# Patient Record
Sex: Female | Born: 1950
Health system: Southern US, Community
[De-identification: ages and names within clinical notes are randomized; demographics above are authoritative.]

## PROBLEM LIST (undated history)

## (undated) DIAGNOSIS — N289 Disorder of kidney and ureter, unspecified: Secondary | ICD-10-CM

## (undated) DIAGNOSIS — I4891 Unspecified atrial fibrillation: Secondary | ICD-10-CM

## (undated) DIAGNOSIS — C801 Malignant (primary) neoplasm, unspecified: Secondary | ICD-10-CM

## (undated) DIAGNOSIS — E7800 Pure hypercholesterolemia, unspecified: Secondary | ICD-10-CM

## (undated) DIAGNOSIS — I509 Heart failure, unspecified: Secondary | ICD-10-CM

## (undated) DIAGNOSIS — I10 Essential (primary) hypertension: Secondary | ICD-10-CM

## (undated) DIAGNOSIS — I2089 Other forms of angina pectoris: Secondary | ICD-10-CM

## (undated) DIAGNOSIS — I82409 Acute embolism and thrombosis of unspecified deep veins of unspecified lower extremity: Secondary | ICD-10-CM

## (undated) DIAGNOSIS — E119 Type 2 diabetes mellitus without complications: Secondary | ICD-10-CM

## (undated) DIAGNOSIS — I208 Other forms of angina pectoris: Secondary | ICD-10-CM

## (undated) DIAGNOSIS — I2699 Other pulmonary embolism without acute cor pulmonale: Secondary | ICD-10-CM

## (undated) DIAGNOSIS — I639 Cerebral infarction, unspecified: Secondary | ICD-10-CM

## (undated) HISTORY — DX: Acute embolism and thrombosis of unspecified deep veins of unspecified lower extremity: I82.409

## (undated) HISTORY — DX: Pure hypercholesterolemia, unspecified: E78.00

## (undated) HISTORY — DX: Other forms of angina pectoris: I20.8

## (undated) HISTORY — DX: Other forms of angina pectoris: I20.89

## (undated) HISTORY — PX: BREAST SURGERY: SHX581

## (undated) HISTORY — PX: PARTIAL HYSTERECTOMY: SHX80

## (undated) HISTORY — DX: Cerebral infarction, unspecified: I63.9

## (undated) HISTORY — DX: Unspecified atrial fibrillation: I48.91

## (undated) HISTORY — DX: Other pulmonary embolism without acute cor pulmonale: I26.99

---

## 2015-04-12 HISTORY — PX: MASTECTOMY: SHX3

## 2016-04-23 ENCOUNTER — Encounter (HOSPITAL_COMMUNITY): Payer: Self-pay

## 2016-04-23 DIAGNOSIS — Z79899 Other long term (current) drug therapy: Secondary | ICD-10-CM

## 2016-04-23 DIAGNOSIS — E1165 Type 2 diabetes mellitus with hyperglycemia: Secondary | ICD-10-CM | POA: Diagnosis present

## 2016-04-23 DIAGNOSIS — Z9010 Acquired absence of unspecified breast and nipple: Secondary | ICD-10-CM

## 2016-04-23 DIAGNOSIS — I509 Heart failure, unspecified: Secondary | ICD-10-CM | POA: Diagnosis present

## 2016-04-23 DIAGNOSIS — I130 Hypertensive heart and chronic kidney disease with heart failure and stage 1 through stage 4 chronic kidney disease, or unspecified chronic kidney disease: Secondary | ICD-10-CM | POA: Diagnosis present

## 2016-04-23 DIAGNOSIS — I82401 Acute embolism and thrombosis of unspecified deep veins of right lower extremity: Secondary | ICD-10-CM | POA: Diagnosis present

## 2016-04-23 DIAGNOSIS — I2602 Saddle embolus of pulmonary artery with acute cor pulmonale: Principal | ICD-10-CM | POA: Diagnosis present

## 2016-04-23 DIAGNOSIS — Z7982 Long term (current) use of aspirin: Secondary | ICD-10-CM

## 2016-04-23 DIAGNOSIS — N189 Chronic kidney disease, unspecified: Secondary | ICD-10-CM | POA: Diagnosis present

## 2016-04-23 DIAGNOSIS — R000 Tachycardia, unspecified: Secondary | ICD-10-CM | POA: Diagnosis present

## 2016-04-23 DIAGNOSIS — Z9889 Other specified postprocedural states: Secondary | ICD-10-CM

## 2016-04-23 DIAGNOSIS — E871 Hypo-osmolality and hyponatremia: Secondary | ICD-10-CM | POA: Diagnosis present

## 2016-04-23 DIAGNOSIS — Z8249 Family history of ischemic heart disease and other diseases of the circulatory system: Secondary | ICD-10-CM

## 2016-04-23 DIAGNOSIS — Z9221 Personal history of antineoplastic chemotherapy: Secondary | ICD-10-CM

## 2016-04-23 DIAGNOSIS — E1122 Type 2 diabetes mellitus with diabetic chronic kidney disease: Secondary | ICD-10-CM | POA: Diagnosis present

## 2016-04-23 DIAGNOSIS — E785 Hyperlipidemia, unspecified: Secondary | ICD-10-CM | POA: Diagnosis present

## 2016-04-23 DIAGNOSIS — Z853 Personal history of malignant neoplasm of breast: Secondary | ICD-10-CM

## 2016-04-23 NOTE — ED Triage Notes (Signed)
Pt states that several months ago she had breast cancer and recent mastectomy and chemo treatments. Pt was recently in [place] last week pt states that she had loss of function on the r side of her body and has not started to gain that back and today her daughter picked her up from airport today and pt having difficulty speaking. Neuro intact.

## 2016-04-24 ENCOUNTER — Emergency Department (HOSPITAL_COMMUNITY): Payer: Medicare Other

## 2016-04-24 ENCOUNTER — Encounter (HOSPITAL_COMMUNITY): Payer: Self-pay | Admitting: Internal Medicine

## 2016-04-24 ENCOUNTER — Other Ambulatory Visit (HOSPITAL_COMMUNITY): Payer: Self-pay

## 2016-04-24 ENCOUNTER — Inpatient Hospital Stay (HOSPITAL_COMMUNITY): Payer: Medicare Other

## 2016-04-24 ENCOUNTER — Inpatient Hospital Stay (HOSPITAL_COMMUNITY): Payer: Self-pay

## 2016-04-24 ENCOUNTER — Inpatient Hospital Stay (HOSPITAL_COMMUNITY)
Admission: EM | Admit: 2016-04-24 | Discharge: 2016-05-03 | DRG: 175 | Disposition: A | Discharge status: Home / Self Care | Payer: Medicare Other | Attending: Internal Medicine | Admitting: Internal Medicine

## 2016-04-24 DIAGNOSIS — R29810 Facial weakness: Secondary | ICD-10-CM | POA: Diagnosis not present

## 2016-04-24 DIAGNOSIS — L8990 Pressure ulcer of unspecified site, unspecified stage: Secondary | ICD-10-CM | POA: Insufficient documentation

## 2016-04-24 DIAGNOSIS — R05 Cough: Secondary | ICD-10-CM | POA: Diagnosis not present

## 2016-04-24 DIAGNOSIS — R4781 Slurred speech: Secondary | ICD-10-CM | POA: Diagnosis not present

## 2016-04-24 DIAGNOSIS — Z9010 Acquired absence of unspecified breast and nipple: Secondary | ICD-10-CM | POA: Diagnosis not present

## 2016-04-24 DIAGNOSIS — R000 Tachycardia, unspecified: Secondary | ICD-10-CM | POA: Diagnosis not present

## 2016-04-24 DIAGNOSIS — I130 Hypertensive heart and chronic kidney disease with heart failure and stage 1 through stage 4 chronic kidney disease, or unspecified chronic kidney disease: Secondary | ICD-10-CM | POA: Diagnosis not present

## 2016-04-24 DIAGNOSIS — E785 Hyperlipidemia, unspecified: Secondary | ICD-10-CM | POA: Diagnosis not present

## 2016-04-24 DIAGNOSIS — I82401 Acute embolism and thrombosis of unspecified deep veins of right lower extremity: Secondary | ICD-10-CM | POA: Diagnosis not present

## 2016-04-24 DIAGNOSIS — E1165 Type 2 diabetes mellitus with hyperglycemia: Secondary | ICD-10-CM | POA: Diagnosis not present

## 2016-04-24 DIAGNOSIS — R2990 Unspecified symptoms and signs involving the nervous system: Secondary | ICD-10-CM | POA: Diagnosis not present

## 2016-04-24 DIAGNOSIS — I2692 Saddle embolus of pulmonary artery without acute cor pulmonale: Secondary | ICD-10-CM | POA: Diagnosis not present

## 2016-04-24 DIAGNOSIS — Z853 Personal history of malignant neoplasm of breast: Secondary | ICD-10-CM

## 2016-04-24 DIAGNOSIS — E1122 Type 2 diabetes mellitus with diabetic chronic kidney disease: Secondary | ICD-10-CM | POA: Diagnosis not present

## 2016-04-24 DIAGNOSIS — I639 Cerebral infarction, unspecified: Secondary | ICD-10-CM | POA: Diagnosis present

## 2016-04-24 DIAGNOSIS — Z9221 Personal history of antineoplastic chemotherapy: Secondary | ICD-10-CM | POA: Diagnosis not present

## 2016-04-24 DIAGNOSIS — N189 Chronic kidney disease, unspecified: Secondary | ICD-10-CM | POA: Diagnosis not present

## 2016-04-24 DIAGNOSIS — Z9889 Other specified postprocedural states: Secondary | ICD-10-CM | POA: Diagnosis not present

## 2016-04-24 DIAGNOSIS — I509 Heart failure, unspecified: Secondary | ICD-10-CM | POA: Diagnosis not present

## 2016-04-24 DIAGNOSIS — Z7982 Long term (current) use of aspirin: Secondary | ICD-10-CM | POA: Diagnosis not present

## 2016-04-24 DIAGNOSIS — E871 Hypo-osmolality and hyponatremia: Secondary | ICD-10-CM | POA: Diagnosis not present

## 2016-04-24 DIAGNOSIS — Z8249 Family history of ischemic heart disease and other diseases of the circulatory system: Secondary | ICD-10-CM | POA: Diagnosis not present

## 2016-04-24 DIAGNOSIS — J189 Pneumonia, unspecified organism: Secondary | ICD-10-CM

## 2016-04-24 DIAGNOSIS — E119 Type 2 diabetes mellitus without complications: Secondary | ICD-10-CM | POA: Diagnosis not present

## 2016-04-24 DIAGNOSIS — I2699 Other pulmonary embolism without acute cor pulmonale: Secondary | ICD-10-CM | POA: Diagnosis not present

## 2016-04-24 DIAGNOSIS — I2602 Saddle embolus of pulmonary artery with acute cor pulmonale: Secondary | ICD-10-CM | POA: Diagnosis not present

## 2016-04-24 DIAGNOSIS — I6789 Other cerebrovascular disease: Secondary | ICD-10-CM | POA: Diagnosis not present

## 2016-04-24 DIAGNOSIS — Z79899 Other long term (current) drug therapy: Secondary | ICD-10-CM | POA: Diagnosis not present

## 2016-04-24 HISTORY — DX: Malignant (primary) neoplasm, unspecified: C80.1

## 2016-04-24 HISTORY — DX: Disorder of kidney and ureter, unspecified: N28.9

## 2016-04-24 HISTORY — DX: Heart failure, unspecified: I50.9

## 2016-04-24 HISTORY — DX: Type 2 diabetes mellitus without complications: E11.9

## 2016-04-24 LAB — ECHOCARDIOGRAM COMPLETE BUBBLE STUDY
HEIGHTINCHES: 63 in
WEIGHTICAEL: 3118.4 [oz_av]

## 2016-04-24 LAB — I-STAT CHEM 8, ED
BUN: 14 mg/dL (ref 6–20)
CHLORIDE: 101 mmol/L (ref 101–111)
Calcium, Ion: 1.24 mmol/L (ref 1.15–1.40)
Creatinine, Ser: 0.9 mg/dL (ref 0.44–1.00)
Glucose, Bld: 334 mg/dL — ABNORMAL HIGH (ref 65–99)
HEMATOCRIT: 30 % — AB (ref 36.0–46.0)
Hemoglobin: 10.2 g/dL — ABNORMAL LOW (ref 12.0–15.0)
POTASSIUM: 3.9 mmol/L (ref 3.5–5.1)
SODIUM: 130 mmol/L — AB (ref 135–145)
TCO2: 19 mmol/L (ref 0–100)

## 2016-04-24 LAB — GLUCOSE, CAPILLARY
GLUCOSE-CAPILLARY: 210 mg/dL — AB (ref 65–99)
GLUCOSE-CAPILLARY: 263 mg/dL — AB (ref 65–99)
Glucose-Capillary: 190 mg/dL — ABNORMAL HIGH (ref 65–99)
Glucose-Capillary: 175 mg/dL — ABNORMAL HIGH (ref 65–99)
Glucose-Capillary: 156 mg/dL — ABNORMAL HIGH (ref 65–99)

## 2016-04-24 LAB — COMPREHENSIVE METABOLIC PANEL
ALT: 22 U/L (ref 14–54)
AST: 30 U/L (ref 15–41)
Albumin: 2.7 g/dL — ABNORMAL LOW (ref 3.5–5.0)
Alkaline Phosphatase: 83 U/L (ref 38–126)
Anion gap: 11 (ref 5–15)
BUN: 14 mg/dL (ref 6–20)
CHLORIDE: 100 mmol/L — AB (ref 101–111)
CO2: 19 mmol/L — AB (ref 22–32)
CREATININE: 1.06 mg/dL — AB (ref 0.44–1.00)
Calcium: 9.5 mg/dL (ref 8.9–10.3)
GFR calc Af Amer: >60 mL/min (ref >60)
GFR, EST NON AFRICAN AMERICAN: 53 mL/min — AB (ref >60)
Glucose, Bld: 324 mg/dL — ABNORMAL HIGH (ref 65–99)
Potassium: 4 mmol/L (ref 3.5–5.1)
SODIUM: 130 mmol/L — AB (ref 135–145)
Total Bilirubin: 0.6 mg/dL (ref 0.3–1.2)
Total Protein: 6.8 g/dL (ref 6.5–8.1)

## 2016-04-24 LAB — DIFFERENTIAL
BASOS ABS: 0 10*3/uL (ref 0.0–0.1)
BASOS PCT: 0 %
Eosinophils Absolute: 0.1 10*3/uL (ref 0.0–0.7)
Eosinophils Relative: 1 %
LYMPHS PCT: 16 %
Lymphs Abs: 1.4 10*3/uL (ref 0.7–4.0)
MONOS PCT: 11 %
Monocytes Absolute: 1 10*3/uL (ref 0.1–1.0)
NEUTROS ABS: 6.3 10*3/uL (ref 1.7–7.7)
Neutrophils Relative %: 72 %

## 2016-04-24 LAB — APTT: APTT: 40 s — AB (ref 24–36)

## 2016-04-24 LAB — CBC
HEMATOCRIT: 29.3 % — AB (ref 36.0–46.0)
Hemoglobin: 10.1 g/dL — ABNORMAL LOW (ref 12.0–15.0)
MCH: 30.8 pg (ref 26.0–34.0)
MCHC: 34.5 g/dL (ref 30.0–36.0)
MCV: 89.3 fL (ref 78.0–100.0)
Platelets: 216 10*3/uL (ref 150–400)
RBC: 3.28 MIL/uL — AB (ref 3.87–5.11)
RDW: 15.5 % (ref 11.5–15.5)
WBC: 8.7 10*3/uL (ref 4.0–10.5)

## 2016-04-24 LAB — MRSA PCR SCREENING: MRSA BY PCR: NEGATIVE

## 2016-04-24 LAB — CBG MONITORING, ED: Glucose-Capillary: 300 mg/dL — ABNORMAL HIGH (ref 65–99)

## 2016-04-24 LAB — PROTIME-INR
INR: 1.21
Prothrombin Time: 15.4 seconds — ABNORMAL HIGH (ref 11.4–15.2)

## 2016-04-24 LAB — I-STAT TROPONIN, ED: Troponin i, poc: 0.25 ng/mL (ref 0.00–0.08)

## 2016-04-24 MED ORDER — SODIUM CHLORIDE 0.9 % IV BOLUS (SEPSIS)
500.0000 mL | Freq: Once | INTRAVENOUS | Status: AC
  Administered 2016-04-24: 500 mL via INTRAVENOUS

## 2016-04-24 MED ORDER — ACETAMINOPHEN 650 MG RE SUPP: 650.0000 mg | RECTAL | Status: DC | PRN

## 2016-04-24 MED ORDER — VANCOMYCIN HCL 10 G IV SOLR
1500.0000 mg | Freq: Once | INTRAVENOUS | Status: AC
  Administered 2016-04-24: 1500 mg via INTRAVENOUS
  Filled 2016-04-24: qty 1500

## 2016-04-24 MED ORDER — ASPIRIN 81 MG PO CHEW
324.0000 mg | CHEWABLE_TABLET | Freq: Once | ORAL | Status: AC
  Administered 2016-04-24: 324 mg via ORAL
  Filled 2016-04-24: qty 4

## 2016-04-24 MED ORDER — ENOXAPARIN SODIUM 100 MG/ML ~~LOC~~ SOLN
90.0000 mg | Freq: Once | SUBCUTANEOUS | Status: AC
  Administered 2016-04-24: 90 mg via SUBCUTANEOUS
  Filled 2016-04-24: qty 1

## 2016-04-24 MED ORDER — STROKE: EARLY STAGES OF RECOVERY BOOK
Freq: Once | Status: AC
  Administered 2016-04-30: 18:00:00
  Filled 2016-04-24: qty 1

## 2016-04-24 MED ORDER — IOPAMIDOL (ISOVUE-370) INJECTION 76%
INTRAVENOUS | Status: AC
  Administered 2016-04-24: 75 mL
  Filled 2016-04-24: qty 100

## 2016-04-24 MED ORDER — WHITE PETROLATUM GEL
Status: AC
  Filled 2016-04-24: qty 1

## 2016-04-24 MED ORDER — ACETAMINOPHEN 325 MG PO TABS
650.0000 mg | ORAL_TABLET | ORAL | Status: DC | PRN
  Filled 2016-04-24: qty 2

## 2016-04-24 MED ORDER — PIPERACILLIN-TAZOBACTAM 3.375 G IVPB 30 MIN
3.3750 g | Freq: Once | INTRAVENOUS | Status: AC
  Administered 2016-04-24: 3.375 g via INTRAVENOUS
  Filled 2016-04-24: qty 50

## 2016-04-24 MED ORDER — INSULIN ASPART 100 UNIT/ML ~~LOC~~ SOLN
0.0000 [IU] | SUBCUTANEOUS | Status: DC
Start: 2016-04-24 — End: 2016-05-02
  Administered 2016-04-24 (×3): 2 [IU] via SUBCUTANEOUS
  Administered 2016-04-24: 5 [IU] via SUBCUTANEOUS
  Administered 2016-04-25: 3 [IU] via SUBCUTANEOUS
  Administered 2016-04-25: 7 [IU] via SUBCUTANEOUS
  Administered 2016-04-25: 1 [IU] via SUBCUTANEOUS
  Administered 2016-04-25: 3 [IU] via SUBCUTANEOUS
  Administered 2016-04-26 (×2): 2 [IU] via SUBCUTANEOUS
  Administered 2016-04-26 – 2016-04-27 (×3): 1 [IU] via SUBCUTANEOUS
  Administered 2016-04-27 (×2): 2 [IU] via SUBCUTANEOUS
  Administered 2016-04-27: 3 [IU] via SUBCUTANEOUS
  Administered 2016-04-27: 5 [IU] via SUBCUTANEOUS
  Administered 2016-04-28: 1 [IU] via SUBCUTANEOUS
  Administered 2016-04-28: 2 [IU] via SUBCUTANEOUS
  Administered 2016-04-28: 5 [IU] via SUBCUTANEOUS
  Administered 2016-04-28: 1 [IU] via SUBCUTANEOUS
  Administered 2016-04-28: 3 [IU] via SUBCUTANEOUS
  Administered 2016-04-28: 2 [IU] via SUBCUTANEOUS
  Administered 2016-04-28: 3 [IU] via SUBCUTANEOUS
  Administered 2016-04-29 (×2): 1 [IU] via SUBCUTANEOUS
  Administered 2016-04-29: 3 [IU] via SUBCUTANEOUS
  Administered 2016-04-29: 2 [IU] via SUBCUTANEOUS
  Administered 2016-04-30: 1 [IU] via SUBCUTANEOUS
  Administered 2016-04-30: 2 [IU] via SUBCUTANEOUS
  Administered 2016-04-30 – 2016-05-01 (×3): 1 [IU] via SUBCUTANEOUS

## 2016-04-24 MED ORDER — ACETAMINOPHEN 160 MG/5ML PO SOLN: 650.0000 mg | ORAL | Status: DC | PRN

## 2016-04-24 MED ORDER — SODIUM CHLORIDE 0.9 % IV SOLN
INTRAVENOUS | Status: DC
Start: 2016-04-24 — End: 2016-04-25
  Administered 2016-04-24 (×2): via INTRAVENOUS

## 2016-04-24 MED ORDER — ENOXAPARIN SODIUM 100 MG/ML ~~LOC~~ SOLN
90.0000 mg | Freq: Two times a day (BID) | SUBCUTANEOUS | Status: DC
  Administered 2016-04-24 – 2016-04-27 (×6): 90 mg via SUBCUTANEOUS
  Filled 2016-04-24 (×7): qty 1

## 2016-04-24 NOTE — Consult Note (Signed)
PULMONARY / CRITICAL CARE MEDICINE   Name: Carrie Watts MRN: [id number] DOB: 06-19-1950    ADMISSION DATE:  04/24/2016 CONSULTATION DATE:  04/24/2016  REFERRING MD:  Alcario Drought  CHIEF COMPLAINT:  Dyspnea  HISTORY OF PRESENT ILLNESS:   66 y/o female came to the [facility] ED on 1/14 with cough, dyspnea and chest pain in the setting of two weeks of R sided facial droop and slurred speech after returning from [place] on 1/13.  Noted to have a large PE on CT angiogram so PCCM was consulted for further evaluation.  She is being evaluated by neurology for a possible stroke.  She has received lovenox for the PE.  Neurology has ordered an MRI of the brain.   She said that her dyspnea started about 2 weeks prior to admission.  She had some chills and a cough with mucus production, but no fever.  She currently feels OK, but is still coughing. She has breast cancer and has been receiving chemotherapy recently.  PAST MEDICAL HISTORY :  She  has a past medical history of Cancer [facility]); CHF (congestive heart failure) ([place]); Diabetes mellitus without complication ([place]); and Renal disorder.  PAST SURGICAL HISTORY: She  has a past surgical history that includes Breast surgery.  Allergies  Allergen Reactions  . Dust Mite Extract     No current facility-administered medications on file prior to encounter.    No current outpatient prescriptions on file prior to encounter.    FAMILY HISTORY:  Her indicated that the status of her mother is unknown. She indicated that the status of her neg hx is unknown.    SOCIAL HISTORY: She  reports that she has never smoked. She has never used smokeless tobacco. She reports that she does not drink alcohol.  REVIEW OF SYSTEMS:   Gen: Denies fever, chills, weight change, fatigue, night sweats HEENT: Denies blurred vision, double vision, hearing loss, tinnitus, sinus congestion, rhinorrhea, sore throat, neck stiffness, dysphagia PULM: per HPI CV: Denies chest pain, edema,  orthopnea, paroxysmal nocturnal dyspnea, palpitations GI: Denies abdominal pain, nausea, vomiting, diarrhea, hematochezia, melena, constipation, change in bowel habits GU: Denies dysuria, hematuria, polyuria, oliguria, urethral discharge Endocrine: Denies hot or cold intolerance, polyuria, polyphagia or appetite change Derm: Denies rash, dry skin, scaling or peeling skin change Heme: Denies easy bruising, bleeding, bleeding gums Neuro: Denies headache, numbness, weakness, slurred speech, loss of memory or consciousness   SUBJECTIVE:  As above  VITAL SIGNS: BP (!) 138/104 (BP Location: Right Leg)   Pulse (!) 104   Temp 98 F (36.7 C) (Oral)   Resp (!) 29   Ht 5\' 3"  (1.6 m)   Wt 88.4 kg (194 lb 14.4 oz)   SpO2 93%   BMI 34.52 kg/m   HEMODYNAMICS:    VENTILATOR SETTINGS:    INTAKE / OUTPUT: I/O last 3 completed shifts: In: 526.3 [I.V.:26.3; IV Piggyback:500] Out: 200 [Urine:200]  PHYSICAL EXAMINATION: General:  Resting comfortably Neuro:  Awake, alert, moves L more than R HEENT:  NCAT, OP clear Cardiovascular:  RRR, no mgr Lungs:  CTA B, normal effort Abdomen:  BS+, soft, nontender Musculoskeletal:  Normal bulk and tone Skin:  No rash or skin breakdown  LABS:  BMET  Recent Labs Lab 04/24/16 0007 04/24/16 0027  NA 130* 130*  K 4.0 3.9  CL 100* 101  CO2 19*  --   BUN 14 14  CREATININE 1.06* 0.90  GLUCOSE 324* 334*    Electrolytes  Recent Labs Lab 04/24/16 0007  CALCIUM 9.5    CBC  Recent Labs Lab 04/24/16 0007 04/24/16 0027  WBC 8.7  --   HGB 10.1* 10.2*  HCT 29.3* 30.0*  PLT 216  --     Coag's  Recent Labs Lab 04/24/16 0007  APTT 40*  INR 1.21    Sepsis Markers No results for input(s): LATICACIDVEN, PROCALCITON, O2SATVEN in the last 168 hours.  ABG No results for input(s): PHART, PCO2ART, PO2ART in the last 168 hours.  Liver Enzymes  Recent Labs Lab 04/24/16 0007  AST 30  ALT 22  ALKPHOS 83  BILITOT 0.6  ALBUMIN  2.7*    Cardiac Enzymes No results for input(s): TROPONINI, PROBNP in the last 168 hours.  Glucose  Recent Labs Lab 04/24/16 0323 04/24/16 0643 04/24/16 0831  GLUCAP 300* 210* 190*    Imaging Dg Chest 2 View  Result Date: 04/24/2016 CLINICAL DATA:  Cough. History of hypertension and CHF. History of breast cancer. Patient has had loss of function on the right side over body and has difficulty speaking. EXAM: CHEST  2 VIEW COMPARISON:  None. FINDINGS: Normal heart size and pulmonary vascularity. No focal airspace disease or consolidation in the lungs. No blunting of costophrenic angles. No pneumothorax. Mediastinal contours appear intact. Surgical clips in the left axilla. Degenerative changes in the spine. IMPRESSION: No active cardiopulmonary disease. Electronically Signed   By: Lucienne Capers M.D.   On: 04/24/2016 01:33   Ct Head Wo Contrast  Result Date: 04/24/2016 CLINICAL DATA:  Acute onset of slurred speech and bilateral leg weakness. Initial encounter. EXAM: CT HEAD WITHOUT CONTRAST TECHNIQUE: Contiguous axial images were obtained from the base of the skull through the vertex without intravenous contrast. COMPARISON:  None. FINDINGS: Brain: No evidence of acute infarction, hemorrhage, hydrocephalus, extra-axial collection or mass lesion/mass effect. Prominence of the ventricles and sulci reflects mild cortical volume loss. Scattered periventricular subcortical white matter change likely reflects small vessel ischemic microangiopathy. A small chronic lacunar infarct is noted at the right basal ganglia. The brainstem and fourth ventricle are within normal limits. The cerebral hemispheres demonstrate grossly normal gray-white differentiation. No mass effect or midline shift is seen. Vascular: No hyperdense vessel or unexpected calcification. Skull: There is no evidence of fracture; visualized osseous structures are unremarkable in appearance. Sinuses/Orbits: The orbits are within normal  limits. The paranasal sinuses and mastoid air cells are well-aerated. Other: No significant soft tissue abnormalities are seen. IMPRESSION: 1. No acute intracranial pathology seen on CT. 2. Mild cortical volume loss and scattered small vessel ischemic microangiopathy. 3. Small chronic lacunar infarct at the right basal ganglia. Electronically Signed   By: Garald Balding M.D.   On: 04/24/2016 01:54   Ct Angio Chest Pe W And/or Wo Contrast  Result Date: 04/24/2016 CLINICAL DATA:  Cough, chest pain, and shortness of breath for 2 weeks. History of breast cancer post left mastectomy. Chemotherapy. Multiple recent strokes. Diabetes. EXAM: CT ANGIOGRAPHY CHEST WITH CONTRAST TECHNIQUE: Multidetector CT imaging of the chest was performed using the standard protocol during bolus administration of intravenous contrast. Multiplanar CT image reconstructions and MIPs were obtained to evaluate the vascular anatomy. CONTRAST:  100 mL Isovue 370 COMPARISON:  None. FINDINGS: Cardiovascular: Filling defects demonstrated in the main and bilateral lobar pulmonary artery is extending into bilateral upper and lower lobe branches consistent with saddle embolus with moderately large clot burden. The RV to LV ratio is elevated at 1.19, suggesting risk of right heart strain. No pericardial effusion. Dilated ascending thoracic aorta at  3.5 cm. No dissection. Scattered calcification. Mediastinum/Nodes: Small esophageal hiatal hernia. Esophagus is decompressed. No significant lymphadenopathy in the chest. Lungs/Pleura: Evaluation is limited due to motion artifact. There patchy nodular ground-glass infiltrative changes demonstrated centrally in the lungs, in the lung bases, and scattered throughout the upper lungs. This may be due to multifocal pneumonia, edema, or infarcts. Metastatic disease would be a less likely consideration. No pleural effusions. No pneumothorax. Upper Abdomen: No acute abnormality. Musculoskeletal: Postoperative  changes consistent with left mastectomy and surgical clips in the left axilla. No destructive bone lesions. Review of the MIP images confirms the above findings. IMPRESSION: Positive examination for pulmonary embolus, including saddle embolus and bilateral lobar and segmental pulmonary emboli. Moderately large clot burden. Positive for acute PE with CT evidence of right heart strain (RV/LV Ratio = 1.19) consistent with at least submassive (intermediate risk) PE. The presence of right heart strain has been associated with an increased risk of morbidity and mortality. Please activate Code PE by paging [phone]. Patchy nodular ground-glass infiltrative changes in the lungs may be due to multifocal pneumonia, edema, or infarcts. Metastatic disease would be a less likely consideration but due to history of primary carcinoma, non-contrast chest CT at 3-6 months is recommended. If nodules persist, subsequent management will be based upon the most suspicious nodule(s). This recommendation follows the consensus statement: Guidelines for Management of Incidental Pulmonary Nodules Detected on CT Images: From the Fleischner Society 2017; Radiology 2017; 284:228-243. These results were called by telephone at the time of interpretation on 04/24/2016 at 4:26 am to Dr. Deno Etienne , who verbally acknowledged these results. Electronically Signed   By: Lucienne Capers M.D.   On: 04/24/2016 04:32     STUDIES:  CT chest 1/14> CT angiogram> large PE, bilateral, patchy ground glass bilaterally  CULTURES:   ANTIBIOTICS:   SIGNIFICANT EVENTS:   LINES/TUBES:   DISCUSSION: 66 y/o female with breast cancer admitted with a PE on 1/14 and findings worrisome for a stroke.  Both of these appear to be sub acute as she has had symptoms for about 2-3 weeks.  Concern exists for paradoxical embolism, PFO.  ASSESSMENT / PLAN:  PULMONARY A: Sub-acute pulmonary embolism No evidence of right heart strain, so no role for  thrombolysis P:   Agree with lovenox for anticoagulation in setting of breast cancer assuming the stroke team doesn't feel this increases risk of cerebral hemorrhage excessively Will need treatment for 3-6 months Agree with echocardiogram F/u with heme-onc  PCCM will sign off   Roselie Awkward, MD [place] P[facility]M Pager: [phone] Cell: [phone] After 3pm or if no response, call [phone]  04/24/2016, 9:07 AM

## 2016-04-24 NOTE — ED Notes (Signed)
Notified nurse first results from istat troponin. 

## 2016-04-24 NOTE — Progress Notes (Signed)
eLink Physician-Brief Progress Note Patient Name: Carrie Watts DOB: 08-08-1950 MRN: [id number]   Date of Service  04/24/2016  HPI/Events of Note  66 year old African female with diagnosis of breast cancer post mastectomy and chemotherapy some months ago. Presented to the ED with difficulty speaking after being picked up from [place]. Patient also endorsed chest discomfort, cough, and shortness of breath. Recently had traveled to [place]. ED physician denies any syncope. Patient has normal saturation on room air. Currently normotensive and not tachycardic or tachypneic. Troponin I mildly elevated at 0.25. RV/LV ratio 1.19. Significant clot burden with saddle embolus and filling defects with an main and bilateral lobar pulmonary  arteries.  Patient being evaluated by neurology.  eICU Interventions  1. Recommended admission to hospitalist service 2. Recommend therapeutic anticoagulation with heparin drip once evaluated by neurology 3. Recommend trending troponin I 4. Recommend complete echocardiogram with contrast imaging 5. Recommend telemetry monitoring and continuous pulse oximetry 6. Pulmonologist will evaluate the patient later this morning/today     Intervention Category Evaluation Type: New Patient Evaluation  Tera Partridge 04/24/2016, 4:50 AM

## 2016-04-24 NOTE — ED Notes (Signed)
Positive troponin, to go to room next.

## 2016-04-24 NOTE — Progress Notes (Signed)
STROKE TEAM PROGRESS NOTE   HISTORY OF PRESENT ILLNESS (per record) Carrie Watts is an 66 y.o. female who presents with a one week history of right sided weakness. Her symptoms began last Monday while she was in [place] receiving chemotherapy treatments for her breast cancer. She states that her daughter noticed difficulty speaking when she picked the patient up from the airport on Saturday. The patient denies vision changes, headache, neck pain, chest pain, abdominal pain or limb pain; however, on exam she states that movement of her lower extremities is painful. She gives short answers to questions lacking in detail and there is difficulty in obtaining a detailed description of neurological symptoms and history from her. She stated to me that she was in [place] but informed the ED attending that she was recently in [place].   CT head was negative. CTA of chest demonstrated a large clot burden PE and saddle PE, as well as ground glass areas that radiology feels may be PNA versus infarct versus malignancy spread, per report.   Her PMHx includes breast CA, CHF and DM.    SUBJECTIVE (INTERVAL HISTORY) No family is at the bedside.  Overall she feels her condition is stable. She stated that had speech difficulty for more than a week. However, her speech more of stuttering instead of aphasia or slurry. Her RUE and RLE muscle strength symmetrical to the left. MRI and MRA pending. Found to have saddle PE and on lovenox injection.    OBJECTIVE Temp:  [97.8 F (36.6 C)-99.5 F (37.5 C)] 98 F (36.7 C) (01/14 0829) Pulse Rate:  [78-112] 78 (01/14 1000) Cardiac Rhythm: Normal sinus rhythm (01/14 0800) Resp:  [15-29] 18 (01/14 1000) BP: (101-157)/(48-104) 120/65 (01/14 1000) SpO2:  [88 %-100 %] 93 % (01/14 1000) Weight:  [194 lb 14.4 oz (88.4 kg)] 194 lb 14.4 oz (88.4 kg) (01/14 0603)  CBC:   Recent Labs Lab 04/24/16 0007 04/24/16 0027  WBC 8.7  --   NEUTROABS 6.3  --   HGB 10.1* 10.2*   HCT 29.3* 30.0*  MCV 89.3  --   PLT 216  --     Basic Metabolic Panel:   Recent Labs Lab 04/24/16 0007 04/24/16 0027  NA 130* 130*  K 4.0 3.9  CL 100* 101  CO2 19*  --   GLUCOSE 324* 334*  BUN 14 14  CREATININE 1.06* 0.90  CALCIUM 9.5  --     Lipid Panel: No results found for: CHOL, TRIG, HDL, CHOLHDL, VLDL, LDLCALC HgbA1c: No results found for: HGBA1C Urine Drug Screen: No results found for: LABOPIA, COCAINSCRNUR, LABBENZ, AMPHETMU, THCU, LABBARB    IMAGING I have personally reviewed the radiological images below and agree with the radiology interpretations.  Dg Chest 2 View 04/24/2016 No active cardiopulmonary disease.   Ct Head Wo Contrast 04/24/2016 1. No acute intracranial pathology seen on CT.  2. Mild cortical volume loss and scattered small vessel ischemic microangiopathy.  3. Small chronic lacunar infarct at the right basal ganglia.   Ct Angio Chest Pe W And/or Wo Contrast 04/24/2016 Positive examination for pulmonary embolus, including saddle embolus and bilateral lobar and segmental pulmonary emboli. Moderately large clot burden. Positive for acute PE with CT evidence of right heart strain (RV/LV Ratio = 1.19) consistent with at least submassive (intermediate risk) PE. The presence of right heart strain has been associated with an increased risk of morbidity and mortality. Please activate Code PE by paging [phone].  Patchy nodular ground-glass infiltrative changes in the  lungs may be due to multifocal pneumonia, edema, or infarcts. Metastatic disease would be a less likely consideration but due to history of primary carcinoma, non-contrast chest CT at 3-6 months is recommended.  If nodules persist, subsequent management will be based upon the most suspicious nodule(s).   MRI / MRA Head  - pending  TCD bubble study pending  CUS pending  TTE pending  LE venous doppler pending   PHYSICAL EXAM  Temp:  [97.8 F (36.6 C)-99.5 F (37.5 C)] 98 F  (36.7 C) (01/14 0829) Pulse Rate:  [78-112] 78 (01/14 1000) Resp:  [15-29] 18 (01/14 1000) BP: (101-157)/(48-104) 120/65 (01/14 1000) SpO2:  [88 %-100 %] 93 % (01/14 1000) Weight:  [194 lb 14.4 oz (88.4 kg)] 194 lb 14.4 oz (88.4 kg) (01/14 0603)  General - Well nourished, well developed, in no apparent distress.  Ophthalmologic - Fundi not visualized due to eye movement.  Cardiovascular - Regular rate and rhythm.  Mental Status -  Level of arousal and orientation to time, place, and person were intact. Language including naming, repetition, comprehension was assessed and found intact, however, significant stuttering speech, concerning for functional speech instead of aphasia. Fund of Knowledge was assessed and was intact.  Cranial Nerves II - XII - II - Visual field intact OU. III, IV, VI - Extraocular movements intact. V - Facial sensation intact bilaterally. VII - Facial movement intact bilaterally. VIII - Hearing & vestibular intact bilaterally. X - Palate elevates symmetrically. XI - Chin turning & shoulder shrug intact bilaterally. XII - Tongue protrusion intact.  Motor Strength - The patient's strength was 4/5 and symmetrical in all extremities and pronator drift was absent.  Bulk was normal and fasciculations were absent.   Motor Tone - Muscle tone was assessed at the neck and appendages and was normal.  Reflexes - The patient's reflexes were 1+ in all extremities and she had no pathological reflexes.  Sensory - Light touch, temperature/pinprick were assessed and were symmetrical.    Coordination - The patient had normal movements in the hands with no ataxia or dysmetria, but slow on movement.  Tremor was absent.  Gait and Station - deferred.   ASSESSMENT/PLAN Ms. Carrie Watts is a 66 y.o. female with history of breast CA, CHF, CKD, and DM  presenting with one week history of right sided weakness and speech difficulties.. She did not receive IV t-PA due to late  presentation.  PE with saddle embolus and right heart strain - ? related to long flight travel vs. Hypercoagulable state secondary to breast cancer  CTA chest showed saddle embolus and bilateral lobar and segmental pulmonary emboli as well as right heart strain  LE venous doppler pending  On lovenox therapeutic dose  Close neuro check   If any neuro changes, consider stat head CT without contrast  Other treatment per primary team  Stroke to rule out - speech more stuttering and no weakness found at this time  MRI - pending  MRA - pending  Carotid Doppler - pending  2D Echo - pending  TCD bubble study pending  LDL - pending  HgbA1c - pending  VTE prophylaxis - Lovenox DIET DYS 3 Room service appropriate? Yes; Fluid consistency: Thin  aspirin 81 mg daily prior to admission, now on Lovenox full dose.   Close neuro check - if neuro changes, stat head CT  Therapy recommendations: pending  Disposition:  Pending  Hypertension  Stable  Permissive hypertension (OK if < 180/105) but gradually normalize in  5-7 days  Long-term BP goal normotensive  Hyperlipidemia  Home meds: No lipid lowering medications PTA  LDL pending, goal < 70  Continue statin at discharge  Diabetes  HgbA1c - pending, goal < 7.0  Uncontrolled  Hyperglycemia  SSI  CBG monitoring  Other Stroke Risk Factors  Advanced age  Obesity, Body mass index is 34.52 kg/m., recommend weight loss, diet and exercise as appropriate   Hx stroke/TIA - right BG old infarct by imaging  Other Active Problems  Family history of clotting disorder  Anemia - 10.2 / 30  hyponatremia - 130  Breast cancer - s/p mastectomy and undergoing chemotherapy  To rule out metastatic disease on chest CT - F/U non contrast chest CT in 3 - 6 months.  Hospital day # 0  This patient is critically ill due to large saddle PE, possible stroke and DVT, DM, hyperglycemia and at significant risk of neurological  worsening, death form heart failure, respiratory failure, hemorrhagic stroke, recurrent stroke, DKA, and seizure. This patient's care requires constant monitoring of vital signs, hemodynamics, respiratory and cardiac monitoring, review of multiple databases, neurological assessment, discussion with family, other specialists and medical decision making of high complexity. I spent 40 minutes of neurocritical care time in the care of this patient.  Rosalin Hawking, MD PhD Stroke Neurology 04/24/2016 10:42 AM   To contact Stroke Continuity provider, please refer to [email or web address] After hours, contact General Neurology

## 2016-04-24 NOTE — H&P (Addendum)
History and Physical    Carrie Watts EHO:[id number] DOB: 28-Jun-1950 DOA: 04/24/2016   PCP: No primary care provider on file. Chief Complaint:  Chief Complaint  Patient presents with  . difficulty speaking    HPI: Carrie Watts is a 67 y.o. female with medical history significant of BRCA of the left breast s/p lumpectomy and chemo.  Patient just flew back to the [place] from [place] yesterday.  She presents to the ED with c/o R sided facial droop, difficulty speaking, chest discomfort, cough, and SOB.  Symptoms onset about 2ish weeks ago, have been persistent since onset, nothing makes them better or worse.  ED Course: CT head negative.  Trop of 0.25 which convinced EDP to get a CTA PE study which demonstrates large clot burden PE and saddle PE!  Also shows ground glass areas that the radiologist hedges may be PNA, may be infarct, or less likely might be malignancy spread given her h/o BRCA and so she needs repeat CT scan in 3-6 months.  Review of Systems: As per HPI otherwise 10 point review of systems negative.    Past Medical History:  Diagnosis Date  . Cancer (HCC)    breast  . CHF (congestive heart failure) ([place])   . Diabetes mellitus without complication ([place])   . Renal disorder     Past Surgical History:  Procedure Laterality Date  . BREAST SURGERY       reports that she has never smoked. She has never used smokeless tobacco. She reports that she does not drink alcohol. Her drug history is not on file.  Allergies  Allergen Reactions  . Dust Mite Extract     Family History  Problem Relation Age of Onset  . Heart attack Mother   . Clotting disorder Neg Hx       Prior to Admission medications   Medication Sig Start Date End Date Taking? Authorizing Provider  aspirin EC 81 MG tablet Take 81 mg by mouth daily.   Yes Historical Provider, MD  omeprazole (PRILOSEC) 20 MG capsule Take 20 mg by mouth daily.   Yes Historical Provider, MD  PRESCRIPTION MEDICATION Take 1  tablet by mouth daily. Blood Pressure   Yes Historical Provider, MD    Physical Exam: Vitals:   04/24/16 0415 04/24/16 0430 04/24/16 0445 04/24/16 0500  BP: 125/67 106/56 139/69   Pulse: 83 82 80   Resp: '15 19 20   '$ Temp:      TempSrc:      SpO2: (!) 88% 92% 95%   Height:    '5\' 2"'$  (1.575 m)      Constitutional: NAD, calm, comfortable Eyes: PERRL, lids and conjunctivae normal ENMT: Mucous membranes are moist. Posterior pharynx clear of any exudate or lesions.Normal dentition.  Neck: normal, supple, no masses, no thyromegaly Respiratory: clear to auscultation bilaterally, no wheezing, no crackles. Normal respiratory effort. No accessory muscle use.  Cardiovascular: Regular rate and rhythm, no murmurs / rubs / gallops. No extremity edema. 2+ pedal pulses. No carotid bruits.  Abdomen: no tenderness, no masses palpated. No hepatosplenomegaly. Bowel sounds positive.  Musculoskeletal: no clubbing / cyanosis. No joint deformity upper and lower extremities. Good ROM, no contractures. Normal muscle tone.  Skin: no rashes, lesions, ulcers. No induration Neurologic: Patient with findings suspicious for broca's aphasia.  LLE weakness, R facial droop Psychiatric: Normal judgment and insight. Alert and oriented x 3. Normal mood.    Labs on Admission: I have personally reviewed following labs and imaging studies  CBC:  Recent Labs Lab 04/24/16 0007 04/24/16 0027  WBC 8.7  --   NEUTROABS 6.3  --   HGB 10.1* 10.2*  HCT 29.3* 30.0*  MCV 89.3  --   PLT 216  --    Basic Metabolic Panel:  Recent Labs Lab 04/24/16 0007 04/24/16 0027  NA 130* 130*  K 4.0 3.9  CL 100* 101  CO2 19*  --   GLUCOSE 324* 334*  BUN 14 14  CREATININE 1.06* 0.90  CALCIUM 9.5  --    GFR: CrCl cannot be calculated (Unknown ideal weight.). Liver Function Tests:  Recent Labs Lab 04/24/16 0007  AST 30  ALT 22  ALKPHOS 83  BILITOT 0.6  PROT 6.8  ALBUMIN 2.7*   No results for input(s): LIPASE,  AMYLASE in the last 168 hours. No results for input(s): AMMONIA in the last 168 hours. Coagulation Profile:  Recent Labs Lab 04/24/16 0007  INR 1.21   Cardiac Enzymes: No results for input(s): CKTOTAL, CKMB, CKMBINDEX, TROPONINI in the last 168 hours. BNP (last 3 results) No results for input(s): PROBNP in the last 8760 hours. HbA1C: No results for input(s): HGBA1C in the last 72 hours. CBG:  Recent Labs Lab 04/24/16 0323  GLUCAP 300*   Lipid Profile: No results for input(s): CHOL, HDL, LDLCALC, TRIG, CHOLHDL, LDLDIRECT in the last 72 hours. Thyroid Function Tests: No results for input(s): TSH, T4TOTAL, FREET4, T3FREE, THYROIDAB in the last 72 hours. Anemia Panel: No results for input(s): VITAMINB12, FOLATE, FERRITIN, TIBC, IRON, RETICCTPCT in the last 72 hours. Urine analysis: No results found for: COLORURINE, APPEARANCEUR, LABSPEC, PHURINE, GLUCOSEU, HGBUR, BILIRUBINUR, KETONESUR, PROTEINUR, UROBILINOGEN, NITRITE, LEUKOCYTESUR Sepsis Labs: '@LABRCNTIP'$ (procalcitonin:4,lacticidven:4) )No results found for this or any previous visit (from the past 240 hour(s)).   Radiological Exams on Admission: Dg Chest 2 View  Result Date: 04/24/2016 CLINICAL DATA:  Cough. History of hypertension and CHF. History of breast cancer. Patient has had loss of function on the right side over body and has difficulty speaking. EXAM: CHEST  2 VIEW COMPARISON:  None. FINDINGS: Normal heart size and pulmonary vascularity. No focal airspace disease or consolidation in the lungs. No blunting of costophrenic angles. No pneumothorax. Mediastinal contours appear intact. Surgical clips in the left axilla. Degenerative changes in the spine. IMPRESSION: No active cardiopulmonary disease. Electronically Signed   By: Lucienne Capers M.D.   On: 04/24/2016 01:33   Ct Head Wo Contrast  Result Date: 04/24/2016 CLINICAL DATA:  Acute onset of slurred speech and bilateral leg weakness. Initial encounter. EXAM: CT HEAD  WITHOUT CONTRAST TECHNIQUE: Contiguous axial images were obtained from the base of the skull through the vertex without intravenous contrast. COMPARISON:  None. FINDINGS: Brain: No evidence of acute infarction, hemorrhage, hydrocephalus, extra-axial collection or mass lesion/mass effect. Prominence of the ventricles and sulci reflects mild cortical volume loss. Scattered periventricular subcortical white matter change likely reflects small vessel ischemic microangiopathy. A small chronic lacunar infarct is noted at the right basal ganglia. The brainstem and fourth ventricle are within normal limits. The cerebral hemispheres demonstrate grossly normal gray-white differentiation. No mass effect or midline shift is seen. Vascular: No hyperdense vessel or unexpected calcification. Skull: There is no evidence of fracture; visualized osseous structures are unremarkable in appearance. Sinuses/Orbits: The orbits are within normal limits. The paranasal sinuses and mastoid air cells are well-aerated. Other: No significant soft tissue abnormalities are seen. IMPRESSION: 1. No acute intracranial pathology seen on CT. 2. Mild cortical volume loss and scattered small vessel ischemic microangiopathy. 3.  Small chronic lacunar infarct at the right basal ganglia. Electronically Signed   By: Garald Balding M.D.   On: 04/24/2016 01:54   Ct Angio Chest Pe W And/or Wo Contrast  Result Date: 04/24/2016 CLINICAL DATA:  Cough, chest pain, and shortness of breath for 2 weeks. History of breast cancer post left mastectomy. Chemotherapy. Multiple recent strokes. Diabetes. EXAM: CT ANGIOGRAPHY CHEST WITH CONTRAST TECHNIQUE: Multidetector CT imaging of the chest was performed using the standard protocol during bolus administration of intravenous contrast. Multiplanar CT image reconstructions and MIPs were obtained to evaluate the vascular anatomy. CONTRAST:  100 mL Isovue 370 COMPARISON:  None. FINDINGS: Cardiovascular: Filling defects  demonstrated in the main and bilateral lobar pulmonary artery is extending into bilateral upper and lower lobe branches consistent with saddle embolus with moderately large clot burden. The RV to LV ratio is elevated at 1.19, suggesting risk of right heart strain. No pericardial effusion. Dilated ascending thoracic aorta at 3.5 cm. No dissection. Scattered calcification. Mediastinum/Nodes: Small esophageal hiatal hernia. Esophagus is decompressed. No significant lymphadenopathy in the chest. Lungs/Pleura: Evaluation is limited due to motion artifact. There patchy nodular ground-glass infiltrative changes demonstrated centrally in the lungs, in the lung bases, and scattered throughout the upper lungs. This may be due to multifocal pneumonia, edema, or infarcts. Metastatic disease would be a less likely consideration. No pleural effusions. No pneumothorax. Upper Abdomen: No acute abnormality. Musculoskeletal: Postoperative changes consistent with left mastectomy and surgical clips in the left axilla. No destructive bone lesions. Review of the MIP images confirms the above findings. IMPRESSION: Positive examination for pulmonary embolus, including saddle embolus and bilateral lobar and segmental pulmonary emboli. Moderately large clot burden. Positive for acute PE with CT evidence of right heart strain (RV/LV Ratio = 1.19) consistent with at least submassive (intermediate risk) PE. The presence of right heart strain has been associated with an increased risk of morbidity and mortality. Please activate Code PE by paging [phone]. Patchy nodular ground-glass infiltrative changes in the lungs may be due to multifocal pneumonia, edema, or infarcts. Metastatic disease would be a less likely consideration but due to history of primary carcinoma, non-contrast chest CT at 3-6 months is recommended. If nodules persist, subsequent management will be based upon the most suspicious nodule(s). This recommendation follows the  consensus statement: Guidelines for Management of Incidental Pulmonary Nodules Detected on CT Images: From the Fleischner Society 2017; Radiology 2017; 284:228-243. These results were called by telephone at the time of interpretation on 04/24/2016 at 4:26 am to Dr. Deno Etienne , who verbally acknowledged these results. Electronically Signed   By: Lucienne Capers M.D.   On: 04/24/2016 04:32    EKG: Independently reviewed.  Assessment/Plan Principal Problem:   Saddle pulmonary embolus (HCC) Active Problems:   DM2 (diabetes mellitus, type 2) (HCC)   Stroke-like symptoms   Pulmonary embolism ([place])   History of cancer of left breast    1. Saddle PE - 1. lovenox per pharm 2. 2d echo 3. BLE [place] for DVT 4. Suspect other lung findings are due to areas of pulmonary infarct, will hold off on further ABx for the moment. 2. Stroke like symptoms - given onset with PE, this is suspicious for embolic stroke due to thrombus embolizing via a PFO 1. Stroke pathway 2. MRI/MRA 3. 2d echo with bubble study to look for PFO 4. Patient being put on theraputic dose lovenox as mentioned above for saddle PE 5. PT/OT/SLP 3. H/o BRCA left breast - 1. Does need repeat  CT chest in 3-6 months 2. Apparently just finished chemo which was done in [place] after lumpectomy 3. Unclear stage 4. DM2 - new diagnosis 1. Sensitive scale SSI Q4H   DVT prophylaxis: Lovenox PE treatment dosing per pharm consult Code Status: Full Family Communication: No family in room Consults called: Neurology Admission status: Admit to inpatient   Etta Quill DO [facility] Pager [phone] from 7PM-7AM  If 7AM-7PM, please contact the day physician for the patient [email or web address] Password [facility]  04/24/2016, 5:56 AM

## 2016-04-24 NOTE — Progress Notes (Signed)
PT Cancellation Note  Patient Details Name: Carrie Watts MRN: [id number] DOB: February 01, 1951   Cancelled Treatment:    Reason Eval/Treat Not Completed: Patient at procedure or test/unavailable;Patient not medically ready. OTF at this time time will follow   Duncan Dull 04/24/2016, 9:27 AM Alben Deeds, PT DPT  [phone]

## 2016-04-24 NOTE — Progress Notes (Signed)
ANTICOAGULATION CONSULT NOTE - Initial Consult  Pharmacy Consult for Lovenox Indication: pulmonary embolus  Allergies  Allergen Reactions  . Dust Mite Extract     Patient Measurements: Height: 5\' 3"  (160 cm) Weight: 194 lb 14.4 oz (88.4 kg) IBW/kg (Calculated) : 52.4  Vital Signs: Temp: 99.5 F (37.5 C) (01/14 0153) Temp Source: Oral (01/14 0002) BP: 139/69 (01/14 0445) Pulse Rate: 80 (01/14 0445)  Labs:  Recent Labs  04/24/16 0007 04/24/16 0027  HGB 10.1* 10.2*  HCT 29.3* 30.0*  PLT 216  --   APTT 40*  --   LABPROT 15.4*  --   INR 1.21  --   CREATININE 1.06* 0.90    Estimated Creatinine Clearance: 64 mL/min (by C-G formula based on SCr of 0.9 mg/dL).   Medical History: Past Medical History:  Diagnosis Date  . Cancer (HCC)    breast  . CHF (congestive heart failure) ([place])   . Diabetes mellitus without complication ([place])   . Renal disorder     Assessment: 66yo female on chemotherapy in [place] c/o focal weakness and speech change as well as SOB and CP 2/2 cough, CT of head is negative, CT chest shows saddle PE w/ RHS, to begin LMWH.  Goal of Therapy:  Anti-Xa level 0.6-1 units/ml 4hrs after LMWH dose given Monitor platelets by anticoagulation protocol: Yes   Plan:  Will begin Lovenox 90mg  SQ Q12H and monitor CBC; f/u long-term anticoag plan.  Wynona Neat, PharmD, BCPS  04/24/2016,6:21 AM

## 2016-04-24 NOTE — Progress Notes (Signed)
[facility] PROGRESS NOTE  Carrie Watts [id number] DOB: 10-25-1950 DOA: 04/24/2016  PCP: No primary care provider on file.  Brief History/Interval Summary: 66 year old female who was in [place] and flew back to the [place] yesterday, 1/12 or 13. She presented with complaints of right-sided facial droop, difficulty speaking, chest discomfort, cough and shortness of breath. The symptoms apparently have been ongoing for a few days. Evaluation revealed significantly large primary embolism. There was also concern for stroke. Patient does have a history of for breast cancer and is status post lumpectomy and apparently undergoing chemotherapy, but this is not entirely clear.  Reason for Visit: Acute primary embolism.  Consultants: Neurology. Pulmonology.  Procedures: None yet  Antibiotics: Given antibiotics in the ED. Being held  Subjective/Interval History: Patient has some difficulty speaking. This has also been ongoing for a few days. Hence, history is somewhat limited. She denies any chest pain per se. She has been having cough. Also has noted some right-sided weakness.  ROS: Denies any nausea or vomiting  Objective:  Vital Signs  Vitals:   04/24/16 0800 04/24/16 0829 04/24/16 0900 04/24/16 1000  BP: (!) 138/104  (!) 111/48 120/65  Pulse: (!) 104  80 78  Resp: (!) 29  17 18   Temp:  98 F (36.7 C)    TempSrc:  Oral    SpO2: 93%  93% 93%  Weight:      Height:        Intake/Output Summary (Last 24 hours) at 04/24/16 1026 Last data filed at 04/24/16 1000  Gross per 24 hour  Intake           751.25 ml  Output              400 ml  Net           351.25 ml   Filed Weights   04/24/16 0603  Weight: 88.4 kg (194 lb 14.4 oz)    General appearance: alert, cooperative, appears stated age and no distress Resp: Coarse breath sounds bilaterally. Somewhat diminished at the bases. No wheezing or rhonchi noted. Cardio: regular rate and rhythm, S1, S2 normal, no  murmur, click, rub or gallop GI: soft, non-tender; bowel sounds normal; no masses,  no organomegaly Extremities: extremities normal, atraumatic, no cyanosis or edema Pulses: 2+ and symmetric Skin: Skin color, texture, turgor normal. No rashes or lesions Neurologic: Patient is awake, alert. Some stuttering of speech is noted. Tongue appears to be midline. No facial asymmetry. Shoulder shrug is equal. No clear pronator drift. However, she is noted to be weak on the right side. Strength is probably about 3-4 out of 5. Weakness also appreciated in the right lower extremity.  Lab Results:  Data Reviewed: I have personally reviewed following labs and imaging studies  CBC:  Recent Labs Lab 04/24/16 0007 04/24/16 0027  WBC 8.7  --   NEUTROABS 6.3  --   HGB 10.1* 10.2*  HCT 29.3* 30.0*  MCV 89.3  --   PLT 216  --     Basic Metabolic Panel:  Recent Labs Lab 04/24/16 0007 04/24/16 0027  NA 130* 130*  K 4.0 3.9  CL 100* 101  CO2 19*  --   GLUCOSE 324* 334*  BUN 14 14  CREATININE 1.06* 0.90  CALCIUM 9.5  --     GFR: Estimated Creatinine Clearance: 64 mL/min (by C-G formula based on SCr of 0.9 mg/dL).  Liver Function Tests:  Recent Labs Lab 04/24/16 0007  AST 30  ALT 22  ALKPHOS 83  BILITOT 0.6  PROT 6.8  ALBUMIN 2.7*    Coagulation Profile:  Recent Labs Lab 04/24/16 0007  INR 1.21    CBG:  Recent Labs Lab 04/24/16 0323 04/24/16 0643 04/24/16 0831  GLUCAP 300* 210* 190*     Recent Results (from the past 240 hour(s))  MRSA PCR Screening     Status: None   Collection Time: 04/24/16  6:31 AM  Result Value Ref Range Status   MRSA by PCR NEGATIVE NEGATIVE Final    Comment:        The GeneXpert MRSA Assay (FDA approved for NASAL specimens only), is one component of a comprehensive MRSA colonization surveillance program. It is not intended to diagnose MRSA infection nor to guide or monitor treatment for MRSA infections.       Radiology  Studies: Dg Chest 2 View  Result Date: 04/24/2016 CLINICAL DATA:  Cough. History of hypertension and CHF. History of breast cancer. Patient has had loss of function on the right side over body and has difficulty speaking. EXAM: CHEST  2 VIEW COMPARISON:  None. FINDINGS: Normal heart size and pulmonary vascularity. No focal airspace disease or consolidation in the lungs. No blunting of costophrenic angles. No pneumothorax. Mediastinal contours appear intact. Surgical clips in the left axilla. Degenerative changes in the spine. IMPRESSION: No active cardiopulmonary disease. Electronically Signed   By: Lucienne Capers M.D.   On: 04/24/2016 01:33   Ct Head Wo Contrast  Result Date: 04/24/2016 CLINICAL DATA:  Acute onset of slurred speech and bilateral leg weakness. Initial encounter. EXAM: CT HEAD WITHOUT CONTRAST TECHNIQUE: Contiguous axial images were obtained from the base of the skull through the vertex without intravenous contrast. COMPARISON:  None. FINDINGS: Brain: No evidence of acute infarction, hemorrhage, hydrocephalus, extra-axial collection or mass lesion/mass effect. Prominence of the ventricles and sulci reflects mild cortical volume loss. Scattered periventricular subcortical white matter change likely reflects small vessel ischemic microangiopathy. A small chronic lacunar infarct is noted at the right basal ganglia. The brainstem and fourth ventricle are within normal limits. The cerebral hemispheres demonstrate grossly normal gray-white differentiation. No mass effect or midline shift is seen. Vascular: No hyperdense vessel or unexpected calcification. Skull: There is no evidence of fracture; visualized osseous structures are unremarkable in appearance. Sinuses/Orbits: The orbits are within normal limits. The paranasal sinuses and mastoid air cells are well-aerated. Other: No significant soft tissue abnormalities are seen. IMPRESSION: 1. No acute intracranial pathology seen on CT. 2. Mild  cortical volume loss and scattered small vessel ischemic microangiopathy. 3. Small chronic lacunar infarct at the right basal ganglia. Electronically Signed   By: Garald Balding M.D.   On: 04/24/2016 01:54   Ct Angio Chest Pe W And/or Wo Contrast  Result Date: 04/24/2016 CLINICAL DATA:  Cough, chest pain, and shortness of breath for 2 weeks. History of breast cancer post left mastectomy. Chemotherapy. Multiple recent strokes. Diabetes. EXAM: CT ANGIOGRAPHY CHEST WITH CONTRAST TECHNIQUE: Multidetector CT imaging of the chest was performed using the standard protocol during bolus administration of intravenous contrast. Multiplanar CT image reconstructions and MIPs were obtained to evaluate the vascular anatomy. CONTRAST:  100 mL Isovue 370 COMPARISON:  None. FINDINGS: Cardiovascular: Filling defects demonstrated in the main and bilateral lobar pulmonary artery is extending into bilateral upper and lower lobe branches consistent with saddle embolus with moderately large clot burden. The RV to LV ratio is elevated at 1.19, suggesting risk of right heart strain. No pericardial effusion. Dilated  ascending thoracic aorta at 3.5 cm. No dissection. Scattered calcification. Mediastinum/Nodes: Small esophageal hiatal hernia. Esophagus is decompressed. No significant lymphadenopathy in the chest. Lungs/Pleura: Evaluation is limited due to motion artifact. There patchy nodular ground-glass infiltrative changes demonstrated centrally in the lungs, in the lung bases, and scattered throughout the upper lungs. This may be due to multifocal pneumonia, edema, or infarcts. Metastatic disease would be a less likely consideration. No pleural effusions. No pneumothorax. Upper Abdomen: No acute abnormality. Musculoskeletal: Postoperative changes consistent with left mastectomy and surgical clips in the left axilla. No destructive bone lesions. Review of the MIP images confirms the above findings. IMPRESSION: Positive examination for  pulmonary embolus, including saddle embolus and bilateral lobar and segmental pulmonary emboli. Moderately large clot burden. Positive for acute PE with CT evidence of right heart strain (RV/LV Ratio = 1.19) consistent with at least submassive (intermediate risk) PE. The presence of right heart strain has been associated with an increased risk of morbidity and mortality. Please activate Code PE by paging [phone]. Patchy nodular ground-glass infiltrative changes in the lungs may be due to multifocal pneumonia, edema, or infarcts. Metastatic disease would be a less likely consideration but due to history of primary carcinoma, non-contrast chest CT at 3-6 months is recommended. If nodules persist, subsequent management will be based upon the most suspicious nodule(s). This recommendation follows the consensus statement: Guidelines for Management of Incidental Pulmonary Nodules Detected on CT Images: From the Fleischner Society 2017; Radiology 2017; 284:228-243. These results were called by telephone at the time of interpretation on 04/24/2016 at 4:26 am to Dr. Deno Etienne , who verbally acknowledged these results. Electronically Signed   By: Lucienne Capers M.D.   On: 04/24/2016 04:32     Medications:  Scheduled: .  stroke: mapping our early stages of recovery book   Does not apply Once  . enoxaparin (LOVENOX) injection  90 mg Subcutaneous Q12H  . insulin aspart  0-9 Units Subcutaneous Q4H  . white petrolatum       Continuous: . sodium chloride 75 mL/hr at 04/24/16 [id number]   [id number] **OR** acetaminophen (TYLENOL) oral liquid 160 mg/5 mL **OR** acetaminophen  Assessment/Plan:  Principal Problem:   Saddle pulmonary embolus (HCC) Active Problems:   DM2 (diabetes mellitus, type 2) (HCC)   Stroke-like symptoms   Pulmonary embolism (HCC)   History of cancer of left breast   Pressure injury of skin    Acute saddle pulmonary embolism. Risk factors include malignancy as well as recent  travel. Patient has been placed on Lovenox. She will continue to remain on the same because of her active malignancy. Pulmonology has been consulted. Patient appears to be hemodynamically stable at this time. Echocardiogram has been ordered and is pending.  Patchy nodular ground-glass infiltrative changes in the lungs noted on the CT scan. Likely represent infarcts. Unlikely to be infectious process. Could also be related to her malignancy.  Strokelike symptoms with the minimal speech impairment as well as right-sided weakness. MRI brain is pending. She could've had paradoxical stroke. Lower extremity Doppler studies are pending. Echocardiogram is pending to look for PFO. Neurology consulted. PT, OT and SLP evaluation.  History of for left-sided breast cancer, status post lumpectomy. Patient is not very clear regarding her history of cancer. According to her she underwent left lumpectomy in either March or April 2017. This was done in [place]. She was on chemotherapy and it remains unclear if she has completed the course or is in the middle of her chemotherapeutic regimen.  All care is being provided at [place]. She is here visiting her daughter.  Hyperglycemia No known history of diabetes. Medication list does not show any diabetic medications. Not noted to be on steroids either, although per patient, she has been on chemotherapy and so could've received steroids recently. This could also be new onset diabetes. HbA1c will be checked. Continue SSI.  DVT Prophylaxis: Non-full dose Lovenox    Code Status: Full code  Family Communication: Discussed with the patient  Disposition Plan: Management as outlined above. Await MRI brain, echocardiogram.    LOS: 0 days   [facility] Pager [phone] 04/24/2016, 10:26 AM  If 7PM-7AM, please contact night-coverage at [email or web address], password [facility]

## 2016-04-24 NOTE — Evaluation (Signed)
Clinical/Bedside Swallow Evaluation Patient Details  Name: Carrie Watts MRN: [id number] Date of Birth: 1950/08/14  Today's Date: 04/24/2016 Time: SLP Start Time (ACUTE ONLY): 22 SLP Stop Time (ACUTE ONLY): 0932 SLP Time Calculation (min) (ACUTE ONLY): 17 min  Past Medical History:  Past Medical History:  Diagnosis Date  . Cancer (HCC)    breast  . CHF (congestive heart failure) ([place])   . Diabetes mellitus without complication ([place])   . Renal disorder    Past Surgical History:  Past Surgical History:  Procedure Laterality Date  . BREAST SURGERY     HPI:  66 y/o female came to the [facility] ED on 1/14 with cough, dyspnea and chest pain in the setting of two weeks of R sided facial droop and slurred speech after returning from [place] on 1/13.  Noted to have a large PE on CT angiogram so PCCM was consulted for further evaluation.  She is being evaluated by neurology for a possible stroke.  She has received lovenox for the PE.  Neurology has ordered an MRI of the brain   Assessment / Plan / Recommendation Clinical Impression  Pt presents with mild oropharyngeal dysphagia. Pt with mildly reduced right sided facial ROM. Note prolonged mastication of solid PO with delayed AP transit. Pt reports reduced appetite with onset following chemotherapy and frequent fatigue. Pt with multiple swallows observed across consistencies however no overt s/sx of aspiration evidenced. Recommend dysphagia 3 (mechanical soft ) and thin liquid diet with medicines whole in puree. ST to follow up for diet tolerance and SLE following completion of neurologic imaging, as nursing reports MRI of brain scheduled this date.      Aspiration Risk  Mild aspiration risk    Diet Recommendation   Dysphagia 3 (mechanical soft) Thin liquids   Medication Administration: Whole meds with puree    Other  Recommendations Oral Care Recommendations: Oral care BID   Follow up Recommendations        Frequency and Duration min 1  x/week  1 week       Prognosis Prognosis for Safe Diet Advancement: Good      Swallow Study   General Date of Onset: 04/24/16 HPI: 66 y/o female came to the [facility] ED on 1/14 with cough, dyspnea and chest pain in the setting of two weeks of R sided facial droop and slurred speech after returning from [place] on 1/13.  Noted to have a large PE on CT angiogram so PCCM was consulted for further evaluation.  She is being evaluated by neurology for a possible stroke.  She has received lovenox for the PE.  Neurology has ordered an MRI of the brain Type of Study: Bedside Swallow Evaluation Previous Swallow Assessment: none prior  Diet Prior to this Study: NPO Temperature Spikes Noted: No Respiratory Status: Room air History of Recent Intubation: No Behavior/Cognition: Alert;Cooperative;Pleasant mood Oral Cavity Assessment: Within Functional Limits Oral Cavity - Dentition: Adequate natural dentition Vision: Functional for self-feeding Self-Feeding Abilities: Needs assist Patient Positioning: Upright in bed Baseline Vocal Quality: Low vocal intensity Volitional Cough: Strong Volitional Swallow: Able to elicit    Oral/Motor/Sensory Function Overall Oral Motor/Sensory Function: Mild impairment Facial ROM: Reduced right   Ice Chips Ice chips: Within functional limits   Thin Liquid Thin Liquid: Impaired Pharyngeal  Phase Impairments: Multiple swallows;Suspected delayed Swallow    Nectar Thick Nectar Thick Liquid: Not tested   Honey Thick Honey Thick Liquid: Not tested   Puree Puree: Within functional limits   Solid  GO   Solid: Impaired Presentation: Self Fed Oral Phase Impairments: Impaired mastication Oral Phase Functional Implications: Prolonged oral transit;Oral residue Pharyngeal Phase Impairments: Suspected delayed Swallow;Multiple swallows;Throat Clearing - Delayed       Arvil Chaco MA, CCC-SLP Acute Care Speech Language Pathologist    Levi Aland 04/24/2016,9:45 AM

## 2016-04-24 NOTE — Consult Note (Signed)
NEURO HOSPITALIST CONSULT NOTE   Requestig physician: Dr. Tyrone Nine  Reason for Consult: New onset of right sided weakness  History obtained from:  Patient and Chart     HPI:                                                                                                                                          Carrie Watts is an 66 y.o. female who presents with a one week history of right sided weakness. Her symptoms began last Monday while she was in [place] receiving chemotherapy treatments for her breast cancer. She states that her daughter noticed difficulty speaking when she picked the patient up from the airport on Saturday. The patient denies vision changes, headache, neck pain, chest pain, abdominal pain or limb pain; however, on exam she states that movement of her lower extremities is painful. She gives short answers to questions lacking in detail and there is difficulty in obtaining a detailed description of neurological symptoms and history from her. She stated to me that she was in [place] but informed the ED attending that she was recently in [place].   CT head was negative. CTA of chest demonstrated a large clot burden PE and saddle PE, as well as ground glass areas that radiology feels may be PNA versus infarct versus malignancy spread, per report.   Her PMHx includes breast CA, CHF and DM.   Past Medical History:  Diagnosis Date  . Cancer (HCC)    breast  . CHF (congestive heart failure) ([place])   . Diabetes mellitus without complication ([place])   . Renal disorder     Past Surgical History:  Procedure Laterality Date  . BREAST SURGERY     No family history on file.  Social History:  reports that she has never smoked. She has never used smokeless tobacco. She reports that she does not drink alcohol. Her drug history is not on file.  Allergies  Allergen Reactions  . Dust Mite Extract     MEDICATIONS:                                                                                                                      aspirin EC 81 MG tablet Take 81 mg by mouth daily. Historical Provider,  MD Needs Review  omeprazole (PRILOSEC) 20 MG capsule Take 20 mg by mouth daily. Historical Provider, MD Needs Review  PRESCRIPTION MEDICATION Take 1 tablet by mouth daily. Blood Pressure Historical Provider, MD Needs Review     ROS:                                                                                                                                       History obtained from patient. ROS as documented in HPI.   Blood pressure 141/72, pulse 96, temperature 99.5 F (37.5 C), resp. rate 18, SpO2 97 %.  General Examination:                                                                                                      HEENT-  Normocephalic/atraumatic. Nondiaphoretic.  Extremities- Noncyanotic. TTP posterior knees and lower legs bilaterally.   Neurological Examination Mental Status: Alert, oriented, thought content appropriate.  Speech fluent without evidence of aphasia.  Able to follow all commands without difficulty. Cranial Nerves: II: Visual fields grossly normal, PERRL III,IV, VI: ptosis not present, EOMI without nystagmus V,VII: smile and lower facial movements are symmetric when conversing with examiner; when attention is drawn to face and patient is asked to smile, left side draws up and right side of mouth remains frozen in place with quivering motion appearing superficially similar to facial droop but most consistent with embellishment. Facial temperature sensation is subjectively normal bilaterally VIII: hearing intact to questions and commands IX,X: no hypophonia or hoarseness XI: symmetric XII: midline tongue extension Motor:  RUE 4+/5 LUE 5/5 Bilateral lower extremities symmetric with poor effort on exam. Elevates both extremities below knees at 4/5 without asymmetry; does not flex legs at hips against gravity; states lower  extremity pain limits her effort.  Normal tone throughout; no atrophy noted Sensory: Temperature and light touch intact throughout, bilaterally Deep Tendon Reflexes: Hypoactive lower extremities bilaterally. 1+ upper extremities bilaterally.  Plantars: Right: downgoing   Left: downgoing Cerebellar: No ataxia with FNF bilaterally.  Gait: Deferred.   Lab Results: Basic Metabolic Panel:  Recent Labs Lab 04/24/16 0007 04/24/16 0027  NA 130* 130*  K 4.0 3.9  CL 100* 101  CO2 19*  --   GLUCOSE 324* 334*  BUN 14 14  CREATININE 1.06* 0.90  CALCIUM 9.5  --     Liver Function Tests:  Recent Labs Lab 04/24/16 0007  AST 30  ALT 22  ALKPHOS 83  BILITOT 0.6  PROT 6.8  ALBUMIN 2.7*   No results for input(s): LIPASE, AMYLASE in the last 168 hours. No results for input(s): AMMONIA in the last 168 hours.  CBC:  Recent Labs Lab 04/24/16 0007 04/24/16 0027  WBC 8.7  --   NEUTROABS 6.3  --   HGB 10.1* 10.2*  HCT 29.3* 30.0*  MCV 89.3  --   PLT 216  --     Cardiac Enzymes: No results for input(s): CKTOTAL, CKMB, CKMBINDEX, TROPONINI in the last 168 hours.  Lipid Panel: No results for input(s): CHOL, TRIG, HDL, CHOLHDL, VLDL, LDLCALC in the last 168 hours.  CBG: No results for input(s): GLUCAP in the last 168 hours.  Microbiology: No results found for this or any previous visit.  Coagulation Studies:  Recent Labs  04/24/16 0007  LABPROT 15.4*  INR 1.21    Imaging: Dg Chest 2 View  Result Date: 04/24/2016 CLINICAL DATA:  Cough. History of hypertension and CHF. History of breast cancer. Patient has had loss of function on the right side over body and has difficulty speaking. EXAM: CHEST  2 VIEW COMPARISON:  None. FINDINGS: Normal heart size and pulmonary vascularity. No focal airspace disease or consolidation in the lungs. No blunting of costophrenic angles. No pneumothorax. Mediastinal contours appear intact. Surgical clips in the left axilla. Degenerative  changes in the spine. IMPRESSION: No active cardiopulmonary disease. Electronically Signed   By: Lucienne Capers M.D.   On: 04/24/2016 01:33   Ct Head Wo Contrast  Result Date: 04/24/2016 CLINICAL DATA:  Acute onset of slurred speech and bilateral leg weakness. Initial encounter. EXAM: CT HEAD WITHOUT CONTRAST TECHNIQUE: Contiguous axial images were obtained from the base of the skull through the vertex without intravenous contrast. COMPARISON:  None. FINDINGS: Brain: No evidence of acute infarction, hemorrhage, hydrocephalus, extra-axial collection or mass lesion/mass effect. Prominence of the ventricles and sulci reflects mild cortical volume loss. Scattered periventricular subcortical white matter change likely reflects small vessel ischemic microangiopathy. A small chronic lacunar infarct is noted at the right basal ganglia. The brainstem and fourth ventricle are within normal limits. The cerebral hemispheres demonstrate grossly normal gray-white differentiation. No mass effect or midline shift is seen. Vascular: No hyperdense vessel or unexpected calcification. Skull: There is no evidence of fracture; visualized osseous structures are unremarkable in appearance. Sinuses/Orbits: The orbits are within normal limits. The paranasal sinuses and mastoid air cells are well-aerated. Other: No significant soft tissue abnormalities are seen. IMPRESSION: 1. No acute intracranial pathology seen on CT. 2. Mild cortical volume loss and scattered small vessel ischemic microangiopathy. 3. Small chronic lacunar infarct at the right basal ganglia. Electronically Signed   By: Garald Balding M.D.   On: 04/24/2016 01:54    Assessment: 1. One week history of right sided weakness. DDx includes metastatic disease, stroke and stress-related symptoms. Stroke is relatively high on the DDx given PE - paradoxical embolization via possible PFO is a consideration.  2. CT shows no acute intracranial abnormality. There is mild  cortical volume loss and scattered small vessel ischemic microangiopathy. A small chronic lacunar infarct is seen within the right basal ganglia. 3. Mild hyponatremia.  4. Hyperglycemia.  5. Breast cancer.  6. Saddle PE.   Recommendations: 1. MRI brain with and without contrast. If positive for stroke, obtain remainder of stroke work up.  2. TTE with bubble study.   3. MRA of head.  4. Carotid ultrasound.  5. Management of hyponatremia and hyperglycemia as per primary team.  6.  Management of saddle  PE as per primary team. 7. PT/OT/Speech. 8. If starting anticoagulation, consider holding ASA.   Electronically signed: Dr. Kerney Elbe 04/24/2016, 3:04 AM

## 2016-04-24 NOTE — Progress Notes (Signed)
Echocardiogram 2D Echocardiogram has been performed.  Carrie Watts 04/24/2016, 3:21 PM

## 2016-04-24 NOTE — ED Provider Notes (Signed)
[facility] DEPT Provider Note   CSN: [id number] Arrival date & time: 04/23/16  2348  By signing my name below, I, Dora Sims, attest that this documentation has been prepared under the direction and in the presence of physician practitioner, Deno Etienne, DO. Electronically Signed: Dora Sims, Scribe. 04/24/2016. 1:12 AM.  History   Chief Complaint Chief Complaint  Patient presents with  . difficulty speaking    The history is provided by the patient. No language interpreter was used.  Weakness  Primary symptoms include focal weakness, speech change. This is a new problem. The current episode started more than 1 week ago. The problem has been gradually worsening. There was right lower extremity and right upper extremity focality noted. There has been no fever. Associated symptoms include shortness of breath (secondary to cough) and chest pain (secondary to cough). There were no medications administered prior to arrival.     HPI Comments: Carrie Watts is a 66 y.o. female brought in by family, with PMHx significant for breast cancer, CHF, and DM, who presents to the Emergency Department complaining of intermittent, gradually worsening, right-sided focal weakness for the last few weeks. Pt reports weakness of her right extremities and notes a right-sided facial droop as well. She notes associated slow, slurred speech. She reports her weakness and difficulty speaking have presented since undergoing chemotherapy treatments and a mastectomy for breast cancer. Her last chemotherapy treatment was on 04/06/16. Pt additionally reports a persistent cough and chest pain/SOB secondary to her cough for about 2 weeks. Daughter reports patient's speech is gradually improving but is still slurred and slow. She notes she has been having recent kidney problems secondary to elevated creatinine. No h/o heart attack. No h/o blood clot. Per daughter, pt denies fever, chills, numbness/tingling, or any other  associated symptoms. Patient is undergoing chemotherapy in [place].  Past Medical History:  Diagnosis Date  . Cancer (HCC)    breast  . CHF (congestive heart failure) ([place])   . Diabetes mellitus without complication ([place])   . Renal disorder     There are no active problems to display for this patient.   Past Surgical History:  Procedure Laterality Date  . BREAST SURGERY      OB History    No data available       Home Medications    Prior to Admission medications   Not on File    Family History No family history on file.  Social History Social History  Substance Use Topics  . Smoking status: Never Smoker  . Smokeless tobacco: Never Used  . Alcohol use No     Allergies   Dust mite extract   Review of Systems Review of Systems  Constitutional: Negative for chills and fever.  Respiratory: Positive for cough and shortness of breath (secondary to cough).   Cardiovascular: Positive for chest pain (secondary to cough).  Neurological: Positive for speech change, focal weakness, facial asymmetry and speech difficulty. Negative for numbness.  All other systems reviewed and are negative.    Physical Exam Updated Vital Signs BP 117/83 (BP Location: Right Arm)   Pulse 112   Temp 97.8 F (36.6 C) (Oral)   Resp 18   SpO2 96%   Physical Exam  Constitutional: She is oriented to person, place, and time. She appears well-developed and well-nourished. No distress.  HENT:  Head: Normocephalic and atraumatic.  Right-sided facial droop. Slurred, confused speech.  Eyes: EOM are normal. Pupils are equal, round, and reactive to light.  Neck:  Normal range of motion. Neck supple.  Cardiovascular: Normal rate and regular rhythm.  Exam reveals no gallop and no friction rub.   No murmur heard. Pulmonary/Chest: Effort normal. She has no wheezes. She has no rales.  Abdominal: Soft. She exhibits no distension. There is no tenderness.  Musculoskeletal: She exhibits no edema  or tenderness.  Neurological: She is alert and oriented to person, place, and time.  Skin: Skin is warm and dry. She is not diaphoretic.  Psychiatric: She has a normal mood and affect. Her behavior is normal.  Nursing note and vitals reviewed.    ED Treatments / Results  Labs (all labs ordered are listed, but only abnormal results are displayed) Labs Reviewed  PROTIME-INR - Abnormal; Notable for the following:       Result Value   Prothrombin Time 15.4 (*)    All other components within normal limits  APTT - Abnormal; Notable for the following:    aPTT 40 (*)    All other components within normal limits  COMPREHENSIVE METABOLIC PANEL - Abnormal; Notable for the following:    Sodium 130 (*)    Chloride 100 (*)    CO2 19 (*)    Glucose, Bld 324 (*)    Creatinine, Ser 1.06 (*)    Albumin 2.7 (*)    GFR calc non Af Amer 53 (*)    All other components within normal limits  I-STAT TROPOININ, ED - Abnormal; Notable for the following:    Troponin i, poc 0.25 (*)    All other components within normal limits  I-STAT CHEM 8, ED - Abnormal; Notable for the following:    Sodium 130 (*)    Glucose, Bld 334 (*)    Hemoglobin 10.2 (*)    HCT 30.0 (*)    All other components within normal limits  CBC  DIFFERENTIAL  CBG MONITORING, ED    EKG  EKG Interpretation  Date/Time:  Sunday April 24 2016 00:15:24 EST Ventricular Rate:  99 PR Interval:  156 QRS Duration: 88 QT Interval:  424 QTC Calculation: 544 R Axis:   -66 Text Interpretation:  Normal sinus rhythm Left axis deviation Low voltage QRS Possible Anterolateral infarct , age undetermined Prolonged QT Abnormal ECG No old tracing to compare Confirmed by Kastiel Simonian MD, DANIEL ([id number]) on 04/24/2016 12:51:00 AM       Radiology No results found.  Procedures Procedures (including critical care time)  DIAGNOSTIC STUDIES: Oxygen Saturation is 96% on RA, adequate by my interpretation.    COORDINATION OF CARE: 1:27 AM Discussed  treatment plan with pt and her family members at bedside and they agreed to plan.  Medications Ordered in ED Medications - No data to display   Initial Impression / Assessment and Plan / ED Course  I have reviewed the triage vital signs and the nursing notes.  Pertinent labs & imaging results that were available during my care of the patient were reviewed by me and considered in my medical decision making (see chart for details).  Clinical Course     65  yo F With a chief complaints of right-sided facial droop and slurred speech. Going on for the past 3 weeks. Patient is also had cough fever shortness breath and chest pain for the past week. Patient has a positive troponin here in the ED. With her history of cancer not on anticoagulation will obtain a PET scan. Patient found to have a saddle embolus. Discussed the case with Dr. Ashok Cordia, Crit care, no TPA  recommended at this time. Discussed the patient's strokelike symptoms with neurology, Dr. Earnestine Leys, recommended MR of the brain and they will evaluate the patient. Will admit to stepdown.   CRITICAL CARE Performed by: Cecilio Asper   Total critical care time: 80 minutes  Critical care time was exclusive of separately billable procedures and treating other patients.  Critical care was necessary to treat or prevent imminent or life-threatening deterioration.  Critical care was time spent personally by me on the following activities: development of treatment plan with patient and/or surrogate as well as nursing, discussions with consultants, evaluation of patient's response to treatment, examination of patient, obtaining history from patient or surrogate, ordering and performing treatments and interventions, ordering and review of laboratory studies, ordering and review of radiographic studies, pulse oximetry and re-evaluation of patient's condition.  The patients results and plan were reviewed and discussed.   Any x-rays performed were  independently reviewed by myself.   Differential diagnosis were considered with the presenting HPI.  Medications  vancomycin (VANCOCIN) 1,500 mg in sodium chloride 0.9 % 500 mL IVPB (1,500 mg Intravenous New Bag/Given 04/24/16 0459)  piperacillin-tazobactam (ZOSYN) IVPB 3.375 g (3.375 g Intravenous New Bag/Given 04/24/16 0456)  sodium chloride 0.9 % bolus 500 mL (0 mLs Intravenous Stopped 04/24/16 0359)  aspirin chewable tablet 324 mg (324 mg Oral Given 04/24/16 0150)  iopamidol (ISOVUE-370) 76 % injection (75 mLs  Contrast Given 04/24/16 0200)    Vitals:   04/24/16 0200 04/24/16 0245 04/24/16 0300 04/24/16 0315  BP: 101/67 141/72 157/81 146/80  Pulse: 97 96 97 88  Resp: 19 18 19 17   Temp:      TempSrc:      SpO2: 93% 97% 96% 96%    Final diagnoses:  Acute saddle pulmonary embolism with acute cor pulmonale (HCC)  HCAP (healthcare-associated pneumonia)    Admission/ observation were discussed with the admitting physician, patient and/or family and they are comfortable with the plan.    Final Clinical Impressions(s) / ED Diagnoses   Final diagnoses:  None    New Prescriptions New Prescriptions   No medications on file   I personally performed the services described in this documentation, which was scribed in my presence. The recorded information has been reviewed and is accurate.     Deno Etienne, DO 04/24/16 0500

## 2016-04-25 ENCOUNTER — Inpatient Hospital Stay (HOSPITAL_COMMUNITY): Payer: Medicare Other

## 2016-04-25 DIAGNOSIS — I2692 Saddle embolus of pulmonary artery without acute cor pulmonale: Secondary | ICD-10-CM | POA: Diagnosis not present

## 2016-04-25 DIAGNOSIS — I82401 Acute embolism and thrombosis of unspecified deep veins of right lower extremity: Secondary | ICD-10-CM

## 2016-04-25 DIAGNOSIS — I2602 Saddle embolus of pulmonary artery with acute cor pulmonale: Secondary | ICD-10-CM | POA: Diagnosis not present

## 2016-04-25 DIAGNOSIS — I639 Cerebral infarction, unspecified: Secondary | ICD-10-CM | POA: Diagnosis not present

## 2016-04-25 DIAGNOSIS — E785 Hyperlipidemia, unspecified: Secondary | ICD-10-CM

## 2016-04-25 DIAGNOSIS — E1122 Type 2 diabetes mellitus with diabetic chronic kidney disease: Secondary | ICD-10-CM | POA: Diagnosis not present

## 2016-04-25 DIAGNOSIS — I509 Heart failure, unspecified: Secondary | ICD-10-CM | POA: Diagnosis not present

## 2016-04-25 DIAGNOSIS — I130 Hypertensive heart and chronic kidney disease with heart failure and stage 1 through stage 4 chronic kidney disease, or unspecified chronic kidney disease: Secondary | ICD-10-CM | POA: Diagnosis not present

## 2016-04-25 DIAGNOSIS — E871 Hypo-osmolality and hyponatremia: Secondary | ICD-10-CM | POA: Diagnosis not present

## 2016-04-25 DIAGNOSIS — Z853 Personal history of malignant neoplasm of breast: Secondary | ICD-10-CM | POA: Diagnosis not present

## 2016-04-25 DIAGNOSIS — R2990 Unspecified symptoms and signs involving the nervous system: Secondary | ICD-10-CM | POA: Diagnosis not present

## 2016-04-25 LAB — CBC
HEMATOCRIT: 25.1 % — AB (ref 36.0–46.0)
Hemoglobin: 8.4 g/dL — ABNORMAL LOW (ref 12.0–15.0)
MCH: 30.9 pg (ref 26.0–34.0)
MCHC: 33.5 g/dL (ref 30.0–36.0)
MCV: 92.3 fL (ref 78.0–100.0)
Platelets: 210 10*3/uL (ref 150–400)
RBC: 2.72 MIL/uL — ABNORMAL LOW (ref 3.87–5.11)
RDW: 16.6 % — ABNORMAL HIGH (ref 11.5–15.5)
WBC: 11.2 10*3/uL — AB (ref 4.0–10.5)

## 2016-04-25 LAB — GLUCOSE, CAPILLARY
GLUCOSE-CAPILLARY: 114 mg/dL — AB (ref 65–99)
GLUCOSE-CAPILLARY: 240 mg/dL — AB (ref 65–99)
GLUCOSE-CAPILLARY: 303 mg/dL — AB (ref 65–99)
Glucose-Capillary: 149 mg/dL — ABNORMAL HIGH (ref 65–99)
Glucose-Capillary: 211 mg/dL — ABNORMAL HIGH (ref 65–99)
Glucose-Capillary: 223 mg/dL — ABNORMAL HIGH (ref 65–99)

## 2016-04-25 LAB — LIPID PANEL
Cholesterol: 196 mg/dL (ref 0–200)
HDL: 38 mg/dL — AB (ref >40)
LDL Cholesterol: 127 mg/dL — ABNORMAL HIGH (ref 0–99)
Total CHOL/HDL Ratio: 5.2 RATIO
Triglycerides: 154 mg/dL — ABNORMAL HIGH (ref <150)
VLDL: 31 mg/dL (ref 0–40)

## 2016-04-25 LAB — BASIC METABOLIC PANEL
ANION GAP: 9 (ref 5–15)
BUN: 5 mg/dL — ABNORMAL LOW (ref 6–20)
CHLORIDE: 111 mmol/L (ref 101–111)
CO2: 19 mmol/L — AB (ref 22–32)
Calcium: 8.8 mg/dL — ABNORMAL LOW (ref 8.9–10.3)
Creatinine, Ser: 0.85 mg/dL (ref 0.44–1.00)
GFR calc Af Amer: >60 mL/min (ref >60)
GFR calc non Af Amer: >60 mL/min (ref >60)
GLUCOSE: 117 mg/dL — AB (ref 65–99)
POTASSIUM: 3.8 mmol/L (ref 3.5–5.1)
Sodium: 139 mmol/L (ref 135–145)

## 2016-04-25 MED ORDER — ATORVASTATIN CALCIUM 20 MG PO TABS
20.0000 mg | ORAL_TABLET | Freq: Every day | ORAL | Status: DC
  Administered 2016-04-25 – 2016-05-02 (×8): 20 mg via ORAL
  Filled 2016-04-25 (×8): qty 1

## 2016-04-25 MED ORDER — POLYETHYLENE GLYCOL 3350 17 G PO PACK
17.0000 g | PACK | Freq: Every day | ORAL | Status: DC
  Administered 2016-04-25 – 2016-04-30 (×5): 17 g via ORAL
  Filled 2016-04-25 (×7): qty 1

## 2016-04-25 MED ORDER — FUROSEMIDE 10 MG/ML IJ SOLN
40.0000 mg | Freq: Once | INTRAMUSCULAR | Status: AC
  Administered 2016-04-25: 40 mg via INTRAVENOUS
  Filled 2016-04-25: qty 4

## 2016-04-25 MED ORDER — DOCUSATE SODIUM 100 MG PO CAPS
100.0000 mg | ORAL_CAPSULE | Freq: Two times a day (BID) | ORAL | Status: DC | PRN
  Administered 2016-04-25 – 2016-04-26 (×2): 100 mg via ORAL
  Filled 2016-04-25 (×2): qty 1

## 2016-04-25 NOTE — Progress Notes (Signed)
OT Cancellation Note  Patient Details Name: Carrie Watts MRN: [id number] DOB: [birth year]   Cancelled Treatment:    Reason Eval/Treat Not Completed: Patient at procedure or test/ unavailable  Binnie Kand M.S., OTR/L Pager: [phone]  04/25/2016, 1:22 PM

## 2016-04-25 NOTE — Evaluation (Signed)
Speech Language Pathology Evaluation Patient Details Name: Carrie Watts MRN: [id number] DOB: 09-Feb-1951 Today's Date: 04/25/2016 Time: [id number] SLP Time Calculation (min) (ACUTE ONLY): 28 min  Problem List:  Patient Active Problem List   Diagnosis Date Noted  . Saddle pulmonary embolus ([place]) 04/24/2016  . DM2 (diabetes mellitus, type 2) ([place]) 04/24/2016  . Stroke-like symptoms 04/24/2016  . Acute saddle pulmonary embolism with acute cor pulmonale ([place]) 04/24/2016  . History of cancer of left breast 04/24/2016  . Pressure injury of skin 04/24/2016   Past Medical History:  Past Medical History:  Diagnosis Date  . Cancer (HCC)    breast  . CHF (congestive heart failure) ([place])   . Diabetes mellitus without complication ([place])   . Renal disorder    Past Surgical History:  Past Surgical History:  Procedure Laterality Date  . BREAST SURGERY     HPI:  66 y/o female came to the [facility] ED on 1/14 with cough, dyspnea and chest pain in the setting of two weeks of R sided facial droop and slurred speech after returning from [place] on 1/13.  Noted to have a large PE on CT angiogram so PCCM was consulted for further evaluation.  She is being evaluated by neurology for a possible stroke.  She has received lovenox for the PE.  Neurology has ordered an MRI of the brain; CT chest on 04/24/16 indicated Positive examination for pulmonary embolus, including saddle embolus and bilateral lobar and segmental pulmonary emboli. Moderately large clot burden. Positive for acute PE with CT evidence of right heart strain (RV/LV Ratio = 1.19) consistent with at least submassive (intermediate risk) PE; MRI head on 04/24/16 noted No evidence for acute infarction, hemorrhage, mass lesion, hydrocephalus, or extra-axial fluid. Moderate atrophy. Advanced T2 and FLAIR hyperintensities throughout the white matter, representing either diabetic related chronic microvascular ischemic change or post treatment effect from  chemotherapy. Scattered areas of lacunar infarction in the deep nuclei and subcortical white matter. Brainstem and cerebellum spared.  Assessment / Plan / Recommendation Clinical Impression   Pt exhibited expressive aphasia within phrases-conversation with dysfluencies consisting of part and whole-word repetitions and minimal blocking noted throughout SLE paired with decreased overall processing of information; when pt was given increased processing time, she was able to respond appropriately to multi-step directives, personal/orientation questions and other tasks during SLE.  Denies any difficulty with dysphagia, but pt was noted to drink thin liquids via straw (per request) with minimal verbal cueing provided by SLP; pt refused solids d/t decreased appetite despite encouragement from SLP; organizational skills, reasoning skills and language/memory portions appeared decreased as well during partial administration of [facility] Cognitive Assessment); pt was "very tired" during administration of SLE, but deficits were noted overall.  Minimal right facial assymetry noted during OME, but appears functional for articulation and po consumption.  ST will continue to f/u for expressive aphasia/auditory comprehension/cognitive deficits and diet tolerance of Dysphagia 3/thin liquids while in house.    SLP Assessment  Patient needs continued Speech Language Pathology Services    Follow Up Recommendations  Other (comment) (TBD)    Frequency and Duration min 2x/week  1 week      SLP Evaluation Cognition  Overall Cognitive Status: Impaired/Different from baseline Arousal/Alertness: Awake/alert Orientation Level: Oriented X4 Memory: Impaired Memory Impairment: Decreased recall of new information;Decreased short term memory;Retrieval deficit Decreased Short Term Memory: Verbal basic;Functional basic Awareness: Appears intact Problem Solving: Appears intact Executive Function:  Organizing;Reasoning Reasoning: Impaired Reasoning Impairment: Verbal basic;Functional basic Organizing: Impaired Organizing  Impairment: Verbal basic;Functional basic Safety/Judgment: Appears intact       Comprehension  Auditory Comprehension Overall Auditory Comprehension: Impaired Yes/No Questions: Within Functional Limits Commands: Within Functional Limits Conversation: Complex Interfering Components: Processing speed EffectiveTechniques: Extra processing time Visual Recognition/Discrimination Discrimination: Within Function Limits Reading Comprehension Reading Status: Within funtional limits (with simple tasks; did not have reading glasses)    Expression Expression Primary Mode of Expression: Verbal Verbal Expression Overall Verbal Expression: Impaired Initiation: No impairment Level of Generative/Spontaneous Verbalization: Conversation Repetition: Impaired Level of Impairment: Phrase level;Sentence level Naming: Impairment Responsive: 76-100% accurate Confrontation: Within functional limits Convergent: 50-74% accurate Divergent: 50-74% accurate Other Naming Comments:  (decreased within simple conversation) Verbal Errors: Aware of errors Pragmatics: No impairment Non-Verbal Means of Communication: Not applicable Other Verbal Expression Comments:  (dysfluency noted throughout conversation) Written Expression Dominant Hand: Right Written Expression: Unable to assess (comment) (right decreased sensation)   Oral / Motor  Oral Motor/Sensory Function Overall Oral Motor/Sensory Function: Mild impairment Facial ROM: Reduced right Facial Symmetry: Abnormal symmetry right Motor Speech Overall Motor Speech: Appears within functional limits for tasks assessed Respiration: Within functional limits Phonation: Normal Resonance: Within functional limits Articulation: Impaired Level of Impairment: Sentence Intelligibility: Intelligibility reduced Word: 75-100%  accurate Phrase: 50-74% accurate Sentence: 50-74% accurate Conversation: Other (comment) Motor Planning:  (decreased d/t dysfluency)             Functional Assessment Tool Used: NOMS Functional Limitations: Spoken language expressive Spoken Language Expression Current Status [phone]): At least 40 percent but less than 60 percent impaired, limited or restricted Spoken Language Expression Goal Status [phone]): At least 20 percent but less than 40 percent impaired, limited or restricted         Harsh Trulock,PAT, M.S., CCC-SLP 04/25/2016, 3:03 PM

## 2016-04-25 NOTE — Progress Notes (Signed)
PROGRESS NOTE                                                                                                                                                                                                             Patient Demographics:    Carrie Watts, is a 66 y.o. female, DOB - [birth year], [id number]  Admit date - 04/24/2016   Admitting Physician Etta Quill, DO  Outpatient Primary MD for the patient is No primary care provider on file.  LOS - 1  Outpatient Specialists:None  Chief Complaint  Patient presents with  . difficulty speaking       Brief Narrative   66 year old female with history of diabetes mellitus, left breast cancer status post lumpectomy who recently flew back from [place] presented with right-sided facial droop with difficulty speaking, chest discomfort, cough and shortness of breath. These symptoms were going on for few days. Workup done in the ED showed a saddle pulmonary emboli. Admitted to hospitalist service on anticoagulation. PC CM consulted. Neurology consulted for strokelike symptoms.   Subjective:   Patient complains of weakness and dyspnea on exertion. Speech seems to be clear. Tachycardic with HR in 120s.   Assessment  & Plan :    Principal Problem:   Acute saddle pulmonary embolism Without acute cor pulmonale (HCC) Likely due to hypercoagulable state from underlying breast cancer and recent travel. On therapeutic Lovenox. Given her active malignancy plan to treat with Lovenox (for at least 6 months). Pulmonary consult appreciated. Pending lower extremity Dopplers. 2-D echo with bubble study normal EF, negative for RV strain or PFO. Continue telemetry monitoring. Heart rate   Active Problems:   DM2 (diabetes mellitus, type 2) (HCC) Stable on sliding scale coverage.    Stroke-like symptoms Appears to have resolved. Speech impairment seems clear. MRI been negative for  stroke and shows diffuse atrophy. Lower extremity Dopplers negative. Echo without source of emboli and negative for PFO. Carotid ultrasound and transcranial Dopplers pending. Neurology consult appreciated.   Sinus tachycardia Heart rate in 120s. Secondary to underlying PE. Continue to monitor.    History of cancer of left breast History of left breast lumpectomy one year back. Not sure if patient is on chemotherapy. We'll try to get more information from her daughter.      Code Status : Full code  Family Communication  :  Unable to reach her daughter on the phone  Disposition Plan  : SNF for PT  Barriers For Discharge : Active symptoms  Consults  :   PC CM  Procedures  :  CT angiogram of the chest MRI brain/MRA head CT head 2-D echo Carotid and lower symmetry Doppler (pending)  DVT Prophylaxis  : Therapeutic  Lovenox  Lab Results  Component Value Date   PLT 210 04/25/2016    Antibiotics  :   Anti-infectives    Start     Dose/Rate Route Frequency Ordered Stop   04/24/16 0445  vancomycin (VANCOCIN) 1,500 mg in sodium chloride 0.9 % 500 mL IVPB     1,500 mg 250 mL/hr over 120 Minutes Intravenous  Once 04/24/16 0440 04/24/16 0659   04/24/16 0445  piperacillin-tazobactam (ZOSYN) IVPB 3.375 g     3.375 g 100 mL/hr over 30 Minutes Intravenous  Once 04/24/16 0440 04/24/16 0526        Objective:   Vitals:   04/24/16 2100 04/24/16 2148 04/25/16 0102 04/25/16 0401  BP: (!) 154/84 128/76  123/61  Pulse: 100 (!) 107  (!) 104  Resp: (!) 23 18  18   Temp:  98.2 F (36.8 C) 100 F (37.8 C) 99.9 F (37.7 C)  TempSrc:  Oral Oral   SpO2: 92% 96%  91%  Weight:  89 kg (196 lb 3.4 oz)    Height:  5\' 3"  (1.6 m)      Wt Readings from Last 3 Encounters:  04/24/16 89 kg (196 lb 3.4 oz)     Intake/Output Summary (Last 24 hours) at 04/25/16 1212 Last data filed at 04/24/16 2240  Gross per 24 hour  Intake              800 ml  Output              100 ml  Net               700 ml     Physical Exam  Gen: not in distress HEENT: , moist mucosa, supple neck Chest: clear b/l, no added sounds CVS: S1 and S2 tachycardic, no murmurs rub or gallop GI: soft, NT, ND Musculoskeletal: warm, no edema CNS: Alert and oriented, speech appears clear, mild left facial droop,    Data Review:    CBC  Recent Labs Lab 04/24/16 0007 04/24/16 0027 04/25/16 0426  WBC 8.7  --  11.2*  HGB 10.1* 10.2* 8.4*  HCT 29.3* 30.0* 25.1*  PLT 216  --  210  MCV 89.3  --  92.3  MCH 30.8  --  30.9  MCHC 34.5  --  33.5  RDW 15.5  --  16.6*  LYMPHSABS 1.4  --   --   MONOABS 1.0  --   --   EOSABS 0.1  --   --   BASOSABS 0.0  --   --     Chemistries   Recent Labs Lab 04/24/16 0007 04/24/16 0027 04/25/16 0426  NA 130* 130* 139  K 4.0 3.9 3.8  CL 100* 101 111  CO2 19*  --  19*  GLUCOSE 324* 334* 117*  BUN 14 14 5*  CREATININE 1.06* 0.90 0.85  CALCIUM 9.5  --  8.8*  AST 30  --   --   ALT 22  --   --   ALKPHOS 83  --   --   BILITOT 0.6  --   --    ------------------------------------------------------------------------------------------------------------------  Recent  Labs  04/25/16 0426  CHOL 196  HDL 38*  LDLCALC 127*  TRIG 154*  CHOLHDL 5.2    No results found for: HGBA1C ------------------------------------------------------------------------------------------------------------------ No results for input(s): TSH, T4TOTAL, T3FREE, THYROIDAB in the last 72 hours.  Invalid input(s): FREET3 ------------------------------------------------------------------------------------------------------------------ No results for input(s): VITAMINB12, FOLATE, FERRITIN, TIBC, IRON, RETICCTPCT in the last 72 hours.  Coagulation profile  Recent Labs Lab 04/24/16 0007  INR 1.21    No results for input(s): DDIMER in the last 72 hours.  Cardiac Enzymes No results for input(s): CKMB, TROPONINI, MYOGLOBIN in the last 168 hours.  Invalid input(s):  CK ------------------------------------------------------------------------------------------------------------------ No results found for: BNP  Inpatient Medications  Scheduled Meds: .  stroke: mapping our early stages of recovery book   Does not apply Once  . enoxaparin (LOVENOX) injection  90 mg Subcutaneous Q12H  . furosemide  40 mg Intravenous Once  . insulin aspart  0-9 Units Subcutaneous Q4H   Continuous Infusions: PRN Meds:.acetaminophen **OR** acetaminophen (TYLENOL) oral liquid 160 mg/5 mL **OR** acetaminophen, docusate sodium  Micro Results Recent Results (from the past 240 hour(s))  MRSA PCR Screening     Status: None   Collection Time: 04/24/16  6:31 AM  Result Value Ref Range Status   MRSA by PCR NEGATIVE NEGATIVE Final    Comment:        The GeneXpert MRSA Assay (FDA approved for NASAL specimens only), is one component of a comprehensive MRSA colonization surveillance program. It is not intended to diagnose MRSA infection nor to guide or monitor treatment for MRSA infections.     Radiology Reports Dg Chest 2 View  Result Date: 04/24/2016 CLINICAL DATA:  Cough. History of hypertension and CHF. History of breast cancer. Patient has had loss of function on the right side over body and has difficulty speaking. EXAM: CHEST  2 VIEW COMPARISON:  None. FINDINGS: Normal heart size and pulmonary vascularity. No focal airspace disease or consolidation in the lungs. No blunting of costophrenic angles. No pneumothorax. Mediastinal contours appear intact. Surgical clips in the left axilla. Degenerative changes in the spine. IMPRESSION: No active cardiopulmonary disease. Electronically Signed   By: Lucienne Capers M.D.   On: 04/24/2016 01:33   Ct Head Wo Contrast  Result Date: 04/24/2016 CLINICAL DATA:  Acute onset of slurred speech and bilateral leg weakness. Initial encounter. EXAM: CT HEAD WITHOUT CONTRAST TECHNIQUE: Contiguous axial images were obtained from the base of  the skull through the vertex without intravenous contrast. COMPARISON:  None. FINDINGS: Brain: No evidence of acute infarction, hemorrhage, hydrocephalus, extra-axial collection or mass lesion/mass effect. Prominence of the ventricles and sulci reflects mild cortical volume loss. Scattered periventricular subcortical white matter change likely reflects small vessel ischemic microangiopathy. A small chronic lacunar infarct is noted at the right basal ganglia. The brainstem and fourth ventricle are within normal limits. The cerebral hemispheres demonstrate grossly normal gray-white differentiation. No mass effect or midline shift is seen. Vascular: No hyperdense vessel or unexpected calcification. Skull: There is no evidence of fracture; visualized osseous structures are unremarkable in appearance. Sinuses/Orbits: The orbits are within normal limits. The paranasal sinuses and mastoid air cells are well-aerated. Other: No significant soft tissue abnormalities are seen. IMPRESSION: 1. No acute intracranial pathology seen on CT. 2. Mild cortical volume loss and scattered small vessel ischemic microangiopathy. 3. Small chronic lacunar infarct at the right basal ganglia. Electronically Signed   By: Garald Balding M.D.   On: 04/24/2016 01:54   Ct Angio Chest Pe W And/or  Wo Contrast  Result Date: 04/24/2016 CLINICAL DATA:  Cough, chest pain, and shortness of breath for 2 weeks. History of breast cancer post left mastectomy. Chemotherapy. Multiple recent strokes. Diabetes. EXAM: CT ANGIOGRAPHY CHEST WITH CONTRAST TECHNIQUE: Multidetector CT imaging of the chest was performed using the standard protocol during bolus administration of intravenous contrast. Multiplanar CT image reconstructions and MIPs were obtained to evaluate the vascular anatomy. CONTRAST:  100 mL Isovue 370 COMPARISON:  None. FINDINGS: Cardiovascular: Filling defects demonstrated in the main and bilateral lobar pulmonary artery is extending into  bilateral upper and lower lobe branches consistent with saddle embolus with moderately large clot burden. The RV to LV ratio is elevated at 1.19, suggesting risk of right heart strain. No pericardial effusion. Dilated ascending thoracic aorta at 3.5 cm. No dissection. Scattered calcification. Mediastinum/Nodes: Small esophageal hiatal hernia. Esophagus is decompressed. No significant lymphadenopathy in the chest. Lungs/Pleura: Evaluation is limited due to motion artifact. There patchy nodular ground-glass infiltrative changes demonstrated centrally in the lungs, in the lung bases, and scattered throughout the upper lungs. This may be due to multifocal pneumonia, edema, or infarcts. Metastatic disease would be a less likely consideration. No pleural effusions. No pneumothorax. Upper Abdomen: No acute abnormality. Musculoskeletal: Postoperative changes consistent with left mastectomy and surgical clips in the left axilla. No destructive bone lesions. Review of the MIP images confirms the above findings. IMPRESSION: Positive examination for pulmonary embolus, including saddle embolus and bilateral lobar and segmental pulmonary emboli. Moderately large clot burden. Positive for acute PE with CT evidence of right heart strain (RV/LV Ratio = 1.19) consistent with at least submassive (intermediate risk) PE. The presence of right heart strain has been associated with an increased risk of morbidity and mortality. Please activate Code PE by paging [phone]. Patchy nodular ground-glass infiltrative changes in the lungs may be due to multifocal pneumonia, edema, or infarcts. Metastatic disease would be a less likely consideration but due to history of primary carcinoma, non-contrast chest CT at 3-6 months is recommended. If nodules persist, subsequent management will be based upon the most suspicious nodule(s). This recommendation follows the consensus statement: Guidelines for Management of Incidental Pulmonary Nodules  Detected on CT Images: From the Fleischner Society 2017; Radiology 2017; 284:228-243. These results were called by telephone at the time of interpretation on 04/24/2016 at 4:26 am to Dr. Deno Etienne , who verbally acknowledged these results. Electronically Signed   By: Lucienne Capers M.D.   On: 04/24/2016 04:32   Mr Brain Wo Contrast  Result Date: 04/24/2016 CLINICAL DATA:  Right-sided facial droop and difficulty speaking, symptoms for 1 week. History of breast cancer currently on chemotherapy. Large saddle embolus on CT chest. Query paradoxical emboli. History of diabetes. EXAM: MRI HEAD WITHOUT CONTRAST MRA HEAD WITHOUT CONTRAST TECHNIQUE: Multiplanar, multiecho pulse sequences of the brain and surrounding structures were obtained without intravenous contrast. Angiographic images of the head were obtained using MRA technique without contrast. COMPARISON:  CT chest 04/24/2016.  CT head 04/24/2016. FINDINGS: MRI HEAD FINDINGS Brain: No evidence for acute infarction, hemorrhage, mass lesion, hydrocephalus, or extra-axial fluid. Moderate atrophy. Advanced T2 and FLAIR hyperintensities throughout the white matter, representing either diabetic related chronic microvascular ischemic change or post treatment effect from chemotherapy. Scattered areas of lacunar infarction in the deep nuclei and subcortical white matter. Brainstem and cerebellum spared. 9 mm of tonsillar descent below the foramen magnum consistent with Chiari I malformation. No visible hydromyelia. Partial empty sella. Vascular: Normal flow voids. Skull and upper cervical spine: Normal  marrow signal. Large disc extrusion at C3-4, evaluated only on sagittal images, causing significant cord compression. Consider cervical spine MRI for further evaluation. Sinuses/Orbits: Negative. Other: None. MRA HEAD FINDINGS The internal carotid arteries are widely patent. Basilar artery is widely patent with vertebrals codominant. There is no intracranial stenosis or  visible aneurysm. IMPRESSION: Moderate atrophy with advanced nonacute white matter signal abnormality. No visible acute stroke. Chiari I malformation. 9 mm tonsillar descent without visible hydromyelia. Within limits for assessment on noncontrast brain, no intracranial metastatic disease or visible osseous disease. No intracranial stenosis or occlusion. Electronically Signed   By: Staci Righter M.D.   On: 04/24/2016 13:34   Mr Jodene Nam Head/brain [id number] Cm  Result Date: 04/24/2016 CLINICAL DATA:  Right-sided facial droop and difficulty speaking, symptoms for 1 week. History of breast cancer currently on chemotherapy. Large saddle embolus on CT chest. Query paradoxical emboli. History of diabetes. EXAM: MRI HEAD WITHOUT CONTRAST MRA HEAD WITHOUT CONTRAST TECHNIQUE: Multiplanar, multiecho pulse sequences of the brain and surrounding structures were obtained without intravenous contrast. Angiographic images of the head were obtained using MRA technique without contrast. COMPARISON:  CT chest 04/24/2016.  CT head 04/24/2016. FINDINGS: MRI HEAD FINDINGS Brain: No evidence for acute infarction, hemorrhage, mass lesion, hydrocephalus, or extra-axial fluid. Moderate atrophy. Advanced T2 and FLAIR hyperintensities throughout the white matter, representing either diabetic related chronic microvascular ischemic change or post treatment effect from chemotherapy. Scattered areas of lacunar infarction in the deep nuclei and subcortical white matter. Brainstem and cerebellum spared. 9 mm of tonsillar descent below the foramen magnum consistent with Chiari I malformation. No visible hydromyelia. Partial empty sella. Vascular: Normal flow voids. Skull and upper cervical spine: Normal marrow signal. Large disc extrusion at C3-4, evaluated only on sagittal images, causing significant cord compression. Consider cervical spine MRI for further evaluation. Sinuses/Orbits: Negative. Other: None. MRA HEAD FINDINGS The internal carotid arteries  are widely patent. Basilar artery is widely patent with vertebrals codominant. There is no intracranial stenosis or visible aneurysm. IMPRESSION: Moderate atrophy with advanced nonacute white matter signal abnormality. No visible acute stroke. Chiari I malformation. 9 mm tonsillar descent without visible hydromyelia. Within limits for assessment on noncontrast brain, no intracranial metastatic disease or visible osseous disease. No intracranial stenosis or occlusion. Electronically Signed   By: Staci Righter M.D.   On: 04/24/2016 13:34    Time Spent in minutes  35   Louellen Molder M.D on 04/25/2016 at 12:12 PM  Between 7am to 7pm - Pager - [phone]  After 7pm go to [email or web address] - password [facility]  [facility] -  Office  [phone]

## 2016-04-25 NOTE — Evaluation (Signed)
Physical Therapy Evaluation Patient Details Name: Carrie Watts MRN: [id number] DOB: Jul 22, 1950 Today's Date: 04/25/2016   History of Present Illness  66 y/o female came [facility] ED on 1/14 with cough, dyspnea and chest pain in the setting of two weeks of R sided facial droop and slurred speech after returning from [place] on 1/13. Noted to have a large PE on CT angiogram. MRI and MRA negative.  Clinical Impression  Pt functioning at a mina. Unsure of patients support system and home setup. Attempted to contact family but unable to reach. Pt SpO2 dec to 87% on RA with amb of 10', 91-94% on RA when sitting or lying. Acute PT to follow.    Follow Up Recommendations SNF;Supervision/Assistance - 24 hour (pt may progress to HHPT with 24/7 supervision)    Equipment Recommendations  None recommended by PT    Recommendations for Other Services       Precautions / Restrictions Precautions Precautions: Fall Precaution Comments: SOB with activity Restrictions Weight Bearing Restrictions: No      Mobility  Bed Mobility Overal bed mobility: Needs Assistance Bed Mobility: Rolling;Sidelying to Sit Rolling: Supervision Sidelying to sit: Min guard       General bed mobility comments: min/mod v/c's, used bed rail with cues, v/c's t push up with L elbow  Transfers Overall transfer level: Needs assistance Equipment used: Rolling walker (2 wheeled) Transfers: Sit to/from Stand Sit to Stand: Min assist         General transfer comment: max v/c's for safe hand placement, minA for initial power up  Ambulation/Gait Ambulation/Gait assistance: Min assist Ambulation Distance (Feet): 10 Feet Assistive device: Rolling walker (2 wheeled) Gait Pattern/deviations: Step-through pattern;Decreased stride length;Narrow base of support Gait velocity: slow Gait velocity interpretation: Below normal speed for age/gender General Gait Details: slow, nearly cross-over gait pattern  Stairs            Wheelchair Mobility    Modified Rankin (Stroke Patients Only)       Balance Overall balance assessment: Needs assistance Sitting-balance support: Feet supported;Single extremity supported Sitting balance-Leahy Scale: Fair     Standing balance support: Bilateral upper extremity supported Standing balance-Leahy Scale: Poor Standing balance comment: needs UE support for safe standing                             Pertinent Vitals/Pain Pain Assessment: No/denies pain    Home Living Family/patient expects to be discharged to:: Unsure                 Additional Comments: pt reports she lives in [place]  1/2 the time and here 1/2 the time. Difficult to follow patients report, attempted to call daughter and she did not answer. Someone answered home phone but could not provide information. Pt reports receiving all her chemo and having her L masectomy down in [place]. Pt unable to provide home set up or actual support she would have at home.    Prior Function           Comments: unsure, was unable to connect with family. pt reports recently using a RW since she's been sick     Hand Dominance        Extremity/Trunk Assessment   Upper Extremity Assessment Upper Extremity Assessment: Generalized weakness    Lower Extremity Assessment Lower Extremity Assessment: Generalized weakness    Cervical / Trunk Assessment Cervical / Trunk Assessment: Normal  Communication   Communication:  Prefers language other than English  Cognition Arousal/Alertness: Lethargic (sleepy) Behavior During Therapy: Flat affect Overall Cognitive Status: Impaired/Different from baseline Area of Impairment: Problem solving             Problem Solving: Slow processing;Difficulty sequencing General Comments: pt with noted language barrier however pt with confusing report of PLOF and living situation. unsure of accuracy. attempted to call family however unable to reach  them    General Comments General comments (skin integrity, edema, etc.): bilat LE edema, L UE edema    Exercises     Assessment/Plan    PT Assessment Patient needs continued PT services  PT Problem List Decreased strength;Decreased activity tolerance;Decreased mobility;Decreased balance          PT Treatment Interventions DME instruction;Gait training;Stair training;Functional mobility training;Therapeutic activities;Therapeutic exercise    PT Goals (Current goals can be found in the Care Plan section)  Acute Rehab PT Goals Patient Stated Goal: didn't state PT Goal Formulation: With patient Time For Goal Achievement: 05/02/16 Potential to Achieve Goals: Good    Frequency Min 3X/week   Barriers to discharge Decreased caregiver support unsure of home set up and support    Co-evaluation               End of Session Equipment Utilized During Treatment: Gait belt Activity Tolerance: Patient tolerated treatment well Patient left: in chair;with call bell/phone within reach;with chair alarm set Nurse Communication: Mobility status         Time: [id number] PT Time Calculation (min) (ACUTE ONLY): 27 min   Charges:   PT Evaluation $PT Eval Moderate Complexity: 1 Procedure PT Treatments $Gait Training: 8-22 mins   PT G CodesKoleen Distance Arietta Watts 04/25/2016, 9:33 AM   Carrie Watts, PT, DPT Pager #: [phone] Office #: [phone]

## 2016-04-25 NOTE — Progress Notes (Signed)
*  PRELIMINARY RESULTS* Vascular Ultrasound Carotid Duplex (Doppler) has been completed.   Findings suggest 1-39% internal carotid artery stenosis bilaterally. Vertebral arteries are patent with antegrade flow.  Transcranial Doppler Bubble study has been completed with Dr. Erlinda Hong. There is no obvious evidence of High Intensity Transient Signals (HITS) at rest and with Valsalva maneuver, therefore there is no evidence of Patent Foramen Ovale (PFO).  Bilateral lower extremity venous duplex completed. The right lower extremity is positive for acute deep vein thrombosis involving the right saphenofemoral junction, femoral vein, popliteal vein, posterior tibial veins, and peroneal veins. There is no evidence of left deep vein thrombosis or bilateral Baker's cyst.  Preliminary results discussed with Dr. Erlinda Hong.  04/25/2016 12:22 PM Maudry Mayhew, BS, RVT, RDCS, RDMS

## 2016-04-25 NOTE — Progress Notes (Signed)
OT Cancellation Note  Patient Details Name: Carrie Watts MRN: [id number] DOB: Apr 25, 1950   Cancelled Treatment:    Reason Eval/Treat Not Completed: Other (comment) (pt currently with SLP). 2nd attempt at OT eval today. Will continue to follow up as time allows.  Binnie Kand M.S., OTR/L Pager: [phone]  04/25/2016, 2:27 PM

## 2016-04-25 NOTE — Care Management Note (Addendum)
Case Management Note  Patient Details  Name: Carrie Watts MRN: [id number] Date of Birth: Mar 15, 1951  Subjective/Objective:                 Patient visiting daughter from [place], [place]. Originally anticipated to stay about 3 weeks per daughter Hiram Gash, however patient became sick, admitted with saddle PE. Patient currently on Lovenox. Patient does not have insurance or PCP locally. Is under treatment in [place] for breast CA per daughter. Patient will recover with daughter prior to returning to [place]. Daughter not sure at this time how long patient will stay.   Action/Plan:  Will need [place] letter, to cover Lovenox etc.  [facility]   Addendum: Spoke to patient's daughter Guy Begin. She states that patient can get Lovenox at a pharmacy in [place], that she will be able to afford it. Through their healthcare system, patient's medications are discounted and affordable. She could not provide a pharmacy's phone number to call to verify this. She states that she herself as a nurse will be able to administer shots at home and teach patient. She states that the patient will be able to administer Lovenox to herself as well as have family to assist with administration in [place]. Spoke with daughter about [facility] resources being self pay, declined. She had questions about home med list, activity parameters at DC and test results. CM paged Dr Clementeen Graham to call daughter. Spoke with [employer] [place] pharmacist, she stated she will enter patient and cost override in [facility] system to approve 30 days of Lovenox. CM will print out card and map and phone number to fill Lovenox at OP pharmacy if patient would like to use [facility] program.  04-29-16 APt made to est PCP with [facility] on 05-23-16 at 2pm. Per Dr Mellody Dance ([facility]) patient will DC on DOAC for 30 days (will be covered by coupon, MD not sure of which DOAC, so will need coupon at DC) and then transition to coumadin (managed by [facility]). Patient to  have desat test to eval for O2 requirement prior to discharge. Spoke with daughter, she is not sure how long she is going to keep mom with her, may be up to 3 months. Referred to CSW for assistance for medicaid coverage, or information on temporary/ emergency medicaid. 1/23 Per Dr Candiss Norse patient will DC with Eliquis. CM notified Outpatient pharmacy so they would not order month supply of Lovenox, CM cancelled [facility] apt. Transition liaison Opal Sidles B at [facility] able to schedule appt on 1/25. CM notified daughter Guy Begin of apt change and verified she still had Eliquis card. Left brochure with appointment time on it at bedside. Guy Begin stated she was not aware of DC today, nor the change in plan with anticoags. Requested to speak to Dr Candiss Norse, CM text paged Renetta Chalk number to Dr Candiss Norse with request for him to call her.   Expected Discharge Date:                  Expected Discharge Plan:  Home/Self Care  In-House Referral:     Discharge planning Services  CM Consult  Post Acute Care Choice:    Choice offered to:     DME Arranged:    DME Agency:     HH Arranged:    HH Agency:     Status of Service:  In process, will continue to follow  If discussed at Long Length of Stay Meetings, dates discussed:    Additional Comments:  Jackelyn Poling  Lakelyn Straus, RN 04/25/2016, 2:08 PM

## 2016-04-25 NOTE — Progress Notes (Signed)
STROKE TEAM PROGRESS NOTE   SUBJECTIVE (INTERVAL HISTORY) Pt was seen at vascular lab. Her TCD Bubble study negative for PFO. However, She was found to have right LE DVT. She still on Lovenox therapeutic dose.   OBJECTIVE Temp:  [98 F (36.7 C)-100 F (37.8 C)] 98 F (36.7 C) (01/15 1442) Pulse Rate:  [91-107] 96 (01/15 1442) Cardiac Rhythm: Sinus tachycardia (01/15 0700) Resp:  [18-27] 19 (01/15 1442) BP: (123-154)/(61-84) 128/77 (01/15 1442) SpO2:  [91 %-96 %] 96 % (01/15 1442) Weight:  [196 lb 3.4 oz (89 kg)] 196 lb 3.4 oz (89 kg) (01/14 2148)  CBC:   Recent Labs Lab 04/24/16 0007 04/24/16 0027 04/25/16 0426  WBC 8.7  --  11.2*  NEUTROABS 6.3  --   --   HGB 10.1* 10.2* 8.4*  HCT 29.3* 30.0* 25.1*  MCV 89.3  --  92.3  PLT 216  --  [id number]    Basic Metabolic Panel:   Recent Labs Lab 04/24/16 0007 04/24/16 0027 04/25/16 0426  NA 130* 130* 139  K 4.0 3.9 3.8  CL 100* 101 111  CO2 19*  --  19*  GLUCOSE 324* 334* 117*  BUN 14 14 5*  CREATININE 1.06* 0.90 0.85  CALCIUM 9.5  --  8.8*    Lipid Panel:     Component Value Date/Time   CHOL 196 04/25/2016 0426   TRIG 154 (H) 04/25/2016 0426   HDL 38 (L) 04/25/2016 0426   CHOLHDL 5.2 04/25/2016 0426   VLDL 31 04/25/2016 0426   LDLCALC 127 (H) 04/25/2016 0426   HgbA1c: No results found for: HGBA1C Urine Drug Screen: No results found for: LABOPIA, COCAINSCRNUR, LABBENZ, AMPHETMU, THCU, LABBARB    IMAGING I have personally reviewed the radiological images below and agree with the radiology interpretations.  Dg Chest 2 View 04/24/2016 No active cardiopulmonary disease.   Ct Head Wo Contrast 04/24/2016 1. No acute intracranial pathology seen on CT.  2. Mild cortical volume loss and scattered small vessel ischemic microangiopathy.  3. Small chronic lacunar infarct at the right basal ganglia.   Ct Angio Chest Pe W And/or Wo Contrast 04/24/2016 Positive examination for pulmonary embolus, including saddle embolus  and bilateral lobar and segmental pulmonary emboli. Moderately large clot burden. Positive for acute PE with CT evidence of right heart strain (RV/LV Ratio = 1.19) consistent with at least submassive (intermediate risk) PE. The presence of right heart strain has been associated with an increased risk of morbidity and mortality. Please activate Code PE by paging [phone].  Patchy nodular ground-glass infiltrative changes in the lungs may be due to multifocal pneumonia, edema, or infarcts. Metastatic disease would be a less likely consideration but due to history of primary carcinoma, non-contrast chest CT at 3-6 months is recommended.  If nodules persist, subsequent management will be based upon the most suspicious nodule(s).   Mr Brain Wo Contrast 04/24/2016 IMPRESSION: Moderate atrophy with advanced nonacute white matter signal abnormality. No visible acute stroke. Chiari I malformation. 9 mm tonsillar descent without visible hydromyelia. Within limits for assessment on noncontrast brain, no intracranial metastatic disease or visible osseous disease. No intracranial stenosis or occlusion.   TCD bubble study - negative for PFO  CUS 1-39% internal carotid artery stenosis bilaterally. Vertebral arteries are patent with antegrade flow.  TTE  Normal LV size and systolic function, EF 0000000. Normal RV size   and systolic function. No significant valvular abnormalities.   Negative bubble study.  LE venous doppler - positive for right  LE DVT   PHYSICAL EXAM  Temp:  [98 F (36.7 C)-100 F (37.8 C)] 98 F (36.7 C) (01/15 1442) Pulse Rate:  [91-107] 96 (01/15 1442) Resp:  [18-27] 19 (01/15 1442) BP: (123-154)/(61-84) 128/77 (01/15 1442) SpO2:  [91 %-96 %] 96 % (01/15 1442) Weight:  [196 lb 3.4 oz (89 kg)] 196 lb 3.4 oz (89 kg) (01/14 2148)  General - Well nourished, well developed, in no apparent distress.  Ophthalmologic - Fundi not visualized due to eye movement.  Cardiovascular -  Regular rate and rhythm.  Mental Status -  Level of arousal and orientation to time, place, and person were intact. Language including naming, repetition, comprehension was assessed and found intact, however, significant stuttering speech, concerning for functional speech instead of aphasia. Fund of Knowledge was assessed and was intact.  Cranial Nerves II - XII - II - Visual field intact OU. III, IV, VI - Extraocular movements intact. V - Facial sensation intact bilaterally. VII - Facial movement intact bilaterally. VIII - Hearing & vestibular intact bilaterally. X - Palate elevates symmetrically. XI - Chin turning & shoulder shrug intact bilaterally. XII - Tongue protrusion intact.  Motor Strength - The patient's strength was 4/5 and symmetrical in all extremities and pronator drift was absent.  Bulk was normal and fasciculations were absent.   Motor Tone - Muscle tone was assessed at the neck and appendages and was normal.  Reflexes - The patient's reflexes were 1+ in all extremities and she had no pathological reflexes.  Sensory - Light touch, temperature/pinprick were assessed and were symmetrical.    Coordination - The patient had normal movements in the hands with no ataxia or dysmetria, but slow on movement.  Tremor was absent.  Gait and Station - deferred.   ASSESSMENT/PLAN Ms. Carrie Watts is a 66 y.o. female with history of breast CA, CHF, CKD, and DM  presenting with one week history of right sided weakness and speech difficulties.. She did not receive IV t-PA due to late presentation.  PE with saddle embolus and right heart strain - ? related to long flight travel vs. Hypercoagulable state secondary to breast cancer  CTA chest showed saddle embolus and bilateral lobar and segmental pulmonary emboli as well as right heart strain  LE venous doppler right LE DVT  TCD bubble study no PFO  On lovenox therapeutic dose  Treatment per primary team  Stuttering speech -  pt baseline vs. Acute anxiety  MRI - no acute stroke  MRA - unremarkable  Carotid Doppler - unremarkable  2D Echo - EF 55-60%  TCD bubble study will  LDL - 127  HgbA1c - pending  VTE prophylaxis - Lovenox DIET DYS 3 Room service appropriate? Yes; Fluid consistency: Thin  aspirin 81 mg daily prior to admission, now on Lovenox full dose.   Therapy recommendations: pending  Disposition:  Pending  Chiari malformation  9 mm tonsillar descent without visible hydromyelia  Asymptomatic  Continue observation vs outpatient neurosurgery follow-up  Hypertension  Stable  Permissive hypertension (OK if < 180/105) but gradually normalize in 5-7 days  Long-term BP goal normotensive  Hyperlipidemia  Home meds: No lipid lowering medications PTA  LDL127, goal < 70  Add Lipitor 20   Continue statin at discharge  Diabetes  HgbA1c - pending, goal < 7.0  Uncontrolled  Hyperglycemia  SSI  CBG monitoring  Other Stroke Risk Factors  Advanced age  Obesity, Body mass index is 34.76 kg/m., recommend weight loss, diet and exercise as appropriate  Hx stroke/TIA - right BG old infarct by imaging  Other Active Problems  Family history of clotting disorder  Anemia - 10.2 / 30  hyponatremia - 130  Breast cancer - s/p mastectomy and undergoing chemotherapy  To rule out metastatic disease on chest CT - F/U non contrast chest CT in 3 - 6 months.  Hospital day # 1  Neurology will sign off. Please call with questions. No neurology follow-up needed at this time. Thanks for the consult.  Rosalin Hawking, MD PhD Stroke Neurology 04/25/2016 6:37 PM   To contact Stroke Continuity provider, please refer to [email or web address] After hours, contact General Neurology

## 2016-04-26 DIAGNOSIS — I82491 Acute embolism and thrombosis of other specified deep vein of right lower extremity: Secondary | ICD-10-CM

## 2016-04-26 DIAGNOSIS — I2692 Saddle embolus of pulmonary artery without acute cor pulmonale: Secondary | ICD-10-CM | POA: Diagnosis not present

## 2016-04-26 DIAGNOSIS — I130 Hypertensive heart and chronic kidney disease with heart failure and stage 1 through stage 4 chronic kidney disease, or unspecified chronic kidney disease: Secondary | ICD-10-CM | POA: Diagnosis not present

## 2016-04-26 DIAGNOSIS — I82401 Acute embolism and thrombosis of unspecified deep veins of right lower extremity: Secondary | ICD-10-CM | POA: Diagnosis not present

## 2016-04-26 DIAGNOSIS — Z853 Personal history of malignant neoplasm of breast: Secondary | ICD-10-CM | POA: Diagnosis not present

## 2016-04-26 DIAGNOSIS — I509 Heart failure, unspecified: Secondary | ICD-10-CM | POA: Diagnosis not present

## 2016-04-26 DIAGNOSIS — E1122 Type 2 diabetes mellitus with diabetic chronic kidney disease: Secondary | ICD-10-CM | POA: Diagnosis not present

## 2016-04-26 DIAGNOSIS — E871 Hypo-osmolality and hyponatremia: Secondary | ICD-10-CM | POA: Diagnosis not present

## 2016-04-26 DIAGNOSIS — I639 Cerebral infarction, unspecified: Secondary | ICD-10-CM | POA: Diagnosis not present

## 2016-04-26 DIAGNOSIS — I2602 Saddle embolus of pulmonary artery with acute cor pulmonale: Secondary | ICD-10-CM | POA: Diagnosis not present

## 2016-04-26 LAB — CBC
HEMATOCRIT: 24.7 % — AB (ref 36.0–46.0)
HEMOGLOBIN: 8.2 g/dL — AB (ref 12.0–15.0)
MCH: 30.1 pg (ref 26.0–34.0)
MCHC: 33.2 g/dL (ref 30.0–36.0)
MCV: 90.8 fL (ref 78.0–100.0)
Platelets: 226 10*3/uL (ref 150–400)
RBC: 2.72 MIL/uL — AB (ref 3.87–5.11)
RDW: 16.7 % — ABNORMAL HIGH (ref 11.5–15.5)
WBC: 9.3 10*3/uL (ref 4.0–10.5)

## 2016-04-26 LAB — GLUCOSE, CAPILLARY
GLUCOSE-CAPILLARY: 113 mg/dL — AB (ref 65–99)
GLUCOSE-CAPILLARY: 126 mg/dL — AB (ref 65–99)
GLUCOSE-CAPILLARY: 190 mg/dL — AB (ref 65–99)
Glucose-Capillary: 139 mg/dL — ABNORMAL HIGH (ref 65–99)
Glucose-Capillary: 198 mg/dL — ABNORMAL HIGH (ref 65–99)
Glucose-Capillary: 235 mg/dL — ABNORMAL HIGH (ref 65–99)

## 2016-04-26 LAB — VAS US CAROTID
LCCAPDIAS: 20 cm/s
LEFT ECA DIAS: -16 cm/s
LEFT VERTEBRAL DIAS: 14 cm/s
Left CCA dist dias: -13 cm/s
Left CCA dist sys: -52 cm/s
Left CCA prox sys: 69 cm/s
Left ICA dist dias: -34 cm/s
Left ICA dist sys: -103 cm/s
Left ICA prox dias: 20 cm/s
Left ICA prox sys: 58 cm/s
RCCADSYS: -97 cm/s
RCCAPDIAS: 11 cm/s
RIGHT ECA DIAS: 16 cm/s
RIGHT VERTEBRAL DIAS: 19 cm/s
Right CCA prox sys: 62 cm/s

## 2016-04-26 LAB — HEMOGLOBIN A1C
Hgb A1c MFr Bld: 12.9 % — ABNORMAL HIGH (ref 4.8–5.6)
MEAN PLASMA GLUCOSE: 324 mg/dL

## 2016-04-26 MED ORDER — ALUM & MAG HYDROXIDE-SIMETH 200-200-20 MG/5ML PO SUSP
30.0000 mL | Freq: Four times a day (QID) | ORAL | Status: DC | PRN
  Administered 2016-04-27 – 2016-04-30 (×3): 30 mL via ORAL
  Filled 2016-04-26 (×3): qty 30

## 2016-04-26 NOTE — Progress Notes (Addendum)
PROGRESS NOTE                                                                                                                                                                                                             Patient Demographics:    Carrie Watts, is a 66 y.o. female, DOB - 1950-08-29, [id number]  Admit date - 04/24/2016   Admitting Physician Etta Quill, DO  Outpatient Primary MD for the patient is No primary care provider on file.  LOS - 2  Outpatient Specialists:None  Chief Complaint  Patient presents with  . difficulty speaking       Brief Narrative   66 year old female with history of diabetes mellitus, left breast cancer status post lumpectomy who recently flew back from [place] presented with right-sided facial droop with difficulty speaking, chest discomfort, cough and shortness of breath. These symptoms were going on for few days. Workup done in the ED showed a saddle pulmonary emboli. Admitted to hospitalist service on anticoagulation. PC CM consulted. Neurology consulted for strokelike symptoms.   Subjective:   Complains of feeling very weak. Still dyspneic on exertion. Heart rate occasionally jumped up to 120s when out of bed (improved from yesterday)  Assessment  & Plan :    Principal Problem:   Acute saddle pulmonary embolism without acute cor pulmonale (HCC) Likely due to hypercoagulable state from underlying breast cancer and recent travel. On therapeutic Lovenox. Given her clot burden and significant right leg DVT she will need lifelong anticoagulation. Lower extremity Doppler shows acute DVT involving right saphenofemoral junction, femoral vein, popliteal vein, posterior tibial and peroneal veins. No DVT on left leg. Discussed with both pulmonary( Dr Creig Hines)  and hematology ( Dr Alvy Bimler),  no role for IVC filter. Dr. Alvy Bimler recommended we could give her one month free supply of  Xarelto and when she follows up with her oncologist and treated at could be switched to appropriate anticoagulation.   2-D echo with bubble study normal EF, negative for RV strain or PFO. Continue telemetry monitoring.  Active Problems:   DM2 (diabetes mellitus, type 2) (HCC) Stable on sliding scale coverage.    Stroke-like symptoms Right-sided weakness and slurred speech may have been associated with anxiety. Workup was all negative. Neurology signed off.   Sinus tachycardia Secondary to underlying PE. I treated improved today. Continue to  monitor.    History of cancer of left breast History of left breast lumpectomy one year back, currently on chemotherapy. Follows with oncologist in [place].  Newly diagnosed diabetes mellitus, uncontrolled  A1C of 12.9. Will be needing oral hypoglycemic and ? Insulin. Monitor on SSI for now.    Code Status : Full code  Family Communication  : Discussed with daughter on the phone  Disposition Plan  : SNF for PT. Daughter would like to keep her at her place on cc stable to go back to trained at.  Barriers For Discharge : Active symptoms  Consults  :   PC CM D/w Dr. Alvy Bimler on the phone  Procedures  :  CT angiogram of the chest MRI brain/MRA head CT head 2-D echo Carotid and lower symmetry Doppler  DVT Prophylaxis  : Therapeutic  Lovenox  Lab Results  Component Value Date   PLT 226 04/26/2016    Antibiotics  :   Anti-infectives    Start     Dose/Rate Route Frequency Ordered Stop   04/24/16 0445  vancomycin (VANCOCIN) 1,500 mg in sodium chloride 0.9 % 500 mL IVPB     1,500 mg 250 mL/hr over 120 Minutes Intravenous  Once 04/24/16 0440 04/24/16 0659   04/24/16 0445  piperacillin-tazobactam (ZOSYN) IVPB 3.375 g     3.375 g 100 mL/hr over 30 Minutes Intravenous  Once 04/24/16 0440 04/24/16 0526        Objective:   Vitals:   04/25/16 0401 04/25/16 1442 04/25/16 2122 04/26/16 0240  BP: 123/61 128/77 137/77 115/76  Pulse:  (!) 104 96 98 (!) 108  Resp: 18 19 (!) 24 18  Temp: 99.9 F (37.7 C) 98 F (36.7 C) 99.5 F (37.5 C) 98.4 F (36.9 C)  TempSrc:  Oral Oral Oral  SpO2: 91% 96% 94% 90%  Weight:      Height:        Wt Readings from Last 3 Encounters:  04/24/16 89 kg (196 lb 3.4 oz)     Intake/Output Summary (Last 24 hours) at 04/26/16 1052 Last data filed at 04/26/16 [id number]  Gross per 24 hour  Intake                0 ml  Output              400 ml  Net             -400 ml     Physical Exam  Gen: Appears fatigued HEENT: , moist mucosa, supple neck Chest: clear b/l, no added sounds CVS: S1 and S2 tachycardic, no murmurs rub or gallop GI: soft, NT, ND Musculoskeletal: warm, no edema CNS: Alert and oriented, please tear, mild left facial droop, normal tone and strength in all extremities.    Data Review:    CBC  Recent Labs Lab 04/24/16 0007 04/24/16 0027 04/25/16 0426 04/26/16 0439  WBC 8.7  --  11.2* 9.3  HGB 10.1* 10.2* 8.4* 8.2*  HCT 29.3* 30.0* 25.1* 24.7*  PLT 216  --  210 226  MCV 89.3  --  92.3 90.8  MCH 30.8  --  30.9 30.1  MCHC 34.5  --  33.5 33.2  RDW 15.5  --  16.6* 16.7*  LYMPHSABS 1.4  --   --   --   MONOABS 1.0  --   --   --   EOSABS 0.1  --   --   --   BASOSABS 0.0  --   --   --  Chemistries   Recent Labs Lab 04/24/16 0007 04/24/16 0027 04/25/16 0426  NA 130* 130* 139  K 4.0 3.9 3.8  CL 100* 101 111  CO2 19*  --  19*  GLUCOSE 324* 334* 117*  BUN 14 14 5*  CREATININE 1.06* 0.90 0.85  CALCIUM 9.5  --  8.8*  AST 30  --   --   ALT 22  --   --   ALKPHOS 83  --   --   BILITOT 0.6  --   --    ------------------------------------------------------------------------------------------------------------------  Recent Labs  04/25/16 0426  CHOL 196  HDL 38*  LDLCALC 127*  TRIG 154*  CHOLHDL 5.2    Lab Results  Component Value Date   HGBA1C 12.9 (H) 04/25/2016    ------------------------------------------------------------------------------------------------------------------ No results for input(s): TSH, T4TOTAL, T3FREE, THYROIDAB in the last 72 hours.  Invalid input(s): FREET3 ------------------------------------------------------------------------------------------------------------------ No results for input(s): VITAMINB12, FOLATE, FERRITIN, TIBC, IRON, RETICCTPCT in the last 72 hours.  Coagulation profile  Recent Labs Lab 04/24/16 0007  INR 1.21    No results for input(s): DDIMER in the last 72 hours.  Cardiac Enzymes No results for input(s): CKMB, TROPONINI, MYOGLOBIN in the last 168 hours.  Invalid input(s): CK ------------------------------------------------------------------------------------------------------------------ No results found for: BNP  Inpatient Medications  Scheduled Meds: .  stroke: mapping our early stages of recovery book   Does not apply Once  . atorvastatin  20 mg Oral QHS  . enoxaparin (LOVENOX) injection  90 mg Subcutaneous Q12H  . insulin aspart  0-9 Units Subcutaneous Q4H  . polyethylene glycol  17 g Oral Daily   Continuous Infusions: PRN Meds:.acetaminophen **OR** acetaminophen (TYLENOL) oral liquid 160 mg/5 mL **OR** acetaminophen, docusate sodium  Micro Results Recent Results (from the past 240 hour(s))  MRSA PCR Screening     Status: None   Collection Time: 04/24/16  6:31 AM  Result Value Ref Range Status   MRSA by PCR NEGATIVE NEGATIVE Final    Comment:        The GeneXpert MRSA Assay (FDA approved for NASAL specimens only), is one component of a comprehensive MRSA colonization surveillance program. It is not intended to diagnose MRSA infection nor to guide or monitor treatment for MRSA infections.     Radiology Reports Dg Chest 2 View  Result Date: 04/24/2016 CLINICAL DATA:  Cough. History of hypertension and CHF. History of breast cancer. Patient has had loss of function on  the right side over body and has difficulty speaking. EXAM: CHEST  2 VIEW COMPARISON:  None. FINDINGS: Normal heart size and pulmonary vascularity. No focal airspace disease or consolidation in the lungs. No blunting of costophrenic angles. No pneumothorax. Mediastinal contours appear intact. Surgical clips in the left axilla. Degenerative changes in the spine. IMPRESSION: No active cardiopulmonary disease. Electronically Signed   By: Lucienne Capers M.D.   On: 04/24/2016 01:33   Ct Head Wo Contrast  Result Date: 04/24/2016 CLINICAL DATA:  Acute onset of slurred speech and bilateral leg weakness. Initial encounter. EXAM: CT HEAD WITHOUT CONTRAST TECHNIQUE: Contiguous axial images were obtained from the base of the skull through the vertex without intravenous contrast. COMPARISON:  None. FINDINGS: Brain: No evidence of acute infarction, hemorrhage, hydrocephalus, extra-axial collection or mass lesion/mass effect. Prominence of the ventricles and sulci reflects mild cortical volume loss. Scattered periventricular subcortical white matter change likely reflects small vessel ischemic microangiopathy. A small chronic lacunar infarct is noted at the right basal ganglia. The brainstem and fourth ventricle are within  normal limits. The cerebral hemispheres demonstrate grossly normal gray-white differentiation. No mass effect or midline shift is seen. Vascular: No hyperdense vessel or unexpected calcification. Skull: There is no evidence of fracture; visualized osseous structures are unremarkable in appearance. Sinuses/Orbits: The orbits are within normal limits. The paranasal sinuses and mastoid air cells are well-aerated. Other: No significant soft tissue abnormalities are seen. IMPRESSION: 1. No acute intracranial pathology seen on CT. 2. Mild cortical volume loss and scattered small vessel ischemic microangiopathy. 3. Small chronic lacunar infarct at the right basal ganglia. Electronically Signed   By: Garald Balding M.D.   On: 04/24/2016 01:54   Ct Angio Chest Pe W And/or Wo Contrast  Result Date: 04/24/2016 CLINICAL DATA:  Cough, chest pain, and shortness of breath for 2 weeks. History of breast cancer post left mastectomy. Chemotherapy. Multiple recent strokes. Diabetes. EXAM: CT ANGIOGRAPHY CHEST WITH CONTRAST TECHNIQUE: Multidetector CT imaging of the chest was performed using the standard protocol during bolus administration of intravenous contrast. Multiplanar CT image reconstructions and MIPs were obtained to evaluate the vascular anatomy. CONTRAST:  100 mL Isovue 370 COMPARISON:  None. FINDINGS: Cardiovascular: Filling defects demonstrated in the main and bilateral lobar pulmonary artery is extending into bilateral upper and lower lobe branches consistent with saddle embolus with moderately large clot burden. The RV to LV ratio is elevated at 1.19, suggesting risk of right heart strain. No pericardial effusion. Dilated ascending thoracic aorta at 3.5 cm. No dissection. Scattered calcification. Mediastinum/Nodes: Small esophageal hiatal hernia. Esophagus is decompressed. No significant lymphadenopathy in the chest. Lungs/Pleura: Evaluation is limited due to motion artifact. There patchy nodular ground-glass infiltrative changes demonstrated centrally in the lungs, in the lung bases, and scattered throughout the upper lungs. This may be due to multifocal pneumonia, edema, or infarcts. Metastatic disease would be a less likely consideration. No pleural effusions. No pneumothorax. Upper Abdomen: No acute abnormality. Musculoskeletal: Postoperative changes consistent with left mastectomy and surgical clips in the left axilla. No destructive bone lesions. Review of the MIP images confirms the above findings. IMPRESSION: Positive examination for pulmonary embolus, including saddle embolus and bilateral lobar and segmental pulmonary emboli. Moderately large clot burden. Positive for acute PE with CT evidence of right  heart strain (RV/LV Ratio = 1.19) consistent with at least submassive (intermediate risk) PE. The presence of right heart strain has been associated with an increased risk of morbidity and mortality. Please activate Code PE by paging [phone]. Patchy nodular ground-glass infiltrative changes in the lungs may be due to multifocal pneumonia, edema, or infarcts. Metastatic disease would be a less likely consideration but due to history of primary carcinoma, non-contrast chest CT at 3-6 months is recommended. If nodules persist, subsequent management will be based upon the most suspicious nodule(s). This recommendation follows the consensus statement: Guidelines for Management of Incidental Pulmonary Nodules Detected on CT Images: From the Fleischner Society 2017; Radiology 2017; 284:228-243. These results were called by telephone at the time of interpretation on 04/24/2016 at 4:26 am to Dr. Deno Etienne , who verbally acknowledged these results. Electronically Signed   By: Lucienne Capers M.D.   On: 04/24/2016 04:32   Mr Brain Wo Contrast  Result Date: 04/24/2016 CLINICAL DATA:  Right-sided facial droop and difficulty speaking, symptoms for 1 week. History of breast cancer currently on chemotherapy. Large saddle embolus on CT chest. Query paradoxical emboli. History of diabetes. EXAM: MRI HEAD WITHOUT CONTRAST MRA HEAD WITHOUT CONTRAST TECHNIQUE: Multiplanar, multiecho pulse sequences of the brain and surrounding structures  were obtained without intravenous contrast. Angiographic images of the head were obtained using MRA technique without contrast. COMPARISON:  CT chest 04/24/2016.  CT head 04/24/2016. FINDINGS: MRI HEAD FINDINGS Brain: No evidence for acute infarction, hemorrhage, mass lesion, hydrocephalus, or extra-axial fluid. Moderate atrophy. Advanced T2 and FLAIR hyperintensities throughout the white matter, representing either diabetic related chronic microvascular ischemic change or post treatment effect  from chemotherapy. Scattered areas of lacunar infarction in the deep nuclei and subcortical white matter. Brainstem and cerebellum spared. 9 mm of tonsillar descent below the foramen magnum consistent with Chiari I malformation. No visible hydromyelia. Partial empty sella. Vascular: Normal flow voids. Skull and upper cervical spine: Normal marrow signal. Large disc extrusion at C3-4, evaluated only on sagittal images, causing significant cord compression. Consider cervical spine MRI for further evaluation. Sinuses/Orbits: Negative. Other: None. MRA HEAD FINDINGS The internal carotid arteries are widely patent. Basilar artery is widely patent with vertebrals codominant. There is no intracranial stenosis or visible aneurysm. IMPRESSION: Moderate atrophy with advanced nonacute white matter signal abnormality. No visible acute stroke. Chiari I malformation. 9 mm tonsillar descent without visible hydromyelia. Within limits for assessment on noncontrast brain, no intracranial metastatic disease or visible osseous disease. No intracranial stenosis or occlusion. Electronically Signed   By: Staci Righter M.D.   On: 04/24/2016 13:34   Mr Jodene Nam Head/brain [id number] Cm  Result Date: 04/24/2016 CLINICAL DATA:  Right-sided facial droop and difficulty speaking, symptoms for 1 week. History of breast cancer currently on chemotherapy. Large saddle embolus on CT chest. Query paradoxical emboli. History of diabetes. EXAM: MRI HEAD WITHOUT CONTRAST MRA HEAD WITHOUT CONTRAST TECHNIQUE: Multiplanar, multiecho pulse sequences of the brain and surrounding structures were obtained without intravenous contrast. Angiographic images of the head were obtained using MRA technique without contrast. COMPARISON:  CT chest 04/24/2016.  CT head 04/24/2016. FINDINGS: MRI HEAD FINDINGS Brain: No evidence for acute infarction, hemorrhage, mass lesion, hydrocephalus, or extra-axial fluid. Moderate atrophy. Advanced T2 and FLAIR hyperintensities throughout the  white matter, representing either diabetic related chronic microvascular ischemic change or post treatment effect from chemotherapy. Scattered areas of lacunar infarction in the deep nuclei and subcortical white matter. Brainstem and cerebellum spared. 9 mm of tonsillar descent below the foramen magnum consistent with Chiari I malformation. No visible hydromyelia. Partial empty sella. Vascular: Normal flow voids. Skull and upper cervical spine: Normal marrow signal. Large disc extrusion at C3-4, evaluated only on sagittal images, causing significant cord compression. Consider cervical spine MRI for further evaluation. Sinuses/Orbits: Negative. Other: None. MRA HEAD FINDINGS The internal carotid arteries are widely patent. Basilar artery is widely patent with vertebrals codominant. There is no intracranial stenosis or visible aneurysm. IMPRESSION: Moderate atrophy with advanced nonacute white matter signal abnormality. No visible acute stroke. Chiari I malformation. 9 mm tonsillar descent without visible hydromyelia. Within limits for assessment on noncontrast brain, no intracranial metastatic disease or visible osseous disease. No intracranial stenosis or occlusion. Electronically Signed   By: Staci Righter M.D.   On: 04/24/2016 13:34    Time Spent in minutes  35   Louellen Molder M.D on 04/26/2016 at 10:52 AM  Between 7am to 7pm - Pager - [phone]  After 7pm go to [email or web address] - password [facility]  [facility] -  Office  [phone]

## 2016-04-26 NOTE — Evaluation (Addendum)
Occupational Therapy Evaluation Patient Details Name: Carrie Watts MRN: [id number] DOB: 12/13/1950 Today's Date: 04/26/2016    History of Present Illness 66 y/o female came to the [facility] ED on 1/14 with cough, dyspnea and chest pain in the setting of two weeks of R sided facial droop and slurred speech after returning from [place] on 1/13. Noted to have a large PE on CT angiogram. MRI and MRA negative.   Clinical Impression   Pt presents with B UE weakness and decreased fine motor coordination, decreased activity tolerance for ADL, poor problem solving abilities, emergent awareness, and decreased awareness of safety. Pt currently requires mod assist for toilet transfer, min assist for UB ADL, and mod assist for LB ADL. Difficult to determine PLOF and home information as unsure of reliability of pt report; however, she does report independence with RW PTA. Pt would benefit from continued OT services while admitted to improve independence with and activity tolerance for ADL and functional mobility. Pt is a good candidate for short-term SNF placement for continued rehabilitation services prior to returning home in order to maximize independence and safety with ADL.     Follow Up Recommendations  SNF;Supervision/Assistance - 24 hour    Equipment Recommendations  Other (comment) (TBD)    Recommendations for Other Services       Precautions / Restrictions Precautions Precautions: Fall Precaution Comments: SOB with activity Restrictions Weight Bearing Restrictions: No      Mobility Bed Mobility Overal bed mobility: Needs Assistance Bed Mobility: Rolling;Sidelying to Sit Rolling: Min assist Sidelying to sit: Min assist       General bed mobility comments: Min assist to raise trunk and scoot to EOB.  Transfers Overall transfer level: Needs assistance Equipment used: Rolling walker (2 wheeled) Transfers: Sit to/from Stand Sit to Stand: Mod assist         General transfer  comment: VC's for safe hand placement.    Balance Overall balance assessment: Needs assistance Sitting-balance support: Feet supported;Single extremity supported Sitting balance-Leahy Scale: Fair     Standing balance support: Bilateral upper extremity supported;During functional activity Standing balance-Leahy Scale: Poor Standing balance comment: needs UE support for safe standing                            ADL Overall ADL's : Needs assistance/impaired Eating/Feeding: Supervision/ safety;Sitting   Grooming: Set up;Supervision/safety;Sitting   Upper Body Bathing: Minimal assistance;Sitting   Lower Body Bathing: Maximal assistance;Sit to/from stand   Upper Body Dressing : Minimal assistance;Sitting   Lower Body Dressing: Maximal assistance;Sit to/from stand   Toilet Transfer: Moderate assistance;Ambulation;RW   Toileting- Clothing Manipulation and Hygiene: Sit to/from stand;Maximal assistance       Functional mobility during ADLs: Moderate assistance;Rolling walker General ADL Comments: Pt becoming short of breath with short distance ambulation on RA with O2 saturation reading 91%.     Vision Vision Assessment?: Vision impaired- to be further tested in functional context   Perception     Praxis      Pertinent Vitals/Pain Pain Assessment: No/denies pain     Hand Dominance Right   Extremity/Trunk Assessment Upper Extremity Assessment Upper Extremity Assessment: Generalized weakness;RUE deficits/detail;LUE deficits/detail RUE Coordination: decreased fine motor;decreased gross motor LUE Coordination: decreased fine motor;decreased gross motor   Lower Extremity Assessment Lower Extremity Assessment: Generalized weakness       Communication Communication Communication: Prefers language other than English   Cognition Arousal/Alertness: Awake/alert Behavior During Therapy: Anxious Overall Cognitive Status:  Impaired/Different from baseline Area of  Impairment: Safety/judgement;Awareness;Problem solving         Safety/Judgement: Decreased awareness of safety Awareness: Emergent Problem Solving: Slow processing;Difficulty sequencing General Comments: Pt does prefer different language as well as demonstrating expressive difficulty. However, unsure of accuracy of pt report due to inconsistencies.   General Comments       Exercises       Shoulder Instructions      Home Living Family/patient expects to be discharged to:: Unsure Living Arrangements: Spouse/significant other (Can also stay with daughter) Available Help at Discharge: Family;Available PRN/intermittently Type of Home: House                           Additional Comments: Difficult to determine home environment information from pt report. She does live in [place] about half of the time and from PT report, pt receives her chemo and had her L mastectomy in [place]. She reports that she can stay with her daughter and was independent with RW prior to this event.   Lives With:  (Visiting with daughter and had just returned to [place] )    Prior Functioning/Environment Level of Independence: Independent        Comments: Pt reports that she has been using RW recently. She reports that she is able to complete ADL without assist. No family available for assistance with PLOF information.        OT Problem List: Decreased strength;Decreased activity tolerance;Impaired balance (sitting and/or standing);Decreased safety awareness;Decreased knowledge of use of DME or AE;Decreased knowledge of precautions;Pain   OT Treatment/Interventions: Self-care/ADL training;Therapeutic exercise;Therapeutic activities;Patient/family education;Balance training;Visual/perceptual remediation/compensation;Cognitive remediation/compensation;DME and/or AE instruction    OT Goals(Current goals can be found in the care plan section) Acute Rehab OT Goals Patient Stated Goal: be able to  walk OT Goal Formulation: With patient Time For Goal Achievement: 05/03/16 Potential to Achieve Goals: Good ADL Goals Pt Will Perform Grooming: with supervision;standing Pt Will Perform Upper Body Dressing: with modified independence;sitting Pt Will Perform Lower Body Dressing: with supervision;sit to/from stand Pt Will Transfer to Toilet: with supervision;bedside commode;ambulating Pt Will Perform Toileting - Clothing Manipulation and hygiene: with supervision;sit to/from stand Pt Will Perform Tub/Shower Transfer: Tub transfer;Shower transfer;with supervision;3 in Event organiser will Perform Home Exercise Program: Both right and left upper extremity;With written HEP provided (Increased strength and fine motor coordination) Additional ADL Goal #1: Pt will identify and incorporate 3 strategies to conserve energy during ADL in preparation for improved ADL independence.  OT Frequency: Min 2X/week   Barriers to D/C:            Co-evaluation              End of Session Equipment Utilized During Treatment: Gait belt;Rolling walker Nurse Communication: Mobility status  Activity Tolerance: Patient tolerated treatment well Patient left: in chair;with call bell/phone within reach;with chair alarm set   Time: 1255-1331 OT Time Calculation (min): 36 min Charges:  OT General Charges $OT Visit: 1 Procedure OT Evaluation $OT Eval Moderate Complexity: 1 Procedure OT Treatments $Self Care/Home Management : 8-22 mins  Norman Herrlich, OTR/L [phone] 04/26/2016, 4:30 PM

## 2016-04-27 DIAGNOSIS — I509 Heart failure, unspecified: Secondary | ICD-10-CM | POA: Diagnosis not present

## 2016-04-27 DIAGNOSIS — I2692 Saddle embolus of pulmonary artery without acute cor pulmonale: Secondary | ICD-10-CM | POA: Diagnosis not present

## 2016-04-27 DIAGNOSIS — E1122 Type 2 diabetes mellitus with diabetic chronic kidney disease: Secondary | ICD-10-CM | POA: Diagnosis not present

## 2016-04-27 DIAGNOSIS — I82401 Acute embolism and thrombosis of unspecified deep veins of right lower extremity: Secondary | ICD-10-CM | POA: Diagnosis not present

## 2016-04-27 DIAGNOSIS — I130 Hypertensive heart and chronic kidney disease with heart failure and stage 1 through stage 4 chronic kidney disease, or unspecified chronic kidney disease: Secondary | ICD-10-CM | POA: Diagnosis not present

## 2016-04-27 DIAGNOSIS — I639 Cerebral infarction, unspecified: Secondary | ICD-10-CM | POA: Diagnosis not present

## 2016-04-27 DIAGNOSIS — I2602 Saddle embolus of pulmonary artery with acute cor pulmonale: Secondary | ICD-10-CM | POA: Diagnosis not present

## 2016-04-27 DIAGNOSIS — E871 Hypo-osmolality and hyponatremia: Secondary | ICD-10-CM | POA: Diagnosis not present

## 2016-04-27 LAB — CBC
HCT: 25.2 % — ABNORMAL LOW (ref 36.0–46.0)
HEMOGLOBIN: 8.3 g/dL — AB (ref 12.0–15.0)
MCH: 30.3 pg (ref 26.0–34.0)
MCHC: 32.9 g/dL (ref 30.0–36.0)
MCV: 92 fL (ref 78.0–100.0)
Platelets: 228 10*3/uL (ref 150–400)
RBC: 2.74 MIL/uL — ABNORMAL LOW (ref 3.87–5.11)
RDW: 16.9 % — AB (ref 11.5–15.5)
WBC: 8.2 10*3/uL (ref 4.0–10.5)

## 2016-04-27 LAB — GLUCOSE, CAPILLARY
GLUCOSE-CAPILLARY: 119 mg/dL — AB (ref 65–99)
GLUCOSE-CAPILLARY: 193 mg/dL — AB (ref 65–99)
GLUCOSE-CAPILLARY: 289 mg/dL — AB (ref 65–99)
GLUCOSE-CAPILLARY: 230 mg/dL — AB (ref 65–99)
Glucose-Capillary: 136 mg/dL — ABNORMAL HIGH (ref 65–99)
Glucose-Capillary: 155 mg/dL — ABNORMAL HIGH (ref 65–99)

## 2016-04-27 MED ORDER — ENOXAPARIN SODIUM 80 MG/0.8ML ~~LOC~~ SOLN
80.0000 mg | Freq: Two times a day (BID) | SUBCUTANEOUS | Status: DC
  Administered 2016-04-27 – 2016-05-03 (×12): 80 mg via SUBCUTANEOUS
  Filled 2016-04-27 (×12): qty 0.8

## 2016-04-27 MED ORDER — GUAIFENESIN-DM 100-10 MG/5ML PO SYRP
5.0000 mL | ORAL_SOLUTION | ORAL | Status: DC | PRN
  Administered 2016-04-27 – 2016-04-28 (×4): 5 mL via ORAL
  Filled 2016-04-27 (×4): qty 5

## 2016-04-27 MED ORDER — ONDANSETRON HCL 4 MG/2ML IJ SOLN
4.0000 mg | Freq: Four times a day (QID) | INTRAMUSCULAR | Status: DC | PRN
  Administered 2016-04-27 – 2016-05-03 (×3): 4 mg via INTRAVENOUS
  Filled 2016-04-27 (×3): qty 2

## 2016-04-27 NOTE — Progress Notes (Addendum)
Speech Language Pathology Treatment: Dysphagia;Cognitive-Linquistic  Patient Details Name: Carrie Watts MRN: [id number] DOB: [birth year] Today's Date: 04/27/2016 Time: [id number] SLP Time Calculation (min) (ACUTE ONLY): 21 min  Assessment / Plan / Recommendation Clinical Impression  Pt was seen for treatment regarding cognition/language and dysphagia. Pt had deficits previously regarding expressive language and dysfluency, but appears to have resolved per this SLP and pt in agreement. Cognitive language goals have been met. Pt reports difficulty with short term memory and explained that her thoughts "do not stay" in her memory. SLP advised and educated pt re: memory strategies and encouraged her to utilize notes. No overt signs/symptoms of aspiration were noted at bedside, however pt experienced difficulty coordinating the appropriate respiration post swallow. Pt intermittently inhaled after the swallow, prompting SLP to educate pt to exhale after swallowing food/liquids. Pt reports nausea and pharyngeal globus sensation likely due to results of chemotherapy; pt taking reflux medication. SLP questioning anxiety re: PO consumption. Continue with current diet of Dys 3 and thin liquids. SLP will continue monitoring and will f/u as needed.   HPI HPI: 66 y/o female came to the [facility] ED on 1/14 with cough, dyspnea and chest pain in the setting of two weeks of R sided facial droop and slurred speech after returning from [place] on 1/13.  Noted to have a large PE on CT angiogram so PCCM was consulted for further evaluation.  She is being evaluated by neurology for a possible stroke.  She has received lovenox for the PE.  Neurology has ordered an MRI of the brain      SLP Plan  Continue with current plan of care     Recommendations  Diet recommendations: Dysphagia 3 (mechanical soft);Thin liquid Liquids provided via: Cup;No straw Medication Administration: Whole meds with puree Supervision: Full  supervision/cueing for compensatory strategies Compensations: Slow rate;Small sips/bites;Other (Comment) (exhale after swallowing) Postural Changes and/or Swallow Maneuvers: Seated upright 90 degrees                Oral Care Recommendations: Oral care BID Plan: Continue with current plan of care       [place], Student SLP 04/27/2016, 10:24 AM

## 2016-04-27 NOTE — Progress Notes (Signed)
Physical Therapy Treatment Patient Details Name: Carrie Watts MRN: [id number] DOB: 1950-07-25 Today's Date: 04/27/2016    History of Present Illness 66 y/o female came to the [facility] ED on 1/14 with cough, dyspnea and chest pain in the setting of two weeks of R sided facial droop and slurred speech after returning from [place] on 1/13. Noted to have a large PE on CT angiogram. MRI and MRA negative.    PT Comments    Pt performed increased mobility during session.  Pt initially on RA desaturating to 86%.  Pt required 3L to improve and maintain O2 sats above 90%.  Pt remains to require min to mod assist.  Pt will continue to benefit from skilled nursing placement to improve strength and functional mobility before returning home.     Follow Up Recommendations  SNF;Supervision/Assistance - 24 hour     Equipment Recommendations  None recommended by PT    Recommendations for Other Services       Precautions / Restrictions Precautions Precautions: Fall Precaution Comments: SOB with activity Restrictions Weight Bearing Restrictions: No    Mobility  Bed Mobility Overal bed mobility: Needs Assistance Bed Mobility: Supine to Sit Rolling: Supervision (with increased. )         General bed mobility comments: Pt performed functional mobility with decreased.  Required increased time to elevate trunk into sitting.  Pt on RA during activity with increased respirations and desaturating to 86%.  Applied 3L to improve O2 sats greater than 90%.    Transfers Overall transfer level: Needs assistance Equipment used: Rolling walker (2 wheeled) Transfers: Sit to/from Stand Sit to Stand: Mod assist         General transfer comment: Cues for hand placement to push from seated surface, required assist to boost into standing.    Ambulation/Gait Ambulation/Gait assistance: Min assist Ambulation Distance (Feet): 25 Feet Assistive device: Rolling walker (2 wheeled) Gait Pattern/deviations:  Step-through pattern;Decreased stride length;Narrow base of support;Trunk flexed Gait velocity: slow Gait velocity interpretation: Below normal speed for age/gender General Gait Details: Cues for upper trunk control.  Pt required assist to advance B LEs.  Pt unsteady with RW and demonstrated LOB anterior when attempting to negotiate around obstacles in room.     Stairs            Wheelchair Mobility    Modified Rankin (Stroke Patients Only)       Balance     Sitting balance-Leahy Scale: Fair       Standing balance-Leahy Scale: Poor                      Cognition Arousal/Alertness: Awake/alert Behavior During Therapy: Anxious Overall Cognitive Status: Within Functional Limits for tasks assessed                 General Comments: Language barrier but does speak english.      Exercises      General Comments        Pertinent Vitals/Pain Pain Assessment: No/denies pain    Home Living                      Prior Function            PT Goals (current goals can now be found in the care plan section) Acute Rehab PT Goals Patient Stated Goal: be able to walk Potential to Achieve Goals: Good Progress towards PT goals: Progressing toward goals    Frequency  Min 3X/week      PT Plan Current plan remains appropriate    Co-evaluation             End of Session Equipment Utilized During Treatment: Gait belt Activity Tolerance: Patient tolerated treatment well Patient left: in chair;with call bell/phone within reach;with chair alarm set     Time: [phone] PT Time Calculation (min) (ACUTE ONLY): 30 min  Charges:  $Gait Training: 8-22 mins $Therapeutic Activity: 8-22 mins                    G Codes:      Cristela Blue 05/01/2016, 9:19 AM [place], PTA pager [phone]

## 2016-04-27 NOTE — Progress Notes (Addendum)
ANTICOAGULATION CONSULT NOTE - follow up  Pharmacy Consult for Lovenox Indication: pulmonary embolus /DVT RLE  Allergies  Allergen Reactions  . Dust Mite Extract     Patient Measurements: Height: 5\' 3"  (160 cm) Weight: 180 lb 5.4 oz (81.8 kg) IBW/kg (Calculated) : 52.4   Addendum: re-weighed 04/27/16 = 79.1 kg    Vital Signs: Temp: 99.1 F (37.3 C) (01/17 1423) Temp Source: Oral (01/17 1423) BP: 125/82 (01/17 1423) Pulse Rate: 99 (01/17 1423)  Labs:  Recent Labs  04/25/16 0426 04/26/16 0439 04/27/16 0535  HGB 8.4* 8.2* 8.3*  HCT 25.1* 24.7* 25.2*  PLT 210 226 228  CREATININE 0.85  --   --     Estimated Creatinine Clearance: 65.1 mL/min (by C-G formula based on SCr of 0.85 mg/dL).   Medical History: Past Medical History:  Diagnosis Date  . Cancer (HCC)    breast  . CHF (congestive heart failure) ([place])   . Diabetes mellitus without complication ([place])   . Renal disorder     Assessment: 66yo female with history of left breast cancer s/p lumpectomy (1 yr ago), on chemotherapy in [place] c/o focal weakness and speech change as well as SOB and CP 2/2 cough, CT of head is negative, CT chest shows acute  saddle PE.. MD notes PE likely due to hypercoagulable state from underlying breast cancer and recent travel. Lovenox full dose began on 04/24/16.  Continue Lovenox treatment dose for PE/DVT RLE Lower extremity Doppler shows acute DVT involving right saphenofemoral junction, femoral vein, popliteal vein, posterior tibial and peroneal veins. No DVT on left leg. The plan is to continue Lovenox treatment dose. Outpatient prescription approved to be filled under [facility] program for 1 month supply then daughter states she will be able to get Lovenox in [place] when patient returns.  Today's RN reported that night RN stated patient coughed up small amount of blood last night. No further blood or bleeding noted today per RN.  Weight 196 lb on 1/14,  Today's weight recorded as 180 lb.  RN to re-weigh patient.  Goal of Therapy:  Anti-Xa level 0.6-1 units/ml 4hrs after LMWH dose given Monitor platelets by anticoagulation protocol: Yes   Plan:  Continues on Lovenox 90mg  SQ Q12H (see Addendum note for dose decrease) Monitor inpatient CBC q72h, serum creatinine weekly while on full dose lovenox.  F/u weight- RN to re-weigh    Thank you for allowing pharmacy to be part of this patients care team. Nicole Cella, [place] Clinical Pharmacist Pager: [phone] After 4p [phone] 04/27/2016,2:45 PM    Addendum: re-weighed 04/27/16 = 79.1 kg SCr 0.85 on 04/25/16,  CrCl ~ 64 ml/min  Hgb 8.3 low stable, PLTC 228 stable/wnl  Plan: Decrease Lovenox dose to 80 mg SQ q12h (1mg /kg q12h)  Nicole Cella, RPh Clinical Pharmacist Pager: [phone] 04/27/2016 3:46 PM

## 2016-04-27 NOTE — Progress Notes (Signed)
Patient Demographics:    Carrie Watts, is a 66 y.o. female, DOB - 1950-10-15, [id number]  Admit date - 04/24/2016   Admitting Physician Etta Quill, DO  Outpatient Primary MD for the patient is No primary care provider on file.  LOS - 3   Chief Complaint  Patient presents with  . difficulty speaking        Subjective:    Carrie Watts today has no fevers, no emesis,  No chest pain,  C/o sob   Assessment  & Plan :    Principal Problem:   Saddle pulmonary embolus (HCC) Active Problems:   DM2 (diabetes mellitus, type 2) (HCC)   Stroke-like symptoms   Acute saddle pulmonary embolism with acute cor pulmonale (HCC)   History of cancer of left breast   Pressure injury of skin   Acute deep vein thrombosis (DVT) of right lower extremity (HCC)   Hyperlipidemia  Brief Narrative   66 year old female with history of diabetes mellitus, left breast cancer status post lumpectomy who recently flew back from [place] presented with right-sided facial droop with difficulty speaking, chest discomfort, cough and shortness of breath. These symptoms were going on for few days. Workup done in the ED showed a saddle pulmonary emboli. Admitted to hospitalist service on anticoagulation. PC CM consulted. Neurology consulted for strokelike symptoms.  Pt was seen at vascular lab. Her TCD Bubble study negative for PFO. However, She was found to have right LE DVT. She still on Lovenox therapeutic dose   Carrie Watts is a 66 y.o. female with history of breast CA, CHF, CKD, and DM  presenting with one week history of right sided weakness and speech difficulties.. She did not receive IV t-PA due to late presentation.  1)PE with saddle embolus and right heart strain/Right LE DVT - ? related to long flight travel vs. Hypercoagulable state secondary to breast cancer-  continue therapeutic Lovenox, TCD bubble study no  PFO  2)Stuttering speech - resolved speech concerns,  MRI and MRA of the brain without acute stroke findings, patient has old basal ganglia infarct, echo with EF of 55-60% without PFO on bubble study, LDL is 127, carotid Dopplers without acute findings, continue aspirin and Lipitor   3)Chiari malformation- 9 mm tonsillar descent without visible hydromyelia, asymptomatic, outpatient neurosurgical consult advised  4)Hypertension- stable at this time  5)Diabetes- A1c is over 12, start Lantus insulin 10 units daily at bedtime and Use Novolog/Humalog Sliding scale insulin with Accu-Cheks/Fingersticks as ordered   6)Breast cancer - s/p mastectomy and undergoing chemotherapy, To rule out metastatic disease on chest CT - F/U non contrast chest CT in 3 - 6 months.   Code Status : Full    Disposition Plan  : to be determined   Consults  :   Neurology signed off   DVT Prophylaxis  :  Lovenox   Lab Results  Component Value Date   PLT 228 04/27/2016    Inpatient Medications  Scheduled Meds: .  stroke: mapping our early stages of recovery book   Does not apply Once  . atorvastatin  20 mg Oral QHS  . enoxaparin (LOVENOX) injection  90 mg Subcutaneous Q12H  . insulin aspart  0-9 Units Subcutaneous Q4H  . polyethylene glycol  17 g Oral Daily   Continuous Infusions: PRN Meds:.acetaminophen **OR** acetaminophen (TYLENOL) oral liquid 160 mg/5 mL **OR** acetaminophen, alum & mag hydroxide-simeth, docusate sodium, guaiFENesin-dextromethorphan, ondansetron (ZOFRAN) IV   Anti-infectives    Start     Dose/Rate Route Frequency Ordered Stop   04/24/16 0445  vancomycin (VANCOCIN) 1,500 mg in sodium chloride 0.9 % 500 mL IVPB     1,500 mg 250 mL/hr over 120 Minutes Intravenous  Once 04/24/16 0440 04/24/16 0659   04/24/16 0445  piperacillin-tazobactam (ZOSYN) IVPB 3.375 g     3.375 g 100 mL/hr over 30 Minutes Intravenous  Once 04/24/16 0440 04/24/16 0526        Objective:   Vitals:    04/26/16 2023 04/27/16 0205 04/27/16 0520 04/27/16 0553  BP: 137/77 (!) 108/56 126/74 130/74  Pulse: 96 (!) 107 (!) 106 (!) 106  Resp: 18 18 18    Temp: 98.4 F (36.9 C) 98.4 F (36.9 C) 99.6 F (37.6 C)   TempSrc:      SpO2: 94% 92% 94% 92%  Weight:    81.8 kg (180 lb 5.4 oz)  Height:        Wt Readings from Last 3 Encounters:  04/27/16 81.8 kg (180 lb 5.4 oz)     Intake/Output Summary (Last 24 hours) at 04/27/16 0923 Last data filed at 04/26/16 2341  Gross per 24 hour  Intake                0 ml  Output              350 ml  Net             -350 ml     Physical Exam  Gen:- Awake Alert,  In no apparent distress  HEENT:- [place].AT, No sclera icterus Neck-Supple Neck,No JVD,.  Lungs-  CTAB  CV- S1, S2 normal Abd-  +ve B.Sounds, Abd Soft, No tenderness,    Extremity/Skin:- Intact pulses    Data Review:   Micro Results Recent Results (from the past 240 hour(s))  MRSA PCR Screening     Status: None   Collection Time: 04/24/16  6:31 AM  Result Value Ref Range Status   MRSA by PCR NEGATIVE NEGATIVE Final    Comment:        The GeneXpert MRSA Assay (FDA approved for NASAL specimens only), is one component of a comprehensive MRSA colonization surveillance program. It is not intended to diagnose MRSA infection nor to guide or monitor treatment for MRSA infections.     Radiology Reports Dg Chest 2 View  Result Date: 04/24/2016 CLINICAL DATA:  Cough. History of hypertension and CHF. History of breast cancer. Patient has had loss of function on the right side over body and has difficulty speaking. EXAM: CHEST  2 VIEW COMPARISON:  None. FINDINGS: Normal heart size and pulmonary vascularity. No focal airspace disease or consolidation in the lungs. No blunting of costophrenic Carrie. No pneumothorax. Mediastinal contours appear intact. Surgical clips in the left axilla. Degenerative changes in the spine. IMPRESSION: No active cardiopulmonary disease. Electronically Signed    By: Lucienne Capers M.D.   On: 04/24/2016 01:33   Ct Head Wo Contrast  Result Date: 04/24/2016 CLINICAL DATA:  Acute onset of slurred speech and bilateral leg weakness. Initial encounter. EXAM: CT HEAD WITHOUT CONTRAST TECHNIQUE: Contiguous axial images were obtained from the base of the skull through the vertex without intravenous contrast. COMPARISON:  None. FINDINGS: Brain: No evidence of acute infarction, hemorrhage, hydrocephalus,  extra-axial collection or mass lesion/mass effect. Prominence of the ventricles and sulci reflects mild cortical volume loss. Scattered periventricular subcortical white matter change likely reflects small vessel ischemic microangiopathy. A small chronic lacunar infarct is noted at the right basal ganglia. The brainstem and fourth ventricle are within normal limits. The cerebral hemispheres demonstrate grossly normal gray-white differentiation. No mass effect or midline shift is seen. Vascular: No hyperdense vessel or unexpected calcification. Skull: There is no evidence of fracture; visualized osseous structures are unremarkable in appearance. Sinuses/Orbits: The orbits are within normal limits. The paranasal sinuses and mastoid air cells are well-aerated. Other: No significant soft tissue abnormalities are seen. IMPRESSION: 1. No acute intracranial pathology seen on CT. 2. Mild cortical volume loss and scattered small vessel ischemic microangiopathy. 3. Small chronic lacunar infarct at the right basal ganglia. Electronically Signed   By: Garald Balding M.D.   On: 04/24/2016 01:54   Ct Angio Chest Pe W And/or Wo Contrast  Result Date: 04/24/2016 CLINICAL DATA:  Cough, chest pain, and shortness of breath for 2 weeks. History of breast cancer post left mastectomy. Chemotherapy. Multiple recent strokes. Diabetes. EXAM: CT ANGIOGRAPHY CHEST WITH CONTRAST TECHNIQUE: Multidetector CT imaging of the chest was performed using the standard protocol during bolus administration of  intravenous contrast. Multiplanar CT image reconstructions and MIPs were obtained to evaluate the vascular anatomy. CONTRAST:  100 mL Isovue 370 COMPARISON:  None. FINDINGS: Cardiovascular: Filling defects demonstrated in the main and bilateral lobar pulmonary artery is extending into bilateral upper and lower lobe branches consistent with saddle embolus with moderately large clot burden. The RV to LV ratio is elevated at 1.19, suggesting risk of right heart strain. No pericardial effusion. Dilated ascending thoracic aorta at 3.5 cm. No dissection. Scattered calcification. Mediastinum/Nodes: Small esophageal hiatal hernia. Esophagus is decompressed. No significant lymphadenopathy in the chest. Lungs/Pleura: Evaluation is limited due to motion artifact. There patchy nodular ground-glass infiltrative changes demonstrated centrally in the lungs, in the lung bases, and scattered throughout the upper lungs. This may be due to multifocal pneumonia, edema, or infarcts. Metastatic disease would be a less likely consideration. No pleural effusions. No pneumothorax. Upper Abdomen: No acute abnormality. Musculoskeletal: Postoperative changes consistent with left mastectomy and surgical clips in the left axilla. No destructive bone lesions. Review of the MIP images confirms the above findings. IMPRESSION: Positive examination for pulmonary embolus, including saddle embolus and bilateral lobar and segmental pulmonary emboli. Moderately large clot burden. Positive for acute PE with CT evidence of right heart strain (RV/LV Ratio = 1.19) consistent with at least submassive (intermediate risk) PE. The presence of right heart strain has been associated with an increased risk of morbidity and mortality. Please activate Code PE by paging [phone]. Patchy nodular ground-glass infiltrative changes in the lungs may be due to multifocal pneumonia, edema, or infarcts. Metastatic disease would be a less likely consideration but due to  history of primary carcinoma, non-contrast chest CT at 3-6 months is recommended. If nodules persist, subsequent management will be based upon the most suspicious nodule(s). This recommendation follows the consensus statement: Guidelines for Management of Incidental Pulmonary Nodules Detected on CT Images: From the Fleischner Society 2017; Radiology 2017; 284:228-243. These results were called by telephone at the time of interpretation on 04/24/2016 at 4:26 am to Dr. Deno Etienne , who verbally acknowledged these results. Electronically Signed   By: Lucienne Capers M.D.   On: 04/24/2016 04:32   Mr Brain Wo Contrast  Result Date: 04/24/2016 CLINICAL DATA:  Right-sided  facial droop and difficulty speaking, symptoms for 1 week. History of breast cancer currently on chemotherapy. Large saddle embolus on CT chest. Query paradoxical emboli. History of diabetes. EXAM: MRI HEAD WITHOUT CONTRAST MRA HEAD WITHOUT CONTRAST TECHNIQUE: Multiplanar, multiecho pulse sequences of the brain and surrounding structures were obtained without intravenous contrast. Angiographic images of the head were obtained using MRA technique without contrast. COMPARISON:  CT chest 04/24/2016.  CT head 04/24/2016. FINDINGS: MRI HEAD FINDINGS Brain: No evidence for acute infarction, hemorrhage, mass lesion, hydrocephalus, or extra-axial fluid. Moderate atrophy. Advanced T2 and FLAIR hyperintensities throughout the white matter, representing either diabetic related chronic microvascular ischemic change or post treatment effect from chemotherapy. Scattered areas of lacunar infarction in the deep nuclei and subcortical white matter. Brainstem and cerebellum spared. 9 mm of tonsillar descent below the foramen magnum consistent with Chiari I malformation. No visible hydromyelia. Partial empty sella. Vascular: Normal flow voids. Skull and upper cervical spine: Normal marrow signal. Large disc extrusion at C3-4, evaluated only on sagittal images, causing  significant cord compression. Consider cervical spine MRI for further evaluation. Sinuses/Orbits: Negative. Other: None. MRA HEAD FINDINGS The internal carotid arteries are widely patent. Basilar artery is widely patent with vertebrals codominant. There is no intracranial stenosis or visible aneurysm. IMPRESSION: Moderate atrophy with advanced nonacute white matter signal abnormality. No visible acute stroke. Chiari I malformation. 9 mm tonsillar descent without visible hydromyelia. Within limits for assessment on noncontrast brain, no intracranial metastatic disease or visible osseous disease. No intracranial stenosis or occlusion. Electronically Signed   By: Staci Righter M.D.   On: 04/24/2016 13:34   Mr Jodene Nam Head/brain [id number] Cm  Result Date: 04/24/2016 CLINICAL DATA:  Right-sided facial droop and difficulty speaking, symptoms for 1 week. History of breast cancer currently on chemotherapy. Large saddle embolus on CT chest. Query paradoxical emboli. History of diabetes. EXAM: MRI HEAD WITHOUT CONTRAST MRA HEAD WITHOUT CONTRAST TECHNIQUE: Multiplanar, multiecho pulse sequences of the brain and surrounding structures were obtained without intravenous contrast. Angiographic images of the head were obtained using MRA technique without contrast. COMPARISON:  CT chest 04/24/2016.  CT head 04/24/2016. FINDINGS: MRI HEAD FINDINGS Brain: No evidence for acute infarction, hemorrhage, mass lesion, hydrocephalus, or extra-axial fluid. Moderate atrophy. Advanced T2 and FLAIR hyperintensities throughout the white matter, representing either diabetic related chronic microvascular ischemic change or post treatment effect from chemotherapy. Scattered areas of lacunar infarction in the deep nuclei and subcortical white matter. Brainstem and cerebellum spared. 9 mm of tonsillar descent below the foramen magnum consistent with Chiari I malformation. No visible hydromyelia. Partial empty sella. Vascular: Normal flow voids. Skull and  upper cervical spine: Normal marrow signal. Large disc extrusion at C3-4, evaluated only on sagittal images, causing significant cord compression. Consider cervical spine MRI for further evaluation. Sinuses/Orbits: Negative. Other: None. MRA HEAD FINDINGS The internal carotid arteries are widely patent. Basilar artery is widely patent with vertebrals codominant. There is no intracranial stenosis or visible aneurysm. IMPRESSION: Moderate atrophy with advanced nonacute white matter signal abnormality. No visible acute stroke. Chiari I malformation. 9 mm tonsillar descent without visible hydromyelia. Within limits for assessment on noncontrast brain, no intracranial metastatic disease or visible osseous disease. No intracranial stenosis or occlusion. Electronically Signed   By: Staci Righter M.D.   On: 04/24/2016 13:34     CBC  Recent Labs Lab 04/24/16 0007 04/24/16 0027 04/25/16 0426 04/26/16 0439 04/27/16 0535  WBC 8.7  --  11.2* 9.3 8.2  HGB 10.1* 10.2* 8.4* 8.2* 8.3*  HCT 29.3* 30.0* 25.1* 24.7* 25.2*  PLT 216  --  210 226 228  MCV 89.3  --  92.3 90.8 92.0  MCH 30.8  --  30.9 30.1 30.3  MCHC 34.5  --  33.5 33.2 32.9  RDW 15.5  --  16.6* 16.7* 16.9*  LYMPHSABS 1.4  --   --   --   --   MONOABS 1.0  --   --   --   --   EOSABS 0.1  --   --   --   --   BASOSABS 0.0  --   --   --   --     Chemistries   Recent Labs Lab 04/24/16 0007 04/24/16 0027 04/25/16 0426  NA 130* 130* 139  K 4.0 3.9 3.8  CL 100* 101 111  CO2 19*  --  19*  GLUCOSE 324* 334* 117*  BUN 14 14 5*  CREATININE 1.06* 0.90 0.85  CALCIUM 9.5  --  8.8*  AST 30  --   --   ALT 22  --   --   ALKPHOS 83  --   --   BILITOT 0.6  --   --    ------------------------------------------------------------------------------------------------------------------  Recent Labs  04/25/16 0426  CHOL 196  HDL 38*  LDLCALC 127*  TRIG 154*  CHOLHDL 5.2    Lab Results  Component Value Date   HGBA1C 12.9 (H) 04/25/2016    ------------------------------------------------------------------------------------------------------------------ No results for input(s): TSH, T4TOTAL, T3FREE, THYROIDAB in the last 72 hours.  Invalid input(s): FREET3 ------------------------------------------------------------------------------------------------------------------ No results for input(s): VITAMINB12, FOLATE, FERRITIN, TIBC, IRON, RETICCTPCT in the last 72 hours.  Coagulation profile  Recent Labs Lab 04/24/16 0007  INR 1.21    No results for input(s): DDIMER in the last 72 hours.  Cardiac Enzymes No results for input(s): CKMB, TROPONINI, MYOGLOBIN in the last 168 hours.  Invalid input(s): CK ------------------------------------------------------------------------------------------------------------------ No results found for: BNP   Dillon Mcreynolds M.D on 04/27/2016 at 9:23 AM  Between 7am to 7pm - Pager - [phone]  After 7pm go to [email or web address] - password [email or web address]  [facility] -  Office  [phone]  Dragon dictation system was used to create this note, attempts have been made to correct errors, however presence of uncorrected errors is not a reflection quality of care provided

## 2016-04-28 DIAGNOSIS — I82401 Acute embolism and thrombosis of unspecified deep veins of right lower extremity: Secondary | ICD-10-CM | POA: Diagnosis not present

## 2016-04-28 DIAGNOSIS — I2692 Saddle embolus of pulmonary artery without acute cor pulmonale: Secondary | ICD-10-CM | POA: Diagnosis not present

## 2016-04-28 DIAGNOSIS — I2602 Saddle embolus of pulmonary artery with acute cor pulmonale: Secondary | ICD-10-CM | POA: Diagnosis not present

## 2016-04-28 DIAGNOSIS — I639 Cerebral infarction, unspecified: Secondary | ICD-10-CM | POA: Diagnosis not present

## 2016-04-28 DIAGNOSIS — I509 Heart failure, unspecified: Secondary | ICD-10-CM | POA: Diagnosis not present

## 2016-04-28 DIAGNOSIS — I130 Hypertensive heart and chronic kidney disease with heart failure and stage 1 through stage 4 chronic kidney disease, or unspecified chronic kidney disease: Secondary | ICD-10-CM | POA: Diagnosis not present

## 2016-04-28 DIAGNOSIS — E1122 Type 2 diabetes mellitus with diabetic chronic kidney disease: Secondary | ICD-10-CM | POA: Diagnosis not present

## 2016-04-28 DIAGNOSIS — E871 Hypo-osmolality and hyponatremia: Secondary | ICD-10-CM | POA: Diagnosis not present

## 2016-04-28 LAB — CBC
HEMATOCRIT: 23.6 % — AB (ref 36.0–46.0)
Hemoglobin: 7.7 g/dL — ABNORMAL LOW (ref 12.0–15.0)
MCH: 30.6 pg (ref 26.0–34.0)
MCHC: 32.6 g/dL (ref 30.0–36.0)
MCV: 93.7 fL (ref 78.0–100.0)
PLATELETS: 230 10*3/uL (ref 150–400)
RBC: 2.52 MIL/uL — ABNORMAL LOW (ref 3.87–5.11)
RDW: 17.1 % — AB (ref 11.5–15.5)
WBC: 9.2 10*3/uL (ref 4.0–10.5)

## 2016-04-28 LAB — GLUCOSE, CAPILLARY
GLUCOSE-CAPILLARY: 142 mg/dL — AB (ref 65–99)
GLUCOSE-CAPILLARY: 123 mg/dL — AB (ref 65–99)
Glucose-Capillary: 159 mg/dL — ABNORMAL HIGH (ref 65–99)
Glucose-Capillary: 172 mg/dL — ABNORMAL HIGH (ref 65–99)
Glucose-Capillary: 216 mg/dL — ABNORMAL HIGH (ref 65–99)
Glucose-Capillary: 252 mg/dL — ABNORMAL HIGH (ref 65–99)

## 2016-04-28 LAB — HEMOGLOBIN AND HEMATOCRIT, BLOOD
HCT: 23.7 % — ABNORMAL LOW (ref 36.0–46.0)
HEMOGLOBIN: 7.8 g/dL — AB (ref 12.0–15.0)

## 2016-04-28 LAB — CREATININE, SERUM: CREATININE: 0.81 mg/dL (ref 0.44–1.00)

## 2016-04-28 MED ORDER — WHITE PETROLATUM GEL
Status: AC
  Administered 2016-04-28: 09:00:00
  Filled 2016-04-28: qty 1

## 2016-04-28 MED ORDER — ALBUTEROL SULFATE (2.5 MG/3ML) 0.083% IN NEBU
2.5000 mg | INHALATION_SOLUTION | Freq: Three times a day (TID) | RESPIRATORY_TRACT | Status: DC
  Administered 2016-04-28 – 2016-04-30 (×4): 2.5 mg via RESPIRATORY_TRACT
  Filled 2016-04-28 (×6): qty 3

## 2016-04-28 MED ORDER — INSULIN GLARGINE 100 UNIT/ML ~~LOC~~ SOLN
10.0000 [IU] | Freq: Every day | SUBCUTANEOUS | Status: DC
Start: 2016-04-28 — End: 2016-05-01
  Administered 2016-04-28 – 2016-04-30 (×3): 10 [IU] via SUBCUTANEOUS
  Filled 2016-04-28 (×4): qty 0.1

## 2016-04-28 MED ORDER — GUAIFENESIN ER 600 MG PO TB12
600.0000 mg | ORAL_TABLET | Freq: Two times a day (BID) | ORAL | Status: DC
  Administered 2016-04-28 – 2016-05-03 (×11): 600 mg via ORAL
  Filled 2016-04-28 (×11): qty 1

## 2016-04-28 NOTE — Progress Notes (Signed)
Inpatient Diabetes Program Recommendations  AACE/ADA: New Consensus Statement on Inpatient Glycemic Control (2015)  Target Ranges:  Prepandial:   less than 140 mg/dL      Peak postprandial:   less than 180 mg/dL (1-2 hours)      Critically ill patients:  140 - 180 mg/dL   Lab Results  Component Value Date   GLUCAP 123 (H) 04/28/2016   HGBA1C 12.9 (H) 04/25/2016   Results for Carrie Watts, Carrie Watts (MRN [id number]) as of 04/28/2016 11:17  Ref. Range 04/27/2016 07:59 04/27/2016 11:52 04/27/2016 17:13 04/27/2016 20:39 04/28/2016 00:06 04/28/2016 04:49 04/28/2016 07:46  Glucose-Capillary Latest Ref Range: 65 - 99 mg/dL 136 (H) 193 (H) 289 (H) 230 (H) 159 (H) 142 (H) 123 (H)   Review of Glycemic Control  Diabetes history: DM, Renal disorder Outpatient Diabetes medications: none Current orders for Inpatient glycemic control: Novolog 0-9 units Q4H  Inpatient Diabetes Program Recommendations:     Based on CBG trends, patient's blood glucose has spiked in the afternoons and early evenings for the past 3 days.    Please consider meal coverage of Novolog 3 units TIDAC if patient eats > 50% of meals.  Thank you,  Windy Carina, RN, MSN Diabetes Coordinator Inpatient Diabetes Program [phone] (Team Pager)

## 2016-04-28 NOTE — Progress Notes (Signed)
Pt coughs out bloody sputum each time she cough. MD notified.

## 2016-04-28 NOTE — Progress Notes (Signed)
Patient Demographics:    Carrie Watts, is a 66 y.o. female, DOB - 1950-12-12, [id number]  Admit date - 04/24/2016   Admitting Physician Etta Quill, DO  Outpatient Primary MD for the patient is No primary care provider on file.  LOS - 4   Chief Complaint  Patient presents with  . difficulty speaking        Subjective:    [name] today has no fevers, no emesis,  Has sob  Cough with bllody streaks/specks  Assessment  & Plan :    Principal Problem:   Saddle pulmonary embolus (HCC) Active Problems:   DM2 (diabetes mellitus, type 2) (HCC)   Stroke-like symptoms   Acute saddle pulmonary embolism with acute cor pulmonale (HCC)   History of cancer of left breast   Pressure injury of skin   Acute deep vein thrombosis (DVT) of right lower extremity (HCC)   Hyperlipidemia  Brief Narrative   66 year old female with history of diabetes mellitus, left breast cancer status post lumpectomy who recently flew back from [place] presented with right-sided facial droop with difficulty speaking, chest discomfort, cough and shortness of breath. These symptoms were going on for few days. Workup done in the ED showed a saddle pulmonary emboli. Admitted to hospitalist service on anticoagulation. PC CM consulted. Neurology consulted for strokelike symptoms.  Pt was seen at vascular lab. Her TCD Bubble study negative for PFO. However, She was found to have right LE DVT. She still on Lovenox therapeutic dose   [name] is a 66 y.o. female with history of breast CA, CHF, CKD, and DM  presenting with one week history of right sided weakness and speech difficulties.. She did not receive IV t-PA due to late presentation.  1)PE with saddle embolus and right heart strain/Right LE DVT - ? related to long flight travel vs. Hypercoagulable state secondary to breast cancer-  continue therapeutic Lovenox, TCD  bubble study no PFO. Hemoptysis persist  2)Stuttering speech - resolved speech concerns,  MRI and MRA of the brain without acute stroke findings, patient has old basal ganglia infarct, echo with EF of 55-60% without PFO on bubble study, LDL is 127, carotid Dopplers without acute findings, continue aspirin and Lipitor   3)Chiari malformation- 9 mm tonsillar descent without visible hydromyelia, asymptomatic, outpatient neurosurgical consult advised  4)Hypertension- stable at this time  5)Diabetes- A1c is over 12, start Lantus insulin 10 units daily at bedtime and Use Novolog/Humalog Sliding scale insulin with Accu-Cheks/Fingersticks as ordered   6)Breast cancer - s/p mastectomy and undergoing chemotherapy, To rule out metastatic disease on chest CT - F/U non contrast chest CT in 3 - 6 months.   Code Status : Full    Disposition Plan  : to be determined   Consults  :   Neurology signed off   DVT Prophylaxis  :  Lovenox  Lab Results  Component Value Date   PLT 230 04/28/2016    Inpatient Medications  Scheduled Meds: .  stroke: mapping our early stages of recovery book   Does not apply Once  . atorvastatin  20 mg Oral QHS  . enoxaparin (LOVENOX) injection  80 mg Subcutaneous Q12H  . insulin aspart  0-9 Units Subcutaneous Q4H  . polyethylene  glycol  17 g Oral Daily   Continuous Infusions: PRN Meds:.acetaminophen **OR** acetaminophen (TYLENOL) oral liquid 160 mg/5 mL **OR** acetaminophen, alum & mag hydroxide-simeth, docusate sodium, guaiFENesin-dextromethorphan, ondansetron (ZOFRAN) IV    Anti-infectives    Start     Dose/Rate Route Frequency Ordered Stop   04/24/16 0445  vancomycin (VANCOCIN) 1,500 mg in sodium chloride 0.9 % 500 mL IVPB     1,500 mg 250 mL/hr over 120 Minutes Intravenous  Once 04/24/16 0440 04/24/16 0659   04/24/16 0445  piperacillin-tazobactam (ZOSYN) IVPB 3.375 g     3.375 g 100 mL/hr over 30 Minutes Intravenous  Once 04/24/16 0440 04/24/16 0526          Objective:   Vitals:   04/27/16 1500 04/27/16 2220 04/28/16 0235 04/28/16 0452  BP:  118/70 120/65 117/63  Pulse:  90 (!) 101 (!) 101  Resp:  18 18   Temp:  100 F (37.8 C) 98.3 F (36.8 C) 98.1 F (36.7 C)  TempSrc:  Oral    SpO2:  96% 91% 99%  Weight: 79.1 kg (174 lb 6.4 oz)     Height:        Wt Readings from Last 3 Encounters:  04/27/16 79.1 kg (174 lb 6.4 oz)     Intake/Output Summary (Last 24 hours) at 04/28/16 0925 Last data filed at 04/28/16 0425  Gross per 24 hour  Intake              340 ml  Output              200 ml  Net              140 ml     Physical Exam  Gen:- Awake Alert,  In no apparent distress  HEENT:- [place].AT, No sclera icterus Neck-Supple Neck,No JVD,.  Lungs-  CTAB  CV- S1, S2 normal Abd-  +ve B.Sounds, Abd Soft, No tenderness,    Extremity/Skin:- Intact pulses    Data Review:   Micro Results Recent Results (from the past 240 hour(s))  MRSA PCR Screening     Status: None   Collection Time: 04/24/16  6:31 AM  Result Value Ref Range Status   MRSA by PCR NEGATIVE NEGATIVE Final    Comment:        The GeneXpert MRSA Assay (FDA approved for NASAL specimens only), is one component of a comprehensive MRSA colonization surveillance program. It is not intended to diagnose MRSA infection nor to guide or monitor treatment for MRSA infections.     Radiology Reports Dg Chest 2 View  Result Date: 04/24/2016 CLINICAL DATA:  Cough. History of hypertension and CHF. History of breast cancer. Patient has had loss of function on the right side over body and has difficulty speaking. EXAM: CHEST  2 VIEW COMPARISON:  None. FINDINGS: Normal heart size and pulmonary vascularity. No focal airspace disease or consolidation in the lungs. No blunting of costophrenic angles. No pneumothorax. Mediastinal contours appear intact. Surgical clips in the left axilla. Degenerative changes in the spine. IMPRESSION: No active cardiopulmonary disease.  Electronically Signed   By: Lucienne Capers M.D.   On: 04/24/2016 01:33   Ct Head Wo Contrast  Result Date: 04/24/2016 CLINICAL DATA:  Acute onset of slurred speech and bilateral leg weakness. Initial encounter. EXAM: CT HEAD WITHOUT CONTRAST TECHNIQUE: Contiguous axial images were obtained from the base of the skull through the vertex without intravenous contrast. COMPARISON:  None. FINDINGS: Brain: No evidence of acute infarction, hemorrhage,  hydrocephalus, extra-axial collection or mass lesion/mass effect. Prominence of the ventricles and sulci reflects mild cortical volume loss. Scattered periventricular subcortical white matter change likely reflects small vessel ischemic microangiopathy. A small chronic lacunar infarct is noted at the right basal ganglia. The brainstem and fourth ventricle are within normal limits. The cerebral hemispheres demonstrate grossly normal gray-white differentiation. No mass effect or midline shift is seen. Vascular: No hyperdense vessel or unexpected calcification. Skull: There is no evidence of fracture; visualized osseous structures are unremarkable in appearance. Sinuses/Orbits: The orbits are within normal limits. The paranasal sinuses and mastoid air cells are well-aerated. Other: No significant soft tissue abnormalities are seen. IMPRESSION: 1. No acute intracranial pathology seen on CT. 2. Mild cortical volume loss and scattered small vessel ischemic microangiopathy. 3. Small chronic lacunar infarct at the right basal ganglia. Electronically Signed   By: Garald Balding M.D.   On: 04/24/2016 01:54   Ct Angio Chest Pe W And/or Wo Contrast  Result Date: 04/24/2016 CLINICAL DATA:  Cough, chest pain, and shortness of breath for 2 weeks. History of breast cancer post left mastectomy. Chemotherapy. Multiple recent strokes. Diabetes. EXAM: CT ANGIOGRAPHY CHEST WITH CONTRAST TECHNIQUE: Multidetector CT imaging of the chest was performed using the standard protocol during  bolus administration of intravenous contrast. Multiplanar CT image reconstructions and MIPs were obtained to evaluate the vascular anatomy. CONTRAST:  100 mL Isovue 370 COMPARISON:  None. FINDINGS: Cardiovascular: Filling defects demonstrated in the main and bilateral lobar pulmonary artery is extending into bilateral upper and lower lobe branches consistent with saddle embolus with moderately large clot burden. The RV to LV ratio is elevated at 1.19, suggesting risk of right heart strain. No pericardial effusion. Dilated ascending thoracic aorta at 3.5 cm. No dissection. Scattered calcification. Mediastinum/Nodes: Small esophageal hiatal hernia. Esophagus is decompressed. No significant lymphadenopathy in the chest. Lungs/Pleura: Evaluation is limited due to motion artifact. There patchy nodular ground-glass infiltrative changes demonstrated centrally in the lungs, in the lung bases, and scattered throughout the upper lungs. This may be due to multifocal pneumonia, edema, or infarcts. Metastatic disease would be a less likely consideration. No pleural effusions. No pneumothorax. Upper Abdomen: No acute abnormality. Musculoskeletal: Postoperative changes consistent with left mastectomy and surgical clips in the left axilla. No destructive bone lesions. Review of the MIP images confirms the above findings. IMPRESSION: Positive examination for pulmonary embolus, including saddle embolus and bilateral lobar and segmental pulmonary emboli. Moderately large clot burden. Positive for acute PE with CT evidence of right heart strain (RV/LV Ratio = 1.19) consistent with at least submassive (intermediate risk) PE. The presence of right heart strain has been associated with an increased risk of morbidity and mortality. Please activate Code PE by paging [phone]. Patchy nodular ground-glass infiltrative changes in the lungs may be due to multifocal pneumonia, edema, or infarcts. Metastatic disease would be a less likely  consideration but due to history of primary carcinoma, non-contrast chest CT at 3-6 months is recommended. If nodules persist, subsequent management will be based upon the most suspicious nodule(s). This recommendation follows the consensus statement: Guidelines for Management of Incidental Pulmonary Nodules Detected on CT Images: From the Fleischner Society 2017; Radiology 2017; 284:228-243. These results were called by telephone at the time of interpretation on 04/24/2016 at 4:26 am to Dr. Deno Etienne , who verbally acknowledged these results. Electronically Signed   By: Lucienne Capers M.D.   On: 04/24/2016 04:32   Mr Brain Wo Contrast  Result Date: 04/24/2016 CLINICAL DATA:  Right-sided facial droop and difficulty speaking, symptoms for 1 week. History of breast cancer currently on chemotherapy. Large saddle embolus on CT chest. Query paradoxical emboli. History of diabetes. EXAM: MRI HEAD WITHOUT CONTRAST MRA HEAD WITHOUT CONTRAST TECHNIQUE: Multiplanar, multiecho pulse sequences of the brain and surrounding structures were obtained without intravenous contrast. Angiographic images of the head were obtained using MRA technique without contrast. COMPARISON:  CT chest 04/24/2016.  CT head 04/24/2016. FINDINGS: MRI HEAD FINDINGS Brain: No evidence for acute infarction, hemorrhage, mass lesion, hydrocephalus, or extra-axial fluid. Moderate atrophy. Advanced T2 and FLAIR hyperintensities throughout the white matter, representing either diabetic related chronic microvascular ischemic change or post treatment effect from chemotherapy. Scattered areas of lacunar infarction in the deep nuclei and subcortical white matter. Brainstem and cerebellum spared. 9 mm of tonsillar descent below the foramen magnum consistent with Chiari I malformation. No visible hydromyelia. Partial empty sella. Vascular: Normal flow voids. Skull and upper cervical spine: Normal marrow signal. Large disc extrusion at C3-4, evaluated only on  sagittal images, causing significant cord compression. Consider cervical spine MRI for further evaluation. Sinuses/Orbits: Negative. Other: None. MRA HEAD FINDINGS The internal carotid arteries are widely patent. Basilar artery is widely patent with vertebrals codominant. There is no intracranial stenosis or visible aneurysm. IMPRESSION: Moderate atrophy with advanced nonacute white matter signal abnormality. No visible acute stroke. Chiari I malformation. 9 mm tonsillar descent without visible hydromyelia. Within limits for assessment on noncontrast brain, no intracranial metastatic disease or visible osseous disease. No intracranial stenosis or occlusion. Electronically Signed   By: Staci Righter M.D.   On: 04/24/2016 13:34   Mr Jodene Nam Head/brain [id number] Cm  Result Date: 04/24/2016 CLINICAL DATA:  Right-sided facial droop and difficulty speaking, symptoms for 1 week. History of breast cancer currently on chemotherapy. Large saddle embolus on CT chest. Query paradoxical emboli. History of diabetes. EXAM: MRI HEAD WITHOUT CONTRAST MRA HEAD WITHOUT CONTRAST TECHNIQUE: Multiplanar, multiecho pulse sequences of the brain and surrounding structures were obtained without intravenous contrast. Angiographic images of the head were obtained using MRA technique without contrast. COMPARISON:  CT chest 04/24/2016.  CT head 04/24/2016. FINDINGS: MRI HEAD FINDINGS Brain: No evidence for acute infarction, hemorrhage, mass lesion, hydrocephalus, or extra-axial fluid. Moderate atrophy. Advanced T2 and FLAIR hyperintensities throughout the white matter, representing either diabetic related chronic microvascular ischemic change or post treatment effect from chemotherapy. Scattered areas of lacunar infarction in the deep nuclei and subcortical white matter. Brainstem and cerebellum spared. 9 mm of tonsillar descent below the foramen magnum consistent with Chiari I malformation. No visible hydromyelia. Partial empty sella. Vascular: Normal  flow voids. Skull and upper cervical spine: Normal marrow signal. Large disc extrusion at C3-4, evaluated only on sagittal images, causing significant cord compression. Consider cervical spine MRI for further evaluation. Sinuses/Orbits: Negative. Other: None. MRA HEAD FINDINGS The internal carotid arteries are widely patent. Basilar artery is widely patent with vertebrals codominant. There is no intracranial stenosis or visible aneurysm. IMPRESSION: Moderate atrophy with advanced nonacute white matter signal abnormality. No visible acute stroke. Chiari I malformation. 9 mm tonsillar descent without visible hydromyelia. Within limits for assessment on noncontrast brain, no intracranial metastatic disease or visible osseous disease. No intracranial stenosis or occlusion. Electronically Signed   By: Staci Righter M.D.   On: 04/24/2016 13:34     CBC  Recent Labs Lab 04/24/16 0007 04/24/16 0027 04/25/16 0426 04/26/16 0439 04/27/16 0535 04/28/16 0537  WBC 8.7  --  11.2* 9.3 8.2 PENDING  HGB 10.1*  10.2* 8.4* 8.2* 8.3* 7.7*  HCT 29.3* 30.0* 25.1* 24.7* 25.2* 23.6*  PLT 216  --  210 226 228 230  MCV 89.3  --  92.3 90.8 92.0 93.7  MCH 30.8  --  30.9 30.1 30.3 30.6  MCHC 34.5  --  33.5 33.2 32.9 32.6  RDW 15.5  --  16.6* 16.7* 16.9* 17.1*  LYMPHSABS 1.4  --   --   --   --   --   MONOABS 1.0  --   --   --   --   --   EOSABS 0.1  --   --   --   --   --   BASOSABS 0.0  --   --   --   --   --     Chemistries   Recent Labs Lab 04/24/16 0007 04/24/16 0027 04/25/16 0426 04/28/16 0537  NA 130* 130* 139  --   K 4.0 3.9 3.8  --   CL 100* 101 111  --   CO2 19*  --  19*  --   GLUCOSE 324* 334* 117*  --   BUN 14 14 5*  --   CREATININE 1.06* 0.90 0.85 0.81  CALCIUM 9.5  --  8.8*  --   AST 30  --   --   --   ALT 22  --   --   --   ALKPHOS 83  --   --   --   BILITOT 0.6  --   --   --     ------------------------------------------------------------------------------------------------------------------ No results for input(s): CHOL, HDL, LDLCALC, TRIG, CHOLHDL, LDLDIRECT in the last 72 hours.  Lab Results  Component Value Date   HGBA1C 12.9 (H) 04/25/2016   ------------------------------------------------------------------------------------------------------------------ No results for input(s): TSH, T4TOTAL, T3FREE, THYROIDAB in the last 72 hours.  Invalid input(s): FREET3 ------------------------------------------------------------------------------------------------------------------ No results for input(s): VITAMINB12, FOLATE, FERRITIN, TIBC, IRON, RETICCTPCT in the last 72 hours.  Coagulation profile  Recent Labs Lab 04/24/16 0007  INR 1.21    No results for input(s): DDIMER in the last 72 hours.  Cardiac Enzymes No results for input(s): CKMB, TROPONINI, MYOGLOBIN in the last 168 hours.  Invalid input(s): CK ------------------------------------------------------------------------------------------------------------------ No results found for: BNP   Syla Devoss M.D on 04/28/2016 at 9:25 AM  Between 7am to 7pm - Pager - [phone]  After 7pm go to [email or web address] - password [email or web address]  [facility] -  Office  [phone]  Dragon dictation system was used to create this note, attempts have been made to correct errors, however presence of uncorrected errors is not a reflection quality of care provided

## 2016-04-29 DIAGNOSIS — I509 Heart failure, unspecified: Secondary | ICD-10-CM | POA: Diagnosis not present

## 2016-04-29 DIAGNOSIS — I130 Hypertensive heart and chronic kidney disease with heart failure and stage 1 through stage 4 chronic kidney disease, or unspecified chronic kidney disease: Secondary | ICD-10-CM | POA: Diagnosis not present

## 2016-04-29 DIAGNOSIS — I2602 Saddle embolus of pulmonary artery with acute cor pulmonale: Secondary | ICD-10-CM | POA: Diagnosis not present

## 2016-04-29 DIAGNOSIS — E871 Hypo-osmolality and hyponatremia: Secondary | ICD-10-CM | POA: Diagnosis not present

## 2016-04-29 DIAGNOSIS — I82401 Acute embolism and thrombosis of unspecified deep veins of right lower extremity: Secondary | ICD-10-CM | POA: Diagnosis not present

## 2016-04-29 DIAGNOSIS — I639 Cerebral infarction, unspecified: Secondary | ICD-10-CM | POA: Diagnosis not present

## 2016-04-29 DIAGNOSIS — I2692 Saddle embolus of pulmonary artery without acute cor pulmonale: Secondary | ICD-10-CM | POA: Diagnosis not present

## 2016-04-29 DIAGNOSIS — E1122 Type 2 diabetes mellitus with diabetic chronic kidney disease: Secondary | ICD-10-CM | POA: Diagnosis not present

## 2016-04-29 LAB — GLUCOSE, CAPILLARY
Glucose-Capillary: 119 mg/dL — ABNORMAL HIGH (ref 65–99)
Glucose-Capillary: 122 mg/dL — ABNORMAL HIGH (ref 65–99)
Glucose-Capillary: 126 mg/dL — ABNORMAL HIGH (ref 65–99)
Glucose-Capillary: 163 mg/dL — ABNORMAL HIGH (ref 65–99)
Glucose-Capillary: 202 mg/dL — ABNORMAL HIGH (ref 65–99)
Glucose-Capillary: 89 mg/dL (ref 65–99)

## 2016-04-29 LAB — CBC
HEMATOCRIT: 24.1 % — AB (ref 36.0–46.0)
HEMOGLOBIN: 7.8 g/dL — AB (ref 12.0–15.0)
MCH: 30.5 pg (ref 26.0–34.0)
MCHC: 32.4 g/dL (ref 30.0–36.0)
MCV: 94.1 fL (ref 78.0–100.0)
Platelets: 201 10*3/uL (ref 150–400)
RBC: 2.56 MIL/uL — AB (ref 3.87–5.11)
RDW: 17.4 % — ABNORMAL HIGH (ref 11.5–15.5)
WBC: 7.6 10*3/uL (ref 4.0–10.5)

## 2016-04-29 LAB — BASIC METABOLIC PANEL
Anion gap: 7 (ref 5–15)
BUN: 7 mg/dL (ref 6–20)
CO2: 25 mmol/L (ref 22–32)
Calcium: 8.8 mg/dL — ABNORMAL LOW (ref 8.9–10.3)
Chloride: 108 mmol/L (ref 101–111)
Creatinine, Ser: 0.78 mg/dL (ref 0.44–1.00)
GFR calc Af Amer: >60 mL/min (ref >60)
GFR calc non Af Amer: >60 mL/min (ref >60)
Glucose, Bld: 93 mg/dL (ref 65–99)
POTASSIUM: 3.6 mmol/L (ref 3.5–5.1)
Sodium: 140 mmol/L (ref 135–145)

## 2016-04-29 MED ORDER — COUMADIN BOOK
Freq: Once | Status: DC
  Filled 2016-04-29: qty 1

## 2016-04-29 MED ORDER — WARFARIN - PHARMACIST DOSING INPATIENT
Freq: Every day | Status: DC
  Administered 2016-04-30 – 2016-05-01 (×2)

## 2016-04-29 MED ORDER — WARFARIN SODIUM 5 MG PO TABS
5.0000 mg | ORAL_TABLET | Freq: Every day | ORAL | Status: DC
  Administered 2016-04-29 – 2016-04-30 (×2): 5 mg via ORAL
  Filled 2016-04-29 (×2): qty 1

## 2016-04-29 MED ORDER — WARFARIN VIDEO: Freq: Once | Status: DC

## 2016-04-29 NOTE — Progress Notes (Signed)
Physical Therapy Treatment Patient Details Name: Carrie Watts MRN: [id number] DOB: [birth year] Today's Date: 04/29/2016    History of Present Illness 66 y/o female came to the [facility] ED on 1/14 with cough, dyspnea and chest pain in the setting of two weeks of R sided facial droop and slurred speech after returning from [place] on 1/13. Noted to have a large PE on CT angiogram. MRI and MRA negative.    PT Comments    Pt able to tolerate longer trial of ambulation today with 2 L of 02 to keep sats above 90%. Educated pt on importance of sitting upright throughout the day and continuing to walk each day. Pt communicated understanding and stated she wants to continue walking every day and get stronger. Notified nursing of pt feeling woozy prior to tx, episode of tachycardia and pt coughing up blood in kleenex. Pt very limited due to fatigue but would benefit from continued ambulation with vitals monitored and general LE strengthening.    Follow Up Recommendations  SNF;Supervision/Assistance - 24 hour     Equipment Recommendations  None recommended by PT    Recommendations for Other Services       Precautions / Restrictions Precautions Precautions: Fall Precaution Comments: SOB with activity Restrictions Weight Bearing Restrictions: No    Mobility  Bed Mobility Overal bed mobility: Needs Assistance Bed Mobility: Supine to Sit;Sit to Supine     Supine to sit: Modified independent (Device/Increase time) Sit to supine: Min assist   General bed mobility comments: pt O2 96-98% at rest on RA prior to initiating transfer. Required increase time to elevate trunk into sitting with use of railing, Min A returning to bed to assist pt with LE elevation to bed.  Transfers Overall transfer level: Needs assistance Equipment used: Rolling walker (2 wheeled) Transfers: Sit to/from Stand Sit to Stand: Min guard         General transfer comment: Cues for hand placement to push from seated  surface, min guard for safety  Ambulation/Gait Ambulation/Gait assistance: Min assist Ambulation Distance (Feet): 70 Feet Assistive device: Rolling walker (2 wheeled) Gait Pattern/deviations: Step-through pattern;Decreased stride length;Narrow base of support;Trunk flexed Gait velocity: slow   General Gait Details: Pt able to ambulate with minimal cuing to stay within boundaries of RW during turns. Required 2 L of 02 to keep sats above 90%   Stairs            Wheelchair Mobility    Modified Rankin (Stroke Patients Only)       Balance Overall balance assessment: Needs assistance Sitting-balance support: Feet unsupported Sitting balance-Leahy Scale: Fair     Standing balance support: Bilateral upper extremity supported;During functional activity Standing balance-Leahy Scale: Poor Standing balance comment: needs UE support for safe static and dynamic standing activity.                    Cognition Arousal/Alertness: Awake/alert Behavior During Therapy: Anxious;WFL for tasks assessed/performed Overall Cognitive Status: Within Functional Limits for tasks assessed               Problem Solving: Slow processing;Difficulty sequencing General Comments: Language barrier but does speak english.      Exercises      General Comments        Pertinent Vitals/Pain Pain Assessment: No/denies pain    Home Living                      Prior Function  PT Goals (current goals can now be found in the care plan section) Acute Rehab PT Goals Patient Stated Goal: to get better PT Goal Formulation: With patient Potential to Achieve Goals: Good Progress towards PT goals: Progressing toward goals    Frequency    Min 3X/week      PT Plan Current plan remains appropriate    Co-evaluation             End of Session Equipment Utilized During Treatment: Gait belt;Oxygen Activity Tolerance: Patient tolerated treatment well;Patient  limited by lethargy (pt stated feeling "woozy" prior to tx) Patient left: with call bell/phone within reach;in bed;with bed alarm set     Time: 1356-1419 PT Time Calculation (min) (ACUTE ONLY): 23 min  Charges:  $Gait Training: 8-22 mins $Therapeutic Activity: 8-22 mins                    G Codes:      [facility] May 25, 2016, 2:36 PM Olena [place], [place] Pager [phone]

## 2016-04-29 NOTE — Progress Notes (Signed)
SATURATION QUALIFICATIONS: (This note is used to comply with regulatory documentation for home oxygen)  Patient Saturations on Room Air at Rest = 98% then patient sat up and desaturated 86% on RA  Patient Saturations on Room Air while Ambulating = 88%  Patient Saturations on 1 Liters of oxygen while Ambulating =89%  Patient Saturations on 2 Liters of oxygen while Ambulating =91%-95%   Please briefly explain why patient needs home oxygen:  Desaturates with activity.    Governor Rooks, PTA pager [phone]

## 2016-04-29 NOTE — Progress Notes (Signed)
ANTICOAGULATION CONSULT NOTE - follow up  Pharmacy Consult for Lovenox -> Coumadin Indication: pulmonary embolus /DVT RLE  Allergies  Allergen Reactions  . Dust Mite Extract     Patient Measurements: Height: 5\' 3"  (160 cm) Weight: 174 lb 6.4 oz (79.1 kg) IBW/kg (Calculated) : 52.4   Addendum: re-weighed 04/27/16 = 79.1 kg    Vital Signs:    Labs:  Recent Labs  04/27/16 0535 04/28/16 0537 04/28/16 1615 04/29/16 0646  HGB 8.3* 7.7* 7.8* 7.8*  HCT 25.2* 23.6* 23.7* 24.1*  PLT 228 230  --  201  CREATININE  --  0.81  --  0.78    Estimated Creatinine Clearance: 68 mL/min (by C-G formula based on SCr of 0.78 mg/dL).   Medical History: Past Medical History:  Diagnosis Date  . Cancer (HCC)    breast  . CHF (congestive heart failure) ([place])   . Diabetes mellitus without complication ([place])   . Renal disorder     Assessment: 66yo female with history of left breast cancer s/p lumpectomy (1 yr ago), on chemotherapy in [place] c/o focal weakness and speech change as well as SOB and CP 2/2 cough, CT of head is negative, CT chest shows acute  saddle PE.. MD notes PE likely due to hypercoagulable state from underlying breast cancer and recent travel. Lovenox full dose began on 04/24/16.  Continue Lovenox treatment dose for PE/DVT RLE Lower extremity Doppler shows acute DVT involving right saphenofemoral junction, femoral vein, popliteal vein, posterior tibial and peroneal veins. No DVT on left leg. The plan is to continue Lovenox treatment dose. Outpatient prescription approved to be filled under [facility] program for 1 month supply then daughter states she will be able to get Lovenox in [place] when patient returns.   Starting Coumadin 1/19 PM, will need to continue Lovenox overlap  Goal of Therapy:  Anti-Xa level 0.6-1 units/ml 4hrs after LMWH dose given Monitor platelets by anticoagulation protocol: Yes  INR = 2 to 3   Plan:  Continue Lovenox 80 mg sq Q 12 hours Add Coumadin 5  mg po daily at 1800 pm Daily INR Coumadin education  Thank you Anette Guarneri, PharmD [phone] 04/29/2016,6:23 PM

## 2016-04-29 NOTE — Progress Notes (Signed)
Inpatient Diabetes Program Recommendations  AACE/ADA: New Consensus Statement on Inpatient Glycemic Control (2015)  Target Ranges:  Prepandial:   less than 140 mg/dL      Peak postprandial:   less than 180 mg/dL (1-2 hours)      Critically ill patients:  140 - 180 mg/dL   Lab Results  Component Value Date   GLUCAP 122 (H) 04/29/2016   HGBA1C 12.9 (H) 04/25/2016   Results for ALIMA, ALTO (MRN [id number]) as of 04/29/2016 09:55  Ref. Range 04/28/2016 07:46 04/28/2016 12:04 04/28/2016 16:53 04/28/2016 20:34 04/29/2016 00:06 04/29/2016 04:09 04/29/2016 07:48  Glucose-Capillary Latest Ref Range: 65 - 99 mg/dL 123 (H) 172 (H) 216 (H) 252 (H) 126 (H) 89 122 (H)   Inpatient Diabetes Program Recommendations:     Based on CBG trends, patient's blood glucose has spiked in the afternoons and early evenings for the past 3 days.  This 0400, low CBG of 89.    Please consider meal coverage of Novolog 3 units TIDAC if patient eats > 50% of meals.      Also, please consider changing Correction SSI schedule to Sensitive Novolog 0-9 units TID with meals and 0-5 units QHS (and no coverage at 0400).  Thank you,  Windy Carina, RN, MSN Diabetes Coordinator Inpatient Diabetes Program [phone] (Team Pager)

## 2016-04-29 NOTE — Progress Notes (Signed)
Patient Demographics:    Carrie Watts, is a 66 y.o. female, DOB - 01-28-1951, [id number]  Admit date - 04/24/2016   Admitting Physician Etta Quill, DO  Outpatient Primary MD for the patient is No primary care provider on file.  LOS - 5   Chief Complaint  Patient presents with  . difficulty speaking        Subjective:    Carrie Watts today has no fevers, no emesis,  No chest pain,  Mild hemoptysis persist   Assessment  & Plan :    Principal Problem:   Saddle pulmonary embolus (HCC) Active Problems:   DM2 (diabetes mellitus, type 2) (HCC)   Stroke-like symptoms   Acute saddle pulmonary embolism with acute cor pulmonale (HCC)   History of cancer of left breast   Pressure injury of skin   Acute deep vein thrombosis (DVT) of right lower extremity ([place])   Hyperlipidemia  Brief Narrative  66 year old female with history of diabetes mellitus, left breast cancer status post lumpectomy who recently flew back from [place] presented with right-sided facial droop with difficulty speaking, chest discomfort, cough and shortness of breath. These symptoms were going on for few days. Workup done in the ED showed a saddle pulmonary emboli. Admitted to hospitalist service on anticoagulation. PC CM consulted. Neurology consulted for strokelike symptoms.  Pt was seen at vascular lab. Her TCD Bubble study negative for PFO. However, She was found to have right LE DVT. She still on Lovenox therapeutic dose   Carrie Watts a 66 y.o.femalewith history of breast CA, CHF, CKD, and DMpresenting with one week history of right sided weakness and speech difficulties.. Shedidnot receive IV t-PA due to late presentation.  1)PE with saddle embolus and right heart strain/Right LE DVT - ? related to long flight travel vs. Hypercoagulable state secondary to breast cancer-  continue therapeutic Lovenox, TCD  bubble study no PFO. Hemoptysis persist, pt and her daughter are considering coumadin due to cost, patient has no insurance, can only get Xarelto or Eliquis for 1 month, given history of underlying malignancy with hypercoagulable state Lovenox and Coumadin will be preferred over NOA. Start Coumadin therapy to overlap with Lovenox  2)Stuttering speech- resolved speech concerns,  MRI and MRA of the brain without acute stroke findings, patient has old basal ganglia infarct, echo with EF of 55-60% without PFO on bubble study, LDL is 127, carotid Dopplers without acute findings, continue aspirin and Lipitor  3)Chiari malformation- 9 mm tonsillar descent without visible hydromyelia, asymptomatic, outpatient neurosurgical consult advised  4)Hypertension- stable at this time  5)Diabetes- A1c is over 12,  C/n  Lantus insulin 10 units daily at bedtime and Use Novolog/Humalog Sliding scale insulin with Accu-Cheks/Fingersticks as ordered, start glipizide as patient has no insurance   6)Breast cancer - s/p mastectomy and undergoing chemotherapy, To rule out metastatic disease on chest CT - F/U non contrast chest CT in 3 - 6 months.  7)Social- discharge plan discussed with patient and patient's daughter Guy Begin who is an Therapist, sports, given lack of insurance and cost concerns in the post discharge environment Coumadin therapy will be initiated  Code Status : Full    Disposition Plan  : to be determined   Consults  :   Neurology  signed off   DVT Prophylaxis  :  Lovenox  Lab Results  Component Value Date   PLT 201 04/29/2016    Inpatient Medications  Scheduled Meds: .  stroke: mapping our early stages of recovery book   Does not apply Once  . albuterol  2.5 mg Nebulization TID  . atorvastatin  20 mg Oral QHS  . enoxaparin (LOVENOX) injection  80 mg Subcutaneous Q12H  . guaiFENesin  600 mg Oral BID  . insulin aspart  0-9 Units Subcutaneous Q4H  . insulin glargine  10 Units Subcutaneous QHS    . polyethylene glycol  17 g Oral Daily   Continuous Infusions: PRN Meds:.acetaminophen **OR** acetaminophen (TYLENOL) oral liquid 160 mg/5 mL **OR** acetaminophen, alum & mag hydroxide-simeth, docusate sodium, guaiFENesin-dextromethorphan, ondansetron (ZOFRAN) IV    Anti-infectives    Start     Dose/Rate Route Frequency Ordered Stop   04/24/16 0445  vancomycin (VANCOCIN) 1,500 mg in sodium chloride 0.9 % 500 mL IVPB     1,500 mg 250 mL/hr over 120 Minutes Intravenous  Once 04/24/16 0440 04/24/16 0659   04/24/16 0445  piperacillin-tazobactam (ZOSYN) IVPB 3.375 g     3.375 g 100 mL/hr over 30 Minutes Intravenous  Once 04/24/16 0440 04/24/16 0526        Objective:   Vitals:   04/28/16 2314 04/29/16 0523 04/29/16 1027 04/29/16 1032  BP: 113/66 (!) 102/48    Pulse: 95 97    Resp: (!) 22 (!) 22    Temp: 99.1 F (37.3 C) 99.5 F (37.5 C)    TempSrc:      SpO2: 98% 100% (!) 88% 95%  Weight:      Height:        Wt Readings from Last 3 Encounters:  04/27/16 79.1 kg (174 lb 6.4 oz)     Intake/Output Summary (Last 24 hours) at 04/29/16 1814 Last data filed at 04/29/16 1731  Gross per 24 hour  Intake              440 ml  Output              300 ml  Net              140 ml     Physical Exam  Gen:- Awake Alert,  In no apparent distress  HEENT:- [place].AT, No sclera icterus Neck-Supple Neck,No JVD,.  Lungs-  CTAB  CV- S1, S2 normal Abd-  +ve B.Sounds, Abd Soft, No tenderness,    Extremity/Skin:- No  edema,       Data Review:   Micro Results Recent Results (from the past 240 hour(s))  MRSA PCR Screening     Status: None   Collection Time: 04/24/16  6:31 AM  Result Value Ref Range Status   MRSA by PCR NEGATIVE NEGATIVE Final    Comment:        The GeneXpert MRSA Assay (FDA approved for NASAL specimens only), is one component of a comprehensive MRSA colonization surveillance program. It is not intended to diagnose MRSA infection nor to guide or monitor treatment  for MRSA infections.     Radiology Reports Dg Chest 2 View  Result Date: 04/24/2016 CLINICAL DATA:  Cough. History of hypertension and CHF. History of breast cancer. Patient has had loss of function on the right side over body and has difficulty speaking. EXAM: CHEST  2 VIEW COMPARISON:  None. FINDINGS: Normal heart size and pulmonary vascularity. No focal airspace disease or consolidation in the lungs.  No blunting of costophrenic angles. No pneumothorax. Mediastinal contours appear intact. Surgical clips in the left axilla. Degenerative changes in the spine. IMPRESSION: No active cardiopulmonary disease. Electronically Signed   By: Lucienne Capers M.D.   On: 04/24/2016 01:33   Ct Head Wo Contrast  Result Date: 04/24/2016 CLINICAL DATA:  Acute onset of slurred speech and bilateral leg weakness. Initial encounter. EXAM: CT HEAD WITHOUT CONTRAST TECHNIQUE: Contiguous axial images were obtained from the base of the skull through the vertex without intravenous contrast. COMPARISON:  None. FINDINGS: Brain: No evidence of acute infarction, hemorrhage, hydrocephalus, extra-axial collection or mass lesion/mass effect. Prominence of the ventricles and sulci reflects mild cortical volume loss. Scattered periventricular subcortical white matter change likely reflects small vessel ischemic microangiopathy. A small chronic lacunar infarct is noted at the right basal ganglia. The brainstem and fourth ventricle are within normal limits. The cerebral hemispheres demonstrate grossly normal gray-white differentiation. No mass effect or midline shift is seen. Vascular: No hyperdense vessel or unexpected calcification. Skull: There is no evidence of fracture; visualized osseous structures are unremarkable in appearance. Sinuses/Orbits: The orbits are within normal limits. The paranasal sinuses and mastoid air cells are well-aerated. Other: No significant soft tissue abnormalities are seen. IMPRESSION: 1. No acute  intracranial pathology seen on CT. 2. Mild cortical volume loss and scattered small vessel ischemic microangiopathy. 3. Small chronic lacunar infarct at the right basal ganglia. Electronically Signed   By: Garald Balding M.D.   On: 04/24/2016 01:54   Ct Angio Chest Pe W And/or Wo Contrast  Result Date: 04/24/2016 CLINICAL DATA:  Cough, chest pain, and shortness of breath for 2 weeks. History of breast cancer post left mastectomy. Chemotherapy. Multiple recent strokes. Diabetes. EXAM: CT ANGIOGRAPHY CHEST WITH CONTRAST TECHNIQUE: Multidetector CT imaging of the chest was performed using the standard protocol during bolus administration of intravenous contrast. Multiplanar CT image reconstructions and MIPs were obtained to evaluate the vascular anatomy. CONTRAST:  100 mL Isovue 370 COMPARISON:  None. FINDINGS: Cardiovascular: Filling defects demonstrated in the main and bilateral lobar pulmonary artery is extending into bilateral upper and lower lobe branches consistent with saddle embolus with moderately large clot burden. The RV to LV ratio is elevated at 1.19, suggesting risk of right heart strain. No pericardial effusion. Dilated ascending thoracic aorta at 3.5 cm. No dissection. Scattered calcification. Mediastinum/Nodes: Small esophageal hiatal hernia. Esophagus is decompressed. No significant lymphadenopathy in the chest. Lungs/Pleura: Evaluation is limited due to motion artifact. There patchy nodular ground-glass infiltrative changes demonstrated centrally in the lungs, in the lung bases, and scattered throughout the upper lungs. This may be due to multifocal pneumonia, edema, or infarcts. Metastatic disease would be a less likely consideration. No pleural effusions. No pneumothorax. Upper Abdomen: No acute abnormality. Musculoskeletal: Postoperative changes consistent with left mastectomy and surgical clips in the left axilla. No destructive bone lesions. Review of the MIP images confirms the above  findings. IMPRESSION: Positive examination for pulmonary embolus, including saddle embolus and bilateral lobar and segmental pulmonary emboli. Moderately large clot burden. Positive for acute PE with CT evidence of right heart strain (RV/LV Ratio = 1.19) consistent with at least submassive (intermediate risk) PE. The presence of right heart strain has been associated with an increased risk of morbidity and mortality. Please activate Code PE by paging [phone]. Patchy nodular ground-glass infiltrative changes in the lungs may be due to multifocal pneumonia, edema, or infarcts. Metastatic disease would be a less likely consideration but due to history of primary carcinoma,  non-contrast chest CT at 3-6 months is recommended. If nodules persist, subsequent management will be based upon the most suspicious nodule(s). This recommendation follows the consensus statement: Guidelines for Management of Incidental Pulmonary Nodules Detected on CT Images: From the Fleischner Society 2017; Radiology 2017; 284:228-243. These results were called by telephone at the time of interpretation on 04/24/2016 at 4:26 am to Dr. Deno Etienne , who verbally acknowledged these results. Electronically Signed   By: Lucienne Capers M.D.   On: 04/24/2016 04:32   Mr Brain Wo Contrast  Result Date: 04/24/2016 CLINICAL DATA:  Right-sided facial droop and difficulty speaking, symptoms for 1 week. History of breast cancer currently on chemotherapy. Large saddle embolus on CT chest. Query paradoxical emboli. History of diabetes. EXAM: MRI HEAD WITHOUT CONTRAST MRA HEAD WITHOUT CONTRAST TECHNIQUE: Multiplanar, multiecho pulse sequences of the brain and surrounding structures were obtained without intravenous contrast. Angiographic images of the head were obtained using MRA technique without contrast. COMPARISON:  CT chest 04/24/2016.  CT head 04/24/2016. FINDINGS: MRI HEAD FINDINGS Brain: No evidence for acute infarction, hemorrhage, mass lesion,  hydrocephalus, or extra-axial fluid. Moderate atrophy. Advanced T2 and FLAIR hyperintensities throughout the white matter, representing either diabetic related chronic microvascular ischemic change or post treatment effect from chemotherapy. Scattered areas of lacunar infarction in the deep nuclei and subcortical white matter. Brainstem and cerebellum spared. 9 mm of tonsillar descent below the foramen magnum consistent with Chiari I malformation. No visible hydromyelia. Partial empty sella. Vascular: Normal flow voids. Skull and upper cervical spine: Normal marrow signal. Large disc extrusion at C3-4, evaluated only on sagittal images, causing significant cord compression. Consider cervical spine MRI for further evaluation. Sinuses/Orbits: Negative. Other: None. MRA HEAD FINDINGS The internal carotid arteries are widely patent. Basilar artery is widely patent with vertebrals codominant. There is no intracranial stenosis or visible aneurysm. IMPRESSION: Moderate atrophy with advanced nonacute white matter signal abnormality. No visible acute stroke. Chiari I malformation. 9 mm tonsillar descent without visible hydromyelia. Within limits for assessment on noncontrast brain, no intracranial metastatic disease or visible osseous disease. No intracranial stenosis or occlusion. Electronically Signed   By: Staci Righter M.D.   On: 04/24/2016 13:34   Mr Jodene Nam Head/brain [id number] Cm  Result Date: 04/24/2016 CLINICAL DATA:  Right-sided facial droop and difficulty speaking, symptoms for 1 week. History of breast cancer currently on chemotherapy. Large saddle embolus on CT chest. Query paradoxical emboli. History of diabetes. EXAM: MRI HEAD WITHOUT CONTRAST MRA HEAD WITHOUT CONTRAST TECHNIQUE: Multiplanar, multiecho pulse sequences of the brain and surrounding structures were obtained without intravenous contrast. Angiographic images of the head were obtained using MRA technique without contrast. COMPARISON:  CT chest 04/24/2016.   CT head 04/24/2016. FINDINGS: MRI HEAD FINDINGS Brain: No evidence for acute infarction, hemorrhage, mass lesion, hydrocephalus, or extra-axial fluid. Moderate atrophy. Advanced T2 and FLAIR hyperintensities throughout the white matter, representing either diabetic related chronic microvascular ischemic change or post treatment effect from chemotherapy. Scattered areas of lacunar infarction in the deep nuclei and subcortical white matter. Brainstem and cerebellum spared. 9 mm of tonsillar descent below the foramen magnum consistent with Chiari I malformation. No visible hydromyelia. Partial empty sella. Vascular: Normal flow voids. Skull and upper cervical spine: Normal marrow signal. Large disc extrusion at C3-4, evaluated only on sagittal images, causing significant cord compression. Consider cervical spine MRI for further evaluation. Sinuses/Orbits: Negative. Other: None. MRA HEAD FINDINGS The internal carotid arteries are widely patent. Basilar artery is widely patent with vertebrals codominant. There  is no intracranial stenosis or visible aneurysm. IMPRESSION: Moderate atrophy with advanced nonacute white matter signal abnormality. No visible acute stroke. Chiari I malformation. 9 mm tonsillar descent without visible hydromyelia. Within limits for assessment on noncontrast brain, no intracranial metastatic disease or visible osseous disease. No intracranial stenosis or occlusion. Electronically Signed   By: Staci Righter M.D.   On: 04/24/2016 13:34     CBC  Recent Labs Lab 04/24/16 0007  04/25/16 0426 04/26/16 0439 04/27/16 0535 04/28/16 0537 04/28/16 1615 04/29/16 0646  WBC 8.7  --  11.2* 9.3 8.2 9.2  --  7.6  HGB 10.1*  < > 8.4* 8.2* 8.3* 7.7* 7.8* 7.8*  HCT 29.3*  < > 25.1* 24.7* 25.2* 23.6* 23.7* 24.1*  PLT 216  --  210 226 228 230  --  201  MCV 89.3  --  92.3 90.8 92.0 93.7  --  94.1  MCH 30.8  --  30.9 30.1 30.3 30.6  --  30.5  MCHC 34.5  --  33.5 33.2 32.9 32.6  --  32.4  RDW 15.5   --  16.6* 16.7* 16.9* 17.1*  --  17.4*  LYMPHSABS 1.4  --   --   --   --   --   --   --   MONOABS 1.0  --   --   --   --   --   --   --   EOSABS 0.1  --   --   --   --   --   --   --   BASOSABS 0.0  --   --   --   --   --   --   --   < > = values in this interval not displayed.  Chemistries   Recent Labs Lab 04/24/16 0007 04/24/16 0027 04/25/16 0426 04/28/16 0537 04/29/16 0646  NA 130* 130* 139  --  140  K 4.0 3.9 3.8  --  3.6  CL 100* 101 111  --  108  CO2 19*  --  19*  --  25  GLUCOSE 324* 334* 117*  --  93  BUN 14 14 5*  --  7  CREATININE 1.06* 0.90 0.85 0.81 0.78  CALCIUM 9.5  --  8.8*  --  8.8*  AST 30  --   --   --   --   ALT 22  --   --   --   --   ALKPHOS 83  --   --   --   --   BILITOT 0.6  --   --   --   --    ------------------------------------------------------------------------------------------------------------------ No results for input(s): CHOL, HDL, LDLCALC, TRIG, CHOLHDL, LDLDIRECT in the last 72 hours.  Lab Results  Component Value Date   HGBA1C 12.9 (H) 04/25/2016   ------------------------------------------------------------------------------------------------------------------ No results for input(s): TSH, T4TOTAL, T3FREE, THYROIDAB in the last 72 hours.  Invalid input(s): FREET3 ------------------------------------------------------------------------------------------------------------------ No results for input(s): VITAMINB12, FOLATE, FERRITIN, TIBC, IRON, RETICCTPCT in the last 72 hours.  Coagulation profile  Recent Labs Lab 04/24/16 0007  INR 1.21    No results for input(s): DDIMER in the last 72 hours.  Cardiac Enzymes No results for input(s): CKMB, TROPONINI, MYOGLOBIN in the last 168 hours.  Invalid input(s): CK ------------------------------------------------------------------------------------------------------------------ No results found for: BNP   Sharonne Ricketts M.D on 04/29/2016 at 6:14 PM  Between 7am to 7pm - Pager  - [phone]  After 7pm go to [email or web address] - password  [facility]  [facility] -  Office  [phone]  Dragon dictation system was used to create this note, attempts have been made to correct errors, however presence of uncorrected errors is not a reflection quality of care provided

## 2016-04-30 DIAGNOSIS — E1122 Type 2 diabetes mellitus with diabetic chronic kidney disease: Secondary | ICD-10-CM | POA: Diagnosis not present

## 2016-04-30 DIAGNOSIS — I639 Cerebral infarction, unspecified: Secondary | ICD-10-CM | POA: Diagnosis not present

## 2016-04-30 DIAGNOSIS — I509 Heart failure, unspecified: Secondary | ICD-10-CM | POA: Diagnosis not present

## 2016-04-30 DIAGNOSIS — I2602 Saddle embolus of pulmonary artery with acute cor pulmonale: Secondary | ICD-10-CM | POA: Diagnosis not present

## 2016-04-30 DIAGNOSIS — E871 Hypo-osmolality and hyponatremia: Secondary | ICD-10-CM | POA: Diagnosis not present

## 2016-04-30 DIAGNOSIS — I130 Hypertensive heart and chronic kidney disease with heart failure and stage 1 through stage 4 chronic kidney disease, or unspecified chronic kidney disease: Secondary | ICD-10-CM | POA: Diagnosis not present

## 2016-04-30 DIAGNOSIS — I82401 Acute embolism and thrombosis of unspecified deep veins of right lower extremity: Secondary | ICD-10-CM | POA: Diagnosis not present

## 2016-04-30 DIAGNOSIS — I2692 Saddle embolus of pulmonary artery without acute cor pulmonale: Secondary | ICD-10-CM | POA: Diagnosis not present

## 2016-04-30 LAB — BASIC METABOLIC PANEL
ANION GAP: 8 (ref 5–15)
BUN: 5 mg/dL — ABNORMAL LOW (ref 6–20)
CALCIUM: 8.7 mg/dL — AB (ref 8.9–10.3)
CHLORIDE: 108 mmol/L (ref 101–111)
CO2: 24 mmol/L (ref 22–32)
Creatinine, Ser: 0.76 mg/dL (ref 0.44–1.00)
GFR calc non Af Amer: >60 mL/min (ref >60)
GLUCOSE: 79 mg/dL (ref 65–99)
POTASSIUM: 3.8 mmol/L (ref 3.5–5.1)
Sodium: 140 mmol/L (ref 135–145)

## 2016-04-30 LAB — GLUCOSE, CAPILLARY
GLUCOSE-CAPILLARY: 121 mg/dL — AB (ref 65–99)
GLUCOSE-CAPILLARY: 138 mg/dL — AB (ref 65–99)
GLUCOSE-CAPILLARY: 139 mg/dL — AB (ref 65–99)
GLUCOSE-CAPILLARY: 185 mg/dL — AB (ref 65–99)
GLUCOSE-CAPILLARY: 90 mg/dL (ref 65–99)
GLUCOSE-CAPILLARY: 97 mg/dL (ref 65–99)
Glucose-Capillary: 133 mg/dL — ABNORMAL HIGH (ref 65–99)

## 2016-04-30 LAB — CBC
HEMATOCRIT: 23.9 % — AB (ref 36.0–46.0)
HEMOGLOBIN: 7.7 g/dL — AB (ref 12.0–15.0)
MCH: 30.6 pg (ref 26.0–34.0)
MCHC: 32.2 g/dL (ref 30.0–36.0)
MCV: 94.8 fL (ref 78.0–100.0)
Platelets: 205 10*3/uL (ref 150–400)
RBC: 2.52 MIL/uL — ABNORMAL LOW (ref 3.87–5.11)
RDW: 17.6 % — AB (ref 11.5–15.5)
WBC: 8.2 10*3/uL (ref 4.0–10.5)

## 2016-04-30 LAB — PROTIME-INR
INR: 1.2
Prothrombin Time: 15.2 seconds (ref 11.4–15.2)

## 2016-04-30 NOTE — Progress Notes (Signed)
ANTICOAGULATION CONSULT NOTE - follow up  Pharmacy Consult for Lovenox >> Coumadin Indication: pulmonary embolus /DVT RLE  Allergies  Allergen Reactions  . Dust Mite Extract     Patient Measurements: Height: 5\' 3"  (160 cm) Weight: 174 lb 6.4 oz (79.1 kg) IBW/kg (Calculated) : 52.4  Addendum: re-weighed 04/27/16 = 79.1 kg    Vital Signs: Temp: 98.5 F (36.9 C) (01/20 1039) Temp Source: Oral (01/20 0446) BP: 109/64 (01/20 1039) Pulse Rate: 105 (01/20 1039)  Labs:  Recent Labs  04/28/16 0537 04/28/16 1615 04/29/16 0646 04/30/16 0351  HGB 7.7* 7.8* 7.8* 7.7*  HCT 23.6* 23.7* 24.1* 23.9*  PLT 230  --  201 205  LABPROT  --   --   --  15.2  INR  --   --   --  1.20  CREATININE 0.81  --  0.78 0.76    Estimated Creatinine Clearance: 68 mL/min (by C-G formula based on SCr of 0.76 mg/dL).   Medical History: Past Medical History:  Diagnosis Date  . Cancer (HCC)    breast  . CHF (congestive heart failure) ([place])   . Diabetes mellitus without complication ([place])   . Renal disorder     Assessment: 66yo female with new/acute saddle pulmonary embolus in setting of breast cancer. Initial plan was to transition from lovenox to NOAC but due to insurance issues decision made to transition to coumadin that was started on 1/19. INR today is 1.2 and hgb low but stable.    Goal of Therapy:  Anti-Xa level 0.6-1 units/ml 4hrs after LMWH dose given Monitor platelets by anticoagulation protocol: Yes  INR: 2 to 3    Plan:  1. Continue Lovenox 80 mg sq Q 12 hours 2. Continue Coumadin 5 mg po daily at 1800 pm 3. Daily INR 4. Coumadin education  Vincenza Hews, PharmD, BCPS 04/30/2016, 11:03 AM

## 2016-04-30 NOTE — Progress Notes (Signed)
Pt's daughter asking to speak with the MD. MD made aware.

## 2016-04-30 NOTE — Progress Notes (Signed)
Pt's HR reached 158 while ambulating with PT. Pt has some SOB, but no other complaints. MD made aware. Will continue to monitor.

## 2016-04-30 NOTE — Progress Notes (Signed)
Patient Demographics:    Carrie Watts, is a 66 y.o. female, DOB - 28-Sep-1950, [id number]  Admit date - 04/24/2016   Admitting Physician Etta Quill, DO  Outpatient Primary MD for the patient is No primary care provider on file.  LOS - 6   Chief Complaint  Patient presents with  . difficulty speaking        Subjective:    Carrie Watts today has no fevers, no emesis,  No chest pain,  DOE,    Assessment  & Plan :    Principal Problem:   Saddle pulmonary embolus (HCC) Active Problems:   DM2 (diabetes mellitus, type 2) (HCC)   Stroke-like symptoms   Acute saddle pulmonary embolism with acute cor pulmonale (HCC)   History of cancer of left breast   Pressure injury of skin   Acute deep vein thrombosis (DVT) of right lower extremity (HCC)   Hyperlipidemia  Brief Narrative  66 year old female with history of diabetes mellitus, left breast cancer status post lumpectomy who recently flew back from [place] presented with right-sided facial droop with difficulty speaking, chest discomfort, cough and shortness of breath. These symptoms were going on for few days. Workup done in the ED showed a saddle pulmonary emboli. Admitted to hospitalist service on anticoagulation. PCCM consulted. Neurology consulted for strokelike symptoms.  Pt was seen at vascular lab. Her TCD Bubble study negative for PFO. However, She was found to have right LE DVT. She still on Lovenox therapeutic dose   Carrie Watts a 66 y.o.femalewith history of breast CA, CHF, CKD, and DMpresenting with one week history of right sided weakness and speech difficulties.. Shedidnot receive IV t-PA due to late presentation.  1)PE with saddle embolus and right heart strain/Right LE DVT - ? related to long flight travel vs. Hypercoagulable state secondary to breast cancer- continue therapeutic Lovenox, TCD bubble study no  PFO. Hemoptysis persist, pt and her daughter are considering coumadin due to cost, patient has no insurance, can only get Xarelto or Eliquis for 1 month, given history of underlying malignancy with hypercoagulable state Lovenox and Coumadin will be preferred over NOA. C/n Coumadin therapy (started on 04/29/16)  to overlap with Lovenox  2)Stuttering speech- resolved speech concerns, MRI and MRA of the brain without acute stroke findings, patient has old basal ganglia infarct, echo with EF of 55-60% without PFO on bubble study, LDL is 127, carotid Dopplers without acute findings, continue aspirin and Lipitor  3)Chiari malformation- 9 mm tonsillar descent without visible hydromyelia, asymptomatic, outpatient neurosurgical consult advised  4)Hypertension- stable at this time  5)Diabetes- A1c is over 12,  C/n Lantus insulin 10 units daily at bedtime and Use Novolog/Humalog Sliding scale insulin with Accu-Cheks/Fingersticks as ordered, c/n  glipizide as patient has no insurance  6)Breast cancer - s/p mastectomy and undergoing chemotherapy, To rule out metastatic disease on chest CT - F/U non contrast chest CT in 3 - 6 months.  7)Social- discharge plan discussed with patient and patient's daughter Guy Begin who is an Therapist, sports, given lack of insurance and cost concerns in the post discharge environment Coumadin therapy  Has been initiated  Code Status:Full   Disposition Plan:to be determined   Consults :Neurology signed off  DVT Prophylaxis: Lovenox  Lab Results  Component Value Date   PLT 205 04/30/2016   Inpatient Medications  Scheduled Meds: .  stroke: mapping our early stages of recovery book   Does not apply Once  . atorvastatin  20 mg Oral QHS  . coumadin book   Does not apply Once  . enoxaparin (LOVENOX) injection  80 mg Subcutaneous Q12H  . guaiFENesin  600 mg Oral BID  . insulin aspart  0-9 Units Subcutaneous Q4H  . insulin glargine  10 Units Subcutaneous QHS  .  polyethylene glycol  17 g Oral Daily  . warfarin  5 mg Oral q1800  . warfarin   Does not apply Once  . Warfarin - Pharmacist Dosing Inpatient   Does not apply q1800   Continuous Infusions: PRN Meds:.acetaminophen **OR** acetaminophen (TYLENOL) oral liquid 160 mg/5 mL **OR** acetaminophen, alum & mag hydroxide-simeth, docusate sodium, guaiFENesin-dextromethorphan, ondansetron (ZOFRAN) IV    Anti-infectives    Start     Dose/Rate Route Frequency Ordered Stop   04/24/16 0445  vancomycin (VANCOCIN) 1,500 mg in sodium chloride 0.9 % 500 mL IVPB     1,500 mg 250 mL/hr over 120 Minutes Intravenous  Once 04/24/16 0440 04/24/16 0659   04/24/16 0445  piperacillin-tazobactam (ZOSYN) IVPB 3.375 g     3.375 g 100 mL/hr over 30 Minutes Intravenous  Once 04/24/16 0440 04/24/16 0526        Objective:   Vitals:   04/30/16 0446 04/30/16 0839 04/30/16 1039 04/30/16 1512  BP: 117/76  109/64 105/71  Pulse: (!) 115  (!) 105 (!) 107  Resp: 18  (!) 22 20  Temp: 99.9 F (37.7 C)  98.5 F (36.9 C) 99.3 F (37.4 C)  TempSrc: Oral   Oral  SpO2: 100% 95% 94% 92%  Weight:      Height:        Wt Readings from Last 3 Encounters:  04/27/16 79.1 kg (174 lb 6.4 oz)     Intake/Output Summary (Last 24 hours) at 04/30/16 1651 Last data filed at 04/30/16 1246  Gross per 24 hour  Intake                0 ml  Output             1300 ml  Net            -1300 ml     Physical Exam  Gen:- Awake Alert,  In no apparent distress  HEENT:- [place].AT, No sclera icterus Neck-Supple Neck,No JVD,.  Lungs-  CTAB  CV- S1, S2 normal Abd-  +ve B.Sounds, Abd Soft, No tenderness,    Extremity/Skin:- warm and dry    Data Review:   Micro Results Recent Results (from the past 240 hour(s))  MRSA PCR Screening     Status: None   Collection Time: 04/24/16  6:31 AM  Result Value Ref Range Status   MRSA by PCR NEGATIVE NEGATIVE Final    Comment:        The GeneXpert MRSA Assay (FDA approved for NASAL  specimens only), is one component of a comprehensive MRSA colonization surveillance program. It is not intended to diagnose MRSA infection nor to guide or monitor treatment for MRSA infections.     Radiology Reports Dg Chest 2 View  Result Date: 04/24/2016 CLINICAL DATA:  Cough. History of hypertension and CHF. History of breast cancer. Patient has had loss of function on the right side over body and has difficulty speaking. EXAM: CHEST  2 VIEW COMPARISON:  None. FINDINGS:  Normal heart size and pulmonary vascularity. No focal airspace disease or consolidation in the lungs. No blunting of costophrenic angles. No pneumothorax. Mediastinal contours appear intact. Surgical clips in the left axilla. Degenerative changes in the spine. IMPRESSION: No active cardiopulmonary disease. Electronically Signed   By: Lucienne Capers M.D.   On: 04/24/2016 01:33   Ct Head Wo Contrast  Result Date: 04/24/2016 CLINICAL DATA:  Acute onset of slurred speech and bilateral leg weakness. Initial encounter. EXAM: CT HEAD WITHOUT CONTRAST TECHNIQUE: Contiguous axial images were obtained from the base of the skull through the vertex without intravenous contrast. COMPARISON:  None. FINDINGS: Brain: No evidence of acute infarction, hemorrhage, hydrocephalus, extra-axial collection or mass lesion/mass effect. Prominence of the ventricles and sulci reflects mild cortical volume loss. Scattered periventricular subcortical white matter change likely reflects small vessel ischemic microangiopathy. A small chronic lacunar infarct is noted at the right basal ganglia. The brainstem and fourth ventricle are within normal limits. The cerebral hemispheres demonstrate grossly normal gray-white differentiation. No mass effect or midline shift is seen. Vascular: No hyperdense vessel or unexpected calcification. Skull: There is no evidence of fracture; visualized osseous structures are unremarkable in appearance. Sinuses/Orbits: The orbits  are within normal limits. The paranasal sinuses and mastoid air cells are well-aerated. Other: No significant soft tissue abnormalities are seen. IMPRESSION: 1. No acute intracranial pathology seen on CT. 2. Mild cortical volume loss and scattered small vessel ischemic microangiopathy. 3. Small chronic lacunar infarct at the right basal ganglia. Electronically Signed   By: Garald Balding M.D.   On: 04/24/2016 01:54   Ct Angio Chest Pe W And/or Wo Contrast  Result Date: 04/24/2016 CLINICAL DATA:  Cough, chest pain, and shortness of breath for 2 weeks. History of breast cancer post left mastectomy. Chemotherapy. Multiple recent strokes. Diabetes. EXAM: CT ANGIOGRAPHY CHEST WITH CONTRAST TECHNIQUE: Multidetector CT imaging of the chest was performed using the standard protocol during bolus administration of intravenous contrast. Multiplanar CT image reconstructions and MIPs were obtained to evaluate the vascular anatomy. CONTRAST:  100 mL Isovue 370 COMPARISON:  None. FINDINGS: Cardiovascular: Filling defects demonstrated in the main and bilateral lobar pulmonary artery is extending into bilateral upper and lower lobe branches consistent with saddle embolus with moderately large clot burden. The RV to LV ratio is elevated at 1.19, suggesting risk of right heart strain. No pericardial effusion. Dilated ascending thoracic aorta at 3.5 cm. No dissection. Scattered calcification. Mediastinum/Nodes: Small esophageal hiatal hernia. Esophagus is decompressed. No significant lymphadenopathy in the chest. Lungs/Pleura: Evaluation is limited due to motion artifact. There patchy nodular ground-glass infiltrative changes demonstrated centrally in the lungs, in the lung bases, and scattered throughout the upper lungs. This may be due to multifocal pneumonia, edema, or infarcts. Metastatic disease would be a less likely consideration. No pleural effusions. No pneumothorax. Upper Abdomen: No acute abnormality. Musculoskeletal:  Postoperative changes consistent with left mastectomy and surgical clips in the left axilla. No destructive bone lesions. Review of the MIP images confirms the above findings. IMPRESSION: Positive examination for pulmonary embolus, including saddle embolus and bilateral lobar and segmental pulmonary emboli. Moderately large clot burden. Positive for acute PE with CT evidence of right heart strain (RV/LV Ratio = 1.19) consistent with at least submassive (intermediate risk) PE. The presence of right heart strain has been associated with an increased risk of morbidity and mortality. Please activate Code PE by paging [phone]. Patchy nodular ground-glass infiltrative changes in the lungs may be due to multifocal pneumonia, edema, or infarcts.  Metastatic disease would be a less likely consideration but due to history of primary carcinoma, non-contrast chest CT at 3-6 months is recommended. If nodules persist, subsequent management will be based upon the most suspicious nodule(s). This recommendation follows the consensus statement: Guidelines for Management of Incidental Pulmonary Nodules Detected on CT Images: From the Fleischner Society 2017; Radiology 2017; 284:228-243. These results were called by telephone at the time of interpretation on 04/24/2016 at 4:26 am to Dr. Deno Etienne , who verbally acknowledged these results. Electronically Signed   By: Lucienne Capers M.D.   On: 04/24/2016 04:32   Mr Brain Wo Contrast  Result Date: 04/24/2016 CLINICAL DATA:  Right-sided facial droop and difficulty speaking, symptoms for 1 week. History of breast cancer currently on chemotherapy. Large saddle embolus on CT chest. Query paradoxical emboli. History of diabetes. EXAM: MRI HEAD WITHOUT CONTRAST MRA HEAD WITHOUT CONTRAST TECHNIQUE: Multiplanar, multiecho pulse sequences of the brain and surrounding structures were obtained without intravenous contrast. Angiographic images of the head were obtained using MRA technique  without contrast. COMPARISON:  CT chest 04/24/2016.  CT head 04/24/2016. FINDINGS: MRI HEAD FINDINGS Brain: No evidence for acute infarction, hemorrhage, mass lesion, hydrocephalus, or extra-axial fluid. Moderate atrophy. Advanced T2 and FLAIR hyperintensities throughout the white matter, representing either diabetic related chronic microvascular ischemic change or post treatment effect from chemotherapy. Scattered areas of lacunar infarction in the deep nuclei and subcortical white matter. Brainstem and cerebellum spared. 9 mm of tonsillar descent below the foramen magnum consistent with Chiari I malformation. No visible hydromyelia. Partial empty sella. Vascular: Normal flow voids. Skull and upper cervical spine: Normal marrow signal. Large disc extrusion at C3-4, evaluated only on sagittal images, causing significant cord compression. Consider cervical spine MRI for further evaluation. Sinuses/Orbits: Negative. Other: None. MRA HEAD FINDINGS The internal carotid arteries are widely patent. Basilar artery is widely patent with vertebrals codominant. There is no intracranial stenosis or visible aneurysm. IMPRESSION: Moderate atrophy with advanced nonacute white matter signal abnormality. No visible acute stroke. Chiari I malformation. 9 mm tonsillar descent without visible hydromyelia. Within limits for assessment on noncontrast brain, no intracranial metastatic disease or visible osseous disease. No intracranial stenosis or occlusion. Electronically Signed   By: Staci Righter M.D.   On: 04/24/2016 13:34   Mr Jodene Nam Head/brain [id number] Cm  Result Date: 04/24/2016 CLINICAL DATA:  Right-sided facial droop and difficulty speaking, symptoms for 1 week. History of breast cancer currently on chemotherapy. Large saddle embolus on CT chest. Query paradoxical emboli. History of diabetes. EXAM: MRI HEAD WITHOUT CONTRAST MRA HEAD WITHOUT CONTRAST TECHNIQUE: Multiplanar, multiecho pulse sequences of the brain and surrounding  structures were obtained without intravenous contrast. Angiographic images of the head were obtained using MRA technique without contrast. COMPARISON:  CT chest 04/24/2016.  CT head 04/24/2016. FINDINGS: MRI HEAD FINDINGS Brain: No evidence for acute infarction, hemorrhage, mass lesion, hydrocephalus, or extra-axial fluid. Moderate atrophy. Advanced T2 and FLAIR hyperintensities throughout the white matter, representing either diabetic related chronic microvascular ischemic change or post treatment effect from chemotherapy. Scattered areas of lacunar infarction in the deep nuclei and subcortical white matter. Brainstem and cerebellum spared. 9 mm of tonsillar descent below the foramen magnum consistent with Chiari I malformation. No visible hydromyelia. Partial empty sella. Vascular: Normal flow voids. Skull and upper cervical spine: Normal marrow signal. Large disc extrusion at C3-4, evaluated only on sagittal images, causing significant cord compression. Consider cervical spine MRI for further evaluation. Sinuses/Orbits: Negative. Other: None. MRA HEAD FINDINGS The  internal carotid arteries are widely patent. Basilar artery is widely patent with vertebrals codominant. There is no intracranial stenosis or visible aneurysm. IMPRESSION: Moderate atrophy with advanced nonacute white matter signal abnormality. No visible acute stroke. Chiari I malformation. 9 mm tonsillar descent without visible hydromyelia. Within limits for assessment on noncontrast brain, no intracranial metastatic disease or visible osseous disease. No intracranial stenosis or occlusion. Electronically Signed   By: Staci Righter M.D.   On: 04/24/2016 13:34     CBC  Recent Labs Lab 04/24/16 0007  04/26/16 0439 04/27/16 0535 04/28/16 0537 04/28/16 1615 04/29/16 0646 04/30/16 0351  WBC 8.7  < > 9.3 8.2 9.2  --  7.6 8.2  HGB 10.1*  < > 8.2* 8.3* 7.7* 7.8* 7.8* 7.7*  HCT 29.3*  < > 24.7* 25.2* 23.6* 23.7* 24.1* 23.9*  PLT 216  < > 226  228 230  --  201 205  MCV 89.3  < > 90.8 92.0 93.7  --  94.1 94.8  MCH 30.8  < > 30.1 30.3 30.6  --  30.5 30.6  MCHC 34.5  < > 33.2 32.9 32.6  --  32.4 32.2  RDW 15.5  < > 16.7* 16.9* 17.1*  --  17.4* 17.6*  LYMPHSABS 1.4  --   --   --   --   --   --   --   MONOABS 1.0  --   --   --   --   --   --   --   EOSABS 0.1  --   --   --   --   --   --   --   BASOSABS 0.0  --   --   --   --   --   --   --   < > = values in this interval not displayed.  Chemistries   Recent Labs Lab 04/24/16 0007 04/24/16 0027 04/25/16 0426 04/28/16 0537 04/29/16 0646 04/30/16 0351  NA 130* 130* 139  --  140 140  K 4.0 3.9 3.8  --  3.6 3.8  CL 100* 101 111  --  108 108  CO2 19*  --  19*  --  25 24  GLUCOSE 324* 334* 117*  --  93 79  BUN 14 14 5*  --  7 5*  CREATININE 1.06* 0.90 0.85 0.81 0.78 0.76  CALCIUM 9.5  --  8.8*  --  8.8* 8.7*  AST 30  --   --   --   --   --   ALT 22  --   --   --   --   --   ALKPHOS 83  --   --   --   --   --   BILITOT 0.6  --   --   --   --   --    ------------------------------------------------------------------------------------------------------------------ No results for input(s): CHOL, HDL, LDLCALC, TRIG, CHOLHDL, LDLDIRECT in the last 72 hours.  Lab Results  Component Value Date   HGBA1C 12.9 (H) 04/25/2016   ------------------------------------------------------------------------------------------------------------------ No results for input(s): TSH, T4TOTAL, T3FREE, THYROIDAB in the last 72 hours.  Invalid input(s): FREET3 ------------------------------------------------------------------------------------------------------------------ No results for input(s): VITAMINB12, FOLATE, FERRITIN, TIBC, IRON, RETICCTPCT in the last 72 hours.  Coagulation profile  Recent Labs Lab 04/24/16 0007 04/30/16 0351  INR 1.21 1.20    No results for input(s): DDIMER in the last 72 hours.  Cardiac Enzymes No results for input(s): CKMB, TROPONINI, MYOGLOBIN in the last  168 hours.  Invalid input(s): CK ------------------------------------------------------------------------------------------------------------------ No results found for: BNP   Alexis Mizuno M.D on 04/30/2016 at 4:51 PM  Between 7am to 7pm - Pager - [phone]  After 7pm go to [email or web address] - password [email or web address]  [facility] -  Office  [phone]  Dragon dictation system was used to create this note, attempts have been made to correct errors, however presence of uncorrected errors is not a reflection quality of care provided

## 2016-04-30 NOTE — Progress Notes (Signed)
Occupational Therapy Treatment Patient Details Name: Carrie Watts MRN: [id number] DOB: 03-Apr-1951 Today's Date: 04/30/2016    History of present illness 66 y/o female came to the [facility] ED on 1/14 with cough, dyspnea and chest pain in the setting of two weeks of R sided facial droop and slurred speech after returning from [place] on 1/13. Noted to have a large PE on CT angiogram. MRI and MRA negative.   OT comments  Pt. Making gains with skilled OT.  Focus of today's session was introduction of theraputty and theraband for increasing endurance and UB strengthing.  Reviewed exercises.  Pt. Able to return demo but fatigued quickly and required cues for rest breaks.  "im tired of resting so much".  Provided emotional support as pt. Was visibly upset that she was SOB and tired.  Will continue to follow acutely.    Follow Up Recommendations  SNF;Supervision/Assistance - 24 hour    Equipment Recommendations       Recommendations for Other Services      Precautions / Restrictions Precautions Precaution Comments: SOB with activity Restrictions Weight Bearing Restrictions: No       Mobility Bed Mobility               General bed mobility comments: seated in recliner at beginning of session, and seated on bsc at end of session  Transfers Overall transfer level: Needs assistance Equipment used: Rolling walker (2 wheeled) Transfers: Stand Pivot Transfers;Sit to/from Stand Sit to Stand: Min guard Stand pivot transfers: Min guard       General transfer comment: Cues for hand placement to push from seated surface, min guard for safety    Balance                                   ADL                                                Vision                     Perception     Praxis      Cognition   Behavior During Therapy: [facility] for tasks assessed/performed;Anxious Overall Cognitive Status: Within Functional Limits for tasks  assessed                       Extremity/Trunk Assessment               Exercises Other Exercises Other Exercises: soft theraputty and theraband provided and provided demo.  pt. able to return demo but became SOB during theraputty task (rolling and pinching).  educated on energy conservation and need for rest breaks.  also reviewed handouts provided.  encouraged completion of ue strengthening throughout the day   Shoulder Instructions       General Comments  reports a dtr. Coming in from [place], (cna)  and another from [place]. Licensed conveyancer)  Will have all 3 dtrs. Here with her For a little while.  (3rd dtr. Is a Therapist, sports here).      Pertinent Vitals/ Pain       Pain Assessment: No/denies pain  Home Living  Prior Functioning/Environment              Frequency  Min 2X/week        Progress Toward Goals  OT Goals(current goals can now be found in the care plan section)  Progress towards OT goals: Progressing toward goals     Plan Discharge plan remains appropriate    Co-evaluation                 End of Session Equipment Utilized During Treatment: Gait belt;Rolling walker   Activity Tolerance Patient tolerated treatment well   Patient Left in chair;with call bell/phone within reach   Nurse Communication Other (comment) (alerted CNA pt. was on bsc with call bell within reach)        Time: [id number] OT Time Calculation (min): 23 min  Charges: OT General Charges $OT Visit: 1 Procedure OT Treatments $Therapeutic Exercise: 23-37 mins  Janice Coffin, COTA/L 04/30/2016, 12:27 PM

## 2016-04-30 NOTE — Progress Notes (Signed)
Physical Therapy Treatment Patient Details Name: Carrie Watts MRN: [id number] DOB: 19-Aug-1950 Today's Date: 04/30/2016    History of Present Illness 65 y/o female came to the [facility] ED on 1/14 with cough, dyspnea and chest pain in the setting of two weeks of R sided facial droop and slurred speech after returning from [place] on 1/13. Noted to have a large PE on CT angiogram. MRI and MRA negative.    PT Comments    Pt able to ambulate further today but tx still limited by fatigue. Pt required multiple standing rest breaks with instructional pursed lip breathing to increase 02 sats and decrease HR during activity. Pt experienced episode of tachycardia up to 160 bpm while pivoting to sit on EOB. Left pt sitting on EOB with daughter and nurse. Plan to continue monitoring vitals during ambulation with progression to LE strengthening exercises when pt can tolerate. PT recommending d/c to SNF in order to progress pt to become more functionally independent.    Follow Up Recommendations  SNF;Supervision/Assistance - 24 hour     Equipment Recommendations  None recommended by PT    Recommendations for Other Services       Precautions / Restrictions Precautions Precautions: Fall Precaution Comments: SOB and tachycardia with activity Restrictions Weight Bearing Restrictions: No    Mobility  Bed Mobility               General bed mobility comments: pt seated at EOB upon arrival  Transfers Overall transfer level: Needs assistance Equipment used: Ambulation equipment used Transfers: Sit to/from Stand Sit to Stand: Min guard Stand pivot transfers: Min guard       General transfer comment: Cues for hand placement to push from seated surface, min guard for safety   Ambulation/Gait Ambulation/Gait assistance: Min guard Ambulation Distance (Feet): 100 Feet Assistive device: Rolling walker (2 wheeled) Gait Pattern/deviations: Shuffle;Decreased stride length;Step-through  pattern Gait velocity: slow; labored breathing   General Gait Details: pt able to ambulate without need of 02; sats ranging from 90-97% while ambulating. Required v/c to pick feet up to prevent shuffling.   Stairs            Wheelchair Mobility    Modified Rankin (Stroke Patients Only)       Balance Overall balance assessment: Needs assistance Sitting-balance support: Feet unsupported;Bilateral upper extremity supported Sitting balance-Leahy Scale: Fair     Standing balance support: Bilateral upper extremity supported;During functional activity   Standing balance comment: needs UE support for safe static and dynamic standing activity.                    Cognition Arousal/Alertness: Awake/alert Behavior During Therapy: WFL for tasks assessed/performed Overall Cognitive Status: Within Functional Limits for tasks assessed                 General Comments: Language barrier but does speak english.      Exercises Other Exercises Other Exercises: soft theraputty and theraband provided and provided demo.  pt. able to return demo but became SOB during theraputty task (rolling and pinching).  educated on energy conservation and need for rest breaks.  also reviewed handouts provided.  encouraged completion of ue strengthening throughout the day    General Comments        Pertinent Vitals/Pain Pain Assessment: No/denies pain    Home Living                      Prior Function  PT Goals (current goals can now be found in the care plan section) Acute Rehab PT Goals Patient Stated Goal: to be able to walk more PT Goal Formulation: With patient Potential to Achieve Goals: Good Progress towards PT goals: Progressing toward goals    Frequency    Min 3X/week      PT Plan Current plan remains appropriate    Co-evaluation             End of Session Equipment Utilized During Treatment: Gait belt Activity Tolerance: Patient  tolerated treatment well;Patient limited by lethargy Patient left: with call bell/phone within reach;in bed;with family/visitor present     Time: 1431-1447 PT Time Calculation (min) (ACUTE ONLY): 16 min  Charges:  $Gait Training: 8-22 mins                    G Codes:      [facility] May 24, 2016, 3:08 PM  [place], [place] Pager [phone]

## 2016-05-01 DIAGNOSIS — I2602 Saddle embolus of pulmonary artery with acute cor pulmonale: Secondary | ICD-10-CM | POA: Diagnosis not present

## 2016-05-01 DIAGNOSIS — I130 Hypertensive heart and chronic kidney disease with heart failure and stage 1 through stage 4 chronic kidney disease, or unspecified chronic kidney disease: Secondary | ICD-10-CM | POA: Diagnosis not present

## 2016-05-01 DIAGNOSIS — I82401 Acute embolism and thrombosis of unspecified deep veins of right lower extremity: Secondary | ICD-10-CM | POA: Diagnosis not present

## 2016-05-01 DIAGNOSIS — I2692 Saddle embolus of pulmonary artery without acute cor pulmonale: Secondary | ICD-10-CM | POA: Diagnosis not present

## 2016-05-01 DIAGNOSIS — E1122 Type 2 diabetes mellitus with diabetic chronic kidney disease: Secondary | ICD-10-CM | POA: Diagnosis not present

## 2016-05-01 DIAGNOSIS — I639 Cerebral infarction, unspecified: Secondary | ICD-10-CM | POA: Diagnosis not present

## 2016-05-01 DIAGNOSIS — E871 Hypo-osmolality and hyponatremia: Secondary | ICD-10-CM | POA: Diagnosis not present

## 2016-05-01 DIAGNOSIS — I509 Heart failure, unspecified: Secondary | ICD-10-CM | POA: Diagnosis not present

## 2016-05-01 LAB — CBC
HEMATOCRIT: 23.9 % — AB (ref 36.0–46.0)
HEMOGLOBIN: 7.6 g/dL — AB (ref 12.0–15.0)
MCH: 30.3 pg (ref 26.0–34.0)
MCHC: 31.8 g/dL (ref 30.0–36.0)
MCV: 95.2 fL (ref 78.0–100.0)
Platelets: 213 10*3/uL (ref 150–400)
RBC: 2.51 MIL/uL — AB (ref 3.87–5.11)
RDW: 17.6 % — ABNORMAL HIGH (ref 11.5–15.5)
WBC: 8.3 10*3/uL (ref 4.0–10.5)

## 2016-05-01 LAB — GLUCOSE, CAPILLARY
GLUCOSE-CAPILLARY: 91 mg/dL (ref 65–99)
GLUCOSE-CAPILLARY: 97 mg/dL (ref 65–99)
GLUCOSE-CAPILLARY: 112 mg/dL — AB (ref 65–99)
GLUCOSE-CAPILLARY: 94 mg/dL (ref 65–99)
Glucose-Capillary: 104 mg/dL — ABNORMAL HIGH (ref 65–99)
Glucose-Capillary: 139 mg/dL — ABNORMAL HIGH (ref 65–99)

## 2016-05-01 LAB — PROTIME-INR
INR: 1.24
Prothrombin Time: 15.7 seconds — ABNORMAL HIGH (ref 11.4–15.2)

## 2016-05-01 MED ORDER — GLIPIZIDE 5 MG PO TABS
5.0000 mg | ORAL_TABLET | Freq: Two times a day (BID) | ORAL | Status: DC
  Administered 2016-05-01 – 2016-05-03 (×5): 5 mg via ORAL
  Filled 2016-05-01 (×5): qty 1

## 2016-05-01 MED ORDER — WARFARIN SODIUM 7.5 MG PO TABS
7.5000 mg | ORAL_TABLET | Freq: Every day | ORAL | Status: DC
  Administered 2016-05-01: 7.5 mg via ORAL
  Filled 2016-05-01: qty 1

## 2016-05-01 MED ORDER — METFORMIN HCL 500 MG PO TABS
1000.0000 mg | ORAL_TABLET | Freq: Two times a day (BID) | ORAL | Status: DC
  Administered 2016-05-01 – 2016-05-03 (×5): 1000 mg via ORAL
  Filled 2016-05-01 (×5): qty 2

## 2016-05-01 NOTE — Clinical Social Work Note (Signed)
CSW received call from unit secretary stating that the daughter "needs to urgently speak to social work." CSW spoke with daughter by phone. Daughter states that she needs CSW to establish emergency Medicaid for the patient. Please note that the patient's daughter has already been given information to local Medicaid office to apply for this. CSW has left message for financial counselor as well to followup with the daughter. Daughter is concerned about the patient getting her medications post DC. CSW signing off.   Liz Beach MSW, [place], [place], [id number]

## 2016-05-01 NOTE — Progress Notes (Signed)
Patient Demographics:    Carrie Watts, is a 66 y.o. female, DOB - 08-Jun-1950, [id number]  Admit date - 04/24/2016   Admitting Physician Etta Quill, DO  Outpatient Primary MD for the patient is No primary care provider on file.  LOS - 7   Chief Complaint  Patient presents with  . difficulty speaking        Subjective:    Carrie Watts today has no fevers, no emesis,  No chest pain,  No shortness of breath at rest, dyspnea on exertion persist,   Assessment  & Plan :    Principal Problem:   Saddle pulmonary embolus (HCC) Active Problems:   DM2 (diabetes mellitus, type 2) (HCC)   Stroke-like symptoms   Acute saddle pulmonary embolism with acute cor pulmonale (HCC)   History of cancer of left breast   Pressure injury of skin   Acute deep vein thrombosis (DVT) of right lower extremity ([place])   Hyperlipidemia  Brief Narrative  66 year old female with history of diabetes mellitus, left breast cancer status post lumpectomy who recently flew back from [place] presented with right-sided facial droop with difficulty speaking, chest discomfort, cough and shortness of breath. These symptoms were going on for few days. Workup done in the ED showed a saddle pulmonary emboli. Admitted to hospitalist service on anticoagulation. PCCM consulted. Neurology consulted for strokelike symptoms.  Pt was seen at vascular lab. Her TCD Bubble study negative for PFO. However, She was found to have right LE DVT. She still on Lovenox therapeutic dose  Ms.Carrie Watts a 66 y.o.femalewith history of breast CA, CHF, CKD, and DMpresenting with one week history of right sided weakness and speech difficulties.. Shedidnot receive IV t-PA due to late presentation.   Plan:-  1)PE with saddle embolus and right heart strain/Right LE DVT - ? related to long flight travel vs. Hypercoagulable state secondary to  breast cancer- continue therapeutic Lovenox, TCD bubble study no PFO. Hemoptysis persist, pt and her daughter are considering coumadin due to cost, patient has no insurance, canonly get Xarelto or Eliquis for 1 month, given history of underlying malignancy with hypercoagulable state Lovenox and Coumadin will be preferred over NOA. C/n Coumadin therapy (started on 04/29/16)  to overlap with Lovenox. Discharge Home with daughter after overlapping therapeutic INR with Lovenox for 24 hours at least  2)Stuttering speech- resolved speech concerns, MRI and MRA of the brain without acute stroke findings, patient has old basal ganglia infarct, echo with EF of 55-60% without PFO on bubble study, LDL is 127, carotid Dopplers without acute findings, continue aspirin and Lipitor  3)Chiari malformation- 9 mm tonsillar descent without visible hydromyelia, asymptomatic, outpatient neurosurgical consult advised  4)Hypertension- stable at this time  5)Diabetes- A1c is over 12, due to cost concerns, we will initiate metformin and glipizide to see if we can get adequate control with oral agents, stop Lantus insulin as patient would not be able to afford Lantus post discharge. If insulin is required at the time of discharge please use NPH due to cost.  Use Novolog/Humalog Sliding scale insulin with Accu-Cheks/Fingersticks as ordered,  6)Breast cancer - s/p mastectomy and undergoing chemotherapy, To rule out metastatic disease on chest CT - F/U non contrast chest CT in 3 - 6  months.  7)Social- discharge plan discussed with patient and patient's daughter Guy Begin who is an Therapist, sports, given lack of insurance and cost concerns in the post discharge environment Coumadin therapy  Has been initiated  Code Status:Full   Disposition Plan:Home with daughter after overlapping therapeutic INR with Lovenox for 24 hours at least  Consults :Neurology signed off  DVT Prophylaxis: Lovenox    Lab Results    Component Value Date   PLT 213 05/01/2016    Inpatient Medications  Scheduled Meds: . atorvastatin  20 mg Oral QHS  . coumadin book   Does not apply Once  . enoxaparin (LOVENOX) injection  80 mg Subcutaneous Q12H  . guaiFENesin  600 mg Oral BID  . insulin aspart  0-9 Units Subcutaneous Q4H  . insulin glargine  10 Units Subcutaneous QHS  . polyethylene glycol  17 g Oral Daily  . warfarin  5 mg Oral q1800  . warfarin   Does not apply Once  . Warfarin - Pharmacist Dosing Inpatient   Does not apply q1800   Continuous Infusions: PRN Meds:.acetaminophen **OR** acetaminophen (TYLENOL) oral liquid 160 mg/5 mL **OR** acetaminophen, alum & mag hydroxide-simeth, docusate sodium, guaiFENesin-dextromethorphan, ondansetron (ZOFRAN) IV    Anti-infectives    Start     Dose/Rate Route Frequency Ordered Stop   04/24/16 0445  vancomycin (VANCOCIN) 1,500 mg in sodium chloride 0.9 % 500 mL IVPB     1,500 mg 250 mL/hr over 120 Minutes Intravenous  Once 04/24/16 0440 04/24/16 0659   04/24/16 0445  piperacillin-tazobactam (ZOSYN) IVPB 3.375 g     3.375 g 100 mL/hr over 30 Minutes Intravenous  Once 04/24/16 0440 04/24/16 0526        Objective:   Vitals:   04/30/16 1039 04/30/16 1512 04/30/16 2029 05/01/16 0459  BP: 109/64 105/71 121/63 119/71  Pulse: (!) 105 (!) 107 (!) 47 95  Resp: (!) 22 20 18 18   Temp: 98.5 F (36.9 C) 99.3 F (37.4 C) 98.8 F (37.1 C) 98.9 F (37.2 C)  TempSrc:  Oral Oral Oral  SpO2: 94% 92% 100% 95%  Weight:      Height:        Wt Readings from Last 3 Encounters:  04/27/16 79.1 kg (174 lb 6.4 oz)     Intake/Output Summary (Last 24 hours) at 05/01/16 0847 Last data filed at 05/01/16 0030  Gross per 24 hour  Intake                0 ml  Output              800 ml  Net             -800 ml     Physical Exam  Gen:- Awake Alert,  In no apparent distress  HEENT:- [place].AT, No sclera icterus Neck-Supple Neck,No JVD,.  Lungs-  CTAB  CV- S1, S2 normal Abd-   +ve B.Sounds, Abd Soft, No tenderness,    Extremity/Skin:- No  edema,      Data Review:   Micro Results Recent Results (from the past 240 hour(s))  MRSA PCR Screening     Status: None   Collection Time: 04/24/16  6:31 AM  Result Value Ref Range Status   MRSA by PCR NEGATIVE NEGATIVE Final    Comment:        The GeneXpert MRSA Assay (FDA approved for NASAL specimens only), is one component of a comprehensive MRSA colonization surveillance program. It is not intended to diagnose MRSA infection  nor to guide or monitor treatment for MRSA infections.     Radiology Reports Dg Chest 2 View  Result Date: 04/24/2016 CLINICAL DATA:  Cough. History of hypertension and CHF. History of breast cancer. Patient has had loss of function on the right side over body and has difficulty speaking. EXAM: CHEST  2 VIEW COMPARISON:  None. FINDINGS: Normal heart size and pulmonary vascularity. No focal airspace disease or consolidation in the lungs. No blunting of costophrenic angles. No pneumothorax. Mediastinal contours appear intact. Surgical clips in the left axilla. Degenerative changes in the spine. IMPRESSION: No active cardiopulmonary disease. Electronically Signed   By: Lucienne Capers M.D.   On: 04/24/2016 01:33   Ct Head Wo Contrast  Result Date: 04/24/2016 CLINICAL DATA:  Acute onset of slurred speech and bilateral leg weakness. Initial encounter. EXAM: CT HEAD WITHOUT CONTRAST TECHNIQUE: Contiguous axial images were obtained from the base of the skull through the vertex without intravenous contrast. COMPARISON:  None. FINDINGS: Brain: No evidence of acute infarction, hemorrhage, hydrocephalus, extra-axial collection or mass lesion/mass effect. Prominence of the ventricles and sulci reflects mild cortical volume loss. Scattered periventricular subcortical white matter change likely reflects small vessel ischemic microangiopathy. A small chronic lacunar infarct is noted at the right basal ganglia.  The brainstem and fourth ventricle are within normal limits. The cerebral hemispheres demonstrate grossly normal gray-white differentiation. No mass effect or midline shift is seen. Vascular: No hyperdense vessel or unexpected calcification. Skull: There is no evidence of fracture; visualized osseous structures are unremarkable in appearance. Sinuses/Orbits: The orbits are within normal limits. The paranasal sinuses and mastoid air cells are well-aerated. Other: No significant soft tissue abnormalities are seen. IMPRESSION: 1. No acute intracranial pathology seen on CT. 2. Mild cortical volume loss and scattered small vessel ischemic microangiopathy. 3. Small chronic lacunar infarct at the right basal ganglia. Electronically Signed   By: Garald Balding M.D.   On: 04/24/2016 01:54   Ct Angio Chest Pe W And/or Wo Contrast  Result Date: 04/24/2016 CLINICAL DATA:  Cough, chest pain, and shortness of breath for 2 weeks. History of breast cancer post left mastectomy. Chemotherapy. Multiple recent strokes. Diabetes. EXAM: CT ANGIOGRAPHY CHEST WITH CONTRAST TECHNIQUE: Multidetector CT imaging of the chest was performed using the standard protocol during bolus administration of intravenous contrast. Multiplanar CT image reconstructions and MIPs were obtained to evaluate the vascular anatomy. CONTRAST:  100 mL Isovue 370 COMPARISON:  None. FINDINGS: Cardiovascular: Filling defects demonstrated in the main and bilateral lobar pulmonary artery is extending into bilateral upper and lower lobe branches consistent with saddle embolus with moderately large clot burden. The RV to LV ratio is elevated at 1.19, suggesting risk of right heart strain. No pericardial effusion. Dilated ascending thoracic aorta at 3.5 cm. No dissection. Scattered calcification. Mediastinum/Nodes: Small esophageal hiatal hernia. Esophagus is decompressed. No significant lymphadenopathy in the chest. Lungs/Pleura: Evaluation is limited due to motion  artifact. There patchy nodular ground-glass infiltrative changes demonstrated centrally in the lungs, in the lung bases, and scattered throughout the upper lungs. This may be due to multifocal pneumonia, edema, or infarcts. Metastatic disease would be a less likely consideration. No pleural effusions. No pneumothorax. Upper Abdomen: No acute abnormality. Musculoskeletal: Postoperative changes consistent with left mastectomy and surgical clips in the left axilla. No destructive bone lesions. Review of the MIP images confirms the above findings. IMPRESSION: Positive examination for pulmonary embolus, including saddle embolus and bilateral lobar and segmental pulmonary emboli. Moderately large clot burden. Positive for acute  PE with CT evidence of right heart strain (RV/LV Ratio = 1.19) consistent with at least submassive (intermediate risk) PE. The presence of right heart strain has been associated with an increased risk of morbidity and mortality. Please activate Code PE by paging [phone]. Patchy nodular ground-glass infiltrative changes in the lungs may be due to multifocal pneumonia, edema, or infarcts. Metastatic disease would be a less likely consideration but due to history of primary carcinoma, non-contrast chest CT at 3-6 months is recommended. If nodules persist, subsequent management will be based upon the most suspicious nodule(s). This recommendation follows the consensus statement: Guidelines for Management of Incidental Pulmonary Nodules Detected on CT Images: From the Fleischner Society 2017; Radiology 2017; 284:228-243. These results were called by telephone at the time of interpretation on 04/24/2016 at 4:26 am to Dr. Deno Etienne , who verbally acknowledged these results. Electronically Signed   By: Lucienne Capers M.D.   On: 04/24/2016 04:32   Mr Brain Wo Contrast  Result Date: 04/24/2016 CLINICAL DATA:  Right-sided facial droop and difficulty speaking, symptoms for 1 week. History of breast  cancer currently on chemotherapy. Large saddle embolus on CT chest. Query paradoxical emboli. History of diabetes. EXAM: MRI HEAD WITHOUT CONTRAST MRA HEAD WITHOUT CONTRAST TECHNIQUE: Multiplanar, multiecho pulse sequences of the brain and surrounding structures were obtained without intravenous contrast. Angiographic images of the head were obtained using MRA technique without contrast. COMPARISON:  CT chest 04/24/2016.  CT head 04/24/2016. FINDINGS: MRI HEAD FINDINGS Brain: No evidence for acute infarction, hemorrhage, mass lesion, hydrocephalus, or extra-axial fluid. Moderate atrophy. Advanced T2 and FLAIR hyperintensities throughout the white matter, representing either diabetic related chronic microvascular ischemic change or post treatment effect from chemotherapy. Scattered areas of lacunar infarction in the deep nuclei and subcortical white matter. Brainstem and cerebellum spared. 9 mm of tonsillar descent below the foramen magnum consistent with Chiari I malformation. No visible hydromyelia. Partial empty sella. Vascular: Normal flow voids. Skull and upper cervical spine: Normal marrow signal. Large disc extrusion at C3-4, evaluated only on sagittal images, causing significant cord compression. Consider cervical spine MRI for further evaluation. Sinuses/Orbits: Negative. Other: None. MRA HEAD FINDINGS The internal carotid arteries are widely patent. Basilar artery is widely patent with vertebrals codominant. There is no intracranial stenosis or visible aneurysm. IMPRESSION: Moderate atrophy with advanced nonacute white matter signal abnormality. No visible acute stroke. Chiari I malformation. 9 mm tonsillar descent without visible hydromyelia. Within limits for assessment on noncontrast brain, no intracranial metastatic disease or visible osseous disease. No intracranial stenosis or occlusion. Electronically Signed   By: Staci Righter M.D.   On: 04/24/2016 13:34   Mr Jodene Nam Head/brain [id number] Cm  Result Date:  04/24/2016 CLINICAL DATA:  Right-sided facial droop and difficulty speaking, symptoms for 1 week. History of breast cancer currently on chemotherapy. Large saddle embolus on CT chest. Query paradoxical emboli. History of diabetes. EXAM: MRI HEAD WITHOUT CONTRAST MRA HEAD WITHOUT CONTRAST TECHNIQUE: Multiplanar, multiecho pulse sequences of the brain and surrounding structures were obtained without intravenous contrast. Angiographic images of the head were obtained using MRA technique without contrast. COMPARISON:  CT chest 04/24/2016.  CT head 04/24/2016. FINDINGS: MRI HEAD FINDINGS Brain: No evidence for acute infarction, hemorrhage, mass lesion, hydrocephalus, or extra-axial fluid. Moderate atrophy. Advanced T2 and FLAIR hyperintensities throughout the white matter, representing either diabetic related chronic microvascular ischemic change or post treatment effect from chemotherapy. Scattered areas of lacunar infarction in the deep nuclei and subcortical white matter. Brainstem and cerebellum spared.  9 mm of tonsillar descent below the foramen magnum consistent with Chiari I malformation. No visible hydromyelia. Partial empty sella. Vascular: Normal flow voids. Skull and upper cervical spine: Normal marrow signal. Large disc extrusion at C3-4, evaluated only on sagittal images, causing significant cord compression. Consider cervical spine MRI for further evaluation. Sinuses/Orbits: Negative. Other: None. MRA HEAD FINDINGS The internal carotid arteries are widely patent. Basilar artery is widely patent with vertebrals codominant. There is no intracranial stenosis or visible aneurysm. IMPRESSION: Moderate atrophy with advanced nonacute white matter signal abnormality. No visible acute stroke. Chiari I malformation. 9 mm tonsillar descent without visible hydromyelia. Within limits for assessment on noncontrast brain, no intracranial metastatic disease or visible osseous disease. No intracranial stenosis or occlusion.  Electronically Signed   By: Staci Righter M.D.   On: 04/24/2016 13:34     CBC  Recent Labs Lab 04/27/16 0535 04/28/16 0537 04/28/16 1615 04/29/16 0646 04/30/16 0351 05/01/16 0501  WBC 8.2 9.2  --  7.6 8.2 8.3  HGB 8.3* 7.7* 7.8* 7.8* 7.7* 7.6*  HCT 25.2* 23.6* 23.7* 24.1* 23.9* 23.9*  PLT 228 230  --  201 205 213  MCV 92.0 93.7  --  94.1 94.8 95.2  MCH 30.3 30.6  --  30.5 30.6 30.3  MCHC 32.9 32.6  --  32.4 32.2 31.8  RDW 16.9* 17.1*  --  17.4* 17.6* 17.6*    Chemistries   Recent Labs Lab 04/25/16 0426 04/28/16 0537 04/29/16 0646 04/30/16 0351  NA 139  --  140 140  K 3.8  --  3.6 3.8  CL 111  --  108 108  CO2 19*  --  25 24  GLUCOSE 117*  --  93 79  BUN 5*  --  7 5*  CREATININE 0.85 0.81 0.78 0.76  CALCIUM 8.8*  --  8.8* 8.7*   ------------------------------------------------------------------------------------------------------------------ No results for input(s): CHOL, HDL, LDLCALC, TRIG, CHOLHDL, LDLDIRECT in the last 72 hours.  Lab Results  Component Value Date   HGBA1C 12.9 (H) 04/25/2016   ------------------------------------------------------------------------------------------------------------------ No results for input(s): TSH, T4TOTAL, T3FREE, THYROIDAB in the last 72 hours.  Invalid input(s): FREET3 ------------------------------------------------------------------------------------------------------------------ No results for input(s): VITAMINB12, FOLATE, FERRITIN, TIBC, IRON, RETICCTPCT in the last 72 hours.  Coagulation profile  Recent Labs Lab 04/30/16 0351 05/01/16 0501  INR 1.20 1.24    No results for input(s): DDIMER in the last 72 hours.  Cardiac Enzymes No results for input(s): CKMB, TROPONINI, MYOGLOBIN in the last 168 hours.  Invalid input(s): CK ------------------------------------------------------------------------------------------------------------------ No results found for: BNP   Dione Mccombie M.D on 05/01/2016  at 8:47 AM  Between 7am to 7pm - Pager - [phone]  After 7pm go to [email or web address] - password [email or web address]  [facility] -  Office  [phone]  Dragon dictation system was used to create this note, attempts have been made to correct errors, however presence of uncorrected errors is not a reflection quality of care provided

## 2016-05-01 NOTE — Progress Notes (Signed)
Pt's daughter called wanting to talk to the MD concerning the pt's meds. Pt said that it is alright for her daughter to have this information. MD made aware and given the number to call.

## 2016-05-01 NOTE — Progress Notes (Signed)
ANTICOAGULATION CONSULT NOTE - follow up  Pharmacy Consult for Lovenox >> Coumadin Indication: pulmonary embolus /DVT RLE  Allergies  Allergen Reactions  . Dust Mite Extract     Patient Measurements: Height: 5\' 3"  (160 cm) Weight: 174 lb 6.4 oz (79.1 kg) IBW/kg (Calculated) : 52.4  Addendum: re-weighed 04/27/16 = 79.1 kg    Vital Signs: Temp: 98.9 F (37.2 C) (01/21 0459) Temp Source: Oral (01/21 0459) BP: 119/71 (01/21 0459) Pulse Rate: 95 (01/21 0459)  Labs:  Recent Labs  04/29/16 0646 04/30/16 0351 05/01/16 0501  HGB 7.8* 7.7* 7.6*  HCT 24.1* 23.9* 23.9*  PLT 201 205 213  LABPROT  --  15.2 15.7*  INR  --  1.20 1.24  CREATININE 0.78 0.76  --     Estimated Creatinine Clearance: 68 mL/min (by C-G formula based on SCr of 0.76 mg/dL).   Medical History: Past Medical History:  Diagnosis Date  . Cancer (HCC)    breast  . CHF (congestive heart failure) ([place])   . Diabetes mellitus without complication ([place])   . Renal disorder     Assessment: 66yo female with new/acute saddle pulmonary embolus in setting of breast cancer. Initial plan was to transition from lovenox to NOAC but due to insurance issues decision made to transition to coumadin that was started on 1/19.  INR remains subtherapeutic this am at 1.22 after coumadin started on 1/19.  Hgb 7.6 low but stable, PLTc 213.  Goal of Therapy:  Anti-Xa level 0.6-1 units/ml 4hrs after LMWH dose given Monitor platelets by anticoagulation protocol: Yes  INR: 2 -3    Plan:  1. Continue Lovenox 80 mg sq Q 12 hours 2. Coumadin 7.5 mg po today at 1800 pm 3. Daily INR  Vincenza Hews, PharmD, BCPS 05/01/2016, 11:11 AM

## 2016-05-02 ENCOUNTER — Telehealth: Payer: Self-pay

## 2016-05-02 DIAGNOSIS — I130 Hypertensive heart and chronic kidney disease with heart failure and stage 1 through stage 4 chronic kidney disease, or unspecified chronic kidney disease: Secondary | ICD-10-CM | POA: Diagnosis not present

## 2016-05-02 DIAGNOSIS — I639 Cerebral infarction, unspecified: Secondary | ICD-10-CM | POA: Diagnosis not present

## 2016-05-02 DIAGNOSIS — E1122 Type 2 diabetes mellitus with diabetic chronic kidney disease: Secondary | ICD-10-CM | POA: Diagnosis not present

## 2016-05-02 DIAGNOSIS — I82491 Acute embolism and thrombosis of other specified deep vein of right lower extremity: Secondary | ICD-10-CM | POA: Diagnosis not present

## 2016-05-02 DIAGNOSIS — I509 Heart failure, unspecified: Secondary | ICD-10-CM | POA: Diagnosis not present

## 2016-05-02 DIAGNOSIS — I2692 Saddle embolus of pulmonary artery without acute cor pulmonale: Secondary | ICD-10-CM | POA: Diagnosis not present

## 2016-05-02 DIAGNOSIS — I82401 Acute embolism and thrombosis of unspecified deep veins of right lower extremity: Secondary | ICD-10-CM | POA: Diagnosis not present

## 2016-05-02 DIAGNOSIS — E871 Hypo-osmolality and hyponatremia: Secondary | ICD-10-CM | POA: Diagnosis not present

## 2016-05-02 DIAGNOSIS — I2602 Saddle embolus of pulmonary artery with acute cor pulmonale: Secondary | ICD-10-CM | POA: Diagnosis not present

## 2016-05-02 DIAGNOSIS — E119 Type 2 diabetes mellitus without complications: Secondary | ICD-10-CM | POA: Diagnosis not present

## 2016-05-02 LAB — GLUCOSE, CAPILLARY
GLUCOSE-CAPILLARY: 88 mg/dL (ref 65–99)
GLUCOSE-CAPILLARY: 65 mg/dL (ref 65–99)
GLUCOSE-CAPILLARY: 77 mg/dL (ref 65–99)
GLUCOSE-CAPILLARY: 80 mg/dL (ref 65–99)
Glucose-Capillary: 65 mg/dL (ref 65–99)
Glucose-Capillary: 69 mg/dL (ref 65–99)
Glucose-Capillary: 77 mg/dL (ref 65–99)
Glucose-Capillary: 87 mg/dL (ref 65–99)

## 2016-05-02 LAB — COMPREHENSIVE METABOLIC PANEL
ALT: 30 U/L (ref 14–54)
AST: 49 U/L — ABNORMAL HIGH (ref 15–41)
Albumin: 2.5 g/dL — ABNORMAL LOW (ref 3.5–5.0)
Alkaline Phosphatase: 73 U/L (ref 38–126)
Anion gap: 11 (ref 5–15)
BILIRUBIN TOTAL: 0.4 mg/dL (ref 0.3–1.2)
BUN: 8 mg/dL (ref 6–20)
CO2: 21 mmol/L — ABNORMAL LOW (ref 22–32)
Calcium: 9.1 mg/dL (ref 8.9–10.3)
Chloride: 105 mmol/L (ref 101–111)
Creatinine, Ser: 0.83 mg/dL (ref 0.44–1.00)
Glucose, Bld: 82 mg/dL (ref 65–99)
POTASSIUM: 3.9 mmol/L (ref 3.5–5.1)
Sodium: 137 mmol/L (ref 135–145)
TOTAL PROTEIN: 6.7 g/dL (ref 6.5–8.1)

## 2016-05-02 LAB — BASIC METABOLIC PANEL
Anion gap: 7 (ref 5–15)
BUN: <5 mg/dL — ABNORMAL LOW (ref 6–20)
CHLORIDE: 105 mmol/L (ref 101–111)
CO2: 25 mmol/L (ref 22–32)
CREATININE: 0.72 mg/dL (ref 0.44–1.00)
Calcium: 8.8 mg/dL — ABNORMAL LOW (ref 8.9–10.3)
GFR calc Af Amer: >60 mL/min (ref >60)
GFR calc non Af Amer: >60 mL/min (ref >60)
Glucose, Bld: 90 mg/dL (ref 65–99)
Potassium: 3.9 mmol/L (ref 3.5–5.1)
Sodium: 137 mmol/L (ref 135–145)

## 2016-05-02 LAB — CBC
HCT: 24.4 % — ABNORMAL LOW (ref 36.0–46.0)
HEMOGLOBIN: 7.7 g/dL — AB (ref 12.0–15.0)
MCH: 30.3 pg (ref 26.0–34.0)
MCHC: 31.6 g/dL (ref 30.0–36.0)
MCV: 96.1 fL (ref 78.0–100.0)
Platelets: 217 10*3/uL (ref 150–400)
RBC: 2.54 MIL/uL — AB (ref 3.87–5.11)
RDW: 17.8 % — ABNORMAL HIGH (ref 11.5–15.5)
WBC: 7.3 10*3/uL (ref 4.0–10.5)

## 2016-05-02 LAB — C DIFFICILE QUICK SCREEN W PCR REFLEX
C Diff antigen: NEGATIVE
C Diff interpretation: NOT DETECTED
C Diff toxin: NEGATIVE

## 2016-05-02 LAB — PROTIME-INR
INR: 1.33
PROTHROMBIN TIME: 16.6 s — AB (ref 11.4–15.2)

## 2016-05-02 MED ORDER — DIPHENOXYLATE-ATROPINE 2.5-0.025 MG/5ML PO LIQD
10.0000 mL | Freq: Once | ORAL | Status: AC
  Administered 2016-05-02: 10 mL via ORAL

## 2016-05-02 MED ORDER — LOPERAMIDE HCL 2 MG PO CAPS: 2.0000 mg | ORAL_CAPSULE | Freq: Four times a day (QID) | ORAL | Status: DC | PRN

## 2016-05-02 MED ORDER — ENOXAPARIN SODIUM 80 MG/0.8ML ~~LOC~~ SOLN: 80.0000 mg | Freq: Two times a day (BID) | SUBCUTANEOUS | 0 refills | Status: DC

## 2016-05-02 MED ORDER — INSULIN ASPART 100 UNIT/ML ~~LOC~~ SOLN: 1.0000 [IU] | Freq: Two times a day (BID) | SUBCUTANEOUS | Status: DC

## 2016-05-02 MED ORDER — GLUCOSE 40 % PO GEL
ORAL | Status: AC
  Administered 2016-05-02: 37.5 g
  Filled 2016-05-02: qty 1

## 2016-05-02 NOTE — Progress Notes (Signed)
Speech Language Pathology Treatment: Dysphagia  Patient Details Name: Carrie Watts MRN: [id number] DOB: 11-27-1950 Today's Date: 05/02/2016 Time: 0737-1062 SLP Time Calculation (min) (ACUTE ONLY): 8 min  Assessment / Plan / Recommendation Clinical Impression  Treatment focused on dysphagia; delayed cough following cracker. Pt exhibits odd respiratory pattern, increased work of breathing which appears to be more behavioral/anxiety in nature vs true airway compromise? Pt educated to take small sips and bites. Functional mastication with cracker. Recommend upgrade to regular texture and continue thin, straws allowed. No f/u ST needed.    HPI HPI: 66 y/o female came to the [facility] ED on 1/14 with cough, dyspnea and chest pain in the setting of two weeks of R sided facial droop and slurred speech after returning from [place] on 1/13.  Noted to have a large PE on CT angiogram so PCCM was consulted for further evaluation.  She is being evaluated by neurology for a possible stroke.  She has received lovenox for the PE.  Neurology has ordered an MRI of the brain      SLP Plan  All goals met;Discharge SLP treatment due to (comment)     Recommendations  Diet recommendations: Regular;Thin liquid Liquids provided via: Cup;Straw Medication Administration: Whole meds with puree Supervision: Patient able to self feed Compensations: Slow rate;Small sips/bites Postural Changes and/or Swallow Maneuvers: Seated upright 90 degrees                Oral Care Recommendations: Oral care BID Follow up Recommendations: None Plan: All goals met;Discharge SLP treatment due to (comment)       GO                Carrie Watts 05/02/2016, 12:54 PM  Carrie Watts Caroli.Ed [employer] [phone]

## 2016-05-02 NOTE — Progress Notes (Signed)
Faxed Lovenox Rx to [facility] outpatient pharmacy with request to order so quantity will be available.

## 2016-05-02 NOTE — Progress Notes (Signed)
Patient Demographics:    Carrie Watts, is a 66 y.o. female, DOB - Jul 17, 1950, [id number]  Admit date - 04/24/2016   Admitting Physician Etta Quill, DO  Outpatient Primary MD for the patient is No primary care provider on file.  LOS - 8  Chief Complaint  Patient presents with  . difficulty speaking        Subjective:    Carrie Watts today has no fevers, no emesis,  No chest pain,  Patient complains of loose stools, she had been on MiraLAX due to constipation, sputum is less bloody,   Assessment  & Plan :    Principal Problem:   Saddle pulmonary embolus (HCC) Active Problems:   DM2 (diabetes mellitus, type 2) (HCC)   Stroke-like symptoms   Acute saddle pulmonary embolism with acute cor pulmonale (HCC)   History of cancer of left breast   Pressure injury of skin   Acute deep vein thrombosis (DVT) of right lower extremity ([place])   Hyperlipidemia   Brief Narrative  66 year old female with history of diabetes mellitus, left breast cancer status post lumpectomy who recently flew back from [place] presented with right-sided facial droop with difficulty speaking, chest discomfort, cough and shortness of breath. These symptoms were going on for few days. Workup done in the ED showed a saddle pulmonary emboli. Admitted to hospitalist service on anticoagulation. PCCM consulted. Neurology consulted for strokelike symptoms.  Pt was seen at vascular lab. Her TCD Bubble study negative for PFO. However, She was found to have right LE DVT.  Carrie Stewartis a 66 y.o.femalewith history of breast CA, CHF, CKD, and DMpresenting with one week history of right sided weakness and speech difficulties.. Shedidnot receive IV t-PA due to late presentation. Please note the patient has no insurance and no prescription coverage so attempts have been made to discharge her on medications that are affordable, to  this end metformin and glipizide will be used to treat her diabetes (patient may not be able to afford insulin with supplies). Social workers arranged for patient to get one month of Lovenox for free upon discharge, patient will be seen at sickle cell clinic on 05/23/2016 and at that time she'll be transitioned to oral Coumadin which  will be more affordable with ongoing INR checks at the sickle cell clinic   Plan:-  1)PE with saddle embolus and right heart strain/Right LE DVT - ? related to long flight travel vs. Hypercoagulable state secondary to breast cancer- continue therapeutic Lovenox, TCD bubble study no PFO. Hemoptysis  resolved , has occasional bloody sputum however .  given history of underlying malignancy with hypercoagulable state Lovenox and Coumadin will be preferred over NOA.  Patient will most likely need lifelong anticoagulation.  Please note the patient has no insurance and no prescription coverage so attempts have been made to discharge her on medications that are affordable, with this in mind   Social workers arranged for patient to get one month of Lovenox for free upon discharge, patient will be seen at sickle cell clinic on 05/23/2016 and at that time she'll be transitioned to oral Coumadin which  will be more affordable with ongoing INR checks at the sickle cell clinic. Please discharge home with nebulizer machine  And albuterol solution  to be used at home when necessary for shortness of breath and cough. No hypoxia at this time  2)Stuttering speech- resolved speech concerns, MRI and MRA of the brain without acute stroke findings, patient has old basal ganglia infarct, echo with EF of 55-60% without PFO on bubble study, LDL is 127, carotid Dopplers without acute findings, continue aspirin and Lipitor (consider discharge on lovastatin which may be cheaper)   3)Chiari malformation- 9 mm tonsillar descent without visible hydromyelia, asymptomatic, outpatient neurosurgical  consult advised  4)Hypertension- stable at this time, okay to use lisinopril 5 mg daily mostly for renal protection in the setting of diabetes  5)Diabetes- A1c is over 12, this is a new diagnosis for patient, Please note the patient has no insurance and no prescription coverage so attempts have been made to discharge her on medications that are affordable, to this end metformin and glipizide will be used to treat her diabetes (patient may not be able to afford insulin with supplies). C/n metformin and glipizide Daughter will buy Relion Prime Glucometer and Relion Prime Test strips from [employer] are affordable.   Use Novolog/Humalog Sliding scale insulin with Accu-Cheks/Fingersticks as ordered,   6)Breast cancer - s/p mastectomy and undergoing chemotherapy, To rule out metastatic disease on chest CT - F/U non contrast chest CT in 3 - 6 months. Patient was previously receiving chemotherapy in [place], follow-up with oncologist post discharge  7)Social- discharge plan discussed with patient and patient's daughter Guy Begin who is an Therapist, sports, given lack of insurance and cost concerns in the post discharge environment . Please note the patient has no insurance and no prescription coverage so attempts have been made to discharge her on medications that are affordable, to this end metformin and glipizide will be used to treat her diabetes (patient may not be able to afford insulin with supplies). Social workers arranged for patient to get one month of Lovenox for free upon discharge, patient will be seen at sickle cell clinic on 05/23/2016 and at that time she'll be transitioned to oral Coumadin which  will be more affordable with ongoing INR checks at the sickle cell clinic. Please discharge home with nebulizer machine  And albuterol solution to be used at home when necessary for shortness of breath and cough  8)Diarrhea- C. difficile is negative, patient was previously on MiraLAX, use Imodium when  necessary, watch for dehydration and that showed abnormalities  Code Status:Full   Disposition Plan:Home with daughter on 05/03/16 if the social worker able to obtain Lovenox for free for patient Consults :Neurology signed off  DVT Prophylaxis: Lovenox    Lab Results  Component Value Date   PLT 217 05/02/2016    Inpatient Medications  Scheduled Meds: . atorvastatin  20 mg Oral QHS  . coumadin book   Does not apply Once  . diphenoxylate-atropine  10 mL Oral Once  . enoxaparin (LOVENOX) injection  80 mg Subcutaneous Q12H  . glipiZIDE  5 mg Oral BID AC  . guaiFENesin  600 mg Oral BID  . insulin aspart  1 Units Subcutaneous BID WC  . metFORMIN  1,000 mg Oral BID WC  . warfarin  7.5 mg Oral q1800  . warfarin   Does not apply Once  . Warfarin - Pharmacist Dosing Inpatient   Does not apply q1800   Continuous Infusions: PRN Meds:.acetaminophen **OR** acetaminophen (TYLENOL) oral liquid 160 mg/5 mL **OR** acetaminophen, alum & mag hydroxide-simeth, docusate sodium, guaiFENesin-dextromethorphan, loperamide, ondansetron (ZOFRAN) IV    Anti-infectives  Start     Dose/Rate Route Frequency Ordered Stop   04/24/16 0445  vancomycin (VANCOCIN) 1,500 mg in sodium chloride 0.9 % 500 mL IVPB     1,500 mg 250 mL/hr over 120 Minutes Intravenous  Once 04/24/16 0440 04/24/16 0659   04/24/16 0445  piperacillin-tazobactam (ZOSYN) IVPB 3.375 g     3.375 g 100 mL/hr over 30 Minutes Intravenous  Once 04/24/16 0440 04/24/16 0526        Objective:   Vitals:   05/02/16 0015 05/02/16 0116 05/02/16 0508 05/02/16 1432  BP:  118/71 129/79 110/61  Pulse:  93 (!) 102 (!) 115  Resp:  18 18 18   Temp: 99.3 F (37.4 C) 99.1 F (37.3 C) 98.8 F (37.1 C) 98.4 F (36.9 C)  TempSrc: Oral Oral Oral   SpO2:  93% 95% 95%  Weight:      Height:        Wt Readings from Last 3 Encounters:  04/27/16 79.1 kg (174 lb 6.4 oz)    No intake or output data in the 24 hours ending 05/02/16  1557   Physical Exam  Gen:- Awake Alert,  In no apparent distress , obese HEENT:- [place].AT, No sclera icterus,  Neck-Supple Neck,No JVD,.  Lungs- mostly clear bilaterally CV- S1, S2 normal Abd-  +ve B.Sounds, Abd Soft, No tenderness,    Extremity/Skin:- No  edema,   intact pulses   Data Review:   Micro Results Recent Results (from the past 240 hour(s))  MRSA PCR Screening     Status: None   Collection Time: 04/24/16  6:31 AM  Result Value Ref Range Status   MRSA by PCR NEGATIVE NEGATIVE Final    Comment:        The GeneXpert MRSA Assay (FDA approved for NASAL specimens only), is one component of a comprehensive MRSA colonization surveillance program. It is not intended to diagnose MRSA infection nor to guide or monitor treatment for MRSA infections.   C difficile quick scan w PCR reflex     Status: None   Collection Time: 05/02/16  9:44 AM  Result Value Ref Range Status   C Diff antigen NEGATIVE NEGATIVE Final   C Diff toxin NEGATIVE NEGATIVE Final   C Diff interpretation No C. difficile detected.  Final    Comment: NEGATIVE    Radiology Reports Dg Chest 2 View  Result Date: 04/24/2016 CLINICAL DATA:  Cough. History of hypertension and CHF. History of breast cancer. Patient has had loss of function on the right side over body and has difficulty speaking. EXAM: CHEST  2 VIEW COMPARISON:  None. FINDINGS: Normal heart size and pulmonary vascularity. No focal airspace disease or consolidation in the lungs. No blunting of costophrenic angles. No pneumothorax. Mediastinal contours appear intact. Surgical clips in the left axilla. Degenerative changes in the spine. IMPRESSION: No active cardiopulmonary disease. Electronically Signed   By: Lucienne Capers M.D.   On: 04/24/2016 01:33   Ct Head Wo Contrast  Result Date: 04/24/2016 CLINICAL DATA:  Acute onset of slurred speech and bilateral leg weakness. Initial encounter. EXAM: CT HEAD WITHOUT CONTRAST TECHNIQUE: Contiguous axial  images were obtained from the base of the skull through the vertex without intravenous contrast. COMPARISON:  None. FINDINGS: Brain: No evidence of acute infarction, hemorrhage, hydrocephalus, extra-axial collection or mass lesion/mass effect. Prominence of the ventricles and sulci reflects mild cortical volume loss. Scattered periventricular subcortical white matter change likely reflects small vessel ischemic microangiopathy. A small chronic lacunar infarct is noted  at the right basal ganglia. The brainstem and fourth ventricle are within normal limits. The cerebral hemispheres demonstrate grossly normal gray-white differentiation. No mass effect or midline shift is seen. Vascular: No hyperdense vessel or unexpected calcification. Skull: There is no evidence of fracture; visualized osseous structures are unremarkable in appearance. Sinuses/Orbits: The orbits are within normal limits. The paranasal sinuses and mastoid air cells are well-aerated. Other: No significant soft tissue abnormalities are seen. IMPRESSION: 1. No acute intracranial pathology seen on CT. 2. Mild cortical volume loss and scattered small vessel ischemic microangiopathy. 3. Small chronic lacunar infarct at the right basal ganglia. Electronically Signed   By: Garald Balding M.D.   On: 04/24/2016 01:54   Ct Angio Chest Pe W And/or Wo Contrast  Result Date: 04/24/2016 CLINICAL DATA:  Cough, chest pain, and shortness of breath for 2 weeks. History of breast cancer post left mastectomy. Chemotherapy. Multiple recent strokes. Diabetes. EXAM: CT ANGIOGRAPHY CHEST WITH CONTRAST TECHNIQUE: Multidetector CT imaging of the chest was performed using the standard protocol during bolus administration of intravenous contrast. Multiplanar CT image reconstructions and MIPs were obtained to evaluate the vascular anatomy. CONTRAST:  100 mL Isovue 370 COMPARISON:  None. FINDINGS: Cardiovascular: Filling defects demonstrated in the main and bilateral lobar  pulmonary artery is extending into bilateral upper and lower lobe branches consistent with saddle embolus with moderately large clot burden. The RV to LV ratio is elevated at 1.19, suggesting risk of right heart strain. No pericardial effusion. Dilated ascending thoracic aorta at 3.5 cm. No dissection. Scattered calcification. Mediastinum/Nodes: Small esophageal hiatal hernia. Esophagus is decompressed. No significant lymphadenopathy in the chest. Lungs/Pleura: Evaluation is limited due to motion artifact. There patchy nodular ground-glass infiltrative changes demonstrated centrally in the lungs, in the lung bases, and scattered throughout the upper lungs. This may be due to multifocal pneumonia, edema, or infarcts. Metastatic disease would be a less likely consideration. No pleural effusions. No pneumothorax. Upper Abdomen: No acute abnormality. Musculoskeletal: Postoperative changes consistent with left mastectomy and surgical clips in the left axilla. No destructive bone lesions. Review of the MIP images confirms the above findings. IMPRESSION: Positive examination for pulmonary embolus, including saddle embolus and bilateral lobar and segmental pulmonary emboli. Moderately large clot burden. Positive for acute PE with CT evidence of right heart strain (RV/LV Ratio = 1.19) consistent with at least submassive (intermediate risk) PE. The presence of right heart strain has been associated with an increased risk of morbidity and mortality. Please activate Code PE by paging [phone]. Patchy nodular ground-glass infiltrative changes in the lungs may be due to multifocal pneumonia, edema, or infarcts. Metastatic disease would be a less likely consideration but due to history of primary carcinoma, non-contrast chest CT at 3-6 months is recommended. If nodules persist, subsequent management will be based upon the most suspicious nodule(s). This recommendation follows the consensus statement: Guidelines for Management  of Incidental Pulmonary Nodules Detected on CT Images: From the Fleischner Society 2017; Radiology 2017; 284:228-243. These results were called by telephone at the time of interpretation on 04/24/2016 at 4:26 am to Dr. Deno Etienne , who verbally acknowledged these results. Electronically Signed   By: Lucienne Capers M.D.   On: 04/24/2016 04:32   Mr Brain Wo Contrast  Result Date: 04/24/2016 CLINICAL DATA:  Right-sided facial droop and difficulty speaking, symptoms for 1 week. History of breast cancer currently on chemotherapy. Large saddle embolus on CT chest. Query paradoxical emboli. History of diabetes. EXAM: MRI HEAD WITHOUT CONTRAST MRA HEAD WITHOUT  CONTRAST TECHNIQUE: Multiplanar, multiecho pulse sequences of the brain and surrounding structures were obtained without intravenous contrast. Angiographic images of the head were obtained using MRA technique without contrast. COMPARISON:  CT chest 04/24/2016.  CT head 04/24/2016. FINDINGS: MRI HEAD FINDINGS Brain: No evidence for acute infarction, hemorrhage, mass lesion, hydrocephalus, or extra-axial fluid. Moderate atrophy. Advanced T2 and FLAIR hyperintensities throughout the white matter, representing either diabetic related chronic microvascular ischemic change or post treatment effect from chemotherapy. Scattered areas of lacunar infarction in the deep nuclei and subcortical white matter. Brainstem and cerebellum spared. 9 mm of tonsillar descent below the foramen magnum consistent with Chiari I malformation. No visible hydromyelia. Partial empty sella. Vascular: Normal flow voids. Skull and upper cervical spine: Normal marrow signal. Large disc extrusion at C3-4, evaluated only on sagittal images, causing significant cord compression. Consider cervical spine MRI for further evaluation. Sinuses/Orbits: Negative. Other: None. MRA HEAD FINDINGS The internal carotid arteries are widely patent. Basilar artery is widely patent with vertebrals codominant. There  is no intracranial stenosis or visible aneurysm. IMPRESSION: Moderate atrophy with advanced nonacute white matter signal abnormality. No visible acute stroke. Chiari I malformation. 9 mm tonsillar descent without visible hydromyelia. Within limits for assessment on noncontrast brain, no intracranial metastatic disease or visible osseous disease. No intracranial stenosis or occlusion. Electronically Signed   By: Staci Righter M.D.   On: 04/24/2016 13:34   Mr Jodene Nam Head/brain [id number] Cm  Result Date: 04/24/2016 CLINICAL DATA:  Right-sided facial droop and difficulty speaking, symptoms for 1 week. History of breast cancer currently on chemotherapy. Large saddle embolus on CT chest. Query paradoxical emboli. History of diabetes. EXAM: MRI HEAD WITHOUT CONTRAST MRA HEAD WITHOUT CONTRAST TECHNIQUE: Multiplanar, multiecho pulse sequences of the brain and surrounding structures were obtained without intravenous contrast. Angiographic images of the head were obtained using MRA technique without contrast. COMPARISON:  CT chest 04/24/2016.  CT head 04/24/2016. FINDINGS: MRI HEAD FINDINGS Brain: No evidence for acute infarction, hemorrhage, mass lesion, hydrocephalus, or extra-axial fluid. Moderate atrophy. Advanced T2 and FLAIR hyperintensities throughout the white matter, representing either diabetic related chronic microvascular ischemic change or post treatment effect from chemotherapy. Scattered areas of lacunar infarction in the deep nuclei and subcortical white matter. Brainstem and cerebellum spared. 9 mm of tonsillar descent below the foramen magnum consistent with Chiari I malformation. No visible hydromyelia. Partial empty sella. Vascular: Normal flow voids. Skull and upper cervical spine: Normal marrow signal. Large disc extrusion at C3-4, evaluated only on sagittal images, causing significant cord compression. Consider cervical spine MRI for further evaluation. Sinuses/Orbits: Negative. Other: None. MRA HEAD FINDINGS  The internal carotid arteries are widely patent. Basilar artery is widely patent with vertebrals codominant. There is no intracranial stenosis or visible aneurysm. IMPRESSION: Moderate atrophy with advanced nonacute white matter signal abnormality. No visible acute stroke. Chiari I malformation. 9 mm tonsillar descent without visible hydromyelia. Within limits for assessment on noncontrast brain, no intracranial metastatic disease or visible osseous disease. No intracranial stenosis or occlusion. Electronically Signed   By: Staci Righter M.D.   On: 04/24/2016 13:34     CBC  Recent Labs Lab 04/28/16 0537 04/28/16 1615 04/29/16 0646 04/30/16 0351 05/01/16 0501 05/02/16 0534  WBC 9.2  --  7.6 8.2 8.3 7.3  HGB 7.7* 7.8* 7.8* 7.7* 7.6* 7.7*  HCT 23.6* 23.7* 24.1* 23.9* 23.9* 24.4*  PLT 230  --  201 205 213 217  MCV 93.7  --  94.1 94.8 95.2 96.1  MCH 30.6  --  30.5 30.6 30.3 30.3  MCHC 32.6  --  32.4 32.2 31.8 31.6  RDW 17.1*  --  17.4* 17.6* 17.6* 17.8*    Chemistries   Recent Labs Lab 04/28/16 0537 04/29/16 0646 04/30/16 0351 05/02/16 0534  NA  --  140 140 137  K  --  3.6 3.8 3.9  CL  --  108 108 105  CO2  --  25 24 25   GLUCOSE  --  93 79 90  BUN  --  7 5* <5*  CREATININE 0.81 0.78 0.76 0.72  CALCIUM  --  8.8* 8.7* 8.8*   ------------------------------------------------------------------------------------------------------------------ No results for input(s): CHOL, HDL, LDLCALC, TRIG, CHOLHDL, LDLDIRECT in the last 72 hours.  Lab Results  Component Value Date   HGBA1C 12.9 (H) 04/25/2016   ------------------------------------------------------------------------------------------------------------------ No results for input(s): TSH, T4TOTAL, T3FREE, THYROIDAB in the last 72 hours.  Invalid input(s): FREET3 ------------------------------------------------------------------------------------------------------------------ No results for input(s): VITAMINB12, FOLATE,  FERRITIN, TIBC, IRON, RETICCTPCT in the last 72 hours.  Coagulation profile  Recent Labs Lab 04/30/16 0351 05/01/16 0501 05/02/16 0534  INR 1.20 1.24 1.33    No results for input(s): DDIMER in the last 72 hours.  Cardiac Enzymes No results for input(s): CKMB, TROPONINI, MYOGLOBIN in the last 168 hours.  Invalid input(s): CK ------------------------------------------------------------------------------------------------------------------ No results found for: BNP   Hanford Lust M.D on 05/02/2016 at 3:57 PM  Between 7am to 7pm - Pager - [phone]  After 7pm go to [email or web address] - password [email or web address]  [facility] -  Office  [phone]  Dragon dictation system was used to create this note, attempts have been made to correct errors, however presence of uncorrected errors is not a reflection quality of care provided

## 2016-05-02 NOTE — Telephone Encounter (Signed)
Call received from Carles Collet, RN CM requesting a hospital follow up appointment for the patient at [facility].  Informed her that there are no appointments available at the current time. The patient can call the clinic to check for cancellations at any time.  This CM will notify D. Swist, RN CM if an appointment becomes available prior to discharge.

## 2016-05-02 NOTE — Progress Notes (Signed)
Attempt to call daughter, no answer.

## 2016-05-02 NOTE — Discharge Instructions (Addendum)
Follow with Primary MD, Oncologist in 7 days.   On your next visit with your primary care physician please Get Medicines reviewed and adjusted.   Get copies of CT report and my DC summary printed before you leave the Hospital and show it to your PCP.  Please request your Prim.MD to go over all Hospital Tests and Procedure/Radiological results at the follow up, please get all Hospital records sent to your Prim MD by signing hospital release before you go home.   Get CBC, CMP, 2 view Chest X ray checked  by Primary MD or SNF MD in 5-7 days ( we routinely change or add medications that can affect your baseline labs and fluid status, therefore we recommend that you get the mentioned basic workup next visit with your PCP, your PCP may decide not to get them or add new tests based on their clinical decision)   Activity: As tolerated with Full fall precautions use walker/cane & assistance as needed  Diet:   Diet Carb Modified - Heart Healthy.  Accuchecks 4 times/day, Once in AM empty stomach and then before each meal. Log in all results and show them to your Prim.MD in 3 days. If any glucose reading is under 80 or above 300 call your Prim MD immidiately. Follow Low glucose instructions for glucose under 80 as instructed.   For Heart failure patients - Check your Weight same time everyday, if you gain over 2 pounds, or you develop in leg swelling, experience more shortness of breath or chest pain, call your Primary MD immediately. Follow Cardiac Low Salt Diet and 1.5 lit/day fluid restriction.  If you experience worsening of your admission symptoms, develop shortness of breath, life threatening emergency, suicidal or homicidal thoughts you must seek medical attention immediately by calling 911 or calling your MD immediately  if symptoms less severe.  You Must read complete instructions/literature along with all the possible adverse reactions/side effects for all the Medicines you take and that  have been prescribed to you. Take any new Medicines after you have completely understood and accpet all the possible adverse reactions/side effects.   Do not drive, operate heavy machinery, perform activities at heights, swimming or participation in water activities or provide baby sitting services if your were admitted for syncope or siezures until you have seen by Primary MD or a Neurologist and advised to do so again.  Do not drive when taking Pain medications.    Do not take more than prescribed Pain, Sleep and Anxiety Medications  Special Instructions: If you have smoked or chewed Tobacco  in the last 2 yrs please stop smoking, stop any regular Alcohol  and or any Recreational drug use.  Wear Seat belts while driving.   Please note  You were cared for by a hospitalist during your hospital stay. If you have any questions about your discharge medications or the care you received while you were in the hospital after you are discharged, you can call the unit and asked to speak with the hospitalist on call if the hospitalist that took care of you is not available. Once you are discharged, your primary care physician will handle any further medical issues. Please note that NO REFILLS for any discharge medications will be authorized once you are discharged, as it is imperative that you return to your primary care physician (or establish a relationship with a primary care physician if you do not have one) for your aftercare needs so that they can reassess your need  for medications and monitor your lab values.  ___________________________________________   Information on my medicine - ELIQUIS (apixaban)  This medication education was reviewed with me or my healthcare representative as part of my discharge preparation.    Why was Eliquis prescribed for you? Eliquis was prescribed to treat blood clots that may have been found in the veins of your legs (deep vein thrombosis) or in your lungs  (pulmonary embolism) and to reduce the risk of them occurring again.  What do You need to know about Eliquis ? The starting dose is 10 mg (two 5 mg tablets) taken TWICE daily for the FIRST SEVEN (7) DAYS, then on (enter date)  05/11/16  the dose is reduced to ONE 5 mg tablet taken TWICE daily.  Eliquis may be taken with or without food.   Try to take the dose about the same time in the morning and in the evening. If you have difficulty swallowing the tablet whole please discuss with your pharmacist how to take the medication safely.  Take Eliquis exactly as prescribed and DO NOT stop taking Eliquis without talking to the doctor who prescribed the medication.  Stopping may increase your risk of developing a new blood clot.  Refill your prescription before you run out.  After discharge, you should have regular check-up appointments with your healthcare provider that is prescribing your Eliquis.    What do you do if you miss a dose? If a dose of ELIQUIS is not taken at the scheduled time, take it as soon as possible on the same day and twice-daily administration should be resumed. The dose should not be doubled to make up for a missed dose.  Important Safety Information A possible side effect of Eliquis is bleeding. You should call your healthcare provider right away if you experience any of the following: ? Bleeding from an injury or your nose that does not stop. ? Unusual colored urine (red or dark brown) or unusual colored stools (red or black). ? Unusual bruising for unknown reasons. ? A serious fall or if you hit your head (even if there is no bleeding).  Some medicines may interact with Eliquis and might increase your risk of bleeding or clotting while on Eliquis. To help avoid this, consult your healthcare provider or pharmacist prior to using any new prescription or non-prescription medications, including herbals, vitamins, non-steroidal anti-inflammatory drugs (NSAIDs) and  supplements.  This website has more information on Eliquis (apixaban): http://www.eliquis.com/eliquis/home

## 2016-05-02 NOTE — Progress Notes (Signed)
ANTICOAGULATION CONSULT NOTE - follow up  Pharmacy Consult for Lovenox >> Coumadin Indication: pulmonary embolus /DVT RLE  Allergies  Allergen Reactions  . Dust Mite Extract     Patient Measurements: Height: 5\' 3"  (160 cm) Weight: 174 lb 6.4 oz (79.1 kg) IBW/kg (Calculated) : 52.4  Addendum: re-weighed 04/27/16 = 79.1 kg    Vital Signs: Temp: 98.8 F (37.1 C) (01/22 0508) Temp Source: Oral (01/22 0508) BP: 129/79 (01/22 0508) Pulse Rate: 102 (01/22 0508)  Labs:  Recent Labs  04/30/16 0351 05/01/16 0501 05/02/16 0534  HGB 7.7* 7.6* 7.7*  HCT 23.9* 23.9* 24.4*  PLT 205 213 217  LABPROT 15.2 15.7* 16.6*  INR 1.20 1.24 1.33  CREATININE 0.76  --  0.72    Estimated Creatinine Clearance: 68 mL/min (by C-G formula based on SCr of 0.72 mg/dL).   Medical History: Past Medical History:  Diagnosis Date  . Cancer (HCC)    breast  . CHF (congestive heart failure) ([place])   . Diabetes mellitus without complication ([place])   . Renal disorder     Assessment: 66 yo female with new/acute saddle pulmonary embolus in setting of breast cancer. Initial plan was to transition from lovenox to NOAC but due to insurance issues decision was made to transition pt to coumadin that was started on 1/19.  INR remains subtherapeutic but trending up.  Hgb 7.7 low but stable, PLTc 217.  Goal of Therapy:  Anti-Xa level 0.6-1 units/ml 4hrs after LMWH dose given Monitor platelets by anticoagulation protocol: Yes  INR: 2 -3    Plan:  1. Continue Lovenox 80 mg sq Q 12 hours 2. Repeat Coumadin 7.5 mg po today at 1800 pm 3. Daily INR 4. Coumadin education  [place], Pharm.D., BCPS Clinical Pharmacist Pager [phone] 05/02/2016 11:05 AM

## 2016-05-02 NOTE — Progress Notes (Signed)
Inpatient Diabetes Program Recommendations  AACE/ADA: New Consensus Statement on Inpatient Glycemic Control (2015)  Target Ranges:  Prepandial:   less than 140 mg/dL      Peak postprandial:   less than 180 mg/dL (1-2 hours)      Critically ill patients:  140 - 180 mg/dL   Lab Results  Component Value Date   GLUCAP 87 05/02/2016   HGBA1C 12.9 (H) 04/25/2016   Results for AILANIE, HOWMAN (MRN [id number]) as of 05/02/2016 11:44  Ref. Range 05/01/2016 12:46 05/01/2016 16:26 05/01/2016 20:36 05/02/2016 00:55 05/02/2016 02:33 05/02/2016 03:53 05/02/2016 05:06 05/02/2016 08:06  Glucose-Capillary Latest Ref Range: 65 - 99 mg/dL 112 (H) 139 (H) 94 69 88 65 77 87   Review of Glycemic Control  Current orders for Inpatient glycemic control:     Glipizide 5 mg BID    Metformin 1000 mg BID    Novolog 0-9 units Q4H  Inpatient Diabetes Program Recommendations:    Please consider changing Correction SSI schedule to Sensitive Novolog 0-9 units TID with meals and 0-5 units QHS (and no coverage at 0400).    While inpatient, please consider holding oral DM medications of Metformin and Glipizide to improve glycemic control.  Thank you,  Windy Carina, RN, MSN Diabetes Coordinator Inpatient Diabetes Program [phone] (Team Pager)

## 2016-05-03 ENCOUNTER — Inpatient Hospital Stay: Payer: Self-pay | Admitting: Physician Assistant

## 2016-05-03 ENCOUNTER — Telehealth: Payer: Self-pay

## 2016-05-03 DIAGNOSIS — I82401 Acute embolism and thrombosis of unspecified deep veins of right lower extremity: Secondary | ICD-10-CM

## 2016-05-03 DIAGNOSIS — I509 Heart failure, unspecified: Secondary | ICD-10-CM | POA: Diagnosis not present

## 2016-05-03 DIAGNOSIS — E1122 Type 2 diabetes mellitus with diabetic chronic kidney disease: Secondary | ICD-10-CM | POA: Diagnosis not present

## 2016-05-03 DIAGNOSIS — I130 Hypertensive heart and chronic kidney disease with heart failure and stage 1 through stage 4 chronic kidney disease, or unspecified chronic kidney disease: Secondary | ICD-10-CM | POA: Diagnosis not present

## 2016-05-03 DIAGNOSIS — I639 Cerebral infarction, unspecified: Secondary | ICD-10-CM | POA: Diagnosis not present

## 2016-05-03 DIAGNOSIS — E871 Hypo-osmolality and hyponatremia: Secondary | ICD-10-CM | POA: Diagnosis not present

## 2016-05-03 DIAGNOSIS — I2602 Saddle embolus of pulmonary artery with acute cor pulmonale: Secondary | ICD-10-CM | POA: Diagnosis not present

## 2016-05-03 LAB — GLUCOSE, CAPILLARY
GLUCOSE-CAPILLARY: 111 mg/dL — AB (ref 65–99)
Glucose-Capillary: 82 mg/dL (ref 65–99)

## 2016-05-03 LAB — CBC
HCT: 26.1 % — ABNORMAL LOW (ref 36.0–46.0)
HEMOGLOBIN: 8.3 g/dL — AB (ref 12.0–15.0)
MCH: 30.4 pg (ref 26.0–34.0)
MCHC: 31.8 g/dL (ref 30.0–36.0)
MCV: 95.6 fL (ref 78.0–100.0)
Platelets: 233 10*3/uL (ref 150–400)
RBC: 2.73 MIL/uL — AB (ref 3.87–5.11)
RDW: 18.4 % — ABNORMAL HIGH (ref 11.5–15.5)
WBC: 9.2 10*3/uL (ref 4.0–10.5)

## 2016-05-03 MED ORDER — FREESTYLE SYSTEM KIT: 1.0000 | PACK | Freq: Three times a day (TID) | 1 refills | Status: AC

## 2016-05-03 MED ORDER — LIVING WELL WITH DIABETES BOOK
Freq: Once | Status: AC
  Administered 2016-05-03: 13:00:00
  Filled 2016-05-03: qty 1

## 2016-05-03 MED ORDER — APIXABAN 5 MG PO TABS: 5.0000 mg | ORAL_TABLET | Freq: Two times a day (BID) | ORAL | 0 refills | Status: DC

## 2016-05-03 MED ORDER — APIXABAN 5 MG PO TABS
10.0000 mg | ORAL_TABLET | Freq: Two times a day (BID) | ORAL | Status: DC
  Administered 2016-05-03: 10 mg via ORAL
  Filled 2016-05-03: qty 2

## 2016-05-03 MED ORDER — INSULIN ASPART 100 UNIT/ML ~~LOC~~ SOLN: 1.0000 [IU] | Freq: Two times a day (BID) | SUBCUTANEOUS | Status: DC

## 2016-05-03 MED ORDER — METFORMIN HCL 1000 MG PO TABS: 1000.0000 mg | ORAL_TABLET | Freq: Two times a day (BID) | ORAL | 0 refills | Status: DC

## 2016-05-03 MED ORDER — APIXABAN 5 MG PO TABS: 10.0000 mg | ORAL_TABLET | Freq: Two times a day (BID) | ORAL | 0 refills | Status: DC

## 2016-05-03 MED ORDER — GLIPIZIDE 5 MG PO TABS: 5.0000 mg | ORAL_TABLET | Freq: Two times a day (BID) | ORAL | 0 refills | Status: DC

## 2016-05-03 MED ORDER — ATORVASTATIN CALCIUM 20 MG PO TABS: 20.0000 mg | ORAL_TABLET | Freq: Every day | ORAL | 0 refills | Status: DC

## 2016-05-03 MED ORDER — APIXABAN 5 MG PO TABS: 5.0000 mg | ORAL_TABLET | Freq: Two times a day (BID) | ORAL | Status: DC

## 2016-05-03 NOTE — Discharge Summary (Addendum)
Carrie Watts DYJ:[id number] DOB: [birth year] DOA: 04/24/2016  PCP: No primary care provider on file.  Admit date: 04/24/2016  Discharge date: 05/03/2016  Admitted From: Home   Disposition:  Home   Recommendations for Outpatient Follow-up:   Follow up with PCP in 1-2 weeks  PCP Please obtain BMP/CBC, 2 view CXR in 1week,  (see Discharge instructions)   PCP Please follow up on the following pending results: None   Home Health:  PT, Aide, S Work  Equipment/Devices: Environmental consultant  Consultations: Dr Jana Hakim over the phone Discharge Condition: Fair   CODE STATUS: Full   Diet Recommendation:  Heart Healthy    Chief Complaint  Patient presents with  . difficulty speaking     Brief history of present illness from the day of admission and additional interim summary    66 year old female with history of diabetes mellitus, left breast cancer status post lumpectomy 6 months ago, now on adjuvant chemotherapy in [place] and incidental to be cancer free, who recently flew back from [place] presented with right-sided facial droop with difficulty speaking, chest discomfort, cough and shortness of breath. She was found to have large saddle PE along with lower extremity DVT. Stroke was ruled out.  Hospital issues addressed     1.Saddle PE with mild right heart strain, Rt. lower extremity DVT - likely due to recent air travel and history of breast cancer, she was anticoagulated with Lovenox while in hospital, now symptom-free, on room air, no chest pain, right leg swelling almost completely resolved. Will be placed on liquids, case management to provide one-month supply. She will most likely fly back to her home country soon however if she stays here she will follow with local free clinic within the next 2-3 weeks. Note if she stays in  [place] future anticoagulation will have to be arranged for.  2. Questionable strokelike symptoms upon admission. Ruled out for stroke, MRI/MRA brain unremarkable, echo with bubble study stable, carotid duplex nonacute. She will be placed on aspirin and statin per neurology. She was seen by neurology this hospital admission . All symptoms have resolved.  Lab Results  Component Value Date   HGBA1C 12.9 (H) 04/25/2016   Lipid Panel     Component Value Date/Time   CHOL 196 04/25/2016 0426   TRIG 154 (H) 04/25/2016 0426   HDL 38 (L) 04/25/2016 0426   CHOLHDL 5.2 04/25/2016 0426   VLDL 31 04/25/2016 0426   LDLCALC 127 (H) 04/25/2016 0426    3.Newly diagnosed DM type II. Placed on Glucophage along with Glucotrol, testing supplies provided, diabetic education prior to discharge. She has been counseled to do Accu-Cheks every before meals at bedtime and present the log book to PCP next visit.  4. History of left-sided breast cancer status post mastectomy 6 months ago undergoing adjuvant chemotherapy in [place]. She is thought to be cancer free, she is supposed to be getting 3 more cycles of chemotherapy, if she stays in [place] I have recommended local oncologist for follow-up. Questionable nodule on CT chest  was noted this admission. She will require repeat CT scan in 3 months. Have requested the patient to take all the copies of her CT scan along with my discharge summary to her PCP entered at [place] next visit.  5.AOCD - No acute issues, no transfusions needed. Follow with PCP.   Discharge diagnosis     Principal Problem:   Saddle pulmonary embolus (HCC) Active Problems:   DM2 (diabetes mellitus, type 2) (HCC)   Stroke-like symptoms   Acute saddle pulmonary embolism with acute cor pulmonale (HCC)   History of cancer of left breast   Pressure injury of skin   Acute deep vein thrombosis (DVT) of right lower extremity ([place])   Hyperlipidemia    Discharge instructions    Discharge  Instructions    DME Nebulizer machine    Complete by:  As directed    Patient needs a nebulizer to treat with the following condition:  Dyspnea and respiratory abnormalities   DME Nebulizer/meds    Complete by:  As directed    Patient needs a nebulizer to treat with the following condition:  Dyspnea and respiratory abnormalities   Discharge instructions    Complete by:  As directed    Follow with Primary MD, Oncologist in 7 days.   On your next visit with your primary care physician please Get Medicines reviewed and adjusted.   Get copies of CT report and my DC summary printed before you leave the Hospital and show it to your PCP.  Please request your Prim.MD to go over all Hospital Tests and Procedure/Radiological results at the follow up, please get all Hospital records sent to your Prim MD by signing hospital release before you go home.   Get CBC, CMP, 2 view Chest X ray checked  by Primary MD or SNF MD in 5-7 days ( we routinely change or add medications that can affect your baseline labs and fluid status, therefore we recommend that you get the mentioned basic workup next visit with your PCP, your PCP may decide not to get them or add new tests based on their clinical decision)   Activity: As tolerated with Full fall precautions use walker/cane & assistance as needed  Diet:   Diet Carb Modified - Heart Healthy.  Accuchecks 4 times/day, Once in AM empty stomach and then before each meal. Log in all results and show them to your Prim.MD in 3 days. If any glucose reading is under 80 or above 300 call your Prim MD immidiately. Follow Low glucose instructions for glucose under 80 as instructed.   For Heart failure patients - Check your Weight same time everyday, if you gain over 2 pounds, or you develop in leg swelling, experience more shortness of breath or chest pain, call your Primary MD immediately. Follow Cardiac Low Salt Diet and 1.5 lit/day fluid restriction.  If you  experience worsening of your admission symptoms, develop shortness of breath, life threatening emergency, suicidal or homicidal thoughts you must seek medical attention immediately by calling 911 or calling your MD immediately  if symptoms less severe.  You Must read complete instructions/literature along with all the possible adverse reactions/side effects for all the Medicines you take and that have been prescribed to you. Take any new Medicines after you have completely understood and accpet all the possible adverse reactions/side effects.   Do not drive, operate heavy machinery, perform activities at heights, swimming or participation in water activities or provide baby sitting services if your were admitted for syncope  or siezures until you have seen by Primary MD or a Neurologist and advised to do so again.  Do not drive when taking Pain medications.    Do not take more than prescribed Pain, Sleep and Anxiety Medications  Special Instructions: If you have smoked or chewed Tobacco  in the last 2 yrs please stop smoking, stop any regular Alcohol  and or any Recreational drug use.  Wear Seat belts while driving.   Please note  You were cared for by a hospitalist during your hospital stay. If you have any questions about your discharge medications or the care you received while you were in the hospital after you are discharged, you can call the unit and asked to speak with the hospitalist on call if the hospitalist that took care of you is not available. Once you are discharged, your primary care physician will handle any further medical issues. Please note that NO REFILLS for any discharge medications will be authorized once you are discharged, as it is imperative that you return to your primary care physician (or establish a relationship with a primary care physician if you do not have one) for your aftercare needs so that they can reassess your need for medications and monitor your lab values.    Increase activity slowly    Complete by:  As directed       Discharge Medications   Allergies as of 05/03/2016      Reactions   Dust Mite Extract       Medication List    TAKE these medications   apixaban 5 MG Tabs tablet Commonly known as:  ELIQUIS Take 2 tablets (10 mg total) by mouth 2 (two) times daily.   apixaban 5 MG Tabs tablet Commonly known as:  ELIQUIS Take 1 tablet (5 mg total) by mouth 2 (two) times daily. Start taking on:  05/11/2016   aspirin EC 81 MG tablet Take 81 mg by mouth daily.   atorvastatin 20 MG tablet Commonly known as:  LIPITOR Take 1 tablet (20 mg total) by mouth at bedtime. Can switch to a cheaper statin if needed   folic acid 161 MCG tablet Commonly known as:  FOLVITE Take 800 mcg by mouth daily.   glipiZIDE 5 MG tablet Commonly known as:  GLUCOTROL Take 1 tablet (5 mg total) by mouth 2 (two) times daily before a meal.   glucose monitoring kit monitoring kit 1 each by Does not apply route 4 (four) times daily - after meals and at bedtime. 1 month Diabetic Testing Supplies for QAC-QHS accuchecks.  Diagnosis E11.65. Any brand OK   metFORMIN 1000 MG tablet Commonly known as:  GLUCOPHAGE Take 1 tablet (1,000 mg total) by mouth 2 (two) times daily with a meal.   methyldopa 250 MG tablet Commonly known as:  ALDOMET Take 250 mg by mouth 2 (two) times daily.   omeprazole 20 MG capsule Commonly known as:  PRILOSEC Take 20 mg by mouth daily.   penicillin v potassium 500 MG tablet Commonly known as:  VEETID Take 500 mg by mouth.   ranitidine 300 MG tablet Commonly known as:  ZANTAC Take 300 mg by mouth at bedtime.            Durable Medical Equipment        Start     Ordered   05/02/16 1631  For home use only DME Nebulizer/meds  Once    Question:  Patient needs a nebulizer to treat with the following condition  Answer:  Dyspnea and respiratory abnormalities   05/02/16 1630   05/02/16 0000  DME Nebulizer machine      Question:  Patient needs a nebulizer to treat with the following condition  Answer:  Dyspnea and respiratory abnormalities   05/02/16 1629   05/02/16 0000  DME Nebulizer/meds    Question:  Patient needs a nebulizer to treat with the following condition  Answer:  Dyspnea and respiratory abnormalities   05/02/16 1629      Follow-up Information    [facility]. Go on 05/23/2016.   Why:  at 2pm to establish primary care. They will manage blood thinners after DC.  Contact information: [place] [id number]       Medicare Enrollment Follow up.   Contact information: You may visit the [employer], visit the local Social Security office, or call [phone] to establish Medicare.        Rulon Eisenmenger, MD. Schedule an appointment as soon as possible for a visit in 1 week(s).   Specialty:  Hematology and Oncology Why:  breast cancer Contact information: [place] [address] [phone]        Consuella Lose, C, MD. Schedule an appointment as soon as possible for a visit in 1 week(s).   Specialty:  Neurosurgery Why:  brain anuerysm Contact information: [address] [place] [address] [place]. Go on 05/05/2016.   Why:  at 10:00am for a hospital follow up appointment.  Contact information: [address] [place] [address] [phone]          Major procedures and Radiology Reports - PLEASE review detailed and final reports thoroughly  -       TTE  - Left ventricle: The cavity size was normal. Wall thickness was   normal. Systolic function was normal. The estimated ejection   fraction was in the range of 55% to 60%. Wall motion was normal;   there were no regional wall motion abnormalities. Doppler   parameters are consistent with abnormal left ventricular   relaxation (grade 1  diastolic dysfunction). - Aortic valve: There was no stenosis. - Mitral valve: There was trivial regurgitation. - Right ventricle: The cavity size was normal. Systolic function   was normal. - Atrial septum: No defect or patent foramen ovale was identified.   Echo contrast study showed no right-to-left atrial level shunt,   at baseline or with provocation. - Tricuspid valve: Peak RV-RA gradient (S): 35 mm Hg. - Pulmonary arteries: PA peak pressure: 38 mm Hg (S). - Inferior vena cava: The vessel was normal in size. The   respirophasic diameter changes were in the normal range (>= 50%),   consistent with normal central venous pressure.  Impressions:  - Normal LV size and systolic function, EF 50-93%. Normal RV size and systolic function. No significant valvular abnormalities.   Negative bubble study.   Leg US - Findings consistent with acute deep vein thrombosis involving the   right saphenofemoral junction, right femoral vein, right   popliteal vein, right posterial tibial vein, and right peroneal   vein. - No evidence of deep vein thrombosis involving the left lower   extremity. - No evidence of Baker&'s cyst on the right or left.  Carotid US  Findings suggest 1-39% internal carotid artery stenosis bilaterally. Vertebral arteries are patent with antegrade flow.  Dg Chest 2 View  Result Date: 04/24/2016 CLINICAL DATA:  Cough. History of hypertension and CHF. History of breast cancer. Patient has had loss of function on the right side over body and has difficulty speaking. EXAM: CHEST  2 VIEW COMPARISON:  None. FINDINGS: Normal heart size and pulmonary vascularity. No focal airspace disease or consolidation in the lungs. No blunting of costophrenic angles. No pneumothorax. Mediastinal contours appear intact. Surgical clips in the left axilla. Degenerative changes in the spine. IMPRESSION: No active cardiopulmonary disease. Electronically Signed   By: Lucienne Capers M.D.    On: 04/24/2016 01:33   Ct Head Wo Contrast  Result Date: 04/24/2016 CLINICAL DATA:  Acute onset of slurred speech and bilateral leg weakness. Initial encounter. EXAM: CT HEAD WITHOUT CONTRAST TECHNIQUE: Contiguous axial images were obtained from the base of the skull through the vertex without intravenous contrast. COMPARISON:  None. FINDINGS: Brain: No evidence of acute infarction, hemorrhage, hydrocephalus, extra-axial collection or mass lesion/mass effect. Prominence of the ventricles and sulci reflects mild cortical volume loss. Scattered periventricular subcortical white matter change likely reflects small vessel ischemic microangiopathy. A small chronic lacunar infarct is noted at the right basal ganglia. The brainstem and fourth ventricle are within normal limits. The cerebral hemispheres demonstrate grossly normal gray-white differentiation. No mass effect or midline shift is seen. Vascular: No hyperdense vessel or unexpected calcification. Skull: There is no evidence of fracture; visualized osseous structures are unremarkable in appearance. Sinuses/Orbits: The orbits are within normal limits. The paranasal sinuses and mastoid air cells are well-aerated. Other: No significant soft tissue abnormalities are seen. IMPRESSION: 1. No acute intracranial pathology seen on CT. 2. Mild cortical volume loss and scattered small vessel ischemic microangiopathy. 3. Small chronic lacunar infarct at the right basal ganglia. Electronically Signed   By: Garald Balding M.D.   On: 04/24/2016 01:54   Ct Angio Chest Pe W And/or Wo Contrast  Result Date: 04/24/2016 CLINICAL DATA:  Cough, chest pain, and shortness of breath for 2 weeks. History of breast cancer post left mastectomy. Chemotherapy. Multiple recent strokes. Diabetes. EXAM: CT ANGIOGRAPHY CHEST WITH CONTRAST TECHNIQUE: Multidetector CT imaging of the chest was performed using the standard protocol during bolus administration of intravenous contrast.  Multiplanar CT image reconstructions and MIPs were obtained to evaluate the vascular anatomy. CONTRAST:  100 mL Isovue 370 COMPARISON:  None. FINDINGS: Cardiovascular: Filling defects demonstrated in the main and bilateral lobar pulmonary artery is extending into bilateral upper and lower lobe branches consistent with saddle embolus with moderately large clot burden. The RV to LV ratio is elevated at 1.19, suggesting risk of right heart strain. No pericardial effusion. Dilated ascending thoracic aorta at 3.5 cm. No dissection. Scattered calcification. Mediastinum/Nodes: Small esophageal hiatal hernia. Esophagus is decompressed. No significant lymphadenopathy in the chest. Lungs/Pleura: Evaluation is limited due to motion artifact. There patchy nodular ground-glass infiltrative changes demonstrated centrally in the lungs, in the lung bases, and scattered throughout the upper lungs. This may be due to multifocal pneumonia, edema, or infarcts. Metastatic disease would be a less likely consideration. No pleural effusions. No pneumothorax. Upper Abdomen: No acute abnormality. Musculoskeletal: Postoperative changes consistent with left mastectomy and surgical clips in the left axilla. No destructive bone lesions. Review of the MIP images confirms the above findings. IMPRESSION: Positive examination for pulmonary embolus, including saddle embolus and bilateral lobar and segmental pulmonary emboli. Moderately large clot burden. Positive for acute PE with CT evidence of right heart strain (RV/LV Ratio = 1.19) consistent with at  least submassive (intermediate risk) PE. The presence of right heart strain has been associated with an increased risk of morbidity and mortality. Please activate Code PE by paging [phone]. Patchy nodular ground-glass infiltrative changes in the lungs may be due to multifocal pneumonia, edema, or infarcts. Metastatic disease would be a less likely consideration but due to history of primary  carcinoma, non-contrast chest CT at 3-6 months is recommended. If nodules persist, subsequent management will be based upon the most suspicious nodule(s). This recommendation follows the consensus statement: Guidelines for Management of Incidental Pulmonary Nodules Detected on CT Images: From the Fleischner Society 2017; Radiology 2017; 284:228-243. These results were called by telephone at the time of interpretation on 04/24/2016 at 4:26 am to Dr. Deno Etienne , who verbally acknowledged these results. Electronically Signed   By: Lucienne Capers M.D.   On: 04/24/2016 04:32   Mr Brain Wo Contrast  Result Date: 04/24/2016 CLINICAL DATA:  Right-sided facial droop and difficulty speaking, symptoms for 1 week. History of breast cancer currently on chemotherapy. Large saddle embolus on CT chest. Query paradoxical emboli. History of diabetes. EXAM: MRI HEAD WITHOUT CONTRAST MRA HEAD WITHOUT CONTRAST TECHNIQUE: Multiplanar, multiecho pulse sequences of the brain and surrounding structures were obtained without intravenous contrast. Angiographic images of the head were obtained using MRA technique without contrast. COMPARISON:  CT chest 04/24/2016.  CT head 04/24/2016. FINDINGS: MRI HEAD FINDINGS Brain: No evidence for acute infarction, hemorrhage, mass lesion, hydrocephalus, or extra-axial fluid. Moderate atrophy. Advanced T2 and FLAIR hyperintensities throughout the white matter, representing either diabetic related chronic microvascular ischemic change or post treatment effect from chemotherapy. Scattered areas of lacunar infarction in the deep nuclei and subcortical white matter. Brainstem and cerebellum spared. 9 mm of tonsillar descent below the foramen magnum consistent with Chiari I malformation. No visible hydromyelia. Partial empty sella. Vascular: Normal flow voids. Skull and upper cervical spine: Normal marrow signal. Large disc extrusion at C3-4, evaluated only on sagittal images, causing significant cord  compression. Consider cervical spine MRI for further evaluation. Sinuses/Orbits: Negative. Other: None. MRA HEAD FINDINGS The internal carotid arteries are widely patent. Basilar artery is widely patent with vertebrals codominant. There is no intracranial stenosis or visible aneurysm. IMPRESSION: Moderate atrophy with advanced nonacute white matter signal abnormality. No visible acute stroke. Chiari I malformation. 9 mm tonsillar descent without visible hydromyelia. Within limits for assessment on noncontrast brain, no intracranial metastatic disease or visible osseous disease. No intracranial stenosis or occlusion. Electronically Signed   By: Staci Righter M.D.   On: 04/24/2016 13:34   Mr Jodene Nam Head/brain UG Cm  Result Date: 04/24/2016 CLINICAL DATA:  Right-sided facial droop and difficulty speaking, symptoms for 1 week. History of breast cancer currently on chemotherapy. Large saddle embolus on CT chest. Query paradoxical emboli. History of diabetes. EXAM: MRI HEAD WITHOUT CONTRAST MRA HEAD WITHOUT CONTRAST TECHNIQUE: Multiplanar, multiecho pulse sequences of the brain and surrounding structures were obtained without intravenous contrast. Angiographic images of the head were obtained using MRA technique without contrast. COMPARISON:  CT chest 04/24/2016.  CT head 04/24/2016. FINDINGS: MRI HEAD FINDINGS Brain: No evidence for acute infarction, hemorrhage, mass lesion, hydrocephalus, or extra-axial fluid. Moderate atrophy. Advanced T2 and FLAIR hyperintensities throughout the white matter, representing either diabetic related chronic microvascular ischemic change or post treatment effect from chemotherapy. Scattered areas of lacunar infarction in the deep nuclei and subcortical white matter. Brainstem and cerebellum spared. 9 mm of tonsillar descent below the foramen magnum consistent with Chiari I malformation. No  visible hydromyelia. Partial empty sella. Vascular: Normal flow voids. Skull and upper cervical  spine: Normal marrow signal. Large disc extrusion at C3-4, evaluated only on sagittal images, causing significant cord compression. Consider cervical spine MRI for further evaluation. Sinuses/Orbits: Negative. Other: None. MRA HEAD FINDINGS The internal carotid arteries are widely patent. Basilar artery is widely patent with vertebrals codominant. There is no intracranial stenosis or visible aneurysm. IMPRESSION: Moderate atrophy with advanced nonacute white matter signal abnormality. No visible acute stroke. Chiari I malformation. 9 mm tonsillar descent without visible hydromyelia. Within limits for assessment on noncontrast brain, no intracranial metastatic disease or visible osseous disease. No intracranial stenosis or occlusion. Electronically Signed   By: Staci Righter M.D.   On: 04/24/2016 13:34    Micro Results    Recent Results (from the past 240 hour(s))  MRSA PCR Screening     Status: None   Collection Time: 04/24/16  6:31 AM  Result Value Ref Range Status   MRSA by PCR NEGATIVE NEGATIVE Final    Comment:        The GeneXpert MRSA Assay (FDA approved for NASAL specimens only), is one component of a comprehensive MRSA colonization surveillance program. It is not intended to diagnose MRSA infection nor to guide or monitor treatment for MRSA infections.   C difficile quick scan w PCR reflex     Status: None   Collection Time: 05/02/16  9:44 AM  Result Value Ref Range Status   C Diff antigen NEGATIVE NEGATIVE Final   C Diff toxin NEGATIVE NEGATIVE Final   C Diff interpretation No C. difficile detected.  Final    Comment: NEGATIVE    Today   Subjective    Carrie Watts today has no headache,no chest abdominal pain,no new weakness tingling or numbness, feels much better wants to go home today.    Objective   Blood pressure 114/69, pulse 93, temperature 99.1 F (37.3 C), temperature source Oral, resp. rate 18, height _0  (1.6 m), weight 79.1 kg (174 lb 6.4 oz), SpO2 100  %.  No intake or output data in the 24 hours ending 05/03/16 1055  Exam Awake Alert, Oriented x 3, No new F.N deficits, Normal affect [place].AT,PERRAL Supple Neck,No JVD, No cervical lymphadenopathy appriciated.  Symmetrical Chest wall movement, Good air movement bilaterally, CTAB RRR,No Gallops,Rubs or new Murmurs, No Parasternal Heave +ve B.Sounds, Abd Soft, Non tender, No organomegaly appriciated, No rebound -guarding or rigidity. No Cyanosis, Clubbing or edema, No new Rash or bruise   Data Review   CBC w Diff:  Lab Results  Component Value Date   WBC 9.2 05/03/2016   HGB 8.3 (L) 05/03/2016   HCT 26.1 (L) 05/03/2016   PLT 233 05/03/2016   LYMPHOPCT 16 04/24/2016   MONOPCT 11 04/24/2016   EOSPCT 1 04/24/2016   BASOPCT 0 04/24/2016    CMP:  Lab Results  Component Value Date   NA 137 05/02/2016   K 3.9 05/02/2016   CL 105 05/02/2016   CO2 21 (L) 05/02/2016   BUN 8 05/02/2016   CREATININE 0.83 05/02/2016   PROT 6.7 05/02/2016   ALBUMIN 2.5 (L) 05/02/2016   BILITOT 0.4 05/02/2016   ALKPHOS 73 05/02/2016   AST 49 (H) 05/02/2016   ALT 30 05/02/2016  .   Total Time in preparing paper work, data evaluation and todays exam - 35 minutes  Thurnell Lose M.D on 05/03/2016 at 10:55 AM  [facility]   Office  [phone]

## 2016-05-03 NOTE — Progress Notes (Addendum)
Inpatient Diabetes Program Recommendations  AACE/ADA: New Consensus Statement on Inpatient Glycemic Control (2015)  Target Ranges:  Prepandial:   less than 140 mg/dL      Peak postprandial:   less than 180 mg/dL (1-2 hours)      Critically ill patients:  140 - 180 mg/dL   Lab Results  Component Value Date   GLUCAP 111 (H) 05/03/2016   HGBA1C 12.9 (H) 04/25/2016    Review of Glycemic ControlResults for SAHER, HICKEL (MRN [id number]) as of 05/03/2016 12:55  Ref. Range 05/02/2016 08:06 05/02/2016 12:38 05/02/2016 17:04 05/02/2016 20:52 05/03/2016 08:03  Glucose-Capillary Latest Ref Range: 65 - 99 mg/dL 87 65 77 80 111 (H)    Diabetes history: New onset diabetes Outpatient Diabetes medications: None Current orders for Inpatient glycemic control:  Novolog correction starting at 201 mg/dL tid bid, Glipizide 5 mg bid, Metformin 1000 mg bid  Inpatient Diabetes Program:  Referral received regarding patient with new onset diabetes.  Per patient she has never had diagnosis of diabetes.  Discussed elevated A1C.  Asked patient if she has been on steroid therapy prior to admit, and she states no.  Briefly discussed that daughter would be assisting with checking blood sugars and giving diabetes medications.  Told patient I would be ordering diabetes teaching booklet as well.  Patient asked me to call daughter to discuss further.  Called and spoke with daughter by phone.  Discussed glucose meter from [employer] and cost.  Reviewed A1C and d/c on Oral medications.  Discussed 15/15 rule with daughter including how to treat low blood sugars.  Also discussed if blood sugars <100 mg/dL, may need to discuss with MD if medications need to be changed.  Patient has follow-up appointment at [facility] on 05/05/16.  Daughter appreciative of information. She did ask about diet.  I recommended elimination of sugar from beverages.  She states that her mother has also complained of tingling in her fingers and wonders if this could be  neuropathy from diabetes.  Encouraged her to discuss with MD.  Daughter and patient have no further questions at this time but patient will need close follow-up.  Thanks, Adah Perl, RN, BC-ADM Inpatient Diabetes Coordinator Pager [phone] (8a-5p)

## 2016-05-03 NOTE — Progress Notes (Signed)
Pt and her daughter given discharge instructions, prescriptions, and care notes. Pt verbalized understanding AEB no further questions or concerns at this time. IV was discontinued, no redness, pain, or swelling noted at this time. Telemetry discontinued and Centralized Telemetry was notified. Pt left the floor via wheelchair with staff in stable condition.

## 2016-05-03 NOTE — Progress Notes (Signed)
Occupational Therapy Treatment Patient Details Name: Carrie Watts MRN: [id number] DOB: [birth year] Today's Date: 05/03/2016    History of present illness 66 y/o female came to the [facility] ED on 1/14 with cough, dyspnea and chest pain in the setting of two weeks of R sided facial droop and slurred speech after returning from [place] on 1/13. Noted to have a large PE on CT angiogram. MRI and MRA negative.   OT comments  Pt esaily fatigues. Desat to low 80s on RA after walking to toilet (@ 15 ft) 3/4 dyspnea. Complains of feeling "foggy headed". Nsg notified. Pt progressing however, continue to recommend rehab at SNF prior to DC home. Pt apparently being DC home and will need 24/7 assistance with mobility and ADL. Pt will need RW and 3in1 for safe DC home due to fall risk and inability to stand from regular height toilet. Unsure if pt has 24/7 S. Will continue to follow acutely.   Follow Up Recommendations  Home health OT;Supervision/Assistance - 24 hour    Equipment Recommendations  3 in 1 bedside commode;Other (comment) (RW)    Recommendations for Other Services      Precautions / Restrictions Precautions Precautions: Fall Precaution Comments: SOB and tachycardia with activity Restrictions Weight Bearing Restrictions: No       Mobility Bed Mobility               General bed mobility comments: OOB in chair  Transfers Overall transfer level: Needs assistance Equipment used: Rolling walker (2 wheeled) Transfers: Sit to/from Stand Sit to Stand: Min guard Stand pivot transfers: Min guard       General transfer comment: mod A to stand from toilet.    Balance Overall balance assessment: Needs assistance Sitting-balance support: Feet supported Sitting balance-Leahy Scale: Fair     Standing balance support: Bilateral upper extremity supported;During functional activity;Single extremity supported Standing balance-Leahy Scale: Poor Standing balance comment: single UE  support during standing activity at sink.                    ADL Overall ADL's : Needs assistance/impaired     Grooming: Set up;Standing       Lower Body Bathing: Minimal assistance;Sit to/from stand       Lower Body Dressing: Minimal assistance;Sit to/from stand   Toilet Transfer: Moderate assistance Toilet Transfer Details (indicate cue type and reason): heavy use of grab bars adn RW. unable to stand from standard height toilet Toileting- Clothing Manipulation and Hygiene: Set up       Functional mobility during ADLs: Minimal assistance;Rolling walker General ADL Comments: SOB wtih minimal activity. Desat to 85 on RA with walking to toilet. 3/4 dyspnea Will need BSC and RW.. Began educating on energy conservation.      Vision                     Perception     Praxis      Cognition   Behavior During Therapy: [facility] for tasks assessed/performed Overall Cognitive Status: Within Functional Limits for tasks assessed                  General Comments: Language barrier but does speak english.      Extremity/Trunk Assessment     generalized weakness          Exercises     Shoulder Instructions       General Comments      Pertinent Vitals/ Pain  Pain Assessment: Faces Faces Pain Scale: Hurts even more Pain Location: chest when taking deep breaths Pain Descriptors / Indicators: Discomfort;Grimacing Pain Intervention(s): Limited activity within patient's tolerance;Monitored during session  O2 desat to 80 after walking to toilet on RA  Home Living                                          Prior Functioning/Environment              Frequency  Min 3X/week        Progress Toward Goals  OT Goals(current goals can now be found in the care plan section)  Progress towards OT goals: Progressing toward goals  Acute Rehab OT Goals Patient Stated Goal: to go home with daughter. OT Goal Formulation: With  patient Time For Goal Achievement: 05/10/16 Potential to Achieve Goals: Good ADL Goals Pt Will Perform Grooming: with supervision;standing Pt Will Perform Upper Body Dressing: with modified independence;sitting Pt Will Perform Lower Body Dressing: with supervision;sit to/from stand Pt Will Transfer to Toilet: with supervision;bedside commode;ambulating Pt Will Perform Toileting - Clothing Manipulation and hygiene: with supervision;sit to/from stand Pt Will Perform Tub/Shower Transfer: Tub transfer;Shower transfer;with supervision;3 in Event organiser will Perform Home Exercise Program: Both right and left upper extremity;With written HEP provided Additional ADL Goal #1: Pt will identify and incorporate 3 strategies to conserve energy during ADL in preparation for improved ADL independence.  Plan Discharge plan needs to be updated;Frequency needs to be updated    Co-evaluation                 End of Session Equipment Utilized During Treatment: Gait belt;Rolling walker   Activity Tolerance Patient limited by fatigue   Patient Left in chair;with call bell/phone within reach;with chair alarm set   Nurse Communication Other (comment) (desat wtih activity)        Time: 1330-1400 OT Time Calculation (min): 30 min  Charges: OT General Charges $OT Visit: 1 Procedure OT Treatments $Self Care/Home Management : 23-37 mins  [place] Grindstaff,HILLARY 05/03/2016, 2:17 PM   [facility], OT/L  [phone] 05/03/2016

## 2016-05-03 NOTE — Progress Notes (Signed)
Physical Therapy Treatment Patient Details Name: Carrie Watts MRN: [id number] DOB: [birth year] Today's Date: 05/03/2016    History of Present Illness 66 y/o female came to the [facility] ED on 1/14 with cough, dyspnea and chest pain in the setting of two weeks of R sided facial droop and slurred speech after returning from [place] on 1/13. Noted to have a large PE on CT angiogram. MRI and MRA negative.    PT Comments    Pt presented with increased fatigue and labored breathing throughout whole treatment today. C/o feeling lightheaded and dizzy prior to tx. Upon arrival, she was standing up in bathroom after BM with RW. When asked if anyone brought her in here or if she asked for help she replied, "No, I used bedside commode then wanted to come here (bathroom) so I brought myself." SPTA stressed importance of asking for help by pressing call button for safety.  Pt unable to perform longer trial of gait training due to fatigue after stairs; knees buckled during descend of last step. O2 sats ranged from 89-99% on RA and HR ranged from 110-140 bpm. Encouraged pursed lip breathing during rest but patient communicated that she experiences chest pain when taking deep breaths and coughing which is why she tries to avoid it or hold in cough. Pt would benefit from continued vital monitoring throughout treatment and incorporate gait training and stairs in same tx for endurance training if pt can tolerate.  Pt to go home with daughter at d/c today; communicated to nurse episodes of tachycardia, the need for 24 hr assistance/ supervision at home and min- mod level of HHA with ambulation and stairs.    Follow Up Recommendations  SNF;Supervision/Assistance - 24 hour     Equipment Recommendations  None recommended by PT    Recommendations for Other Services       Precautions / Restrictions Precautions Precautions: Fall Precaution Comments: SOB and tachycardia with activity Restrictions Weight Bearing  Restrictions: No    Mobility  Bed Mobility               General bed mobility comments: pt standing in bathroom upon arrival.   Transfers Overall transfer level: Needs assistance Equipment used: Rolling walker (2 wheeled) Transfers: Sit to/from [facility] Sit to Stand: Min guard;Min assist Stand pivot transfers: Min guard       General transfer comment: pt presented with more fatigue today, requiring min assist for initiation out of chair, drops to min guard after.  Pt also requires min guard out of elevated surface.   Ambulation/Gait Ambulation/Gait assistance: Min guard Ambulation Distance (Feet): 10 Feet Assistive device: Rolling walker (2 wheeled)   Gait velocity: slow; labored breathing   General Gait Details: min guard for safety due to patient c/o of feeling dizzy/ lightheaded and weakness in LE's upon arrival. Presented with fatiguing/ exhausting body mechanics.    Stairs Stairs: Yes   Stair Management: One rail Right Number of Stairs: 5 General stair comments: pt required multiple cuing for encouragement to advance up and down stairs. Experienced episode of tachycardia (121-140 bpm) during descend due to fear of falling, despite HHA by SPTA.  Required min-mod A throughout for safety due to fatiguing behavior and pt expressing feeling weak; v/c for slower steps during ascend and encouragement during descend due to increased labored breathing.   Wheelchair Mobility    Modified Rankin (Stroke Patients Only)       Balance Overall balance assessment: Needs assistance Sitting-balance support: Feet supported Sitting balance-Leahy  Scale: Fair     Standing balance support: Bilateral upper extremity supported;During functional activity;Single extremity supported Standing balance-Leahy Scale: Poor Standing balance comment: single UE support during standing activity at sink.                     Cognition Arousal/Alertness:  Awake/alert Behavior During Therapy: WFL for tasks assessed/performed Overall Cognitive Status: Within Functional Limits for tasks assessed                 General Comments: Language barrier but does speak english.      Exercises      General Comments        Pertinent Vitals/Pain Pain Assessment: Faces Faces Pain Scale: Hurts little more Pain Location: chest when taking deep breaths Pain Descriptors / Indicators: Sore Pain Intervention(s): Monitored during session;Repositioned;Limited activity within patient's tolerance    Home Living                      Prior Function            PT Goals (current goals can now be found in the care plan section) Acute Rehab PT Goals Patient Stated Goal: to go home with daughter. PT Goal Formulation: With patient Potential to Achieve Goals: Good Progress towards PT goals: Progressing toward goals (slow progression, limited by fatigue and tachycardia episodes)    Frequency    Min 3X/week      PT Plan Current plan remains appropriate    Co-evaluation             End of Session Equipment Utilized During Treatment: Gait belt;Oxygen (O2 put on post treatment in chair when pt c/o of chest pain) Activity Tolerance: Patient limited by fatigue;Other (comment) (Episodes of tachycardia throughout treatment) Patient left: in chair;with chair alarm set;with call bell/phone within reach;with nursing/sitter in room     Time: [id number] PT Time Calculation (min) (ACUTE ONLY): 40 min  Charges:  $Gait Training: 8-22 mins $Therapeutic Activity: 23-37 mins                    G Codes:      [facility] 2016/05/17, 1:29 PM  Olena Le[place], [place] Pager [phone]

## 2016-05-03 NOTE — Telephone Encounter (Signed)
Hospital follow up appointment scheduled for 05/05/16 @ 1000 @ [place] and the information was placed on the AVS.  Update provided to Carles Collet, RN CM

## 2016-05-03 NOTE — Progress Notes (Signed)
ANTICOAGULATION CONSULT NOTE - Follow Up  Pharmacy Consult for Eliquis Indication: pulmonary embolus /DVT RLE  Allergies  Allergen Reactions  . Dust Mite Extract     Patient Measurements: Height: 5\' 3"  (160 cm) Weight: 174 lb 6.4 oz (79.1 kg) IBW/kg (Calculated) : 52.4     Vital Signs: Temp: 99.1 F (37.3 C) (01/23 0414) Temp Source: Oral (01/23 0414) BP: 114/69 (01/23 0414) Pulse Rate: 93 (01/23 0414)  Labs:  Recent Labs  05/01/16 0501 05/02/16 0534 05/02/16 1634 05/03/16 0953  HGB 7.6* 7.7*  --  8.3*  HCT 23.9* 24.4*  --  26.1*  PLT 213 217  --  233  LABPROT 15.7* 16.6*  --   --   INR 1.24 1.33  --   --   CREATININE  --  0.72 0.83  --     Estimated Creatinine Clearance: 65.5 mL/min (by C-G formula based on SCr of 0.83 mg/dL).   Medical History: Past Medical History:  Diagnosis Date  . Cancer (HCC)    breast  . CHF (congestive heart failure) ([place])   . Diabetes mellitus without complication ([place])   . Renal disorder     Assessment: 66 yo female with new/acute saddle pulmonary embolus in setting of breast cancer. Initial plan was to transition from lovenox to NOAC but due to insurance issues decision was made to transition pt to coumadin that was started on 1/19.  1/23 Decision was made for patient to start Eliquis.  CSM provided resources for discounted drug.  Goal of Therapy:  Anti-Xa level 0.6-1 units/ml 4hrs after LMWH dose given Monitor platelets by anticoagulation protocol: Yes  INR: 2 -3    Plan:  1. Discontinue Lovenox.  Last dose 1/23 at 0630. 2. Eliquis 10mg  PO BID x 7 days followed by 5mg  PO BID therafter - first dose 1/23 PM. 3. Eliquis education.  [employer], Pharm.D., BCPS Clinical Pharmacist Pager [phone] 05/03/2016 10:37 AM

## 2016-05-05 ENCOUNTER — Encounter: Payer: Self-pay | Admitting: Physician Assistant

## 2016-05-05 ENCOUNTER — Ambulatory Visit: Payer: Medicare Other | Attending: Physician Assistant | Admitting: Physician Assistant

## 2016-05-05 ENCOUNTER — Ambulatory Visit (HOSPITAL_COMMUNITY)
Admission: RE | Admit: 2016-05-05 | Discharge: 2016-05-05 | Disposition: A | Discharge status: Home / Self Care | Payer: Medicare Other | Source: Ambulatory Visit | Attending: Physician Assistant | Admitting: Physician Assistant

## 2016-05-05 VITALS — BP 122/86 | HR 99 | Temp 98.5°F | Resp 28 | Wt 192.8 lb

## 2016-05-05 DIAGNOSIS — K625 Hemorrhage of anus and rectum: Secondary | ICD-10-CM | POA: Insufficient documentation

## 2016-05-05 DIAGNOSIS — E119 Type 2 diabetes mellitus without complications: Secondary | ICD-10-CM | POA: Insufficient documentation

## 2016-05-05 DIAGNOSIS — Z7984 Long term (current) use of oral hypoglycemic drugs: Secondary | ICD-10-CM | POA: Diagnosis not present

## 2016-05-05 DIAGNOSIS — K5790 Diverticulosis of intestine, part unspecified, without perforation or abscess without bleeding: Secondary | ICD-10-CM | POA: Diagnosis not present

## 2016-05-05 DIAGNOSIS — I824Y1 Acute embolism and thrombosis of unspecified deep veins of right proximal lower extremity: Secondary | ICD-10-CM | POA: Diagnosis not present

## 2016-05-05 DIAGNOSIS — K922 Gastrointestinal hemorrhage, unspecified: Secondary | ICD-10-CM | POA: Diagnosis not present

## 2016-05-05 DIAGNOSIS — I2692 Saddle embolus of pulmonary artery without acute cor pulmonale: Secondary | ICD-10-CM | POA: Diagnosis not present

## 2016-05-05 DIAGNOSIS — K643 Fourth degree hemorrhoids: Secondary | ICD-10-CM | POA: Insufficient documentation

## 2016-05-05 LAB — CBC WITH DIFFERENTIAL/PLATELET
BASOS PCT: 2 %
Basophils Absolute: 176 cells/uL (ref 0–200)
Eosinophils Absolute: 264 cells/uL (ref 15–500)
Eosinophils Relative: 3 %
HEMATOCRIT: 27.3 % — AB (ref 35.0–45.0)
Hemoglobin: 8.6 g/dL — ABNORMAL LOW (ref 11.7–15.5)
LYMPHS PCT: 14 %
Lymphs Abs: 1232 cells/uL (ref 850–3900)
MCH: 30.4 pg (ref 27.0–33.0)
MCHC: 31.5 g/dL — ABNORMAL LOW (ref 32.0–36.0)
MCV: 96.5 fL (ref 80.0–100.0)
MONO ABS: 440 {cells}/uL (ref 200–950)
MPV: 9 fL (ref 7.5–12.5)
Monocytes Relative: 5 %
Neutro Abs: 6688 cells/uL (ref 1500–7800)
Neutrophils Relative %: 76 %
Platelets: 275 10*3/uL (ref 140–400)
RBC: 2.83 MIL/uL — ABNORMAL LOW (ref 3.80–5.10)
RDW: 18.7 % — AB (ref 11.0–15.0)
WBC: 8.8 10*3/uL (ref 3.8–10.8)

## 2016-05-05 LAB — COMPREHENSIVE METABOLIC PANEL
ALK PHOS: 88 U/L (ref 33–130)
ALT: 28 U/L (ref 6–29)
AST: 41 U/L — ABNORMAL HIGH (ref 10–35)
Albumin: 3.2 g/dL — ABNORMAL LOW (ref 3.6–5.1)
BUN: 10 mg/dL (ref 7–25)
CALCIUM: 9 mg/dL (ref 8.6–10.4)
CO2: 19 mmol/L — ABNORMAL LOW (ref 20–31)
Chloride: 104 mmol/L (ref 98–110)
Creat: 0.75 mg/dL (ref 0.50–0.99)
GLUCOSE: 86 mg/dL (ref 65–99)
POTASSIUM: 4.2 mmol/L (ref 3.5–5.3)
Sodium: 136 mmol/L (ref 135–146)
TOTAL PROTEIN: 6.7 g/dL (ref 6.1–8.1)
Total Bilirubin: 0.6 mg/dL (ref 0.2–1.2)

## 2016-05-05 LAB — GLUCOSE, POCT (MANUAL RESULT ENTRY): POC Glucose: 91 mg/dl (ref 70–99)

## 2016-05-05 NOTE — Addendum Note (Signed)
Addended by: Domenica Fail D on: 05/05/2016 05:31 PM   Modules accepted: Orders

## 2016-05-05 NOTE — Patient Instructions (Addendum)
1. Please proceed to [facility] Radiology Department for a CT of the abdomen. I would like to rule out a GI bleed. 2. Please have your blood work done at the lab in [facility]. It has been ordered STAT so that I may receive results in a couple of hours. 3. Continue taking Eliquis for now. Further guidance will be given once the results of the CT and Labs are in.  4. Please return on Monday 05/09/16 for a follow up and to have another chest xray ordered.  Venous Thromboembolism Venous thromboembolism (VTE) is a condition in which a blood clot (thrombus) develops in the body. A thrombus usually occurs in a deep vein in the leg or the pelvis, but it can also occur in the arm. Sometimes, pieces of a thrombus can break off from its original place of development and travel through the bloodstream to other parts of the body. When that happens, the thrombus is called an embolism. An embolism can block the blood flow in the blood vessels of other organs. There are two serious types of VTE:  Deep vein thrombosis (DVT). A DVT is a thrombus that usually occurs in a deep, larger vein of the lower leg or the pelvis, or in an upper extremity such as the arm.  Pulmonary embolism (PE). A PE occurs when an embolism has formed and traveled to the lungs. A PE can block or decrease the blood flow in one lung or both lungs. VTE is a serious health condition that can cause disability or death. It is very important to get help right away and to not ignore symptoms. What are the causes? VTE is caused by the formation of a blood clot in your leg, pelvis, or arm. Usually, several things contribute to the formation of blood clots. A clot may develop when:  Your blood flow slows down.  Your vein becomes damaged in some way.  You have a condition that makes your blood clot more easily. What increases the risk? A VTE is more likely to develop in:  People who are older, especially over 56 years of  age.  People who are overweight (obese).  People who sit or lie still for a long time, such as during long-distance travel (over 4 hours), bed rest, hospitalization, or during recovery from certain medical conditions like a stroke.  People who do not engage in much physical activity (sedentary lifestyle).  People who have chronic breathing disorders.  People who have a personal or family history of blood clots or blood clotting disease.  People who have peripheral vascular disease (PVD), diabetes, or some types of cancer.  People who have heart disease, especially if the person had a recent heart attack or has congestive heart failure.  People who have neurological diseases that affect the legs (leg paresis).  People who have had a traumatic injury, such as breaking a hip or leg.  People who have recently had major or lengthy surgery, especially on the hip, knee, or abdomen.  People who have had a central line placed inside a large vein.  People who take medicines that contain the hormone estrogen. These include birth control pills and hormone replacement therapy.  Pregnancy or during childbirth or the postpartum period.  Long plane flights (over 8 hours). What are the signs or symptoms? Symptoms of VTE can depend on where the clot is located and whether the clot breaks off and travels to another organ. Sometimes, there may be no symptoms. Symptoms  of a DVT can include:  Swelling of your leg or arm, especially if one side is much worse.  Warmth and redness of your leg or arm, especially if one side is much worse.  Pain in your arm or leg. If the clot is in your leg, symptoms may be more noticeable or worse when you stand or walk.  A feeling of pins and needles if the clot is in the arm. The symptoms of a PE usually start suddenly and include:  Shortness of breath while active or at rest.  Coughing or coughing up blood or blood-tinged mucus.  Chest pain that is often  worse with deep breaths.  Rapid or irregular heartbeat.  Feeling light-headed or dizzy.  Fainting.  Feeling anxious.  Sweating. There may also be pain and swelling in a leg if that is where the blood clot started. These symptoms may represent a serious problem that is an emergency. Do not wait to see if the symptoms will go away. Get medical help right away. Call your local emergency services (911 in the [place] Do not drive yourself to the hospital.  How is this diagnosed? Your health care provider will take a medical history and perform a physical exam. You may also have other tests, including:  Blood tests to assess the clotting properties of your blood.  Imaging tests, such as CT, ultrasound, MRI, X-ray, and other tests to see if you have clots anywhere in your body.  An electrocardiogram (ECG) to look for heart strain from blood clots in the lungs.  An echocardiogram. How is this treated? After a VTE is identified, it can be treated. The main goals of treatment are:  To stop a blood clot from growing larger.  To stop new blood clots from forming.  To stop a blood clot from traveling to the lungs (pulmonary embolism). The type of treatment that you receive depends on many factors, such as the cause of your VTE, your risk for bleeding or developing more clots, and other medical conditions that you have. Sometimes, a combination of treatments is necessary. Treatment options may be combined and include:  Monitoring the blood clot with ultrasound.  Taking medicines by mouth, such as newer blood thinners (anticoagulants), thrombolytics, or warfarin.  Taking anticoagulant medicine by injection or through an IV tube.  Wearingcompression stockings or using different types of devices.  Surgery (rare) to remove the blood clot or to place a filter in your abdomen to stop the blood clot from traveling to your lungs. Treatments for VTE are often divided into immediate treatment and  long-term treatment (up to 3 months after VTE). You can work with your health care provider to choose the treatment program that is best for you. Follow these instructions at home: If you are taking a newer oral anticoagulant:  Take the medicine every single day at the same time each day.  Understand what foods and drugs interact with this medicine.  Understand that there are no regular blood tests required when using this medicine.  Understand the side effects of this medicine, including excessive bruising or bleeding. Ask your health care provider or pharmacist about other possible side effects. If you are taking warfarin:  Understand how to take warfarin and know which foods can affect how warfarin works in Veterinary surgeon.  Understand that it is dangerous to take too much or too little warfarin. Too much warfarin increases the risk of bleeding. Too little warfarin continues to allow the risk for blood clots.  Follow your PT and INR blood testing schedule. The PT and INR results allow your health care provider to adjust your dose of warfarin. It is very important that you have your PT and INR tested as often as told by your health care provider.  Avoid major changes in your diet, or tell your health care provider before you change your diet. Arrange a visit with a registered dietitian to answer your questions. Many foods, especially foods that are high in vitamin K, can interfere with warfarin and affect the PT and INR results. Eat a consistent amount of foods that are high in vitamin K, such as:  Spinach, kale, broccoli, cabbage, collard greens, turnip greens, Brussels sprouts, peas, cauliflower, seaweed, and parsley.  Beef liver and pork liver.  Green tea.  Soybean oil.  Tell your health care provider about any and all medicines, vitamins, and supplements that you take, including aspirin and other over-the-counter anti-inflammatory medicines. Be especially cautious with aspirin and  anti-inflammatory medicines. Do not take those before you ask your health care provider if it is safe to do so. This is important because many medicines can interfere with warfarin and affect the PT and INR results.  Do not start or stop taking any over-the-counter or prescription medicine unless your health care provider or pharmacist tells you to do so. If you take warfarin, you will also need to do these things:  Hold pressure over cuts for longer than usual.  Tell your dentist and other health care providers that you are taking warfarin before you have any procedures in which bleeding may occur.  Avoid alcohol or drink very small amounts. Tell your health care provider if you change your alcohol intake.  Do not use tobacco products, including cigarettes, chewing tobacco, and e-cigarettes. If you need help quitting, ask your health care provider.  Avoid contact sports. General instructions  Take over-the-counter and prescription medicines only as told by your health care provider. Anticoagulant medicines can have side effects, including easy bruising and difficulty stopping bleeding. If you are prescribed an anticoagulant, you will also need to do these things:  Hold pressure over cuts for longer than usual.  Tell your dentist and other health care providers that you are taking anticoagulants before you have any procedures in which bleeding may occur.  Avoid contact sports.  Wear a medical alert bracelet or carry a medical alert card that says you have had a PE.  Ask your health care provider how soon you can go back to your normal activities. Stay active to prevent new blood clots from forming.  Make sure to exercise while traveling or when you have been sitting or standing for a long period of time. It is very important to exercise. Exercise your legs by walking or by tightening and relaxing your leg muscles often. Take frequent walks.  Wear compression stockings as told by your  health care provider to help prevent more blood clots from forming.  Do not use tobacco products, including cigarettes, chewing tobacco, and e-cigarettes. If you need help quitting, ask your health care provider.  Keep all follow-up appointments with your health care provider. This is important. How is this prevented? Take these actions to decrease your risk of developing another VTE:  Exercise regularly. For at least 30 minutes every day, engage in:  Activity that involves moving your arms and legs.  Activity that encourages good blood flow through your body by increasing your heart rate.  Exercise your arms and legs every  hour during long-distance travel (over 4 hours). Drink plenty of water and avoid drinking alcohol while traveling.  Avoid sitting or lying in bed for long periods of time without moving your legs.  Maintain a weight that is appropriate for your height. Ask your health care provider what weight is healthy for you.  If you are a woman who is over 46 years of age, avoid unnecessary use of medicines that contain estrogen. These include birth control pills.  Do not smoke, especially if you take estrogen medicines. If you need help quitting, ask your health care provider. If you are hospitalized, prevention measures may include:  Early walking after surgery, as soon as your health care provider says that it is safe.  Receiving anticoagulants to prevent blood clots.If you cannot take anticoagulants, other options may be available, such as wearing compression stockings or using different types of devices. Get help right away if:  You have new or increased pain, swelling, or redness in an arm or leg.  You have numbness or tingling in an arm or leg.  You have shortness of breath while active or at rest.  You have chest pain.  You have a rapid or irregular heartbeat.  You feel light-headed or dizzy.  You cough up blood.  You notice blood in your vomit, bowel  movement, or urine. These symptoms may represent a serious problem that is an emergency. Do not wait to see if the symptoms will go away. Get medical help right away. Call your local emergency services (911 in the [place] Do not drive yourself to the hospital.  This information is not intended to replace advice given to you by your health care provider. Make sure you discuss any questions you have with your health care provider. Document Released: 01/23/2009 Document Revised: 09/09/2015 Document Reviewed: 07/23/2014 Elsevier Interactive Patient Education  2017 [employer].

## 2016-05-05 NOTE — Progress Notes (Signed)
Dizziness, weakness, fatigue. Has a noticed blood per rectum. Not sure if from a tear from hospitalization or Eliquis States it is not in the urine or after she defecates.  Vision changes: cloudiness, fuzzy. CBG 91

## 2016-05-05 NOTE — Progress Notes (Addendum)
Patient ID: Dessire Grimes, female   DOB: 26-Sep-1950, 66 y.o.   MRN: [id number]    Subjective:  Patient ID: Maikayla Beggs, female    DOB: [birth year]  Age: 66 y.o. MRN: [id number]  CC: follow up from hospital for PE and DVT  HPI Tamecka Milham is a 66 yo black female with a PMH of recent PE and RLE DVT, new onset DM2, HTN, and left breast cancer. She is here visiting form [place] and has been receiving chemotherapy in [place] for left breast cancer. She is on treatment 4 of 8 for her cancer. She was discharged on 05/03/16 from [facility] and is currently taking Apixaban '10mg'$  BID. She reports feeling mildly better and no longer coughing up blood. However, she reports feeling weak, tired, somewhat lightheaded, mild visual blurring, mild LE edema bilaterally, and with cramping of the feet. Also complains of dyspnea on exertion. Has noticed spotting on the toilet paper when wiping after bowel movement. No frank blood. Has been constipated and is taking Miralax. Daughter accompanies patient and has been watching over patient. Denies chest pain, headache, abdominal pain, f/c/n/v, hematuria, or confusion.   Outpatient Medications Prior to Visit  Medication Sig Dispense Refill  . apixaban (ELIQUIS) 5 MG TABS tablet Take 2 tablets (10 mg total) by mouth 2 (two) times daily. 15 tablet 0  . aspirin EC 81 MG tablet Take 81 mg by mouth daily.    [name] atorvastatin (LIPITOR) 20 MG tablet Take 1 tablet (20 mg total) by mouth at bedtime. Can switch to a cheaper statin if needed 30 tablet 0  . folic acid (FOLVITE) 657 MCG tablet Take 800 mcg by mouth daily.    [name] glipiZIDE (GLUCOTROL) 5 MG tablet Take 1 tablet (5 mg total) by mouth 2 (two) times daily before a meal. 60 tablet 0  . glucose monitoring kit (FREESTYLE) monitoring kit 1 each by Does not apply route 4 (four) times daily - after meals and at bedtime. 1 month Diabetic Testing Supplies for QAC-QHS accuchecks.  Diagnosis E11.65. Any brand OK 1 each 1  .  metFORMIN (GLUCOPHAGE) 1000 MG tablet Take 1 tablet (1,000 mg total) by mouth 2 (two) times daily with a meal. 60 tablet 0  . omeprazole (PRILOSEC) 20 MG capsule Take 20 mg by mouth daily.    . ranitidine (ZANTAC) 300 MG tablet Take 300 mg by mouth at bedtime.    Derrill Memo ON 05/11/2016] apixaban (ELIQUIS) 5 MG TABS tablet Take 1 tablet (5 mg total) by mouth 2 (two) times daily. 60 tablet 0  . methyldopa (ALDOMET) 250 MG tablet Take 250 mg by mouth 2 (two) times daily.    . penicillin v potassium (VEETID) 500 MG tablet Take 500 mg by mouth.     No facility-administered medications prior to visit.     ROS Review of Systems  Constitutional: Positive for fatigue. Negative for chills, diaphoresis and fever.  Eyes: Positive for visual disturbance.  Respiratory: Positive for shortness of breath. Negative for cough.   Cardiovascular: Positive for leg swelling. Negative for chest pain and palpitations.  Gastrointestinal: Positive for anal bleeding, constipation and rectal pain. Negative for abdominal distention, abdominal pain, blood in stool, diarrhea, nausea and vomiting.  Genitourinary: Negative for flank pain.  Musculoskeletal: Positive for myalgias. Negative for back pain.  Skin: Negative for rash and wound.  Neurological: Positive for light-headedness. Negative for dizziness, weakness and headaches.  Psychiatric/Behavioral: Negative for agitation and confusion.    Objective:  BP 122/86 (BP  Location: Right Arm, Patient Position: Sitting, Cuff Size: Large)   Pulse 99   Temp 98.5 F (36.9 C) (Oral)   Resp (!) 28   Wt 192 lb 12.8 oz (87.5 kg)   SpO2 97% Comment: Room air  BMI 34.15 kg/m   BP/Weight 05/05/2016 05/03/2016 4/46/1901  Systolic BP 222 411 -  Diastolic BP 86 63 -  Wt. (Lbs) 192.8 - 174.4  BMI 34.15 - -    Physical Exam  Constitutional:  Frail, NAD, overweight, polite  HENT:  Head: Normocephalic and atraumatic.  Eyes: Conjunctivae are normal.  Neck: Normal range of  motion. No thyromegaly present.  Cardiovascular: Normal rate, regular rhythm, normal heart sounds and intact distal pulses.   Pulmonary/Chest: She has no wheezes. She exhibits no tenderness.  Increased respiratory effort but no accessory muscle use. Rhonchi diffusely bilaterally.  Abdominal: There is no tenderness.  Skin: She is not diaphoretic.     Assessment & Plan:   1. Acute saddle pulmonary embolism without acute cor pulmonale (HCC)  -Likely due to hypercoagulable state of cancer and recent travel. -Please continue on Eliquis as prescribed for now.  - CBC with Differential - Comprehensive metabolic panel  2. Acute deep vein thrombosis (DVT) of proximal vein of right lower extremity (HCC) -Continue on Eliquis - CBC with Differential - Comprehensive metabolic panel  3. Type 2 diabetes mellitus without complication, without long-term current use of insulin (HCC) -We will continue to monitor. Continue on Metformin. - Glucose (CBG) - Comprehensive metabolic panel  4. Rectal bleeding Guaiac testing positive. May likely be due to hemorrhoid but can not exclude GI bleed. Will send for STAT CT abdomen. Will consider colonoscopy if bleeding continues or worsens.  - Fecal Occult Blood, Guaiac - CBC with Differential - CT Abdomen Pelvis Wo Contrast; Future  5. Grade IV hemorrhoids You are doing well by taking Miralax. Softening the stools will help reduce strain during bowel movement. Straining during a bowel movement may produce hemorrhoids.    Follow-up: 05/09/16   Clent Demark PA

## 2016-05-09 ENCOUNTER — Encounter: Payer: Self-pay | Admitting: Licensed Clinical Social Worker

## 2016-05-09 ENCOUNTER — Encounter: Payer: Self-pay | Admitting: Physician Assistant

## 2016-05-09 ENCOUNTER — Ambulatory Visit: Payer: Medicare Other | Attending: Physician Assistant | Admitting: Physician Assistant

## 2016-05-09 VITALS — BP 118/75 | HR 112 | Temp 98.7°F | Resp 40 | Wt 189.6 lb

## 2016-05-09 DIAGNOSIS — I824Y1 Acute embolism and thrombosis of unspecified deep veins of right proximal lower extremity: Secondary | ICD-10-CM

## 2016-05-09 DIAGNOSIS — Z0001 Encounter for general adult medical examination with abnormal findings: Secondary | ICD-10-CM | POA: Insufficient documentation

## 2016-05-09 DIAGNOSIS — E11649 Type 2 diabetes mellitus with hypoglycemia without coma: Secondary | ICD-10-CM

## 2016-05-09 DIAGNOSIS — E119 Type 2 diabetes mellitus without complications: Secondary | ICD-10-CM | POA: Insufficient documentation

## 2016-05-09 DIAGNOSIS — Z79899 Other long term (current) drug therapy: Secondary | ICD-10-CM | POA: Diagnosis not present

## 2016-05-09 DIAGNOSIS — I2699 Other pulmonary embolism without acute cor pulmonale: Secondary | ICD-10-CM | POA: Insufficient documentation

## 2016-05-09 DIAGNOSIS — I2692 Saddle embolus of pulmonary artery without acute cor pulmonale: Secondary | ICD-10-CM

## 2016-05-09 DIAGNOSIS — H539 Unspecified visual disturbance: Secondary | ICD-10-CM | POA: Insufficient documentation

## 2016-05-09 DIAGNOSIS — Z7984 Long term (current) use of oral hypoglycemic drugs: Secondary | ICD-10-CM | POA: Insufficient documentation

## 2016-05-09 DIAGNOSIS — Z7982 Long term (current) use of aspirin: Secondary | ICD-10-CM | POA: Diagnosis not present

## 2016-05-09 DIAGNOSIS — D649 Anemia, unspecified: Secondary | ICD-10-CM | POA: Diagnosis not present

## 2016-05-09 DIAGNOSIS — R202 Paresthesia of skin: Secondary | ICD-10-CM | POA: Diagnosis not present

## 2016-05-09 LAB — CBC WITH DIFFERENTIAL/PLATELET
BASOS PCT: 0 %
Basophils Absolute: 0 cells/uL (ref 0–200)
Eosinophils Absolute: 234 cells/uL (ref 15–500)
Eosinophils Relative: 3 %
HCT: 30.8 % — ABNORMAL LOW (ref 35.0–45.0)
Hemoglobin: 9.6 g/dL — ABNORMAL LOW (ref 11.7–15.5)
LYMPHS PCT: 20 %
Lymphs Abs: 1560 cells/uL (ref 850–3900)
MCH: 31.2 pg (ref 27.0–33.0)
MCHC: 31.2 g/dL — ABNORMAL LOW (ref 32.0–36.0)
MCV: 100 fL (ref 80.0–100.0)
MONOS PCT: 8 %
MPV: 9.5 fL (ref 7.5–12.5)
Monocytes Absolute: 624 cells/uL (ref 200–950)
Neutro Abs: 5382 cells/uL (ref 1500–7800)
Neutrophils Relative %: 69 %
PLATELETS: 375 10*3/uL (ref 140–400)
RBC: 3.08 MIL/uL — AB (ref 3.80–5.10)
RDW: 19.1 % — AB (ref 11.0–15.0)
WBC: 7.8 10*3/uL (ref 3.8–10.8)

## 2016-05-09 LAB — GLUCOSE, POCT (MANUAL RESULT ENTRY): POC GLUCOSE: 131 mg/dL — AB (ref 70–99)

## 2016-05-09 NOTE — BH Specialist Note (Signed)
Session Start time: 2:30 PM   End Time: 3:00 PM Total Time:  30 minutes Type of Service: [place]: No.   Interpreter Name & Language: N/A # [facility] Visits July 2017-June 2018: 1st   SUBJECTIVE: Cassidey Vanfleet is a 66 y.o. female  Pt. was referred by PA Altamease Oiler for:  depression. Pt. reports the following symptoms/concerns: overwhelming feelings of sadness and worry, low energy, decreased appetite, and difficulty concentrating Duration of problem:  1 month Severity: moderate Previous treatment: None reported   OBJECTIVE: Mood: Anxious & Affect: Depressed Risk of harm to self or others: Pt denied SI/HI Assessments administered: PHQ-9; GAD-7  LIFE CONTEXT:  Family & Social: Pt has stable housing. She receives financial and emotional support from adult daugher, [name], who was present during visit School/ Work: Pt is unemployed. She has applied for Medicaid and Medicare noting hearing date is scheduled for 05/26/16 Self-Care: No report of substance use Life changes: Pt has increased anxiety due to not being insured with ongoing medical concerns. Pt has been referred to multiple specialists; however, is unable to afford medical bills What is important to pt/family (values): Family, Spirituality, Good Health   GOALS ADDRESSED:  Decrease symptoms of depression  INTERVENTIONS: Solution Focused, Strength-based and Supportive   ASSESSMENT:  Pt currently experiencing depression and mild anxiety triggered by financial strain due to inability to afford medications and referrals to specialists. Pt reports overwhelming feelings of sadness and worry, low energy, decreased appetite, and difficulty concentrating. She receives emotional and financial support from adult daughter, [name], who accompanied pt to visit. Pt may benefit from psychoeducation, psychotherapy and medication management. LCSWA educated family on the correlation between physical and  mental health. LCSWA discussed benefits of applying healthy coping skills to decrease symptoms. Pt identified a coping strategy to utilize on a daily basis. LCSWA encouraged family to schedule appointment with Financial Counseling to decrease financial strain. Pt's daughter disclosed that she has attempted to follow up with billing department. LCSWA consulted with Financial Counseling and provided family with contact information for Abelina Bachelor who has made attempts to work with family regarding medical bills. Family was appreciative for the assistance.     PLAN: 1. F/U with behavioral health clinician: Pt was encouraged to contact [place] if symptoms worsen or fail to improve to schedule behavioral appointments at [facility]. 2. Behavioral Health meds: None reported 3. Behavioral recommendations: LCSWA recommends that pt apply healthy coping skills discussed and follow up with Financial Counseling Department to address medical bills. Pt is encouraged to schedule follow up appointment with LCSWA 4. Referral: Brief Counseling/Psychotherapy, Problem-solving teaching/coping strategies, Psychoeducation and Supportive Counseling 5. From scale of 1-10, how likely are you to follow plan: [place], MSW, [place] Worker 05/10/16 10:31 PM  Warmhandoff:   Warm Hand Off Completed.

## 2016-05-09 NOTE — Progress Notes (Signed)
 Subjective:  Patient ID: Carrie Watts, female    DOB: 06/15/1950  Age: 65 y.o. MRN: [id number]  CC: Follow up pulmonary embolism, DVT, and diabetes  HPI Watts Carrie is a 65 y.o. female with a PMH of recent pulmonary embolisim and RLE DVT. It is thought her trip from [place] and hypercoagulable state of breast cancer may have been the cause of PE and DVT. She followed up 4 days ago with myself after having been discharged from hospital 05/03/16. She complained of blood spotting on the toilet tissue after a bowel movement. Positive fecal occult blood in clinic. Hemorrhoid also found on exam. CBC, CT abdomen, and referral for urgent colonoscopy was ordered. CBC showed minimal improvement of anemia from 8.3 to 8.6. CT abdomen significant only for diverticulosis but not diverticulitis. Incidentally, atelectasis was found and patient's daughter was advised to have pt continue on incentive spirometer. Urgent colonoscopy still pending. Today patient is accompanied by daughter. Daughter says mother performs incentive spirometry once in the morning. Daughter also brings a written log of blood pressure, heart rate, and blood sugar results over the past 4 days. Pt states she is feeling somewhat better and is now better able to tolerate longer walks inside the house. No longer sees blood in the toilet paper. Still complains of tingling and numbness of the hand and foot bilaterally. Also complains of visual "fogging" and hollow auditory sound in the ears. Takes medications as directed. Denies chest pain, SOB, headache, abdominal pain, abdominal distention, rash, or GI/GU sxs.    Outpatient Medications Prior to Visit  Medication Sig Dispense Refill  . apixaban (ELIQUIS) 5 MG TABS tablet Take 2 tablets (10 mg total) by mouth 2 (two) times daily. 15 tablet 0  . [START ON 05/11/2016] apixaban (ELIQUIS) 5 MG TABS tablet Take 1 tablet (5 mg total) by mouth 2 (two) times daily. 60 tablet 0  . aspirin EC 81 MG tablet Take  81 mg by mouth daily.    . atorvastatin (LIPITOR) 20 MG tablet Take 1 tablet (20 mg total) by mouth at bedtime. Can switch to a cheaper statin if needed 30 tablet 0  . folic acid (FOLVITE) 800 MCG tablet Take 800 mcg by mouth daily.    . glipiZIDE (GLUCOTROL) 5 MG tablet Take 1 tablet (5 mg total) by mouth 2 (two) times daily before a meal. 60 tablet 0  . glucose monitoring kit (FREESTYLE) monitoring kit 1 each by Does not apply route 4 (four) times daily - after meals and at bedtime. 1 month Diabetic Testing Supplies for QAC-QHS accuchecks.  Diagnosis E11.65. Any brand OK 1 each 1  . metFORMIN (GLUCOPHAGE) 1000 MG tablet Take 1 tablet (1,000 mg total) by mouth 2 (two) times daily with a meal. 60 tablet 0  . methyldopa (ALDOMET) 250 MG tablet Take 250 mg by mouth 2 (two) times daily.    . omeprazole (PRILOSEC) 20 MG capsule Take 20 mg by mouth daily.    . penicillin v potassium (VEETID) 500 MG tablet Take 500 mg by mouth.    . ranitidine (ZANTAC) 300 MG tablet Take 300 mg by mouth at bedtime.     No facility-administered medications prior to visit.     ROS Review of Systems  Constitutional: Positive for appetite change (eats small amounts) and fatigue. Negative for chills, diaphoresis and fever.  HENT: Negative for ear pain.   Eyes: Positive for visual disturbance. Negative for pain, discharge and redness.  Respiratory: Positive for shortness of breath.     Cardiovascular: Positive for leg swelling. Negative for chest pain.  Gastrointestinal: Negative for abdominal distention, abdominal pain, nausea and vomiting.  Genitourinary: Negative for dysuria.  Skin: Negative for rash.  Neurological: Positive for weakness and numbness. Negative for dizziness and headaches.  Psychiatric/Behavioral: Negative for behavioral problems and confusion.    Objective:  BP 98/77 (BP Location: Right Arm, Patient Position: Sitting, Cuff Size: Normal)   Pulse (!) 111   Temp 98.7 F (37.1 C) (Oral)   Resp (!)  40 Comment: shallow  Wt 189 lb 9.6 oz (86 kg)   SpO2 98%   BMI 33.59 kg/m   BP/Weight 05/09/2016 05/05/2016 05/03/2016  Systolic BP 98 122 103  Diastolic BP 77 86 63  Wt. (Lbs) 189.6 192.8 -  BMI 33.59 34.15 -      Physical Exam  Constitutional: She is oriented to person, place, and time. No distress.  NAD, obese, polite  HENT:  Head: Normocephalic and atraumatic.  Eyes: Conjunctivae are normal. Pupils are equal, round, and reactive to light. No scleral icterus.  Neck: Normal range of motion. Neck supple. No thyromegaly present.  Cardiovascular: Normal rate, regular rhythm, normal heart sounds and intact distal pulses.   Pulmonary/Chest:  Breathing somewhat labored but improved over previous visit. Air movement improved mildly over last visit. Rhonchi heard diffusely bilaterally.  Abdominal: Soft. Bowel sounds are normal. She exhibits no distension. There is no tenderness.  Lymphadenopathy:    She has no cervical adenopathy.  Neurological: She is alert and oriented to person, place, and time.  Skin: Skin is warm and dry. No rash noted. She is not diaphoretic.  Psychiatric: She has a normal mood and affect. Her behavior is normal. Thought content normal.     Assessment & Plan:   1. Acute saddle pulmonary embolism without acute cor pulmonale (HCC) -continue on apixaban 10mg BID -start apixaban 5mg BID on 05/11/16 -CXR follow up on abnormal findings.  2. Acute deep vein thrombosis (DVT) of proximal vein of right lower extremity (HCC) -continue on apixaban 10mg BID -start apixaban 5mg BID on 05/11/16  3. Type 2 diabetes mellitus without complication, without long-term current use of insulin (HCC) -last A1c 12.9 on 04/25/16 -continue on Metformin 1000mg BID -discontinue glipizide due to hypoglycemia. Will reconsider restarting depending on glucose log in one week. Try to maintain a regular diet low in carbohydrates. -at home log of 7 readings shows low of 83 and high of 235 on  metformin alone over the past four days. Return in one week on follow up for diabetes with glucose log. -Brief nutritional counseling given 4. Anemia -Labs ordered: CBC  5. Visual disturbances -May likely be related to diabetes. Last A1c 12.9 on 04/25/2016. -Referral to ophthalmology  6.Tingling and Numbness -May be attributed to diabetes.    Follow-up: One week   Roger David Gomez PA     

## 2016-05-09 NOTE — Patient Instructions (Addendum)
1. Acute saddle pulmonary embolism without acute cor pulmonale ([place]) -continue on apixaban 10mg  BID -start apixaban 5mg  BID on 05/11/16  2. Acute deep vein thrombosis (DVT) of proximal vein of right lower extremity ([place]) -continue on apixaban 10mg  BID -start apixaban 5mg  BID on 05/11/16 -chest xray follow on previous abnormal findings. 3. Type 2 diabetes mellitus without complication, without long-term current use of insulin (HCC) -last A1c 12.9 -continue on Metformin 1000mg  BID -discontinue glipizide due to hypoglycemia. Will reconsider restarting depending on glucose log in one week. Try to maintain a regular diet low in carbohydrates. -at home log of 7 readings shows low of 83 and high of 235 on metformin alone. Return in one week on follow up for diabetes with glucose log. -Brief nutritional counseling given 4. Anemia -Labs ordered: CBC, Iron/TIBC,   5. Visual disturbances -May be related to diabetes. Last A1c 12.9 on 04/25/2016. -Referral to ophthalmology  6.Tingling and Numbness -May be attributed to diabetes. Diabetes Mellitus and Food It is important for you to manage your blood sugar (glucose) level. Your blood glucose level can be greatly affected by what you eat. Eating healthier foods in the appropriate amounts throughout the day at about the same time each day will help you control your blood glucose level. It can also help slow or prevent worsening of your diabetes mellitus. Healthy eating may even help you improve the level of your blood pressure and reach or maintain a healthy weight. General recommendations for healthful eating and cooking habits include:  Eating meals and snacks regularly. Avoid going long periods of time without eating to lose weight.  Eating a diet that consists mainly of plant-based foods, such as fruits, vegetables, nuts, legumes, and whole grains.  Using low-heat cooking methods, such as baking, instead of high-heat cooking methods, such as deep  frying. Work with your dietitian to make sure you understand how to use the Nutrition Facts information on food labels. How can food affect me? Carbohydrates  Carbohydrates affect your blood glucose level more than any other type of food. Your dietitian will help you determine how many carbohydrates to eat at each meal and teach you how to count carbohydrates. Counting carbohydrates is important to keep your blood glucose at a healthy level, especially if you are using insulin or taking certain medicines for diabetes mellitus. Alcohol  Alcohol can cause sudden decreases in blood glucose (hypoglycemia), especially if you use insulin or take certain medicines for diabetes mellitus. Hypoglycemia can be a life-threatening condition. Symptoms of hypoglycemia (sleepiness, dizziness, and disorientation) are similar to symptoms of having too much alcohol. If your health care provider has given you approval to drink alcohol, do so in moderation and use the following guidelines:  Women should not have more than one drink per day, and men should not have more than two drinks per day. One drink is equal to:  12 oz of beer.  5 oz of wine.  1 oz of hard liquor.  Do not drink on an empty stomach.  Keep yourself hydrated. Have water, diet soda, or unsweetened iced tea.  Regular soda, juice, and other mixers might contain a lot of carbohydrates and should be counted. What foods are not recommended? As you make food choices, it is important to remember that all foods are not the same. Some foods have fewer nutrients per serving than other foods, even though they might have the same number of calories or carbohydrates. It is difficult to get your body what it needs  when you eat foods with fewer nutrients. Examples of foods that you should avoid that are high in calories and carbohydrates but low in nutrients include:  Trans fats (most processed foods list trans fats on the Nutrition Facts label).  Regular  soda.  Juice.  Candy.  Sweets, such as cake, pie, doughnuts, and cookies.  Fried foods. What foods can I eat? Eat nutrient-rich foods, which will nourish your body and keep you healthy. The food you should eat also will depend on several factors, including:  The calories you need.  The medicines you take.  Your weight.  Your blood glucose level.  Your blood pressure level.  Your cholesterol level. You should eat a variety of foods, including:  Protein.  Lean cuts of meat.  Proteins low in saturated fats, such as fish, egg whites, and beans. Avoid processed meats.  Fruits and vegetables.  Fruits and vegetables that may help control blood glucose levels, such as apples, mangoes, and yams.  Dairy products.  Choose fat-free or low-fat dairy products, such as milk, yogurt, and cheese.  Grains, bread, pasta, and rice.  Choose whole grain products, such as multigrain bread, whole oats, and brown rice. These foods may help control blood pressure.  Fats.  Foods containing healthful fats, such as nuts, avocado, olive oil, canola oil, and fish. Does everyone with diabetes mellitus have the same meal plan? Because every person with diabetes mellitus is different, there is not one meal plan that works for everyone. It is very important that you meet with a dietitian who will help you create a meal plan that is just right for you. This information is not intended to replace advice given to you by your health care provider. Make sure you discuss any questions you have with your health care provider. Document Released: 12/23/2004 Document Revised: 09/03/2015 Document Reviewed: 02/22/2013 Elsevier Interactive Patient Education  2017 [employer].

## 2016-05-10 ENCOUNTER — Other Ambulatory Visit: Payer: Self-pay | Admitting: *Deleted

## 2016-05-10 ENCOUNTER — Telehealth: Payer: Self-pay | Admitting: *Deleted

## 2016-05-10 ENCOUNTER — Telehealth: Payer: Self-pay | Admitting: Oncology

## 2016-05-10 DIAGNOSIS — C50919 Malignant neoplasm of unspecified site of unspecified female breast: Secondary | ICD-10-CM

## 2016-05-10 LAB — FOLATE

## 2016-05-10 LAB — VITAMIN B12: Vitamin B-12: 455 pg/mL (ref 200–1100)

## 2016-05-10 NOTE — Telephone Encounter (Signed)
Pt's daughter called in regarding patients appointment. Informed that insurance and assistance has been applied for.  She will also drop off medical records.

## 2016-05-10 NOTE — Telephone Encounter (Signed)
Attempt to call patient. Left message on voicemail to call office.

## 2016-05-11 ENCOUNTER — Other Ambulatory Visit (HOSPITAL_BASED_OUTPATIENT_CLINIC_OR_DEPARTMENT_OTHER): Payer: Medicare Other

## 2016-05-11 ENCOUNTER — Inpatient Hospital Stay (HOSPITAL_COMMUNITY)
Admission: EM | Admit: 2016-05-11 | Discharge: 2016-05-22 | DRG: 175 | Disposition: A | Discharge status: Home / Self Care | Payer: Medicare Other | Attending: Internal Medicine | Admitting: Internal Medicine

## 2016-05-11 ENCOUNTER — Ambulatory Visit (HOSPITAL_BASED_OUTPATIENT_CLINIC_OR_DEPARTMENT_OTHER): Payer: Medicare Other | Admitting: Oncology

## 2016-05-11 ENCOUNTER — Other Ambulatory Visit: Payer: Self-pay

## 2016-05-11 ENCOUNTER — Ambulatory Visit (HOSPITAL_COMMUNITY)
Admission: RE | Admit: 2016-05-11 | Discharge: 2016-05-11 | Disposition: A | Discharge status: Home / Self Care | Payer: Medicare Other | Source: Ambulatory Visit | Attending: Physician Assistant | Admitting: Physician Assistant

## 2016-05-11 ENCOUNTER — Encounter (HOSPITAL_COMMUNITY): Payer: Self-pay

## 2016-05-11 ENCOUNTER — Emergency Department (HOSPITAL_COMMUNITY): Payer: Medicare Other

## 2016-05-11 VITALS — BP 128/86 | HR 113 | Temp 98.1°F | Resp 17 | Ht 63.0 in | Wt 186.5 lb

## 2016-05-11 DIAGNOSIS — Z9221 Personal history of antineoplastic chemotherapy: Secondary | ICD-10-CM

## 2016-05-11 DIAGNOSIS — R918 Other nonspecific abnormal finding of lung field: Secondary | ICD-10-CM

## 2016-05-11 DIAGNOSIS — I25119 Atherosclerotic heart disease of native coronary artery with unspecified angina pectoris: Secondary | ICD-10-CM | POA: Diagnosis not present

## 2016-05-11 DIAGNOSIS — R932 Abnormal findings on diagnostic imaging of liver and biliary tract: Secondary | ICD-10-CM

## 2016-05-11 DIAGNOSIS — Z803 Family history of malignant neoplasm of breast: Secondary | ICD-10-CM

## 2016-05-11 DIAGNOSIS — R05 Cough: Secondary | ICD-10-CM | POA: Diagnosis not present

## 2016-05-11 DIAGNOSIS — I2699 Other pulmonary embolism without acute cor pulmonale: Secondary | ICD-10-CM

## 2016-05-11 DIAGNOSIS — Z23 Encounter for immunization: Secondary | ICD-10-CM | POA: Diagnosis not present

## 2016-05-11 DIAGNOSIS — R778 Other specified abnormalities of plasma proteins: Secondary | ICD-10-CM

## 2016-05-11 DIAGNOSIS — I110 Hypertensive heart disease with heart failure: Secondary | ICD-10-CM | POA: Diagnosis not present

## 2016-05-11 DIAGNOSIS — Z9012 Acquired absence of left breast and nipple: Secondary | ICD-10-CM

## 2016-05-11 DIAGNOSIS — Z86711 Personal history of pulmonary embolism: Secondary | ICD-10-CM | POA: Insufficient documentation

## 2016-05-11 DIAGNOSIS — D649 Anemia, unspecified: Secondary | ICD-10-CM | POA: Diagnosis present

## 2016-05-11 DIAGNOSIS — C50912 Malignant neoplasm of unspecified site of left female breast: Secondary | ICD-10-CM

## 2016-05-11 DIAGNOSIS — Z7984 Long term (current) use of oral hypoglycemic drugs: Secondary | ICD-10-CM

## 2016-05-11 DIAGNOSIS — R079 Chest pain, unspecified: Secondary | ICD-10-CM | POA: Diagnosis present

## 2016-05-11 DIAGNOSIS — R931 Abnormal findings on diagnostic imaging of heart and coronary circulation: Secondary | ICD-10-CM

## 2016-05-11 DIAGNOSIS — Z79899 Other long term (current) drug therapy: Secondary | ICD-10-CM

## 2016-05-11 DIAGNOSIS — Z79811 Long term (current) use of aromatase inhibitors: Secondary | ICD-10-CM | POA: Diagnosis not present

## 2016-05-11 DIAGNOSIS — Z86718 Personal history of other venous thrombosis and embolism: Secondary | ICD-10-CM | POA: Diagnosis not present

## 2016-05-11 DIAGNOSIS — M109 Gout, unspecified: Secondary | ICD-10-CM | POA: Diagnosis not present

## 2016-05-11 DIAGNOSIS — I4891 Unspecified atrial fibrillation: Secondary | ICD-10-CM | POA: Diagnosis present

## 2016-05-11 DIAGNOSIS — R0789 Other chest pain: Secondary | ICD-10-CM | POA: Diagnosis not present

## 2016-05-11 DIAGNOSIS — I5023 Acute on chronic systolic (congestive) heart failure: Secondary | ICD-10-CM | POA: Diagnosis not present

## 2016-05-11 DIAGNOSIS — R52 Pain, unspecified: Secondary | ICD-10-CM

## 2016-05-11 DIAGNOSIS — J9601 Acute respiratory failure with hypoxia: Secondary | ICD-10-CM | POA: Diagnosis not present

## 2016-05-11 DIAGNOSIS — I272 Pulmonary hypertension, unspecified: Secondary | ICD-10-CM

## 2016-05-11 DIAGNOSIS — R7989 Other specified abnormal findings of blood chemistry: Secondary | ICD-10-CM

## 2016-05-11 DIAGNOSIS — I2692 Saddle embolus of pulmonary artery without acute cor pulmonale: Secondary | ICD-10-CM

## 2016-05-11 DIAGNOSIS — Z170 Estrogen receptor positive status [ER+]: Secondary | ICD-10-CM | POA: Diagnosis not present

## 2016-05-11 DIAGNOSIS — Z800 Family history of malignant neoplasm of digestive organs: Secondary | ICD-10-CM

## 2016-05-11 DIAGNOSIS — C773 Secondary and unspecified malignant neoplasm of axilla and upper limb lymph nodes: Secondary | ICD-10-CM

## 2016-05-11 DIAGNOSIS — I2602 Saddle embolus of pulmonary artery with acute cor pulmonale: Secondary | ICD-10-CM | POA: Diagnosis not present

## 2016-05-11 DIAGNOSIS — Z8673 Personal history of transient ischemic attack (TIA), and cerebral infarction without residual deficits: Secondary | ICD-10-CM

## 2016-05-11 DIAGNOSIS — E1165 Type 2 diabetes mellitus with hyperglycemia: Secondary | ICD-10-CM | POA: Diagnosis not present

## 2016-05-11 DIAGNOSIS — I429 Cardiomyopathy, unspecified: Secondary | ICD-10-CM | POA: Diagnosis not present

## 2016-05-11 DIAGNOSIS — I82401 Acute embolism and thrombosis of unspecified deep veins of right lower extremity: Secondary | ICD-10-CM | POA: Diagnosis not present

## 2016-05-11 DIAGNOSIS — C779 Secondary and unspecified malignant neoplasm of lymph node, unspecified: Secondary | ICD-10-CM

## 2016-05-11 DIAGNOSIS — I5021 Acute systolic (congestive) heart failure: Secondary | ICD-10-CM

## 2016-05-11 DIAGNOSIS — E119 Type 2 diabetes mellitus without complications: Secondary | ICD-10-CM

## 2016-05-11 DIAGNOSIS — IMO0002 Reserved for concepts with insufficient information to code with codable children: Secondary | ICD-10-CM

## 2016-05-11 DIAGNOSIS — D122 Benign neoplasm of ascending colon: Secondary | ICD-10-CM | POA: Diagnosis not present

## 2016-05-11 DIAGNOSIS — Z853 Personal history of malignant neoplasm of breast: Secondary | ICD-10-CM

## 2016-05-11 DIAGNOSIS — E118 Type 2 diabetes mellitus with unspecified complications: Secondary | ICD-10-CM

## 2016-05-11 DIAGNOSIS — R0902 Hypoxemia: Secondary | ICD-10-CM | POA: Diagnosis present

## 2016-05-11 DIAGNOSIS — K5730 Diverticulosis of large intestine without perforation or abscess without bleeding: Secondary | ICD-10-CM | POA: Diagnosis present

## 2016-05-11 DIAGNOSIS — C50812 Malignant neoplasm of overlapping sites of left female breast: Secondary | ICD-10-CM | POA: Insufficient documentation

## 2016-05-11 DIAGNOSIS — I824Y1 Acute embolism and thrombosis of unspecified deep veins of right proximal lower extremity: Secondary | ICD-10-CM

## 2016-05-11 DIAGNOSIS — R0602 Shortness of breath: Secondary | ICD-10-CM | POA: Diagnosis not present

## 2016-05-11 DIAGNOSIS — D638 Anemia in other chronic diseases classified elsewhere: Secondary | ICD-10-CM | POA: Diagnosis not present

## 2016-05-11 DIAGNOSIS — E7800 Pure hypercholesterolemia, unspecified: Secondary | ICD-10-CM | POA: Diagnosis present

## 2016-05-11 DIAGNOSIS — C50919 Malignant neoplasm of unspecified site of unspecified female breast: Secondary | ICD-10-CM

## 2016-05-11 DIAGNOSIS — R195 Other fecal abnormalities: Secondary | ICD-10-CM

## 2016-05-11 DIAGNOSIS — Z7901 Long term (current) use of anticoagulants: Secondary | ICD-10-CM

## 2016-05-11 DIAGNOSIS — K7460 Unspecified cirrhosis of liver: Secondary | ICD-10-CM | POA: Diagnosis present

## 2016-05-11 DIAGNOSIS — K219 Gastro-esophageal reflux disease without esophagitis: Secondary | ICD-10-CM | POA: Diagnosis not present

## 2016-05-11 DIAGNOSIS — I248 Other forms of acute ischemic heart disease: Secondary | ICD-10-CM | POA: Diagnosis not present

## 2016-05-11 HISTORY — DX: Essential (primary) hypertension: I10

## 2016-05-11 LAB — CBC
HEMATOCRIT: 30.1 % — AB (ref 36.0–46.0)
HEMOGLOBIN: 9.7 g/dL — AB (ref 12.0–15.0)
MCH: 31 pg (ref 26.0–34.0)
MCHC: 32.2 g/dL (ref 30.0–36.0)
MCV: 96.2 fL (ref 78.0–100.0)
Platelets: 344 10*3/uL (ref 150–400)
RBC: 3.13 MIL/uL — AB (ref 3.87–5.11)
RDW: 18.2 % — ABNORMAL HIGH (ref 11.5–15.5)
WBC: 8.2 10*3/uL (ref 4.0–10.5)

## 2016-05-11 LAB — PROTIME-INR
INR: 1.44
PROTHROMBIN TIME: 17.7 s — AB (ref 11.4–15.2)

## 2016-05-11 LAB — CBC & DIFF AND RETIC
BASO%: 0.3 % (ref 0.0–2.0)
BASOS ABS: 0 10*3/uL (ref 0.0–0.1)
EOS ABS: 0.1 10*3/uL (ref 0.0–0.5)
EOS%: 1.4 % (ref 0.0–7.0)
HEMATOCRIT: 29.4 % — AB (ref 34.8–46.6)
HEMOGLOBIN: 9.3 g/dL — AB (ref 11.6–15.9)
IMMATURE RETIC FRACT: 28 % — AB (ref 1.60–10.00)
LYMPH#: 1.5 10*3/uL (ref 0.9–3.3)
LYMPH%: 19 % (ref 14.0–49.7)
MCH: 30.6 pg (ref 25.1–34.0)
MCHC: 31.6 g/dL (ref 31.5–36.0)
MCV: 96.7 fL (ref 79.5–101.0)
MONO#: 0.5 10*3/uL (ref 0.1–0.9)
MONO%: 7 % (ref 0.0–14.0)
NEUT#: 5.6 10*3/uL (ref 1.5–6.5)
NEUT%: 72.3 % (ref 38.4–76.8)
Platelets: 313 10*3/uL (ref 145–400)
RBC: 3.04 10*6/uL — ABNORMAL LOW (ref 3.70–5.45)
RDW: 18.5 % — AB (ref 11.2–14.5)
RETIC %: 8.1 % — AB (ref 0.70–2.10)
RETIC CT ABS: 246.24 10*3/uL — AB (ref 33.70–90.70)
WBC: 7.7 10*3/uL (ref 3.9–10.3)

## 2016-05-11 LAB — COMPREHENSIVE METABOLIC PANEL
ALBUMIN: 2.8 g/dL — AB (ref 3.5–5.0)
ALK PHOS: 101 U/L (ref 40–150)
ALT: 24 U/L (ref 0–55)
AST: 32 U/L (ref 5–34)
Anion Gap: 11 mEq/L (ref 3–11)
BILIRUBIN TOTAL: 0.5 mg/dL (ref 0.20–1.20)
BUN: 11.6 mg/dL (ref 7.0–26.0)
CALCIUM: 10.2 mg/dL (ref 8.4–10.4)
CO2: 22 mEq/L (ref 22–29)
Chloride: 104 mEq/L (ref 98–109)
Creatinine: 0.9 mg/dL (ref 0.6–1.1)
EGFR: 73 mL/min/{1.73_m2} — ABNORMAL LOW (ref >90)
Glucose: 114 mg/dl (ref 70–140)
Potassium: 4.7 mEq/L (ref 3.5–5.1)
Sodium: 137 mEq/L (ref 136–145)
TOTAL PROTEIN: 7.4 g/dL (ref 6.4–8.3)

## 2016-05-11 LAB — I-STAT TROPONIN, ED: TROPONIN I, POC: 0.13 ng/mL — AB (ref 0.00–0.08)

## 2016-05-11 LAB — BASIC METABOLIC PANEL
ANION GAP: 13 (ref 5–15)
BUN: 10 mg/dL (ref 6–20)
CALCIUM: 9.9 mg/dL (ref 8.9–10.3)
CHLORIDE: 102 mmol/L (ref 101–111)
CO2: 21 mmol/L — AB (ref 22–32)
Creatinine, Ser: 0.95 mg/dL (ref 0.44–1.00)
GFR calc Af Amer: >60 mL/min (ref >60)
GFR calc non Af Amer: >60 mL/min (ref >60)
GLUCOSE: 129 mg/dL — AB (ref 65–99)
Potassium: 4.4 mmol/L (ref 3.5–5.1)
Sodium: 136 mmol/L (ref 135–145)

## 2016-05-11 LAB — CHCC SMEAR

## 2016-05-11 MED ORDER — AZITHROMYCIN 250 MG PO TABS
500.0000 mg | ORAL_TABLET | Freq: Once | ORAL | Status: AC
  Administered 2016-05-12: 500 mg via ORAL
  Filled 2016-05-11: qty 2

## 2016-05-11 MED ORDER — TRAMADOL HCL 50 MG PO TABS: 50.0000 mg | ORAL_TABLET | Freq: Four times a day (QID) | ORAL | 1 refills | Status: DC | PRN

## 2016-05-11 MED ORDER — IOPAMIDOL (ISOVUE-370) INJECTION 76%
INTRAVENOUS | Status: AC
  Administered 2016-05-11: 100 mL
  Filled 2016-05-11: qty 100

## 2016-05-11 MED ORDER — ANASTROZOLE 1 MG PO TABS: 1.0000 mg | ORAL_TABLET | Freq: Every day | ORAL | 4 refills | Status: DC

## 2016-05-11 NOTE — ED Triage Notes (Signed)
Pt complaining of L sided chest pain. Pt states pain worseing chest pain with deep breathing. Pt states seen here earlier today. Pt tachypnic at triage.

## 2016-05-11 NOTE — ED Provider Notes (Signed)
[facility] DEPT Provider Note   CSN: [id number] Arrival date & time: 05/11/16  1819     History   Chief Complaint Chief Complaint  Patient presents with  . Chest Pain  . Shortness of Breath    HPI Carrie Watts is a 66 y.o. female.  HPI   Patient is a 66 year old female presenting with shortness of breath worsening chest pain. Patient had recent diagnosis of PE. She had a saddle pulmonary embolism and was discharged recently. Patient was discharged on Alec. Patient's been taking it. However she reports worsening shortness of breath and chest pain. Patient went to her oncologist today and had a chest x-ray which showed an abnormal was sent to the emergency department.  No fever. No nausea no vomiting or diarrhea. Patient does complain of left ear fullness and pain.  Past Medical History:  Diagnosis Date  . Cancer (HCC)    breast  . CHF (congestive heart failure) ([place])   . Diabetes mellitus without complication ([place])   . Hypertension   . Renal disorder     Patient Active Problem List   Diagnosis Date Noted  . Anemia 05/11/2016  . Malignant neoplasm of overlapping sites of left breast in female, estrogen receptor positive ([name]) 05/11/2016  . Acute deep vein thrombosis (DVT) of right lower extremity ([place])   . Hyperlipidemia   . Saddle pulmonary embolus ([place]) 04/24/2016  . DM2 (diabetes mellitus, type 2) ([place]) 04/24/2016  . Stroke-like symptoms 04/24/2016  . Acute saddle pulmonary embolism with acute cor pulmonale ([place]) 04/24/2016  . History of cancer of left breast 04/24/2016  . Pressure injury of skin 04/24/2016    Past Surgical History:  Procedure Laterality Date  . BREAST SURGERY      OB History    No data available       Home Medications    Prior to Admission medications   Medication Sig Start Date End Date Taking? Authorizing Provider  apixaban (ELIQUIS) 5 MG TABS tablet Take 1 tablet (5 mg total) by mouth 2 (two) times daily. Patient taking  differently: Take 5 mg by mouth daily.  05/11/16  Yes Thurnell Lose, MD  atorvastatin (LIPITOR) 20 MG tablet Take 1 tablet (20 mg total) by mouth at bedtime. Can switch to a cheaper statin if needed 05/03/16  Yes Thurnell Lose, MD  folic acid (FOLVITE) 818 MCG tablet Take 800 mcg by mouth daily.   Yes Historical Provider, MD  glipiZIDE (GLUCOTROL) 5 MG tablet Take 1 tablet (5 mg total) by mouth 2 (two) times daily before a meal. 05/03/16  Yes Thurnell Lose, MD  metFORMIN (GLUCOPHAGE) 1000 MG tablet Take 1 tablet (1,000 mg total) by mouth 2 (two) times daily with a meal. 05/03/16  Yes Thurnell Lose, MD  Multiple Vitamin (MULTIVITAMIN WITH MINERALS) TABS tablet Take 1 tablet by mouth daily.   Yes Historical Provider, MD  omeprazole (PRILOSEC) 20 MG capsule Take 20 mg by mouth daily.   Yes Historical Provider, MD  ranitidine (ZANTAC) 300 MG tablet Take 300 mg by mouth at bedtime.   Yes Historical Provider, MD  anastrozole (ARIMIDEX) 1 MG tablet Take 1 tablet (1 mg total) by mouth daily. Patient not taking: Reported on 05/11/2016 05/11/16   Chauncey Cruel, MD  apixaban (ELIQUIS) 5 MG TABS tablet Take 2 tablets (10 mg total) by mouth 2 (two) times daily. 05/03/16 05/10/16  Thurnell Lose, MD  glucose monitoring kit (FREESTYLE) monitoring kit 1 each by Does not apply  route 4 (four) times daily - after meals and at bedtime. 1 month Diabetic Testing Supplies for QAC-QHS accuchecks.  Diagnosis E11.65. Any brand OK 05/03/16   Thurnell Lose, MD  traMADol (ULTRAM) 50 MG tablet Take 1 tablet (50 mg total) by mouth every 6 (six) hours as needed. Patient not taking: Reported on 05/11/2016 05/11/16   Chauncey Cruel, MD    Family History Family History  Problem Relation Age of Onset  . Heart attack Mother   . Clotting disorder Neg Hx     Social History Social History  Substance Use Topics  . Smoking status: Never Smoker  . Smokeless tobacco: Never Used  . Alcohol use No     Allergies     Dust mite extract   Review of Systems Review of Systems  Constitutional: Positive for fatigue. Negative for chills.  HENT: Positive for ear pain.   Respiratory: Positive for cough and shortness of breath.   Cardiovascular: Positive for chest pain.  Neurological: Positive for light-headedness.  Psychiatric/Behavioral: Negative for agitation and confusion.  All other systems reviewed and are negative.    Physical Exam Updated Vital Signs BP 106/82   Pulse 103   Temp 98.6 F (37 C)   Resp 22   SpO2 100%   Physical Exam  Constitutional: She is oriented to person, place, and time. She appears well-developed and well-nourished.  HENT:  Head: Normocephalic and atraumatic.  Right Ear: External ear normal.  Left Ear: External ear normal.  Left ear with opacification. Lack of light reflex.  Eyes: Pupils are equal, round, and reactive to light. Right eye exhibits no discharge. Left eye exhibits no discharge.  Cardiovascular:  Tachycardia.  Pulmonary/Chest: Breath sounds normal. No respiratory distress.  Tachypnea. 90% on room air.  Abdominal: Soft. There is no tenderness.  Musculoskeletal: Normal range of motion.  Neurological: She is oriented to person, place, and time.  Skin: Skin is warm and dry. She is not diaphoretic.  Psychiatric: She has a normal mood and affect.  Nursing note and vitals reviewed.    ED Treatments / Results  Labs (all labs ordered are listed, but only abnormal results are displayed) Labs Reviewed  BASIC METABOLIC PANEL - Abnormal; Notable for the following:       Result Value   CO2 21 (*)    Glucose, Bld 129 (*)    All other components within normal limits  CBC - Abnormal; Notable for the following:    RBC 3.13 (*)    Hemoglobin 9.7 (*)    HCT 30.1 (*)    RDW 18.2 (*)    All other components within normal limits  PROTIME-INR - Abnormal; Notable for the following:    Prothrombin Time 17.7 (*)    All other components within normal limits   I-STAT TROPOININ, ED - Abnormal; Notable for the following:    Troponin i, poc 0.13 (*)    All other components within normal limits  INFLUENZA PANEL BY PCR (TYPE A & B)    EKG  EKG Interpretation None       Radiology Dg Chest 2 View  Result Date: 05/11/2016 CLINICAL DATA:  Left-sided chest pain extending to the mandible and shoulder. Cough. Elevated troponin. EXAM: CHEST  2 VIEW COMPARISON:  05/11/2016 at 14:11 FINDINGS: Persistent left base opacity is unchanged and could represent pneumonia or evolving pulmonary infarction. Right lung is clear. Pulmonary vasculature is normal. Hilar and mediastinal contours are unremarkable and unchanged. Mild cardiomegaly, unchanged. IMPRESSION: Left  base opacity, with considerations including pneumonia, pulmonary infarction, aspiration. Normal pulmonary vasculature. Electronically Signed   By: Andreas Newport M.D.   On: 05/11/2016 19:49   Dg Chest 2 View  Result Date: 05/11/2016 CLINICAL DATA:  Acute onset of shortness of breath and mid chest pain. Initial encounter. EXAM: CHEST  2 VIEW COMPARISON:  Chest radiograph and CTA of the chest performed 04/24/2016 FINDINGS: The lungs are well-aerated. Mild left basilar airspace opacity raises question for pneumonia. There is no evidence of pleural effusion or pneumothorax. The heart is borderline normal in size. No acute osseous abnormalities are seen. IMPRESSION: Mild left basilar airspace opacity raises question for pneumonia. Residual pulmonary infarct may have a similar appearance, given recent saddle pulmonary embolus. Electronically Signed   By: Garald Balding M.D.   On: 05/11/2016 16:19   Ct Angio Chest Pe W And/or Wo Contrast  Result Date: 05/11/2016 CLINICAL DATA:  Cough, shortness of breath. Recent saddle pulmonary embolus. Now with LEFT pulmonary infiltrate, assess for infarct. History of breast cancer, diabetes, CHF. EXAM: CT ANGIOGRAPHY CHEST WITH CONTRAST TECHNIQUE: Multidetector CT imaging of  the chest was performed using the standard protocol during bolus administration of intravenous contrast. Multiplanar CT image reconstructions and MIPs were obtained to evaluate the vascular anatomy. CONTRAST:  80 cc Isovue 370. COMPARISON:  CT chest April 24, 2016. FINDINGS: CARDIOVASCULAR: Adequate contrast opacification of the pulmonary artery's. Main pulmonary artery is not enlarged. Residual pulmonary emboli LEFT lobar pulmonary artery's, predominately central in distribution and occlusive an LEFT lower lobe. Dissolution of the saddle embolic component. Residual nonocclusive embolus RIGHT middle lobar artery. RIGHT lower lobe pulmonary artery filling defect is now peripheral compatible with early chronicity, propagating to the segmental artery's without occlusion. Heart size is mildly enlarged, RV/ LV equals 0.9, previously 1.2. No pericardial effusions. Thoracic aorta is normal course and caliber, trace calcific atherosclerosis. MEDIASTINUM/NODES: No lymphadenopathy by CT size criteria. LUNGS/PLEURA: Tracheobronchial tree is patent, no pneumothorax. Bilateral lower lobe and lingular enhancing atelectasis without wedge-like consolidation to suggest infarct. No pleural effusion. UPPER ABDOMEN: Included view of the abdomen is unremarkable. MUSCULOSKELETAL: Visualized soft tissues and included osseous structures are nonacute. Broad dextroscoliosis. Mild old T3 and T8 for compression fractures. Review of the MIP images confirms the above findings. IMPRESSION: No new pulmonary emboli. Dissolving, decreased clot burden from prior CT. No pulmonary infarcts. Bibasilar atelectasis. Mild cardiomegaly. Borderline, improved RIGHT heart strain (RV/LV 0.9, previously 1.2). Electronically Signed   By: Elon Alas M.D.   On: 05/11/2016 23:07    Procedures Procedures (including critical care time)  Medications Ordered in ED Medications  azithromycin (ZITHROMAX) tablet 500 mg (not administered)  iopamidol  (ISOVUE-370) 76 % injection (100 mLs  Contrast Given 05/11/16 2142)     Initial Impression / Assessment and Plan / ED Course  I have reviewed the triage vital signs and the nursing notes.  Pertinent labs & imaging results that were available during my care of the patient were reviewed by me and considered in my medical decision making (see chart for details).     Patient is a 66 year old female with history recently of saddle PE, on [place], presenting with worsening shortness of breath and chest pain. Patient discharged from service within the last week. Patient is here wtih, hypoxic, tachycardic, with a new left opacification on x-ray. Concern for pulmonary infarct versus infection. CT angiogram ordered. We'll give supplement oxygen, pain control.  Patient hypoxic on RA, requires 2 L, still tachypnic.   L ear effusion  11:40 PM CT does not show that the PE is getting any bigger. However given patient's hypoxia, oxygen requirement, tachypnea we'll need to admit.  We will treat patient's ear effusion with azithromycin should cover for any atypical organisms. Flu pending.    Final Clinical Impressions(s) / ED Diagnoses   Final diagnoses:  None    New Prescriptions New Prescriptions   No medications on file     Dahlila Pfahler Julio Alm, MD 05/11/16 2340

## 2016-05-11 NOTE — ED Notes (Signed)
Patient taken to CT.

## 2016-05-11 NOTE — Progress Notes (Signed)
I called and spoke to patient's daughter. I shared the CXR result. Patient's daughter then told me pt has been having epigastric pain since yesterday. Pain has worsened today. There is also a headache since yesterday. No retching, vomiting, bleeding, melena, or fever. However, due to patient's high risk of bleeding and comorbidities, I have advised to take patient to the emergency department. Daughter was in agreement and will take her to emergency department.

## 2016-05-11 NOTE — Progress Notes (Signed)
Carrie Watts  [phone] Fax:([phone]     ID: Carrie Watts DOB: [birth year]  MR#: [id number]  KGM#:[id number]  Patient Care Team: No Pcp Per Patient as PCP - General (General Practice) Chauncey Cruel, MD as Consulting Physician (Oncology) Chauncey Cruel, MD OTHER MD:  CHIEF COMPLAINT: Estrogen receptor positive breast cancer  CURRENT TREATMENT: Anastrozole   BREAST CANCER HISTORY: The patient tells me she herself palpated a mass in her left breast and brought it to medical attention. She underwent mammography, ultrasonography, and biopsy and then proceeded to left mastectomy and axillary lymph node dissection at [facility] in [place]. The pathology from this procedure ([id number]) describes a 2.1 cm tumor, grade 2, mixed infiltrating lobular and ductal, involving multiple quadrants. The deep margin was clear. 2 of 15 lymph nodes were involved.  The patient was then treated with what I her report seems to have been doxorubicin and cyclophosphamide given every 3 weeks 4, beginning October and completed December, at which point she was started on what sounds like paclitaxel weekly. According to her account she received 1 dose of paclitaxel 04/06/2016. At that time she moved to this area to be closer to her daughter and to receive further treatment.  Of note, on 04/24/2016 she presented to the emergency room with chest pain and shortness of breath, with a CT angiogram on that date showing a saddle embolus with bilateral lobe wall and segmental pulmonary emboli. Doppler ultrasonography the next day documented a right lower extremity deep venous thrombosis. The patient was started on Lovenox and transitioned to apixaban   Her subsequent history is as detailed below  INTERVAL HISTORY: Ms Carrie Watts Was evaluated in the breast clinic 05/11/2016 accompanied by her daughter   REVIEW OF SYSTEMS: The patient tells me she has been very  confused, but she is better and she is very proud that today she was able to give me a full account of her entire breast cancer history, with some hesitation and some difficulty, but accurately according to her daughter. Currently the patient denies severe headaches, nausea, vomiting, visual changes, or neck stiffness, but  has difficulties with balance and is using a wheelchair. She has a mild cough but no significant phlegm production, pleurisy, or shortness of breath. There has been no change in bowel or bladder habits. A detailed review of systems today was otherwise noncontributory  PAST MEDICAL HISTORY: Past Medical History:  Diagnosis Date  . Angina of effort ([place])   . Atrial fibrillation ([place])   . Cancer (HCC)    breast  . CHF (congestive heart failure) ([place])   . Deep venous thrombosis ([place])   . Diabetes mellitus without complication ([place])   . Hypercholesterolemia   . Hypertension   . Pulmonary embolism ([place])   . Renal disorder   . Stroke [facility])     PAST SURGICAL HISTORY: Past Surgical History:  Procedure Laterality Date  . BREAST SURGERY    . PARTIAL HYSTERECTOMY      FAMILY HISTORY Family History  Problem Relation Age of Onset  . Heart attack Mother   . Clotting disorder Neg Hx   The patient's father died from colorectal cancer at age 37. The patient's mother died at age 16 from heart disease. The patient has 3 brothers, 1 sister. The sister was diagnosed with breast cancer in her late 40s. The patient has a niece diagnosed with breast cancer at age 61.  GYNECOLOGIC HISTORY:  No LMP recorded. Patient has had  a hysterectomy. Menarche age 75, first live birth age 60. The patient is GX P5. She status post menopause but does not recall the date. She did not take hormone replacement. She never used oral contraceptives.  SOCIAL HISTORY:  The patient used to do gardening. She is originally from [place]. She is divorced. She is currently living with her daughter Carrie Watts  hearing [place]. 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 The patient's other children are either in [place] or [place]. The patient has 8 grandchildren. She is a Air traffic controller.    ADVANCED DIRECTIVES: In place   HEALTH MAINTENANCE: Social History  Substance Use Topics  . Smoking status: Never Smoker  . Smokeless tobacco: Never Used  . Alcohol use No     Colonoscopy: Never  PAP:  Bone density: Never   Allergies  Allergen Reactions  . Dust Mite Extract     No current facility-administered medications for this visit.    No current outpatient prescriptions on file.   Facility-Administered Medications Ordered in Other Visits  Medication Dose Route Frequency Provider Last Rate Last Dose  . apixaban (ELIQUIS) tablet 5 mg  5 mg Oral BID Alberteen Sam, MD   5 mg at 05/12/16 1005  . atorvastatin (LIPITOR) tablet 20 mg  20 mg Oral QHS Alberteen Sam, MD      . famotidine (PEPCID) tablet 10 mg  10 mg Oral Daily Alberteen Sam, MD   10 mg at 05/12/16 1005  . HYDROcodone-acetaminophen (NORCO/VICODIN) 5-325 MG per tablet 1-2 tablet  1-2 tablet Oral Q6H PRN Zannie Cove, MD      . insulin aspart (novoLOG) injection 0-5 Units  0-5 Units Subcutaneous QHS Alberteen Sam, MD      . insulin aspart (novoLOG) injection 0-9 Units  0-9 Units Subcutaneous TID WC Alberteen Sam, MD   2 Units at 05/12/16 1653  . metoprolol tartrate (LOPRESSOR) tablet 25 mg  25 mg Oral BID Zannie Cove, MD   25 mg at 05/12/16 1245  . morphine 2 MG/ML injection 1 mg  1 mg Intravenous Q3H PRN Zannie Cove, MD   1 mg at 05/12/16 1741  . pantoprazole (PROTONIX) EC tablet 40 mg  40 mg Oral Daily Alberteen Sam, MD   40 mg at 05/12/16 1005  . traMADol (ULTRAM) tablet 50 mg  50 mg Oral Q6H PRN Alberteen Sam, MD        OBJECTIVE: Middle-aged African-American woman who appears older than stated age, examined in a wheelchair Vitals:   05/11/16 1535  BP: 128/86  Pulse: (!) 113  Resp: 17    Temp: 98.1 F (36.7 C)     Body mass index is 33.04 kg/m.    ECOG FS:2 - Symptomatic, <50% confined to bed  Ocular: Sclerae unicteric, EOMs intact Ear-nose-throat: Oropharynx clear, no thrush Lymphatic: No cervical or supraclavicular adenopathy Lungs no rales or rhonchi Heart regular rate and rhythm Abd soft, nontender, positive bowel sounds MSK no focal spinal tenderness Neuro: non-focal and specifically she moves the right side of her body well., well-oriented, appropriate affect, speaks with some effort but clearly and dramatically Breasts: The right breast is unremarkable. The left breast is status post mastectomy. There is no evidence of chest wall recurrence. Left axilla is benign.   LAB RESULTS:  CMP     Component Value Date/Time   NA 136 05/11/2016 1832   NA 137 05/11/2016 1507   K 4.4 05/11/2016 1832   K 4.7 05/11/2016 1507   CL  102 05/11/2016 1832   CO2 21 (L) 05/11/2016 1832   CO2 22 05/11/2016 1507   GLUCOSE 129 (H) 05/11/2016 1832   GLUCOSE 114 05/11/2016 1507   BUN 10 05/11/2016 1832   BUN 11.6 05/11/2016 1507   CREATININE 0.95 05/11/2016 1832   CREATININE 0.9 05/11/2016 1507   CALCIUM 9.9 05/11/2016 1832   CALCIUM 10.2 05/11/2016 1507   PROT 7.4 05/11/2016 1507   ALBUMIN 2.8 (L) 05/11/2016 1507   AST 32 05/11/2016 1507   ALT 24 05/11/2016 1507   ALKPHOS 101 05/11/2016 1507   BILITOT 0.50 05/11/2016 1507   GFRNONAA >60 05/11/2016 1832   GFRAA >60 05/11/2016 1832    INo results found for: SPEP, UPEP  Lab Results  Component Value Date   WBC 8.2 05/11/2016   NEUTROABS 5.6 05/11/2016   HGB 9.7 (L) 05/11/2016   HCT 30.1 (L) 05/11/2016   MCV 96.2 05/11/2016   PLT 344 05/11/2016      Chemistry      Component Value Date/Time   NA 136 05/11/2016 1832   NA 137 05/11/2016 1507   K 4.4 05/11/2016 1832   K 4.7 05/11/2016 1507   CL 102 05/11/2016 1832   CO2 21 (L) 05/11/2016 1832   CO2 22 05/11/2016 1507   BUN 10 05/11/2016 1832   BUN 11.6  05/11/2016 1507   CREATININE 0.95 05/11/2016 1832   CREATININE 0.9 05/11/2016 1507      Component Value Date/Time   CALCIUM 9.9 05/11/2016 1832   CALCIUM 10.2 05/11/2016 1507   ALKPHOS 101 05/11/2016 1507   AST 32 05/11/2016 1507   ALT 24 05/11/2016 1507   BILITOT 0.50 05/11/2016 1507       No results found for: LABCA2  No components found for: LABCA125   Recent Labs Lab 05/11/16 2028  INR 1.44    Urinalysis No results found for: COLORURINE, APPEARANCEUR, LABSPEC, PHURINE, GLUCOSEU, HGBUR, BILIRUBINUR, KETONESUR, PROTEINUR, UROBILINOGEN, NITRITE, LEUKOCYTESUR   STUDIES: Ct Abdomen Pelvis Wo Contrast  Result Date: 05/05/2016 CLINICAL DATA:  Anemia. Gastrointestinal bleeding. Recent pulmonary embolus. EXAM: CT ABDOMEN AND PELVIS WITHOUT CONTRAST TECHNIQUE: Multidetector CT imaging of the abdomen and pelvis was performed following the standard protocol without IV contrast. COMPARISON:  None. FINDINGS: Lower chest: Mild dependent atelectasis at both lung bases. No pleural or pericardial fluid. Hepatobiliary: No focal or acute finding. No calcified gallstones. Question slight lobularity of the liver surface raising the possibility of early cirrhosis. Pancreas: Normal Spleen: Normal Adrenals/Urinary Tract: Adrenal glands are normal. Kidneys are normal. No cyst, mass, stone or hydronephrosis. Bladder is normal. Stomach/Bowel: No acute bowel finding. Normal appearing appendix. Diverticulosis of the descending and sigmoid colon without definite diverticulitis. Low level diverticulitis can be inapparent at imaging. Vascular/Lymphatic: Normal Reproductive: Previous hysterectomy. Other: No free fluid or air Musculoskeletal: Negative IMPRESSION: No acute finding by CT. No lesions seen to explain gastrointestinal bleeding. The patient does have diverticulosis which can be associated with bleeding. No convincing diverticulitis. Low level diverticulitis can be inapparent at imaging. Question early  cirrhosis. Electronically Signed   By: Nelson Chimes M.D.   On: 05/05/2016 18:00   Dg Chest 2 View  Result Date: 05/11/2016 CLINICAL DATA:  Left-sided chest pain extending to the mandible and shoulder. Cough. Elevated troponin. EXAM: CHEST  2 VIEW COMPARISON:  05/11/2016 at 14:11 FINDINGS: Persistent left base opacity is unchanged and could represent pneumonia or evolving pulmonary infarction. Right lung is clear. Pulmonary vasculature is normal. Hilar and mediastinal contours are unremarkable and  unchanged. Mild cardiomegaly, unchanged. IMPRESSION: Left base opacity, with considerations including pneumonia, pulmonary infarction, aspiration. Normal pulmonary vasculature. Electronically Signed   By: Andreas Newport M.D.   On: 05/11/2016 19:49   Dg Chest 2 View  Result Date: 05/11/2016 CLINICAL DATA:  Acute onset of shortness of breath and mid chest pain. Initial encounter. EXAM: CHEST  2 VIEW COMPARISON:  Chest radiograph and CTA of the chest performed 04/24/2016 FINDINGS: The lungs are well-aerated. Mild left basilar airspace opacity raises question for pneumonia. There is no evidence of pleural effusion or pneumothorax. The heart is borderline normal in size. No acute osseous abnormalities are seen. IMPRESSION: Mild left basilar airspace opacity raises question for pneumonia. Residual pulmonary infarct may have a similar appearance, given recent saddle pulmonary embolus. Electronically Signed   By: Garald Balding M.D.   On: 05/11/2016 16:19   Dg Chest 2 View  Result Date: 04/24/2016 CLINICAL DATA:  Cough. History of hypertension and CHF. History of breast cancer. Patient has had loss of function on the right side over body and has difficulty speaking. EXAM: CHEST  2 VIEW COMPARISON:  None. FINDINGS: Normal heart size and pulmonary vascularity. No focal airspace disease or consolidation in the lungs. No blunting of costophrenic angles. No pneumothorax. Mediastinal contours appear intact. Surgical clips  in the left axilla. Degenerative changes in the spine. IMPRESSION: No active cardiopulmonary disease. Electronically Signed   By: Lucienne Capers M.D.   On: 04/24/2016 01:33   Ct Head Wo Contrast  Result Date: 04/24/2016 CLINICAL DATA:  Acute onset of slurred speech and bilateral leg weakness. Initial encounter. EXAM: CT HEAD WITHOUT CONTRAST TECHNIQUE: Contiguous axial images were obtained from the base of the skull through the vertex without intravenous contrast. COMPARISON:  None. FINDINGS: Brain: No evidence of acute infarction, hemorrhage, hydrocephalus, extra-axial collection or mass lesion/mass effect. Prominence of the ventricles and sulci reflects mild cortical volume loss. Scattered periventricular subcortical white matter change likely reflects small vessel ischemic microangiopathy. A small chronic lacunar infarct is noted at the right basal ganglia. The brainstem and fourth ventricle are within normal limits. The cerebral hemispheres demonstrate grossly normal gray-white differentiation. No mass effect or midline shift is seen. Vascular: No hyperdense vessel or unexpected calcification. Skull: There is no evidence of fracture; visualized osseous structures are unremarkable in appearance. Sinuses/Orbits: The orbits are within normal limits. The paranasal sinuses and mastoid air cells are well-aerated. Other: No significant soft tissue abnormalities are seen. IMPRESSION: 1. No acute intracranial pathology seen on CT. 2. Mild cortical volume loss and scattered small vessel ischemic microangiopathy. 3. Small chronic lacunar infarct at the right basal ganglia. Electronically Signed   By: Garald Balding M.D.   On: 04/24/2016 01:54   Ct Angio Chest Pe W And/or Wo Contrast  Result Date: 05/11/2016 CLINICAL DATA:  Cough, shortness of breath. Recent saddle pulmonary embolus. Now with LEFT pulmonary infiltrate, assess for infarct. History of breast cancer, diabetes, CHF. EXAM: CT ANGIOGRAPHY CHEST WITH  CONTRAST TECHNIQUE: Multidetector CT imaging of the chest was performed using the standard protocol during bolus administration of intravenous contrast. Multiplanar CT image reconstructions and MIPs were obtained to evaluate the vascular anatomy. CONTRAST:  80 cc Isovue 370. COMPARISON:  CT chest April 24, 2016. FINDINGS: CARDIOVASCULAR: Adequate contrast opacification of the pulmonary artery's. Main pulmonary artery is not enlarged. Residual pulmonary emboli LEFT lobar pulmonary artery's, predominately central in distribution and occlusive an LEFT lower lobe. Dissolution of the saddle embolic component. Residual nonocclusive embolus RIGHT middle lobar  artery. RIGHT lower lobe pulmonary artery filling defect is now peripheral compatible with early chronicity, propagating to the segmental artery's without occlusion. Heart size is mildly enlarged, RV/ LV equals 0.9, previously 1.2. No pericardial effusions. Thoracic aorta is normal course and caliber, trace calcific atherosclerosis. MEDIASTINUM/NODES: No lymphadenopathy by CT size criteria. LUNGS/PLEURA: Tracheobronchial tree is patent, no pneumothorax. Bilateral lower lobe and lingular enhancing atelectasis without wedge-like consolidation to suggest infarct. No pleural effusion. UPPER ABDOMEN: Included view of the abdomen is unremarkable. MUSCULOSKELETAL: Visualized soft tissues and included osseous structures are nonacute. Broad dextroscoliosis. Mild old T3 and T8 for compression fractures. Review of the MIP images confirms the above findings. IMPRESSION: No new pulmonary emboli. Dissolving, decreased clot burden from prior CT. No pulmonary infarcts. Bibasilar atelectasis. Mild cardiomegaly. Borderline, improved RIGHT heart strain (RV/LV 0.9, previously 1.2). Electronically Signed   By: Elon Alas M.D.   On: 05/11/2016 23:07   Ct Angio Chest Pe W And/or Wo Contrast  Result Date: 04/24/2016 CLINICAL DATA:  Cough, chest pain, and shortness of breath for  2 weeks. History of breast cancer post left mastectomy. Chemotherapy. Multiple recent strokes. Diabetes. EXAM: CT ANGIOGRAPHY CHEST WITH CONTRAST TECHNIQUE: Multidetector CT imaging of the chest was performed using the standard protocol during bolus administration of intravenous contrast. Multiplanar CT image reconstructions and MIPs were obtained to evaluate the vascular anatomy. CONTRAST:  100 mL Isovue 370 COMPARISON:  None. FINDINGS: Cardiovascular: Filling defects demonstrated in the main and bilateral lobar pulmonary artery is extending into bilateral upper and lower lobe branches consistent with saddle embolus with moderately large clot burden. The RV to LV ratio is elevated at 1.19, suggesting risk of right heart strain. No pericardial effusion. Dilated ascending thoracic aorta at 3.5 cm. No dissection. Scattered calcification. Mediastinum/Nodes: Small esophageal hiatal hernia. Esophagus is decompressed. No significant lymphadenopathy in the chest. Lungs/Pleura: Evaluation is limited due to motion artifact. There patchy nodular ground-glass infiltrative changes demonstrated centrally in the lungs, in the lung bases, and scattered throughout the upper lungs. This may be due to multifocal pneumonia, edema, or infarcts. Metastatic disease would be a less likely consideration. No pleural effusions. No pneumothorax. Upper Abdomen: No acute abnormality. Musculoskeletal: Postoperative changes consistent with left mastectomy and surgical clips in the left axilla. No destructive bone lesions. Review of the MIP images confirms the above findings. IMPRESSION: Positive examination for pulmonary embolus, including saddle embolus and bilateral lobar and segmental pulmonary emboli. Moderately large clot burden. Positive for acute PE with CT evidence of right heart strain (RV/LV Ratio = 1.19) consistent with at least submassive (intermediate risk) PE. The presence of right heart strain has been associated with an increased  risk of morbidity and mortality. Please activate Code PE by paging [phone]. Patchy nodular ground-glass infiltrative changes in the lungs may be due to multifocal pneumonia, edema, or infarcts. Metastatic disease would be a less likely consideration but due to history of primary carcinoma, non-contrast chest CT at 3-6 months is recommended. If nodules persist, subsequent management will be based upon the most suspicious nodule(s). This recommendation follows the consensus statement: Guidelines for Management of Incidental Pulmonary Nodules Detected on CT Images: From the Fleischner Society 2017; Radiology 2017; 284:228-243. These results were called by telephone at the time of interpretation on 04/24/2016 at 4:26 am to Dr. Deno Etienne , who verbally acknowledged these results. Electronically Signed   By: Lucienne Capers M.D.   On: 04/24/2016 04:32   Mr Brain Wo Contrast  Result Date: 04/24/2016 CLINICAL DATA:  Right-sided facial droop and difficulty speaking, symptoms for 1 week. History of breast cancer currently on chemotherapy. Large saddle embolus on CT chest. Query paradoxical emboli. History of diabetes. EXAM: MRI HEAD WITHOUT CONTRAST MRA HEAD WITHOUT CONTRAST TECHNIQUE: Multiplanar, multiecho pulse sequences of the brain and surrounding structures were obtained without intravenous contrast. Angiographic images of the head were obtained using MRA technique without contrast. COMPARISON:  CT chest 04/24/2016.  CT head 04/24/2016. FINDINGS: MRI HEAD FINDINGS Brain: No evidence for acute infarction, hemorrhage, mass lesion, hydrocephalus, or extra-axial fluid. Moderate atrophy. Advanced T2 and FLAIR hyperintensities throughout the white matter, representing either diabetic related chronic microvascular ischemic change or post treatment effect from chemotherapy. Scattered areas of lacunar infarction in the deep nuclei and subcortical white matter. Brainstem and cerebellum spared. 9 mm of tonsillar descent  below the foramen magnum consistent with Chiari I malformation. No visible hydromyelia. Partial empty sella. Vascular: Normal flow voids. Skull and upper cervical spine: Normal marrow signal. Large disc extrusion at C3-4, evaluated only on sagittal images, causing significant cord compression. Consider cervical spine MRI for further evaluation. Sinuses/Orbits: Negative. Other: None. MRA HEAD FINDINGS The internal carotid arteries are widely patent. Basilar artery is widely patent with vertebrals codominant. There is no intracranial stenosis or visible aneurysm. IMPRESSION: Moderate atrophy with advanced nonacute white matter signal abnormality. No visible acute stroke. Chiari I malformation. 9 mm tonsillar descent without visible hydromyelia. Within limits for assessment on noncontrast brain, no intracranial metastatic disease or visible osseous disease. No intracranial stenosis or occlusion. Electronically Signed   By: Elsie Stain M.D.   On: 04/24/2016 13:34   Mr Maxine Glenn Head/brain JQ Cm  Result Date: 04/24/2016 CLINICAL DATA:  Right-sided facial droop and difficulty speaking, symptoms for 1 week. History of breast cancer currently on chemotherapy. Large saddle embolus on CT chest. Query paradoxical emboli. History of diabetes. EXAM: MRI HEAD WITHOUT CONTRAST MRA HEAD WITHOUT CONTRAST TECHNIQUE: Multiplanar, multiecho pulse sequences of the brain and surrounding structures were obtained without intravenous contrast. Angiographic images of the head were obtained using MRA technique without contrast. COMPARISON:  CT chest 04/24/2016.  CT head 04/24/2016. FINDINGS: MRI HEAD FINDINGS Brain: No evidence for acute infarction, hemorrhage, mass lesion, hydrocephalus, or extra-axial fluid. Moderate atrophy. Advanced T2 and FLAIR hyperintensities throughout the white matter, representing either diabetic related chronic microvascular ischemic change or post treatment effect from chemotherapy. Scattered areas of lacunar  infarction in the deep nuclei and subcortical white matter. Brainstem and cerebellum spared. 9 mm of tonsillar descent below the foramen magnum consistent with Chiari I malformation. No visible hydromyelia. Partial empty sella. Vascular: Normal flow voids. Skull and upper cervical spine: Normal marrow signal. Large disc extrusion at C3-4, evaluated only on sagittal images, causing significant cord compression. Consider cervical spine MRI for further evaluation. Sinuses/Orbits: Negative. Other: None. MRA HEAD FINDINGS The internal carotid arteries are widely patent. Basilar artery is widely patent with vertebrals codominant. There is no intracranial stenosis or visible aneurysm. IMPRESSION: Moderate atrophy with advanced nonacute white matter signal abnormality. No visible acute stroke. Chiari I malformation. 9 mm tonsillar descent without visible hydromyelia. Within limits for assessment on noncontrast brain, no intracranial metastatic disease or visible osseous disease. No intracranial stenosis or occlusion. Electronically Signed   By: Elsie Stain M.D.   On: 04/24/2016 13:34    ELIGIBLE FOR AVAILABLE RESEARCH PROTOCOL: no  ASSESSMENT: 66 y.o. [place] woman originally from [place], status post left mastectomy and sentinel lymph node sampling 07/29/2015 for a pT2 pN1, stage IIB  invasive breast cancer, mixed ductal and lobular, strongly estrogen and progesterone receptor positive, HER-2 equivocal  (1) she received adjuvant chemotherapy consisting (most likely) of cyclophosphamide and doxorubicin 4 given between October and December 2017, then started paclitaxel 04/06/2016, discontinued after one dose because of neuropathy and other complications  (2) consider adjuvant radiation  (3) anastrozole started 05/11/2016  (4) pulmonary embolus and right lower extremity deep vein thrombosis diagnosed 04/24/2016,  (a) status post enoxaparin 04/24/2016 through 05/03/2016  (b) transitioned to apixaban  05/03/2016  PLAN: We spent the better part of today's hour-long appointment discussing the biology of breast cancer in general, and the specifics of the patient's tumor in particular. Lina's situation is complex partly because we do not have all her records and specifically we do not have a [facility] study for her equivocal HER-2 immunohistochemistry. A second issue is the fact that her adjuvant treatment was delayed several months because of postoperative complications.  To the best of my ability to reconstruct her story, I believe she received standard AC/T, with the T discontinued because of the change to [facility] and the development of pulmonary emboli. In addition she has grade 1-2 peripheral neuropathy and I feel on that reason alone additional taxanes therapy is contraindicated. I reassured her that the benefit of Taxol in addition to Cytoxan and Adriamycin is measurable but small so she is not losing much in terms of risk reduction by omitting that drug at this point  We normally do postoperative radiation in node-positive patients. She is now 9 months out from her original surgery and I don't think there is very good data for adjuvant radiation at this point. Radiation also is a local form of treatment that it would not be expected to significantly affect survival. Accordingly I don't think she is a good candidate for radiation at this point but I prefer for that decision to be discussed with her by the radiation oncologist directly and I'm setting her up for a visit with him with that in mind. My expectation now is that she likely will not receive radiation  We then discussed anti-estrogens. Given her recent pulmonary embolus, she is not a candidate for tamoxifen. She is a good candidate for anastrozole. We discussed the possible toxicities side effects and complications as well as the benefits of that agent. I have gone ahead and written her a prescription and she will started tonight.  She still  having chest wall pain related to her pulmonary embolus and I refilled her tramadol today. Her blood sugars seem to be doing well. She already has an appointment with a new primary care physician next week and they can follow-up on those issues further at that point.  I have made her a return appointment with me for March 20. Before that visit she will have a repeat right mammogram and a baseline bone density.  They know to call for any problems that may develop before the next visit here.  Chauncey Cruel, MD   05/12/2016 6:19 PM Medical Oncology and Hematology [facility] [address] [place], [place] [address] Tel. [phone]    Fax. [phone]

## 2016-05-11 NOTE — Telephone Encounter (Signed)
Name and DOB verified. Pt aware of lab results. Had concerns about having referrals for Podiatry for foot exam, opthalmology diabetic retinopathy, GI specialist for colonoscopy, and bone scan. Pt informed of referral process and instructed to have concerns for specialist addressed with Dr. Janne Napoleon on next week at apt.

## 2016-05-11 NOTE — ED Notes (Signed)
Notified dr Ralene Bathe of abnormal lab, troponin 0.13

## 2016-05-11 NOTE — ED Notes (Signed)
Patient dropped to 88% O2 on room air.  Patient is on 2L nasal cannula and O2 is at 98%.  Still tachypenic and tachycardic

## 2016-05-11 NOTE — Telephone Encounter (Signed)
Patient returning call to nurse....please call back

## 2016-05-11 NOTE — ED Notes (Signed)
Patient returned to room and placed back on monitor.  

## 2016-05-12 ENCOUNTER — Encounter (HOSPITAL_COMMUNITY): Payer: Self-pay | Admitting: Family Medicine

## 2016-05-12 ENCOUNTER — Encounter: Payer: Self-pay | Admitting: Oncology

## 2016-05-12 ENCOUNTER — Observation Stay (HOSPITAL_BASED_OUTPATIENT_CLINIC_OR_DEPARTMENT_OTHER): Payer: Medicare Other

## 2016-05-12 DIAGNOSIS — Z86718 Personal history of other venous thrombosis and embolism: Secondary | ICD-10-CM | POA: Diagnosis not present

## 2016-05-12 DIAGNOSIS — I5023 Acute on chronic systolic (congestive) heart failure: Secondary | ICD-10-CM | POA: Diagnosis not present

## 2016-05-12 DIAGNOSIS — Z79811 Long term (current) use of aromatase inhibitors: Secondary | ICD-10-CM | POA: Diagnosis not present

## 2016-05-12 DIAGNOSIS — K219 Gastro-esophageal reflux disease without esophagitis: Secondary | ICD-10-CM | POA: Diagnosis not present

## 2016-05-12 DIAGNOSIS — Z23 Encounter for immunization: Secondary | ICD-10-CM | POA: Diagnosis not present

## 2016-05-12 DIAGNOSIS — E119 Type 2 diabetes mellitus without complications: Secondary | ICD-10-CM

## 2016-05-12 DIAGNOSIS — D122 Benign neoplasm of ascending colon: Secondary | ICD-10-CM | POA: Diagnosis not present

## 2016-05-12 DIAGNOSIS — D638 Anemia in other chronic diseases classified elsewhere: Secondary | ICD-10-CM | POA: Diagnosis not present

## 2016-05-12 DIAGNOSIS — J9601 Acute respiratory failure with hypoxia: Secondary | ICD-10-CM | POA: Diagnosis not present

## 2016-05-12 DIAGNOSIS — I110 Hypertensive heart disease with heart failure: Secondary | ICD-10-CM | POA: Diagnosis not present

## 2016-05-12 DIAGNOSIS — C50912 Malignant neoplasm of unspecified site of left female breast: Secondary | ICD-10-CM | POA: Diagnosis not present

## 2016-05-12 DIAGNOSIS — I272 Pulmonary hypertension, unspecified: Secondary | ICD-10-CM | POA: Diagnosis not present

## 2016-05-12 DIAGNOSIS — R0902 Hypoxemia: Secondary | ICD-10-CM | POA: Diagnosis present

## 2016-05-12 DIAGNOSIS — R0600 Dyspnea, unspecified: Secondary | ICD-10-CM | POA: Diagnosis not present

## 2016-05-12 DIAGNOSIS — I429 Cardiomyopathy, unspecified: Secondary | ICD-10-CM | POA: Diagnosis not present

## 2016-05-12 DIAGNOSIS — I25119 Atherosclerotic heart disease of native coronary artery with unspecified angina pectoris: Secondary | ICD-10-CM | POA: Diagnosis not present

## 2016-05-12 DIAGNOSIS — I2602 Saddle embolus of pulmonary artery with acute cor pulmonale: Secondary | ICD-10-CM | POA: Diagnosis not present

## 2016-05-12 DIAGNOSIS — D649 Anemia, unspecified: Secondary | ICD-10-CM | POA: Diagnosis not present

## 2016-05-12 DIAGNOSIS — M109 Gout, unspecified: Secondary | ICD-10-CM | POA: Diagnosis not present

## 2016-05-12 DIAGNOSIS — E1165 Type 2 diabetes mellitus with hyperglycemia: Secondary | ICD-10-CM | POA: Diagnosis not present

## 2016-05-12 DIAGNOSIS — R079 Chest pain, unspecified: Secondary | ICD-10-CM | POA: Diagnosis present

## 2016-05-12 DIAGNOSIS — I248 Other forms of acute ischemic heart disease: Secondary | ICD-10-CM | POA: Diagnosis not present

## 2016-05-12 LAB — GLUCOSE, CAPILLARY
GLUCOSE-CAPILLARY: 102 mg/dL — AB (ref 65–99)
GLUCOSE-CAPILLARY: 98 mg/dL (ref 65–99)
GLUCOSE-CAPILLARY: 166 mg/dL — AB (ref 65–99)
Glucose-Capillary: 153 mg/dL — ABNORMAL HIGH (ref 65–99)
Glucose-Capillary: 138 mg/dL — ABNORMAL HIGH (ref 65–99)

## 2016-05-12 LAB — ECHOCARDIOGRAM COMPLETE
Height: 62 in
WEIGHTICAEL: 2942.4 [oz_av]

## 2016-05-12 LAB — INFLUENZA PANEL BY PCR (TYPE A & B)
INFLBPCR: NEGATIVE
Influenza A By PCR: NEGATIVE

## 2016-05-12 LAB — TROPONIN I: Troponin I: 0.1 ng/mL (ref <0.03)

## 2016-05-12 LAB — FERRITIN: Ferritin: 979 ng/ml — ABNORMAL HIGH (ref 9–269)

## 2016-05-12 MED ORDER — ACETAMINOPHEN 500 MG PO TABS
1000.0000 mg | ORAL_TABLET | Freq: Three times a day (TID) | ORAL | Status: DC
  Administered 2016-05-12: 1000 mg via ORAL
  Filled 2016-05-12: qty 2

## 2016-05-12 MED ORDER — ATORVASTATIN CALCIUM 20 MG PO TABS
20.0000 mg | ORAL_TABLET | Freq: Every day | ORAL | Status: DC
  Administered 2016-05-12 – 2016-05-21 (×10): 20 mg via ORAL
  Filled 2016-05-12 (×10): qty 1

## 2016-05-12 MED ORDER — METFORMIN HCL 500 MG PO TABS
1000.0000 mg | ORAL_TABLET | Freq: Two times a day (BID) | ORAL | Status: DC
Start: 2016-05-12 — End: 2016-05-12
  Administered 2016-05-12: 1000 mg via ORAL
  Filled 2016-05-12: qty 2

## 2016-05-12 MED ORDER — INSULIN ASPART 100 UNIT/ML ~~LOC~~ SOLN
0.0000 [IU] | Freq: Three times a day (TID) | SUBCUTANEOUS | Status: DC
  Administered 2016-05-12 (×2): 2 [IU] via SUBCUTANEOUS
  Administered 2016-05-13: 1 [IU] via SUBCUTANEOUS
  Administered 2016-05-13: 2 [IU] via SUBCUTANEOUS

## 2016-05-12 MED ORDER — TRAMADOL HCL 50 MG PO TABS
50.0000 mg | ORAL_TABLET | Freq: Four times a day (QID) | ORAL | Status: DC | PRN
  Filled 2016-05-12: qty 1

## 2016-05-12 MED ORDER — AZITHROMYCIN 250 MG PO TABS: 250.0000 mg | ORAL_TABLET | Freq: Every day | ORAL | Status: DC

## 2016-05-12 MED ORDER — FAMOTIDINE 20 MG PO TABS
10.0000 mg | ORAL_TABLET | Freq: Every day | ORAL | Status: DC
  Administered 2016-05-12 – 2016-05-22 (×11): 10 mg via ORAL
  Filled 2016-05-12 (×11): qty 1

## 2016-05-12 MED ORDER — AZITHROMYCIN 250 MG PO TABS
250.0000 mg | ORAL_TABLET | Freq: Every day | ORAL | Status: DC
  Administered 2016-05-12: 250 mg via ORAL
  Filled 2016-05-12: qty 1

## 2016-05-12 MED ORDER — INSULIN ASPART 100 UNIT/ML ~~LOC~~ SOLN
0.0000 [IU] | Freq: Every day | SUBCUTANEOUS | Status: DC
  Administered 2016-05-19: 2 [IU] via SUBCUTANEOUS
  Administered 2016-05-20: 3 [IU] via SUBCUTANEOUS
  Administered 2016-05-21: 2 [IU] via SUBCUTANEOUS

## 2016-05-12 MED ORDER — METOPROLOL TARTRATE 25 MG PO TABS
25.0000 mg | ORAL_TABLET | Freq: Two times a day (BID) | ORAL | Status: DC
  Administered 2016-05-12 – 2016-05-14 (×5): 25 mg via ORAL
  Filled 2016-05-12 (×5): qty 1

## 2016-05-12 MED ORDER — AZITHROMYCIN 250 MG PO TABS
250.0000 mg | ORAL_TABLET | Freq: Every day | ORAL | Status: DC
Start: 2016-05-13 — End: 2016-05-16
  Administered 2016-05-13 – 2016-05-15 (×3): 250 mg via ORAL
  Filled 2016-05-12 (×3): qty 1

## 2016-05-12 MED ORDER — APIXABAN 5 MG PO TABS
5.0000 mg | ORAL_TABLET | Freq: Two times a day (BID) | ORAL | Status: DC
  Administered 2016-05-12 – 2016-05-13 (×4): 5 mg via ORAL
  Filled 2016-05-12 (×4): qty 1

## 2016-05-12 MED ORDER — HYDROCODONE-ACETAMINOPHEN 5-325 MG PO TABS
1.0000 | ORAL_TABLET | Freq: Four times a day (QID) | ORAL | Status: DC | PRN
  Administered 2016-05-12 – 2016-05-19 (×2): 1 via ORAL
  Filled 2016-05-12 (×2): qty 1

## 2016-05-12 MED ORDER — MORPHINE SULFATE (PF) 2 MG/ML IV SOLN
1.0000 mg | INTRAVENOUS | Status: DC | PRN
  Administered 2016-05-12 – 2016-05-13 (×2): 1 mg via INTRAVENOUS
  Filled 2016-05-12 (×2): qty 1

## 2016-05-12 MED ORDER — PANTOPRAZOLE SODIUM 40 MG PO TBEC
40.0000 mg | DELAYED_RELEASE_TABLET | Freq: Every day | ORAL | Status: DC
  Administered 2016-05-12 – 2016-05-13 (×2): 40 mg via ORAL
  Filled 2016-05-12 (×2): qty 1

## 2016-05-12 MED ORDER — ANASTROZOLE 1 MG PO TABS
1.0000 mg | ORAL_TABLET | Freq: Every day | ORAL | Status: DC
  Administered 2016-05-12 – 2016-05-22 (×11): 1 mg via ORAL
  Filled 2016-05-12 (×13): qty 1

## 2016-05-12 NOTE — Progress Notes (Signed)
Notified Dr. Broadus John of pt's heart rate in 140 and down to 130 with movement.  Also c/o chest pain declined pain med.  Will continue to monitor.  Karie Kirks, Therapist, sports.

## 2016-05-12 NOTE — H&P (Signed)
History and Physical  Patient Name: Carrie Watts     [id number]:[id number]    DOB: [birth year]    DOA: 05/11/2016 PCP: No PCP Per Patient   Patient coming from: Oncology clinic  Chief Complaint: Chest pain, shortness of breath  HPI: Carrie Watts is a 66 y.o. female with a past medical history significant for breast cancer, recent saddle PE and DVT, and new diabetes who presents with chest pain.  The patient developed chest pain and stroke-like symptoms about two weeks before she flew to the [place] to visit her daughter at the beginning of January.  She was admitted here on 1/14 and diagnosed with new saddle PE with right heart strain and started on Lovenox, transitioned to apixaban at discharge.  Since discharge, she has been taking no OTC analgesics, and has not been able to obtain the prescription analgesic she was prescribed.  She has obtained her apixaban and is taking it as prescribed (although she told pharmacy tech she had just transitioned to *once* daily dosing).  Since discharge, she has had several flares of severe left sided chest pain, sharp and severe, worse with coughing or deep breathing and associated with feeling she can't catch her breath.  She also endorses some left sided headache, but no fever, chills, sputum, hemoptysis.  Today she went to Dr. Virgie Dad office, where a CXR appeared to show left lower infiltrate and so she was referred to the ER.  ED course: -Afebrile, heart rate 78, respirations 16, BP 120/90, pulse ox 96% on room air, desaturation with exertion -Na 137, K 4.7, Cr 0.9, WBC 7.7K, Hgb 9.3 stable -Troponin trended down to 0.13 -INR 1.4 -CTA was repeated and showed resolving clot burden, atelectasis in LLL without clear wedge-shaped opacities to suggest infarct  -She was given medicine for pain and TRH were asked to evaluate for hypoxia    She is from [place] where she received her treatment for breast cancer and where she now lives.  She plans to return  there "whenever this is all better".     ROS: Review of Systems  Constitutional: Negative for chills, fever and malaise/fatigue.  HENT: Positive for ear pain.   Respiratory: Positive for cough and shortness of breath.   Cardiovascular: Positive for chest pain.  Neurological: Positive for headaches.  All other systems reviewed and are negative.         Past Medical History:  Diagnosis Date  . Cancer (HCC)    breast  . CHF (congestive heart failure) ([place])   . Diabetes mellitus without complication ([place])   . Hypertension   . Renal disorder     Past Surgical History:  Procedure Laterality Date  . 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  Visiting her daughter in the [place].  The patient walks unassisted.  She does not somke.    Allergies  Allergen Reactions  . Dust Mite Extract     Family history: family history includes Heart attack in her mother.  Prior to Admission medications   Medication Sig Start Date End Date Taking? Authorizing Provider  apixaban (ELIQUIS) 5 MG TABS tablet Take 1 tablet (5 mg total) by mouth 2 (two) times daily. Patient taking differently: Take 5 mg by mouth daily.  05/11/16  Yes Thurnell Lose, MD  atorvastatin (LIPITOR) 20 MG tablet Take 1 tablet (20 mg total) by mouth at bedtime. Can switch to a cheaper  statin if needed 05/03/16  Yes Thurnell Lose, MD  folic acid (FOLVITE) 226 MCG tablet Take 800 mcg by mouth daily.   Yes Historical Provider, MD  metFORMIN (GLUCOPHAGE) 1000 MG tablet Take 1 tablet (1,000 mg total) by mouth 2 (two) times daily with a meal. 05/03/16  Yes Thurnell Lose, MD  Multiple Vitamin (MULTIVITAMIN WITH MINERALS) TABS tablet Take 1 tablet by mouth daily.   Yes Historical Provider, MD  omeprazole (PRILOSEC) 20 MG capsule Take 20 mg by mouth daily.   Yes Historical Provider, MD  ranitidine (ZANTAC) 300 MG tablet Take 300 mg by mouth at bedtime.   Yes  Historical Provider, MD  apixaban (ELIQUIS) 5 MG TABS tablet Take 2 tablets (10 mg total) by mouth 2 (two) times daily. 05/03/16 05/10/16  Thurnell Lose, MD  glucose monitoring kit (FREESTYLE) monitoring kit 1 each by Does not apply route 4 (four) times daily - after meals and at bedtime. 1 month Diabetic Testing Supplies for QAC-QHS accuchecks.  Diagnosis E11.65. Any brand OK 05/03/16   Thurnell Lose, MD  traMADol (ULTRAM) 50 MG tablet Take 1 tablet (50 mg total) by mouth every 6 (six) hours as needed. Patient not taking: Reported on 05/11/2016 05/11/16   Chauncey Cruel, MD       Physical Exam: BP 127/82 (BP Location: Right Arm)   Pulse (!) 102   Temp 98.5 F (36.9 C) (Oral)   Resp (!) 22   Ht _0  (1.575 m)   Wt 83.4 kg (183 lb 14.4 oz) Comment: scale a  SpO2 100%   BMI 33.64 kg/m  General appearance: Well-developed, adult female, alert and in mild distress from pain, occasionally has shooting pains.   Eyes: Anicteric, conjunctiva pink, lids and lashes normal. PERRL.    ENT: No nasal deformity, discharge, epistaxis.  Hearing normal. OP moist without lesions.   Neck: No neck masses.  Trachea midline.  No thyromegaly/tenderness. Lymph: No cervical or supraclavicular lymphadenopathy. Skin: Warm and dry.  No jaundice.  No suspicious rashes or lesions. Cardiac: RRR, nl S1-S2, no murmurs appreciated.  Capillary refill is brisk.  JVP normal.  No LE edema.  Radial and DP pulses 2+ and symmetric. Respiratory: Normal respiratory rate and rhythm.  No wheezes.  Shallow inspiratory effort.  Atelectasis at bases. Abdomen: Abdomen soft.  No TTP. No ascites, distension, hepatosplenomegaly.   MSK: No deformities or effusions.  No cyanosis or clubbing. Neuro: Cranial nerves normal.  Sensation intact to light touch. Speech is fluent.  Muscle strength normal.    Psych: Sensorium intact and responding to questions, attention normal.  Behavior appropriate.  Affect normal, appears in pain.  Judgment  and insight appear normal.     Labs on Admission:  I have personally reviewed following labs and imaging studies: CBC:  Recent Labs Lab 05/05/16 1257 05/09/16 1518 05/11/16 1507 05/11/16 1832  WBC 8.8 7.8 7.7 8.2  NEUTROABS 6,688 5,382 5.6  --   HGB 8.6* 9.6* 9.3* 9.7*  HCT 27.3* 30.8* 29.4* 30.1*  MCV 96.5 100.0 96.7 96.2  PLT 275 375 313 333   Basic Metabolic Panel:  Recent Labs Lab 05/05/16 1257 05/11/16 1507 05/11/16 1832  NA 136 137 136  K 4.2 4.7 4.4  CL 104  --  102  CO2 19* 22 21*  GLUCOSE 86 114 129*  BUN 10 11.6 10  CREATININE 0.75 0.9 0.95  CALCIUM 9.0 10.2 9.9   GFR: Estimated Creatinine Clearance: 57.5 mL/min (by C-G formula  based on SCr of 0.95 mg/dL).  Liver Function Tests:  Recent Labs Lab 05/05/16 1257 05/11/16 1507  AST 41* 32  ALT 28 24  ALKPHOS 88 101  BILITOT 0.6 0.50  PROT 6.7 7.4  ALBUMIN 3.2* 2.8*   No results for input(s): LIPASE, AMYLASE in the last 168 hours. No results for input(s): AMMONIA in the last 168 hours. Coagulation Profile:  Recent Labs Lab 05/11/16 2028  INR 1.44   Cardiac Enzymes: No results for input(s): CKTOTAL, CKMB, CKMBINDEX, TROPONINI in the last 168 hours. BNP (last 3 results) No results for input(s): PROBNP in the last 8760 hours. HbA1C: No results for input(s): HGBA1C in the last 72 hours. CBG: No results for input(s): GLUCAP in the last 168 hours. Lipid Profile: No results for input(s): CHOL, HDL, LDLCALC, TRIG, CHOLHDL, LDLDIRECT in the last 72 hours. Thyroid Function Tests: No results for input(s): TSH, T4TOTAL, FREET4, T3FREE, THYROIDAB in the last 72 hours. Anemia Panel:  Recent Labs  05/09/16 1518 05/11/16 1507  VITAMINB12 455  --   FOLATE >24.0  --   RETICCTPCT  --  8.10*   Sepsis Labs: Invalid input(s): PROCALCITONIN, LACTICIDVEN Recent Results (from the past 240 hour(s))  C difficile quick scan w PCR reflex     Status: None   Collection Time: 05/02/16  9:44 AM  Result  Value Ref Range Status   C Diff antigen NEGATIVE NEGATIVE Final   C Diff toxin NEGATIVE NEGATIVE Final   C Diff interpretation No C. difficile detected.  Final    Comment: NEGATIVE         Radiological Exams on Admission: Personally reviewed CXR shows trace L base opacity; CTA report reviewed: Dg Chest 2 View  Result Date: 05/11/2016 CLINICAL DATA:  Left-sided chest pain extending to the mandible and shoulder. Cough. Elevated troponin. EXAM: CHEST  2 VIEW COMPARISON:  05/11/2016 at 14:11 FINDINGS: Persistent left base opacity is unchanged and could represent pneumonia or evolving pulmonary infarction. Right lung is clear. Pulmonary vasculature is normal. Hilar and mediastinal contours are unremarkable and unchanged. Mild cardiomegaly, unchanged. IMPRESSION: Left base opacity, with considerations including pneumonia, pulmonary infarction, aspiration. Normal pulmonary vasculature. Electronically Signed   By: Andreas Newport M.D.   On: 05/11/2016 19:49   Dg Chest 2 View  Result Date: 05/11/2016 CLINICAL DATA:  Acute onset of shortness of breath and mid chest pain. Initial encounter. EXAM: CHEST  2 VIEW COMPARISON:  Chest radiograph and CTA of the chest performed 04/24/2016 FINDINGS: The lungs are well-aerated. Mild left basilar airspace opacity raises question for pneumonia. There is no evidence of pleural effusion or pneumothorax. The heart is borderline normal in size. No acute osseous abnormalities are seen. IMPRESSION: Mild left basilar airspace opacity raises question for pneumonia. Residual pulmonary infarct may have a similar appearance, given recent saddle pulmonary embolus. Electronically Signed   By: Garald Balding M.D.   On: 05/11/2016 16:19   Ct Angio Chest Pe W And/or Wo Contrast  Result Date: 05/11/2016 CLINICAL DATA:  Cough, shortness of breath. Recent saddle pulmonary embolus. Now with LEFT pulmonary infiltrate, assess for infarct. History of breast cancer, diabetes, CHF. EXAM: CT  ANGIOGRAPHY CHEST WITH CONTRAST TECHNIQUE: Multidetector CT imaging of the chest was performed using the standard protocol during bolus administration of intravenous contrast. Multiplanar CT image reconstructions and MIPs were obtained to evaluate the vascular anatomy. CONTRAST:  80 cc Isovue 370. COMPARISON:  CT chest April 24, 2016. FINDINGS: CARDIOVASCULAR: Adequate contrast opacification of the pulmonary artery's. Main  pulmonary artery is not enlarged. Residual pulmonary emboli LEFT lobar pulmonary artery's, predominately central in distribution and occlusive an LEFT lower lobe. Dissolution of the saddle embolic component. Residual nonocclusive embolus RIGHT middle lobar artery. RIGHT lower lobe pulmonary artery filling defect is now peripheral compatible with early chronicity, propagating to the segmental artery's without occlusion. Heart size is mildly enlarged, RV/ LV equals 0.9, previously 1.2. No pericardial effusions. Thoracic aorta is normal course and caliber, trace calcific atherosclerosis. MEDIASTINUM/NODES: No lymphadenopathy by CT size criteria. LUNGS/PLEURA: Tracheobronchial tree is patent, no pneumothorax. Bilateral lower lobe and lingular enhancing atelectasis without wedge-like consolidation to suggest infarct. No pleural effusion. UPPER ABDOMEN: Included view of the abdomen is unremarkable. MUSCULOSKELETAL: Visualized soft tissues and included osseous structures are nonacute. Broad dextroscoliosis. Mild old T3 and T8 for compression fractures. Review of the MIP images confirms the above findings. IMPRESSION: No new pulmonary emboli. Dissolving, decreased clot burden from prior CT. No pulmonary infarcts. Bibasilar atelectasis. Mild cardiomegaly. Borderline, improved RIGHT heart strain (RV/LV 0.9, previously 1.2). Electronically Signed   By: Elon Alas M.D.   On: 05/11/2016 23:07    EKG: Independently reviewed. Rate 104, QTc 460, no ST changes.  Echocardiogram Jan 2017:    Impressions:   - Normal LV size and systolic function, EF 38-75%. Normal RV size   and systolic function. No significant valvular abnormalities.   Negative bubble study.         Assessment/Plan  1. Hypoxia and chest pain in setting of resolving saddle pulmonary embolus:  Suspect this is WNL for expected with resolving PE.  She is not taking any prescribed or OTC analgesics at home.  She may actually need some temporary home O2.   ACS is doubted, CT shows diminished PE burden, no evidence of pneumonia, pleural effusion, or infarction by CT.  -Schedule acetaminophen -Tramadol PRN (patient is sensitive to this, she says) -Ambulatory desat test tomorrow and CM consult for home O2 if needed -Incentive spirometry -Monitor fever curve -Repeat troponin -Continue apixaban  Note: to pharmacy she endorsed taking apixaban once daily, should be reinforced to her daughter that this is a BID medication   2. Elevated troponin:  Suspect this is resolving strain from PE, echo during last hospitalization was reassuring. -Repeat to confirm flat trend  3. Diabetes:  Her PCP has taken her off glipizide -Continue metformin  -SSI low dose  4. GERD:  -Continue PPI and H2RA        DVT prophylaxis: N/A on apixaban  Code Status: FULL  Family Communication: None present  Disposition Plan: Anticipate Pain control and ambu desat test and if pain controlled, home after Consults called: None Admission status: OBS At the point of initial evaluation, it is my clinical opinion that admission for OBSERVATION is reasonable and necessary because the patient's presenting complaints in the context of their chronic conditions represent sufficient risk of deterioration or significant morbidity to constitute reasonable grounds for close observation in the hospital setting, but that the patient may be medically stable for discharge from the hospital within 24 to 48 hours.    Medical decision making:  Patient seen at 2:40 AM on 05/12/2016.  The patient was discussed with Dr. Thomasene Lot.  What exists of the patient's chart was reviewed in depth and summarized above.  Clinical condition: stable.        Edwin Dada [facility] Pager [phone]

## 2016-05-12 NOTE — Progress Notes (Signed)
  Echocardiogram 2D Echocardiogram has been performed.  Tresa Res 05/12/2016, 3:15 PM

## 2016-05-12 NOTE — Progress Notes (Signed)
Pt with intermittent chest pain, espicial with coughing or when she yawns.   Pt refused pain med. MD aware of it chest pain.  Also pt heart rate up in 140 with transferring from bed to bedside commode.  Down in 130's when sitting down. BP 123/79, HR 103.  Will continue to monitor. Will notify Dr. Broadus John.   Karie Kirks, RN

## 2016-05-12 NOTE — ED Notes (Signed)
Patient is stable and ready to be transport to the floor at this time.  Report was called to 3E RN.  Belongings taken with the patient to the floor.   

## 2016-05-12 NOTE — Care Management Note (Addendum)
Case Management Note  Patient Details  Name: Carrie Watts MRN: [id number] Date of Birth: 11/15/1950  Subjective/Objective:      Admitted to Observation for Hypoxia             Patient was recently discharged 05/03/2016; Subjective/Objective:                 Patient visiting daughter from [place], [place]. Originally anticipated to stay about 3 weeks per daughter Hiram Gash, however patient became sick, admitted with saddle PE. Patient currently on Lovenox. Patient does not have insurance or PCP locally. Is under treatment in [place] for breast CA per daughter. Patient will recover with daughter prior to returning to [place]. Daughter not sure at this time how long patient will stay. D Swisr RN CM  Will need [place] letter, to cover Lovenox etc.  Will need apt [facility] or [facility].   Addendum: Spoke to patient's daughter Guy Begin. She states that patient can get Lovenox at a pharmacy in [place], that she will be able to afford it. Through their healthcare system, patient's medications are discounted and affordable. She could not provide a pharmacy's phone number to call to verify this. She states that she herself as a nurse will be able to administer shots at home and teach patient. She states that the patient will be able to administer Lovenox to herself as well as have family to assist with administration in [place]. Spoke with daughter about [facility] resources being self pay, declined. She had questions about home med list, activity parameters at DC and test results. CM paged Dr Clementeen Graham to call daughter. Spoke with [employer] [place] pharmacist, she stated she will enter patient and cost override in [facility] system to approve 30 days of Lovenox. CM will print out card and map and phone number to fill Lovenox at OP pharmacy if patient would like to use [facility] program.  04-29-16 APt made to est PCP with [facility] on 05-23-16 at 2pm. Per Dr Mellody Dance ([facility]) patient will DC on DOAC for 30 days (will be covered by coupon,  MD not sure of which DOAC, so will need coupon at DC) and then transition to coumadin (managed by [facility]). Patient to have desat test to eval for O2 requirement prior to discharge. Spoke with daughter, she is not sure how long she is going to keep mom with her, may be up to 3 months. Referred to CSW for assistance for medicaid coverage, or information on temporary/ emergency medicaid. 1/23 Per Dr Candiss Norse patient will DC with Eliquis. CM notified Outpatient pharmacy so they would not order month supply of Lovenox, CM cancelled [facility] apt. Transition liaison Opal Sidles B at [facility] able to schedule appt on 1/25. CM notified daughter Guy Begin of apt change and verified she still had Eliquis card. Left brochure with appointment time on it at bedside. Guy Begin stated she was not aware of DC today, nor the change in plan with anticoags. Requested to speak to Dr Candiss Norse, CM text paged Renetta Chalk number to Dr Candiss Norse with request for him to call her. D Swist RN CM  05/12/2016- CM will continue to follow for DCP; since patient used the [employer] recently, she does not qualify for medication assistance; CM talked to patient, she requested that I talk to her daughter Guy Begin; awaiting callback; Aneta Mins   Expected Discharge Date:    possibly 05/13/2016               Expected Discharge Plan:  Home/Self Care  Discharge  planning Services  CM Consult  Status of Service:  In process, will continue to follow  Royston Bake, RN 05/12/2016, 10:55 AM

## 2016-05-12 NOTE — Progress Notes (Signed)
Schorr, NP notified of troponin 0.10 Via text. No nnew orders received.

## 2016-05-12 NOTE — Progress Notes (Signed)
Received consult- pt may need need 02; CM will continue to follow for possible home 02 and need qualifying 02 saturation prior to discharge before oxygen can be ordered; B [employer] [phone]

## 2016-05-12 NOTE — Progress Notes (Signed)
Pt's daughter Gregary Signs requesting if MD can order breast medicine anatrozole for breast CA and PNA and flu vaccine.  MD returned call and instructed she will call pt's daughter in 30 minutes.   Karie Kirks, Therapist, sports.

## 2016-05-12 NOTE — Progress Notes (Signed)
Pt seen and examined, admitted earlier this am by Dr.Danford 65/F with h/o Breast Ca undergoing Rx in [place], admitted 1/14 through 1/20 with Acute Saddle PE, also had Neuro symptoms and was ruled out for CVA with MRI. She was treated with subcutaneous Lovenox in the hospital and subsequently discharged home on oral  Eliquis. She was able to get her Eliquis filled Since discharge she reports continuing to have sharp chest pain with inspiration and coughing and being unable to take deep breaths and some intermittent headaches and came back to the ER overnight and admitted this am. CTA overnight with resolving clot burden, atelectasis in LLL without clear wedge-shaped opacities to suggest infarct,  I suspect expected evolution of symptoms only, unfortunately remains tachycardic HR in 120-130s, will start low dose BB. Troponin is 0.10-due to demand ischemia, which is lower than from last week of 0.25. Monitor on tele today, add Metoprolol, repeat ECHO Ambulatory O2 sats -discharge home when symptom burden improved, very symptomatic with dyspnea and chest pain at this time.  Domenic Polite, MD

## 2016-05-13 ENCOUNTER — Encounter: Payer: Self-pay | Admitting: Radiation Oncology

## 2016-05-13 ENCOUNTER — Telehealth: Payer: Self-pay | Admitting: Oncology

## 2016-05-13 DIAGNOSIS — E1165 Type 2 diabetes mellitus with hyperglycemia: Secondary | ICD-10-CM | POA: Diagnosis not present

## 2016-05-13 DIAGNOSIS — R748 Abnormal levels of other serum enzymes: Secondary | ICD-10-CM | POA: Diagnosis not present

## 2016-05-13 DIAGNOSIS — R071 Chest pain on breathing: Secondary | ICD-10-CM | POA: Diagnosis not present

## 2016-05-13 DIAGNOSIS — R0902 Hypoxemia: Secondary | ICD-10-CM | POA: Diagnosis not present

## 2016-05-13 DIAGNOSIS — R079 Chest pain, unspecified: Secondary | ICD-10-CM | POA: Diagnosis not present

## 2016-05-13 DIAGNOSIS — R778 Other specified abnormalities of plasma proteins: Secondary | ICD-10-CM

## 2016-05-13 DIAGNOSIS — I429 Cardiomyopathy, unspecified: Secondary | ICD-10-CM | POA: Diagnosis not present

## 2016-05-13 DIAGNOSIS — I2602 Saddle embolus of pulmonary artery with acute cor pulmonale: Secondary | ICD-10-CM | POA: Diagnosis not present

## 2016-05-13 DIAGNOSIS — R931 Abnormal findings on diagnostic imaging of heart and coronary circulation: Secondary | ICD-10-CM | POA: Diagnosis not present

## 2016-05-13 DIAGNOSIS — I5023 Acute on chronic systolic (congestive) heart failure: Secondary | ICD-10-CM | POA: Diagnosis not present

## 2016-05-13 DIAGNOSIS — E119 Type 2 diabetes mellitus without complications: Secondary | ICD-10-CM | POA: Diagnosis not present

## 2016-05-13 DIAGNOSIS — R7989 Other specified abnormal findings of blood chemistry: Secondary | ICD-10-CM

## 2016-05-13 DIAGNOSIS — I248 Other forms of acute ischemic heart disease: Secondary | ICD-10-CM | POA: Diagnosis not present

## 2016-05-13 DIAGNOSIS — D649 Anemia, unspecified: Secondary | ICD-10-CM | POA: Diagnosis not present

## 2016-05-13 DIAGNOSIS — J9601 Acute respiratory failure with hypoxia: Secondary | ICD-10-CM | POA: Diagnosis not present

## 2016-05-13 LAB — RETICULOCYTES
RBC.: 3.09 MIL/uL — AB (ref 3.87–5.11)
RETIC COUNT ABSOLUTE: 148.3 10*3/uL (ref 19.0–186.0)
Retic Ct Pct: 4.8 % — ABNORMAL HIGH (ref 0.4–3.1)

## 2016-05-13 LAB — IRON AND TIBC
Iron: 22 ug/dL — ABNORMAL LOW (ref 28–170)
Saturation Ratios: 12 % (ref 10.4–31.8)
TIBC: 185 ug/dL — ABNORMAL LOW (ref 250–450)
UIBC: 163 ug/dL

## 2016-05-13 LAB — CBC
HEMATOCRIT: 26.8 % — AB (ref 36.0–46.0)
HEMOGLOBIN: 8.6 g/dL — AB (ref 12.0–15.0)
MCH: 30.8 pg (ref 26.0–34.0)
MCHC: 32.1 g/dL (ref 30.0–36.0)
MCV: 96.1 fL (ref 78.0–100.0)
Platelets: 314 10*3/uL (ref 150–400)
RBC: 2.79 MIL/uL — ABNORMAL LOW (ref 3.87–5.11)
RDW: 17.4 % — ABNORMAL HIGH (ref 11.5–15.5)
WBC: 8.5 10*3/uL (ref 4.0–10.5)

## 2016-05-13 LAB — GLUCOSE, CAPILLARY
GLUCOSE-CAPILLARY: 106 mg/dL — AB (ref 65–99)
GLUCOSE-CAPILLARY: 145 mg/dL — AB (ref 65–99)
GLUCOSE-CAPILLARY: 151 mg/dL — AB (ref 65–99)
GLUCOSE-CAPILLARY: 155 mg/dL — AB (ref 65–99)

## 2016-05-13 LAB — TROPONIN I
TROPONIN I: 0.09 ng/mL — AB (ref <0.03)
TROPONIN I: 0.1 ng/mL — AB (ref <0.03)
Troponin I: 0.09 ng/mL (ref <0.03)

## 2016-05-13 LAB — BASIC METABOLIC PANEL
Anion gap: 12 (ref 5–15)
BUN: 10 mg/dL (ref 6–20)
CHLORIDE: 102 mmol/L (ref 101–111)
CO2: 21 mmol/L — AB (ref 22–32)
Calcium: 9.1 mg/dL (ref 8.9–10.3)
Creatinine, Ser: 0.86 mg/dL (ref 0.44–1.00)
GFR calc Af Amer: >60 mL/min (ref >60)
GFR calc non Af Amer: >60 mL/min (ref >60)
GLUCOSE: 92 mg/dL (ref 65–99)
POTASSIUM: 4.1 mmol/L (ref 3.5–5.1)
Sodium: 135 mmol/L (ref 135–145)

## 2016-05-13 LAB — FERRITIN: FERRITIN: 770 ng/mL — AB (ref 11–307)

## 2016-05-13 LAB — FOLATE: Folate: 21.3 ng/mL (ref >5.9)

## 2016-05-13 LAB — VITAMIN B12: Vitamin B-12: 478 pg/mL (ref 180–914)

## 2016-05-13 MED ORDER — PANTOPRAZOLE SODIUM 40 MG PO TBEC
40.0000 mg | DELAYED_RELEASE_TABLET | Freq: Two times a day (BID) | ORAL | Status: DC
  Administered 2016-05-13 – 2016-05-22 (×18): 40 mg via ORAL
  Filled 2016-05-13 (×18): qty 1

## 2016-05-13 MED ORDER — HYDROCODONE-HOMATROPINE 5-1.5 MG/5ML PO SYRP
5.0000 mL | ORAL_SOLUTION | ORAL | Status: DC | PRN
  Filled 2016-05-13: qty 5

## 2016-05-13 MED ORDER — HEPARIN (PORCINE) IN NACL 100-0.45 UNIT/ML-% IJ SOLN
600.0000 [IU]/h | INTRAMUSCULAR | Status: AC
  Administered 2016-05-13: 1000 [IU]/h via INTRAVENOUS
  Administered 2016-05-15: 750 [IU]/h via INTRAVENOUS
  Filled 2016-05-13 (×3): qty 250

## 2016-05-13 MED FILL — TRUEplus LANCETS 30G MISC: 25 days supply | Qty: 100 | Fill #0 | Status: TO

## 2016-05-13 MED FILL — TRUE METRIX GLUCOSE TEST ST: 13 days supply | Qty: 50 | Fill #0 | Status: TO

## 2016-05-13 MED FILL — ANASTROZOLE 1 MG TABLET: 1 | 90 days supply | Qty: 90 | Fill #0 | Status: TO

## 2016-05-13 NOTE — Progress Notes (Signed)
PROGRESS NOTE    Carrie Watts  [id number] DOB: Feb 09, 1951 DOA: 05/11/2016 PCP: No PCP Per Patient    Brief Narrative:   Carrie Watts is a 66 y.o. female with a past medical history significant for breast cancer, recent saddle PE and DVT, and new diabetes who presents with chest pain.  The patient developed chest pain and stroke-like symptoms about two weeks before she flew to the [place] to visit her daughter at the beginning of January.  She was admitted here on 1/14 and diagnosed with new saddle PE with right heart strain and started on Lovenox, transitioned to apixaban at discharge.  Since discharge, she has been taking no OTC analgesics, and has not been able to obtain the prescription analgesic she was prescribed.  She has obtained her apixaban and is taking it as prescribed (although she told pharmacy tech she had just transitioned to *once* daily dosing).  Since discharge, she has had several flares of severe left sided chest pain, sharp and severe, worse with coughing or deep breathing and associated with feeling she can't catch her breath.  She also endorses some left sided headache, but no fever, chills, sputum, hemoptysis.  On day of admission, she went to Dr. Virgie Dad office, where a CXR appeared to show left lower infiltrate and so she was referred to the ER. Patient due to her dyspnea and pleuritic chest very PCT angiogram was done that showed decreased clot burden. 2-D echo obtained did show a depressed EF with diffuse hypokinesis. Cardiology consulted.    Assessment & Plan:   Principal Problem:   Hypoxia Active Problems:   Type 2 diabetes mellitus without complication, without long-term current use of insulin (HCC)   Acute saddle pulmonary embolism with acute cor pulmonale (HCC)   Anemia   Abnormal echocardiogram   Elevated troponin  #1 hypoxia/dyspnea/chest pain Likely multifactorial mole likely secondary to recent acute subtle pulmonary emboli and probable depressed  EF with diffuse hypokinesis/cardiomyopathy per 2-D echo of 05/12/2016. Patient states still dyspneic on minimal exertion and occasional pleuritic chest pain. Repeat CT angiogram of chest which was done showing a reduced clot burden. 2-D echo with a EF of 40-45% which is reduced from 55-60% from 2 d ECHO 04/25/2015. Recent echo of 05/12/2016 with a diffuse hypokinesis and a grade 1 diastolic dysfunction. Patient has been transitioned from apixaban to IV heparin per cardiology in anticipation of probable cardiac catheterization on Monday, 05/16/2016.  #2 recent acute saddle PE Patient still complains of dyspnea on minimal exertion with some pleuritic chest pain. CT angiogram of chest done on admission with no new pulmonary emboli, decreased clot burden from prior CT. No pulmonary infarcts. Bibasilar atelectasis. Patient was on a picture been prior to admission and has been transitioned to IV heparin per cardiology in anticipation of probable cardiac catheterization on Monday, 05/16/2016.  #3 anemia Questionable etiology. Patient with no overt bleeding. Anemia panel consistent with anemia of chronic disease. Iron level at 22. FOBT pending. Cardiology requested GI evaluation prior to cardiac catheterization. Will consult GI for further evaluation and management.  #4 type 2 diabetes Hemoglobin A1c 12.9 on 04/25/2016. CBGs ranging from 106-151. Patient's PCP took patient off glipizide. Patient on metformin at home. Hold oral hypoglycemic agents during this hospitalization.Continue sliding scale insulin.  #5 tachycardia Likely secondary to problem #1 and 2.  Improved on metoprolol. Follow.  #6 gastroesophageal reflux disease/Hiatial hernia Increase PPI to twice a day. Continue Pepcid.  #7 history of breast cancer Continue Arimidex. Outpatient follow-up.  #  8 elevated troponin/abnormal 2-D echo Likely secondary to recent acute PE. Troponin level seem to have plateaued. 2-D echo obtained with a EF of  40-45% with diffuse hypokinesis. Patient has been seen in consultation by cardiology and patient for probable cardiac catheterization on Monday, 05/16/2016.    DVT prophylaxis: IV heparin. Code Status: Full Family Communication: Updated patient and daughter at bedside. Disposition Plan: Home once symptom burden has improved and patient less dyspneic with improved chest pain and post cardiac catheterization and per cardiology.   Consultants:   Cardiology: Dr. Meda Coffee 05/13/2016  Procedures:   CT angiogram chest 05/11/2016  Chest x-ray 05/11/2016  2-D echo 05/12/2016    Antimicrobials:   Azithromycin 05/13/2016>>>>> 05/17/2016   Subjective: Patient c/o pleuritic chest pain. Still with some SOB. Patient denies any bloody bowel movements. Patient also with complaints of heartburn. Per daughter patient with hiatal hernia.  Objective: Vitals:   05/12/16 2005 05/13/16 0535 05/13/16 1015 05/13/16 1631  BP: 114/84 111/67  101/73  Pulse: (!) 112 88  96  Resp: 18 18  18   Temp: 99.6 F (37.6 C) 98.6 F (37 C)  98.8 F (37.1 C)  TempSrc: Oral Oral  Oral  SpO2: 100% 99% 94% 99%  Weight:  82.4 kg (181 lb 9.6 oz)    Height:        Intake/Output Summary (Last 24 hours) at 05/13/16 1654 Last data filed at 05/13/16 1315  Gross per 24 hour  Intake              420 ml  Output              400 ml  Net               20 ml   Filed Weights   05/12/16 0156 05/13/16 0535  Weight: 83.4 kg (183 lb 14.4 oz) 82.4 kg (181 lb 9.6 oz)    Examination:  General exam: Appears calm and comfortable  Respiratory system: Clear to auscultation. Respiratory effort normal. Cardiovascular system: S1 & S2 heard, RRR. No JVD, murmurs, rubs, gallops or clicks. No pedal edema. Gastrointestinal system: Abdomen is nondistended, soft and nontender. No organomegaly or masses felt. Normal bowel sounds heard. Central nervous system: Alert and oriented. No focal neurological deficits. Extremities:  Symmetric 5 x 5 power. Skin: No rashes, lesions or ulcers Psychiatry: Judgement and insight appear normal. Mood & affect appropriate.     Data Reviewed: I have personally reviewed following labs and imaging studies  CBC:  Recent Labs Lab 05/09/16 1518 05/11/16 1507 05/11/16 1832 05/13/16 0428  WBC 7.8 7.7 8.2 8.5  NEUTROABS 5,382 5.6  --   --   HGB 9.6* 9.3* 9.7* 8.6*  HCT 30.8* 29.4* 30.1* 26.8*  MCV 100.0 96.7 96.2 96.1  PLT 375 313 344 [id number]   Basic Metabolic Panel:  Recent Labs Lab 05/11/16 1507 05/11/16 1832 05/13/16 0428  NA 137 136 135  K 4.7 4.4 4.1  CL  --  102 102  CO2 22 21* 21*  GLUCOSE 114 129* 92  BUN 11.6 10 10   CREATININE 0.9 0.95 0.86  CALCIUM 10.2 9.9 9.1   GFR: Estimated Creatinine Clearance: 63.1 mL/min (by C-G formula based on SCr of 0.86 mg/dL). Liver Function Tests:  Recent Labs Lab 05/11/16 1507  AST 32  ALT 24  ALKPHOS 101  BILITOT 0.50  PROT 7.4  ALBUMIN 2.8*   No results for input(s): LIPASE, AMYLASE in the last 168 hours. No results for input(s): AMMONIA  in the last 168 hours. Coagulation Profile:  Recent Labs Lab 05/11/16 2028  INR 1.44   Cardiac Enzymes:  Recent Labs Lab 05/12/16 0454 05/13/16 0952 05/13/16 1349  TROPONINI 0.10* 0.09* 0.09*   BNP (last 3 results) No results for input(s): PROBNP in the last 8760 hours. HbA1C: No results for input(s): HGBA1C in the last 72 hours. CBG:  Recent Labs Lab 05/12/16 1627 05/12/16 2116 05/13/16 0732 05/13/16 1145 05/13/16 1634  GLUCAP 153* 138* 106* 145* 151*   Lipid Profile: No results for input(s): CHOL, HDL, LDLCALC, TRIG, CHOLHDL, LDLDIRECT in the last 72 hours. Thyroid Function Tests: No results for input(s): TSH, T4TOTAL, FREET4, T3FREE, THYROIDAB in the last 72 hours. Anemia Panel:  Recent Labs  05/11/16 1507 05/13/16 1349  VITAMINB12  --  478  FOLATE  --  21.3  FERRITIN 979* 770*  TIBC  --  185*  IRON  --  22*  RETICCTPCT 8.10* 4.8*    Sepsis Labs: No results for input(s): PROCALCITON, LATICACIDVEN in the last 168 hours.  No results found for this or any previous visit (from the past 240 hour(s)).       Radiology Studies: Dg Chest 2 View  Result Date: 05/11/2016 CLINICAL DATA:  Left-sided chest pain extending to the mandible and shoulder. Cough. Elevated troponin. EXAM: CHEST  2 VIEW COMPARISON:  05/11/2016 at 14:11 FINDINGS: Persistent left base opacity is unchanged and could represent pneumonia or evolving pulmonary infarction. Right lung is clear. Pulmonary vasculature is normal. Hilar and mediastinal contours are unremarkable and unchanged. Mild cardiomegaly, unchanged. IMPRESSION: Left base opacity, with considerations including pneumonia, pulmonary infarction, aspiration. Normal pulmonary vasculature. Electronically Signed   By: Andreas Newport M.D.   On: 05/11/2016 19:49   Ct Angio Chest Pe W And/or Wo Contrast  Result Date: 05/11/2016 CLINICAL DATA:  Cough, shortness of breath. Recent saddle pulmonary embolus. Now with LEFT pulmonary infiltrate, assess for infarct. History of breast cancer, diabetes, CHF. EXAM: CT ANGIOGRAPHY CHEST WITH CONTRAST TECHNIQUE: Multidetector CT imaging of the chest was performed using the standard protocol during bolus administration of intravenous contrast. Multiplanar CT image reconstructions and MIPs were obtained to evaluate the vascular anatomy. CONTRAST:  80 cc Isovue 370. COMPARISON:  CT chest April 24, 2016. FINDINGS: CARDIOVASCULAR: Adequate contrast opacification of the pulmonary artery's. Main pulmonary artery is not enlarged. Residual pulmonary emboli LEFT lobar pulmonary artery's, predominately central in distribution and occlusive an LEFT lower lobe. Dissolution of the saddle embolic component. Residual nonocclusive embolus RIGHT middle lobar artery. RIGHT lower lobe pulmonary artery filling defect is now peripheral compatible with early chronicity, propagating to the  segmental artery's without occlusion. Heart size is mildly enlarged, RV/ LV equals 0.9, previously 1.2. No pericardial effusions. Thoracic aorta is normal course and caliber, trace calcific atherosclerosis. MEDIASTINUM/NODES: No lymphadenopathy by CT size criteria. LUNGS/PLEURA: Tracheobronchial tree is patent, no pneumothorax. Bilateral lower lobe and lingular enhancing atelectasis without wedge-like consolidation to suggest infarct. No pleural effusion. UPPER ABDOMEN: Included view of the abdomen is unremarkable. MUSCULOSKELETAL: Visualized soft tissues and included osseous structures are nonacute. Broad dextroscoliosis. Mild old T3 and T8 for compression fractures. Review of the MIP images confirms the above findings. IMPRESSION: No new pulmonary emboli. Dissolving, decreased clot burden from prior CT. No pulmonary infarcts. Bibasilar atelectasis. Mild cardiomegaly. Borderline, improved RIGHT heart strain (RV/LV 0.9, previously 1.2). Electronically Signed   By: Elon Alas M.D.   On: 05/11/2016 23:07        Scheduled Meds: . anastrozole  1  mg Oral Daily  . atorvastatin  20 mg Oral QHS  . azithromycin  250 mg Oral Daily  . famotidine  10 mg Oral Daily  . insulin aspart  0-5 Units Subcutaneous QHS  . insulin aspart  0-9 Units Subcutaneous TID WC  . metoprolol tartrate  25 mg Oral BID  . pantoprazole  40 mg Oral BID AC   Continuous Infusions: . heparin       LOS: 1 day    Time spent: 17 mins    Itzabella Sorrels, MD [facility] Pager [phone]  If 7PM-7AM, please contact night-coverage [email or web address] Password [facility] 05/13/2016, 4:54 PM

## 2016-05-13 NOTE — Progress Notes (Signed)
   Family seeking notary services not provided by the Spiritual Care department.  - Rev. Crump MDiv ThM

## 2016-05-13 NOTE — Progress Notes (Signed)
ANTICOAGULATION CONSULT NOTE - Initial Consult  Pharmacy Consult for Apixaban --> Heparin Indication: hx DVT/PE/Afib  Allergies  Allergen Reactions  . Dust Mite Extract     Patient Measurements: Height: 5\' 2"  (157.5 cm) Weight: 181 lb 9.6 oz (82.4 kg) (scale a) IBW/kg (Calculated) : 50.1 Heparin Dosing Weight: 68.5kg  Vital Signs: Temp: 98.6 F (37 C) (02/02 0535) Temp Source: Oral (02/02 0535) BP: 111/67 (02/02 0535) Pulse Rate: 88 (02/02 0535)  Labs:  Recent Labs  05/11/16 1507 05/11/16 1507 05/11/16 1832 05/11/16 2028 05/12/16 0454 05/13/16 0428 05/13/16 0952  HGB 9.3*  --  9.7*  --   --  8.6*  --   HCT 29.4*  --  30.1*  --   --  26.8*  --   PLT 313  --  344  --   --  314  --   LABPROT  --   --   --  17.7*  --   --   --   INR  --   --   --  1.44  --   --   --   CREATININE  --  0.9 0.95  --   --  0.86  --   TROPONINI  --   --   --   --  0.10*  --  0.09*    Estimated Creatinine Clearance: 63.1 mL/min (by C-G formula based on SCr of 0.86 mg/dL).   Medical History: Past Medical History:  Diagnosis Date  . Angina of effort ([place])   . Atrial fibrillation ([place])   . Cancer (HCC)    breast  . CHF (congestive heart failure) ([place])   . Deep venous thrombosis ([place])   . Diabetes mellitus without complication ([place])   . Hypercholesterolemia   . Hypertension   . Pulmonary embolism ([place])   . Renal disorder   . Stroke [facility])    Assessment: 65yof on apixaban for hx DVT/PE/afib now with ? GI bleeding. Pharmacy asked to stop apixaban and start heparin pending GI evaluation. Cardiology also planning to do a cath (? Monday) to evaluate new low EF. Last apixaban dose 1013 today. Hgb low 8.6. Will use aPTTs to initially monitor heparin as apixaban falsely elevates heparin levels.  Goal of Therapy:  APTT 66-102 seconds Heparin level 0.3-0.7 units/ml Monitor platelets by anticoagulation protocol: Yes   Plan:  1) At 2200 tonight, begin heparin at 1000 units/hr 2) Check 6  hour aPTT/heparin level 3) Daily aPTT, heparin level, CBC  Deboraha Sprang 05/13/2016,12:03 PM

## 2016-05-13 NOTE — Progress Notes (Signed)
Patient's oxygen saturation 93-94% while ambulating on room air.  Patient walked halfway to the nurses station from her room.

## 2016-05-13 NOTE — Consult Note (Signed)
CARDIOLOGY CONSULT NOTE   Patient ID: Carrie Watts MRN: [id number] DOB/AGE: 06/02/1950 66 y.o.  Admit date: 05/11/2016  Primary Physician   No PCP Per Patient Primary Cardiologist   New to Dr. Meda Coffee Reason for Consultation   Decreased EF Requesting Physician  Dr. Grandville Silos  HPI: Carrie Watts is a 66 y.o. female with a history of DM, L breast cancer s/p lumpectomy and 5 rounds of chemo in [place] and recent admission for PE and DVT at [facility] (04/24/16-05/03/16) presenting with sob.   She flew back from [place] 1st or 2nd week of Jan 2018. Came to ER 1/14 with R sided facial droop and difficulty speaking and sob. Workup done in the ED showed a saddle pulmonary emboli. LE venous doppler showed right LE DVT. Her PE could be related to long flight travel vs. Hypercoagulable state secondary to breast cancer. Ruled out for stroke, MRI/MRA brain unremarkable, echo with bubble study stable, carotid duplex nonacute. Started on  on Lovenox, transitioned to apixaban at discharge.  Seen oncology clinic 05/11/16. Patient continued to have dyspnea since discharge with intermittent chest pain. That worsen for the past 2 days. Later developed L sided epigastric pain leading to further evaluation at [facility]. She gets L epigastric chest pain with deep breath that radiates down to L lower abdomen and then up. Like a circle.   Repeat CTA showed resolving clot burden. Mild cardiomegaly. Borderline, improved RIGHT heart strain (RV/LV 0.9, previously 1.2). Repeat echo yesterday showed Mild to moderate global reduction in LV systolic function; grade 1 diastolic dysfunction; mild LVH; mild MR; trace TR with mildly elevated pulmonary pressure. Normal RV function. Her EF was 55-60% 04/24/16. Cardiology asked for further evaluation of new low EF. EKG NSR with out acute changes. Troponin flat trend.   Denies prior hx of CAD or CHF. States that she had angina with exertion but denies cath or stress test. Mother died of CHF in her  68s.    CT of abdomen 05/05/16 as outpatient. Done due to spotting on the toilet paper IMPRESSION: No acute finding by CT. No lesions seen to explain gastrointestinal bleeding. The patient does have diverticulosis which can be associated with bleeding. No convincing diverticulitis. Low level diverticulitis can be inapparent at imaging.  Question early cirrhosis.   Her Hgb is stable.    Past Medical History:  Diagnosis Date  . Angina of effort ([place])   . Atrial fibrillation ([place])   . Cancer (HCC)    breast  . CHF (congestive heart failure) ([place])   . Deep venous thrombosis ([place])   . Diabetes mellitus without complication ([place])   . Hypercholesterolemia   . Hypertension   . Pulmonary embolism ([place])   . Renal disorder   . Stroke [facility])      Past Surgical History:  Procedure Laterality Date  . BREAST SURGERY    . PARTIAL HYSTERECTOMY      Allergies  Allergen Reactions  . Dust Mite Extract     I have reviewed the patient's current medications . anastrozole  1 mg Oral Daily  . apixaban  5 mg Oral BID  . atorvastatin  20 mg Oral QHS  . azithromycin  250 mg Oral Daily  . famotidine  10 mg Oral Daily  . insulin aspart  0-5 Units Subcutaneous QHS  . insulin aspart  0-9 Units Subcutaneous TID WC  . metoprolol tartrate  25 mg Oral BID  . pantoprazole  40 mg Oral Daily    HYDROcodone-acetaminophen, morphine injection,  traMADol  Prior to Admission medications   Medication Sig Start Date End Date Taking? Authorizing Provider  apixaban (ELIQUIS) 5 MG TABS tablet Take 1 tablet (5 mg total) by mouth 2 (two) times daily. Patient taking differently: Take 5 mg by mouth daily.  05/11/16  Yes Thurnell Lose, MD  atorvastatin (LIPITOR) 20 MG tablet Take 1 tablet (20 mg total) by mouth at bedtime. Can switch to a cheaper statin if needed 05/03/16  Yes Thurnell Lose, MD  folic acid (FOLVITE) 726 MCG tablet Take 800 mcg by mouth daily.   Yes Historical Provider, MD  metFORMIN  (GLUCOPHAGE) 1000 MG tablet Take 1 tablet (1,000 mg total) by mouth 2 (two) times daily with a meal. 05/03/16  Yes Thurnell Lose, MD  Multiple Vitamin (MULTIVITAMIN WITH MINERALS) TABS tablet Take 1 tablet by mouth daily.   Yes Historical Provider, MD  omeprazole (PRILOSEC) 20 MG capsule Take 20 mg by mouth daily.   Yes Historical Provider, MD  ranitidine (ZANTAC) 300 MG tablet Take 300 mg by mouth at bedtime.   Yes Historical Provider, MD  apixaban (ELIQUIS) 5 MG TABS tablet Take 2 tablets (10 mg total) by mouth 2 (two) times daily. 05/03/16 05/10/16  Thurnell Lose, MD  glucose monitoring kit (FREESTYLE) monitoring kit 1 each by Does not apply route 4 (four) times daily - after meals and at bedtime. 1 month Diabetic Testing Supplies for QAC-QHS accuchecks.  Diagnosis E11.65. Any brand OK 05/03/16   Thurnell Lose, MD  traMADol (ULTRAM) 50 MG tablet Take 1 tablet (50 mg total) by mouth every 6 (six) hours as needed. Patient not taking: Reported on 05/11/2016 05/11/16   Chauncey Cruel, MD   . anastrozole  1 mg Oral Daily  . apixaban  5 mg Oral BID  . atorvastatin  20 mg Oral QHS  . azithromycin  250 mg Oral Daily  . famotidine  10 mg Oral Daily  . insulin aspart  0-5 Units Subcutaneous QHS  . insulin aspart  0-9 Units Subcutaneous TID WC  . metoprolol tartrate  25 mg Oral BID  . pantoprazole  40 mg Oral Daily    Social History   Social History  . Marital status: Divorced    Spouse name: N/A  . Number of children: N/A  . Years of education: N/A   Occupational History  . Not on file.   Social History Main Topics  . Smoking status: Never Smoker  . Smokeless tobacco: Never Used  . Alcohol use No  . Drug use: No  . Sexual activity: Not on file   Other Topics Concern  . Not on file   Social History Narrative  . No narrative on file    Family Status  Relation Status  . Mother   . Neg Hx    Family History  Problem Relation Age of Onset  . Heart attack Mother   .  Clotting disorder Neg Hx       ROS:  Full 14 point review of systems complete and found to be negative unless listed above.  Physical Exam: Blood pressure 111/67, pulse 88, temperature 98.6 F (37 C), temperature source Oral, resp. rate 18, height 5' 2" (1.575 m), weight 181 lb 9.6 oz (82.4 kg), SpO2 94 %.  General:ill appearing female in no acute distress Head: Eyes PERRLA, No xanthomas. Normocephalic and atraumatic, oropharynx without edema or exudate.  Lungs: Resp regular and unlabored, CTA. Heart: RRR no s3, s4, or murmurs.[name]  Neck: No carotid bruits. No lymphadenopathy. No JVD. Abdomen: Bowel sounds present, abdomen soft and non-tender without masses or hernias noted. Msk:  No spine or cva tenderness. No weakness, no joint deformities or effusions. Extremities: No clubbing, cyanosis. Trace BL LE  edema. DP/PT/Radials 2+ and equal bilaterally. Neuro: Alert and oriented X 3. No focal deficits noted. Psych:  Good affect, responds appropriately Skin: No rashes or lesions noted.  Labs:   Lab Results  Component Value Date   WBC 8.5 05/13/2016   HGB 8.6 (L) 05/13/2016   HCT 26.8 (L) 05/13/2016   MCV 96.1 05/13/2016   PLT 314 05/13/2016    Recent Labs  05/11/16 2028  INR 1.44    Recent Labs Lab 05/11/16 1507  05/13/16 0428  NA 137  < > 135  K 4.7  < > 4.1  CL  --   < > 102  CO2 22  < > 21*  BUN 11.6  < > 10  CREATININE 0.9  < > 0.86  CALCIUM 10.2  < > 9.1  PROT 7.4  --   --   BILITOT 0.50  --   --   ALKPHOS 101  --   --   ALT 24  --   --   AST 32  --   --   GLUCOSE 114  < > 92  ALBUMIN 2.8*  --   --   < > = values in this interval not displayed. No results found for: MG  Recent Labs  05/12/16 0454 05/13/16 0952  TROPONINI 0.10* 0.09*    Recent Labs  05/11/16 1842  TROPIPOC 0.13*   No results found for: PROBNP Lab Results  Component Value Date   CHOL 196 04/25/2016   HDL 38 (L) 04/25/2016   LDLCALC 127 (H) 04/25/2016   TRIG 154 (H) 04/25/2016    No results found for: DDIMER No results found for: LIPASE, AMYLASE No results found for: TSH, T4TOTAL, T3FREE, THYROIDAB Vitamin B-12  Date/Time Value Ref Range Status  05/09/2016 03:18 PM 455 200 - 1,100 pg/mL Final   Folate  Date/Time Value Ref Range Status  05/09/2016 03:18 PM >24.0 >5.4 ng/mL Final    Comment:    Reference Range >17 years:   Low: <3.4 ng/mL              Borderline: 3.4-5.4 ng/mL              Normal: >5.4 ng/mL      Ferritin  Date/Time Value Ref Range Status  05/11/2016 03:07 PM 979 (H) 9 - 269 ng/ml Final   Retic %  Date/Time Value Ref Range Status  05/11/2016 03:07 PM 8.10 (H) 0.70 - 2.10 % Final    Echo: 05/12/16 LV EF: 40% -   45%  ------------------------------------------------------------------- Indications:      Dyspnea 786.09.  ------------------------------------------------------------------- History:   PMH:   Congestive heart failure.  Risk factors: Hypertension. Diabetes mellitus. Obese.  ------------------------------------------------------------------- Study Conclusions  - Left ventricle: The cavity size was normal. Wall thickness was   increased in a pattern of mild LVH. Systolic function was mildly   to moderately reduced. The estimated ejection fraction was in the   range of 40% to 45%. Diffuse hypokinesis. Doppler parameters are   consistent with abnormal left ventricular relaxation (grade 1   diastolic dysfunction). - Mitral valve: There was mild regurgitation. - Pulmonary arteries: Systolic pressure was mildly increased. PA   peak pressure: 40 mm Hg (S).  Impressions:  -  Mild to moderate global reduction in LV systolic function; grade   1 diastolic dysfunction; mild LVH; mild MR; trace TR with mildly   elevated pulmonary pressure.  Echo 04/14/16 LV EF: 55% -   60%  ------------------------------------------------------------------- Indications:      CVA  436.  ------------------------------------------------------------------- History:   PMH:   Congestive heart failure.  Risk factors: Diabetes mellitus.  ------------------------------------------------------------------- Study Conclusions  - Left ventricle: The cavity size was normal. Wall thickness was   normal. Systolic function was normal. The estimated ejection   fraction was in the range of 55% to 60%. Wall motion was normal;   there were no regional wall motion abnormalities. Doppler   parameters are consistent with abnormal left ventricular   relaxation (grade 1 diastolic dysfunction). - Aortic valve: There was no stenosis. - Mitral valve: There was trivial regurgitation. - Right ventricle: The cavity size was normal. Systolic function   was normal. - Atrial septum: No defect or patent foramen ovale was identified.   Echo contrast study showed no right-to-left atrial level shunt,   at baseline or with provocation. - Tricuspid valve: Peak RV-RA gradient (S): 35 mm Hg. - Pulmonary arteries: PA peak pressure: 38 mm Hg (S). - Inferior vena cava: The vessel was normal in size. The   respirophasic diameter changes were in the normal range (>= 50%),   consistent with normal central venous pressure.  Impressions:  - Normal LV size and systolic function, EF 40-98%. Normal RV size   and systolic function. No significant valvular abnormalities.   Negative bubble study.  Radiology:  Dg Chest 2 View  Result Date: 05/11/2016 CLINICAL DATA:  Left-sided chest pain extending to the mandible and shoulder. Cough. Elevated troponin. EXAM: CHEST  2 VIEW COMPARISON:  05/11/2016 at 14:11 FINDINGS: Persistent left base opacity is unchanged and could represent pneumonia or evolving pulmonary infarction. Right lung is clear. Pulmonary vasculature is normal. Hilar and mediastinal contours are unremarkable and unchanged. Mild cardiomegaly, unchanged. IMPRESSION: Left base opacity, with  considerations including pneumonia, pulmonary infarction, aspiration. Normal pulmonary vasculature. Electronically Signed   By: Andreas Newport M.D.   On: 05/11/2016 19:49   Dg Chest 2 View  Result Date: 05/11/2016 CLINICAL DATA:  Acute onset of shortness of breath and mid chest pain. Initial encounter. EXAM: CHEST  2 VIEW COMPARISON:  Chest radiograph and CTA of the chest performed 04/24/2016 FINDINGS: The lungs are well-aerated. Mild left basilar airspace opacity raises question for pneumonia. There is no evidence of pleural effusion or pneumothorax. The heart is borderline normal in size. No acute osseous abnormalities are seen. IMPRESSION: Mild left basilar airspace opacity raises question for pneumonia. Residual pulmonary infarct may have a similar appearance, given recent saddle pulmonary embolus. Electronically Signed   By: Garald Balding M.D.   On: 05/11/2016 16:19   Ct Angio Chest Pe W And/or Wo Contrast  Result Date: 05/11/2016 CLINICAL DATA:  Cough, shortness of breath. Recent saddle pulmonary embolus. Now with LEFT pulmonary infiltrate, assess for infarct. History of breast cancer, diabetes, CHF. EXAM: CT ANGIOGRAPHY CHEST WITH CONTRAST TECHNIQUE: Multidetector CT imaging of the chest was performed using the standard protocol during bolus administration of intravenous contrast. Multiplanar CT image reconstructions and MIPs were obtained to evaluate the vascular anatomy. CONTRAST:  80 cc Isovue 370. COMPARISON:  CT chest April 24, 2016. FINDINGS: CARDIOVASCULAR: Adequate contrast opacification of the pulmonary artery's. Main pulmonary artery is not enlarged. Residual pulmonary emboli LEFT lobar pulmonary artery's, predominately central in distribution and occlusive  an LEFT lower lobe. Dissolution of the saddle embolic component. Residual nonocclusive embolus RIGHT middle lobar artery. RIGHT lower lobe pulmonary artery filling defect is now peripheral compatible with early chronicity,  propagating to the segmental artery's without occlusion. Heart size is mildly enlarged, RV/ LV equals 0.9, previously 1.2. No pericardial effusions. Thoracic aorta is normal course and caliber, trace calcific atherosclerosis. MEDIASTINUM/NODES: No lymphadenopathy by CT size criteria. LUNGS/PLEURA: Tracheobronchial tree is patent, no pneumothorax. Bilateral lower lobe and lingular enhancing atelectasis without wedge-like consolidation to suggest infarct. No pleural effusion. UPPER ABDOMEN: Included view of the abdomen is unremarkable. MUSCULOSKELETAL: Visualized soft tissues and included osseous structures are nonacute. Broad dextroscoliosis. Mild old T3 and T8 for compression fractures. Review of the MIP images confirms the above findings. IMPRESSION: No new pulmonary emboli. Dissolving, decreased clot burden from prior CT. No pulmonary infarcts. Bibasilar atelectasis. Mild cardiomegaly. Borderline, improved RIGHT heart strain (RV/LV 0.9, previously 1.2). Electronically Signed   By: Elon Alas M.D.   On: 05/11/2016 23:07    ASSESSMENT AND PLAN:     1. Reduced LV function - Echo this admission 05/12/16 showed 40-45% which reduced from 55-60% from 04/24/16. No RV dysfunction on recent echo however R sided heart strain on CTA of chest. Unknown etiology of low EF. Hx of angina but no cath or stress test in past. She does have epigastric pain. Noted blood on toilet paper on Eliquis. CT of abdomen ?diverticulosis which can be associated with bleeding.She is very uncomfortable with breathing. Hgb stable.   Stop Eliquis. Start heparin. Consult GI to r/o any bleeding. IF Ok with GI. Plan for cath on Monday for evaluation of new low EF.   2. PE and DVT - Reduced clot burdon on most recent CTA  Signed: Bhagat,Bhavinkumar, PA 05/13/2016, 11:21 AM Pager [phone]  Co-Sign MD   The patient was seen, examined and discussed with Bhagat,Bhavinkumar PA-C and I agree with the above.    A very pleasant female  with no prior cardiac history but h/o DM, L breast cancer s/p lumpectomy and 5 rounds of chemo in [place] and recent admission for PE and DVT at [facility] (04/24/16-05/03/16) presenting with sob and abdominal pain. She flew back from [place] 1st or 2nd week of Jan 2018. Came to ER 1/14 with R sided facial droop and difficulty speaking and sob. Workup done in the ED showed a saddle pulmonary emboli. LE venous doppler showed right LE DVT. Her PE could be related to long flight travel vs. Hypercoagulable state secondary to breast cancer. Ruled out for stroke, MRI/MRA brain unremarkable, echo with bubble study stable, carotid duplex nonacute. Started on  on Lovenox, transitioned to apixaban at discharge. Seen oncology clinic 05/11/16. Patient continued to have dyspnea since discharge with intermittent chest pain. That worsen for the past 2 days. Later developed LUQ and mid epigastric pain leading to further evaluation at [facility]. Her pain is excruciating, the worst with cough, no fever or chills, cough with some blood tinged sputum but otherwise nonproductive.  Troponin 0.10-> 0.09, interestingly LVEF 55-60% with no regional wall motion abnormalities, now LVEF 40-45% with diffuse hypokinesis, normal RVEF, no significant pericardial effusion.  We would recommend to start GI workup, call GI consult for workup, abdominal/pelvic CT on 05/05/16 showed  diverticulosis which can be associated with bleeding.  If workup negative we will plan for a left heart catheterization on Monday. Switch Eliquis to Heparin drip for now.  Ena Dawley, MD 05/13/2016

## 2016-05-13 NOTE — Telephone Encounter (Signed)
lvm for desk nurse to change mammogram order and add bone density order to sch appts for pt at [facility] center 06/2016 per LOS

## 2016-05-13 NOTE — Consult Note (Signed)
[facility] Consult: 4:04 PM 05/13/2016  LOS: 1 day    Referring Provider: Dr Grandville Silos  Primary Care Physician:  Patient has yet to establish a primary care provider. She just relocated from the [place] to [place] a couple of weeks ago. Primary Gastroenterologist: unassigned   Reason for Consultation:  Normocytic anemia.     HPI: Carrie Watts is a 66 y.o. female.  PMH of estrogen receptor positive left breast cancer. Status post left mastectomy 07/2015. Began the chemotherapy in 01/2016 with doxorubicin and cyclophosphamide, started on paclitaxel in 03/2016.  DM type 2.   Patient has never undergone upper endoscopy or colonoscopy. She takes omeprazole for history of GERD.    Patient diagnosed with DVT/PE during admission 04/24/2016 through 05/03/2016.  She presented to the emergency room with dyspnea and chest pain and CT confirmed saddle embolus.  Patient was transitioned from Lovenox to [place].  Reviewing labs it is noted she was anemic with a Hgb nadir of 7.6. Hgb 8.3 at discharge.  Iron studies sugg of anemia of chronic disease, MCV normal.  She did not receive blood transfusions. Troponins were also elevated but was not seen by cardiology.   During that admission she received a new diagnosis of DM type 2 and was started on Glucophage/Glucotrol.  CT and MRI of the head were performed because of a history of facial droop this showed moderate atrophy and chronic microvascular ischemic change, possibly from diabetes or from chemotherapy. There were scattered areas of lacunar infarction.  Patient had outpatient CT scan of the abdomen on 05/05/16 due to a history of small amount of blood spotting on toilet paper. This shows diverticulosis, question of early cirrhosis as there was slight lobularity to the  liver.  Patient readmitted 05/12/2016 with left sided lower chest and upper abdominal quadrant pain. This is triggered by breathing. She was feeling short of breath. A chest x-ray after 05/11/16 visit to Dr. Jana Hakim showed left base opacity possibly pneumonia versus pulmonary infarct versus aspiration. Showed pneumonia versus pulmonary infarction versus aspiration.  Hemoglobin measures 8.6 with MCV of 96.  Following her mastectomy last year, there was some sort of bleeding complication, sounds like maybe a vascular injury, and this led to anemia and red blood cell transfusion but since then she hasn't needed blood transfusion, or oral iron.  She also says that she received shots to boost her white blood cell count in the past, so possibly Neupogen.  A few days after starting Abixiban, the patient noticed that when she cleared her throat she would see some clots of blood but she never saw any nasal bleeding and never had any vomiting of blood.  This stopped after 2 or 3 days. Patient rarely sees any blood per rectum, when she does it's because she's been wiping a lot or had a stool that was difficult to pass. Normally she has a bowel movement daily. Yesterday she had 3 or 4 soft brown stools. There is no nausea.  Appetite is somewhat depressed. She describes occasional dysphagia wherein when she swallows she  feels like things are getting stuck at the level of the upper esophageal sphincter.  Home meds include Prilosec daily along with nightly Ranitidine. In the past she has taken aspirin but none recently. Does not use NSAIDs.  Current weight of 181# reflects 8 pound drop within the last 3 days. It is unclear whether these were all standing weights or some of these were bed scale weights.  Family hx with ? Colon cancer in her dad in his mid 46s, had cancer and they did not determine exact source.    Patient's troponins are still minimally elevated and she was seen by Dr. Meda Coffee of cardiology earlier  today. Repeat CT angiogram shows resolving pulmonary clot burden.  He has has gone from 55-60% down to 40-45% in the course of 2 weeks. There is a plan for cardiac catheterization on Monday. Eliquis has been discontinued and IV heparin will be initiated.    Past Medical History:  Diagnosis Date  . Angina of effort ([place])   . Atrial fibrillation ([place])   . Cancer (HCC)    breast  . CHF (congestive heart failure) ([facility])   . Deep venous thrombosis ([place])   . Diabetes mellitus without complication ([place])   . Hypercholesterolemia   . Hypertension   . Pulmonary embolism ([facility])   . Renal disorder   . Stroke [facility])     Past Surgical History:  Procedure Laterality Date  . BREAST SURGERY    . PARTIAL HYSTERECTOMY      Prior to Admission medications   Medication Sig Start Date End Date Taking? Authorizing Provider  apixaban (ELIQUIS) 5 MG TABS tablet Take 1 tablet (5 mg total) by mouth 2 (two) times daily. Patient taking differently: Take 5 mg by mouth daily.  05/11/16  Yes Thurnell Lose, MD  atorvastatin (LIPITOR) 20 MG tablet Take 1 tablet (20 mg total) by mouth at bedtime. Can switch to a cheaper statin if needed 05/03/16  Yes Thurnell Lose, MD  folic acid (FOLVITE) 329 MCG tablet Take 800 mcg by mouth daily.   Yes Historical Provider, MD  metFORMIN (GLUCOPHAGE) 1000 MG tablet Take 1 tablet (1,000 mg total) by mouth 2 (two) times daily with a meal. 05/03/16  Yes Thurnell Lose, MD  Multiple Vitamin (MULTIVITAMIN WITH MINERALS) TABS tablet Take 1 tablet by mouth daily.   Yes Historical Provider, MD  omeprazole (PRILOSEC) 20 MG capsule Take 20 mg by mouth daily.   Yes Historical Provider, MD  ranitidine (ZANTAC) 300 MG tablet Take 300 mg by mouth at bedtime.   Yes Historical Provider, MD  apixaban (ELIQUIS) 5 MG TABS tablet Take 2 tablets (10 mg total) by mouth 2 (two) times daily. 05/03/16 05/10/16  Thurnell Lose, MD  glucose monitoring kit (FREESTYLE) monitoring kit 1 each by Does not  apply route 4 (four) times daily - after meals and at bedtime. 1 month Diabetic Testing Supplies for QAC-QHS accuchecks.  Diagnosis E11.65. Any brand OK 05/03/16   Thurnell Lose, MD  traMADol (ULTRAM) 50 MG tablet Take 1 tablet (50 mg total) by mouth every 6 (six) hours as needed. Patient not taking: Reported on 05/11/2016 05/11/16   Chauncey Cruel, MD    Scheduled Meds: . anastrozole  1 mg Oral Daily  . atorvastatin  20 mg Oral QHS  . azithromycin  250 mg Oral Daily  . famotidine  10 mg Oral Daily  . insulin aspart  0-5 Units Subcutaneous QHS  . insulin aspart  0-9  Units Subcutaneous TID WC  . metoprolol tartrate  25 mg Oral BID  . pantoprazole  40 mg Oral BID AC   Infusions: . heparin     PRN Meds: HYDROcodone-acetaminophen, HYDROcodone-homatropine, morphine injection, traMADol   Allergies as of 05/11/2016 - Review Complete 05/11/2016  Allergen Reaction Noted  . Dust mite extract  04/24/2016    Family History  Problem Relation Age of Onset  . Heart attack Mother   . Clotting disorder Neg Hx     Social History   Social History  . Marital status: Divorced    Spouse name: N/A  . Number of children: N/A  . Years of education: N/A   Occupational History  . Not on file.   Social History Main Topics  . Smoking status: Never Smoker  . Smokeless tobacco: Never Used  . Alcohol use No  . Drug use: No  . Sexual activity: Not on file   Other Topics Concern  . Not on file   Social History Narrative  . No narrative on file    REVIEW OF SYSTEMS: Constitutional:  Some fatigue and weakness. ENT:  No nose bleeds Pulm:  Per HPI CV:  No palpitations, no LE edema.  GU:  No hematuria, no frequency GI:  Per HPI Heme:  No excessive bruising or unusual bleeding.   Transfusions:  Per HPI Neuro:  No headaches, no peripheral tingling or numbness Derm:  No itching, no rash or sores.  Endocrine:  No sweats or chills.  No polyuria or dysuria. Immunization:   did not  inquire as to recent or previous immunizations Travel:  None beyond local counties in last few months.    PHYSICAL EXAM: Vital signs in last 24 hours: Vitals:   05/12/16 2005 05/13/16 0535  BP: 114/84 111/67  Pulse: (!) 112 88  Resp: 18 18  Temp: 99.6 F (37.6 C) 98.6 F (37 C)   Wt Readings from Last 3 Encounters:  05/13/16 82.4 kg (181 lb 9.6 oz)  05/11/16 84.6 kg (186 lb 8 oz)  05/09/16 86 kg (189 lb 9.6 oz)    General: Very sweet and pleasant.  On scribble until she takes a deep breath and then she has the left breast/thoracic pain. Head:  alopecia  Eyes:   no scleral icterus, no conjunctival pallor Ears:   somewhat hard of hearing.  Nose:   no discharge or congestion Mouth:   moist, clear oral mucosa. No lesions, no exudates. No blood Neck:   no JVD, no masses, no thyromegaly. Lungs:   fine rales in the bases worse on the left. Patient experiencing discomfort in the left chest/upper quadrant when she takes in a deeper breath. No cough. No dyspnea Breast:  Well-healed surgical deformities on left chest postmastectomy Heart:  RRR. No MRG. S1, S2 present Abdomen:   soft. Not tender or distended. No hepatosplenomegaly, no masses, no hernias. Bowel sounds active..   Rectal:   Digital exam with smooth mucosa. No stool and no blood. Small external hemorrhoids.   Musc/[place] joint swelling, redness or gross deformity Extremities:  no CCE  Neurologic:  patient somewhat hard of hearing, alert, oriented times 3. Speech is slow but precise. Moves all 4 limbs. No tremor no gross weakness or deficits. Skin:   no rashes, no sores, no lesions. Alopecia. Tattoos:   none visualized Nodes:   no cervical adenopathy.   Psych:   pleasant, calm. Relaxed, not depressed.  Intake/Output from previous day: 02/01 0701 - 02/02 0700 In: 600 [  P.O.:600] Out: 900 [Urine:900] Intake/Output this shift: Total I/O In: 420 [P.O.:420] Out: -   LAB RESULTS:  Recent Labs  05/11/16 1507  05/11/16 1832 05/13/16 0428  WBC 7.7 8.2 8.5  HGB 9.3* 9.7* 8.6*  HCT 29.4* 30.1* 26.8*  PLT 313 344 314   BMET Lab Results  Component Value Date   NA 135 05/13/2016   NA 136 05/11/2016   NA 137 05/11/2016   K 4.1 05/13/2016   K 4.4 05/11/2016   K 4.7 05/11/2016   CL 102 05/13/2016   CL 102 05/11/2016   CL 104 05/05/2016   CO2 21 (L) 05/13/2016   CO2 21 (L) 05/11/2016   CO2 22 05/11/2016   GLUCOSE 92 05/13/2016   GLUCOSE 129 (H) 05/11/2016   GLUCOSE 114 05/11/2016   BUN 10 05/13/2016   BUN 10 05/11/2016   BUN 11.6 05/11/2016   CREATININE 0.86 05/13/2016   CREATININE 0.95 05/11/2016   CREATININE 0.9 05/11/2016   CALCIUM 9.1 05/13/2016   CALCIUM 9.9 05/11/2016   CALCIUM 10.2 05/11/2016   LFT  Recent Labs  05/11/16 1507  PROT 7.4  ALBUMIN 2.8*  AST 32  ALT 24  ALKPHOS 101  BILITOT 0.50   PT/INR Lab Results  Component Value Date   INR 1.44 05/11/2016   INR 1.33 05/02/2016   INR 1.24 05/01/2016   Hepatitis Panel No results for input(s): HEPBSAG, HCVAB, HEPAIGM, HEPBIGM in the last 72 hours. C-Diff No components found for: CDIFF Lipase  No results found for: LIPASE  Drugs of Abuse  No results found for: LABOPIA, COCAINSCRNUR, LABBENZ, AMPHETMU, THCU, LABBARB   RADIOLOGY STUDIES: Dg Chest 2 View  Result Date: 05/11/2016 CLINICAL DATA:  Left-sided chest pain extending to the mandible and shoulder. Cough. Elevated troponin. EXAM: CHEST  2 VIEW COMPARISON:  05/11/2016 at 14:11 FINDINGS: Persistent left base opacity is unchanged and could represent pneumonia or evolving pulmonary infarction. Right lung is clear. Pulmonary vasculature is normal. Hilar and mediastinal contours are unremarkable and unchanged. Mild cardiomegaly, unchanged. IMPRESSION: Left base opacity, with considerations including pneumonia, pulmonary infarction, aspiration. Normal pulmonary vasculature. Electronically Signed   By: Andreas Newport M.D.   On: 05/11/2016 19:49   Ct Angio Chest  Pe W And/or Wo Contrast  Result Date: 05/11/2016 CLINICAL DATA:  Cough, shortness of breath. Recent saddle pulmonary embolus. Now with LEFT pulmonary infiltrate, assess for infarct. History of breast cancer, diabetes, CHF. EXAM: CT ANGIOGRAPHY CHEST WITH CONTRAST TECHNIQUE: Multidetector CT imaging of the chest was performed using the standard protocol during bolus administration of intravenous contrast. Multiplanar CT image reconstructions and MIPs were obtained to evaluate the vascular anatomy. CONTRAST:  80 cc Isovue 370. COMPARISON:  CT chest April 24, 2016. FINDINGS: CARDIOVASCULAR: Adequate contrast opacification of the pulmonary artery's. Main pulmonary artery is not enlarged. Residual pulmonary emboli LEFT lobar pulmonary artery's, predominately central in distribution and occlusive an LEFT lower lobe. Dissolution of the saddle embolic component. Residual nonocclusive embolus RIGHT middle lobar artery. RIGHT lower lobe pulmonary artery filling defect is now peripheral compatible with early chronicity, propagating to the segmental artery's without occlusion. Heart size is mildly enlarged, RV/ LV equals 0.9, previously 1.2. No pericardial effusions. Thoracic aorta is normal course and caliber, trace calcific atherosclerosis. MEDIASTINUM/NODES: No lymphadenopathy by CT size criteria. LUNGS/PLEURA: Tracheobronchial tree is patent, no pneumothorax. Bilateral lower lobe and lingular enhancing atelectasis without wedge-like consolidation to suggest infarct. No pleural effusion. UPPER ABDOMEN: Included view of the abdomen is unremarkable. MUSCULOSKELETAL: Visualized soft tissues  and included osseous structures are nonacute. Broad dextroscoliosis. Mild old T3 and T8 for compression fractures. Review of the MIP images confirms the above findings. IMPRESSION: No new pulmonary emboli. Dissolving, decreased clot burden from prior CT. No pulmonary infarcts. Bibasilar atelectasis. Mild cardiomegaly. Borderline,  improved RIGHT heart strain (RV/LV 0.9, previously 1.2). Electronically Signed   By: Elon Alas M.D.   On: 05/11/2016 23:07     IMPRESSION:   *  Normocytic anemia, looks like an anemia of chronic disease.  *  Diverticulosis per CT scan. History of minor bleeding per rectum.  By rectal exam has small hemorrhoids and these could be cause of minor BPR.   No previous colonoscopy   *  GERD by hx.  Takes PPI as well as H2 blocker. No previous EGD.  *  Recent diagnosis of saddle pulmonary embolus and DVT, started on Eliquis 2 weeks ago.  This is currently on hold, to be replaced by IV heparin to allow for impending cardiac catheterization. CTA shows resolving clot burden.  *  History of breast cancer, status post left mastectomy April 2017. Initiated on chemotherapy 01/2016 and then paclitaxel 03/2016.  Just established with Dr Jana Hakim on 05/11/16  *  ? Cirrhosis, raised by CT.    *  Reduced LVEV of after over 2 weeks time frame.  Plan for cardiac cath 05/16/16.   PLAN:     *  Await stool FOB test.    *  No urgent plans for colon/EGD but at her age needs colonoscopy screening and with hx of GERD, deserves EGD as well.     Azucena Freed  05/13/2016, 4:04 PM Pager: [phone]     Attending physician's note   I have taken a history, examined the patient and reviewed the chart. I agree with the Advanced Practitioner's note, impression and recommendations. Normocytic anemia with studies c/w chronic disease. No overt GI bleeding noted and no stool for Hemoccult today. Eliquis for 2 weeks for a new saddle PE and DVT. If stool is heme positive need to pursue colon +/- EGD however if heme negative no urgent need for endoscopic procedures and would recommend elective procedures in a few months given recent saddle PE and DVT.   Lucio Edward, MD [facility] [phone] Mon-Fri 8a-5p [phone] after 5p, weekends, holidays

## 2016-05-14 DIAGNOSIS — R079 Chest pain, unspecified: Secondary | ICD-10-CM | POA: Diagnosis not present

## 2016-05-14 DIAGNOSIS — I5023 Acute on chronic systolic (congestive) heart failure: Secondary | ICD-10-CM | POA: Diagnosis not present

## 2016-05-14 DIAGNOSIS — I248 Other forms of acute ischemic heart disease: Secondary | ICD-10-CM | POA: Diagnosis not present

## 2016-05-14 DIAGNOSIS — R932 Abnormal findings on diagnostic imaging of liver and biliary tract: Secondary | ICD-10-CM | POA: Diagnosis not present

## 2016-05-14 DIAGNOSIS — C50912 Malignant neoplasm of unspecified site of left female breast: Secondary | ICD-10-CM | POA: Diagnosis not present

## 2016-05-14 DIAGNOSIS — I429 Cardiomyopathy, unspecified: Secondary | ICD-10-CM | POA: Diagnosis not present

## 2016-05-14 DIAGNOSIS — R931 Abnormal findings on diagnostic imaging of heart and coronary circulation: Secondary | ICD-10-CM | POA: Diagnosis not present

## 2016-05-14 DIAGNOSIS — I2602 Saddle embolus of pulmonary artery with acute cor pulmonale: Principal | ICD-10-CM

## 2016-05-14 DIAGNOSIS — E118 Type 2 diabetes mellitus with unspecified complications: Secondary | ICD-10-CM

## 2016-05-14 DIAGNOSIS — E1165 Type 2 diabetes mellitus with hyperglycemia: Secondary | ICD-10-CM | POA: Diagnosis not present

## 2016-05-14 DIAGNOSIS — R0902 Hypoxemia: Secondary | ICD-10-CM | POA: Diagnosis not present

## 2016-05-14 DIAGNOSIS — R748 Abnormal levels of other serum enzymes: Secondary | ICD-10-CM

## 2016-05-14 DIAGNOSIS — R195 Other fecal abnormalities: Secondary | ICD-10-CM | POA: Diagnosis not present

## 2016-05-14 DIAGNOSIS — D649 Anemia, unspecified: Secondary | ICD-10-CM | POA: Diagnosis not present

## 2016-05-14 DIAGNOSIS — I272 Pulmonary hypertension, unspecified: Secondary | ICD-10-CM

## 2016-05-14 DIAGNOSIS — I5021 Acute systolic (congestive) heart failure: Secondary | ICD-10-CM | POA: Diagnosis not present

## 2016-05-14 DIAGNOSIS — J9601 Acute respiratory failure with hypoxia: Secondary | ICD-10-CM | POA: Diagnosis not present

## 2016-05-14 DIAGNOSIS — C779 Secondary and unspecified malignant neoplasm of lymph node, unspecified: Secondary | ICD-10-CM

## 2016-05-14 LAB — GLUCOSE, CAPILLARY
GLUCOSE-CAPILLARY: 133 mg/dL — AB (ref 65–99)
Glucose-Capillary: 168 mg/dL — ABNORMAL HIGH (ref 65–99)
Glucose-Capillary: 123 mg/dL — ABNORMAL HIGH (ref 65–99)
Glucose-Capillary: 161 mg/dL — ABNORMAL HIGH (ref 65–99)

## 2016-05-14 LAB — CBC
HCT: 25.7 % — ABNORMAL LOW (ref 36.0–46.0)
HEMOGLOBIN: 8.2 g/dL — AB (ref 12.0–15.0)
MCH: 30.6 pg (ref 26.0–34.0)
MCHC: 31.9 g/dL (ref 30.0–36.0)
MCV: 95.9 fL (ref 78.0–100.0)
Platelets: 327 10*3/uL (ref 150–400)
RBC: 2.68 MIL/uL — ABNORMAL LOW (ref 3.87–5.11)
RDW: 17.5 % — ABNORMAL HIGH (ref 11.5–15.5)
WBC: 7.7 10*3/uL (ref 4.0–10.5)

## 2016-05-14 LAB — HEPARIN LEVEL (UNFRACTIONATED)
Heparin Unfractionated: 1.82 IU/mL — ABNORMAL HIGH (ref 0.30–0.70)
Heparin Unfractionated: 1.11 IU/mL — ABNORMAL HIGH (ref 0.30–0.70)

## 2016-05-14 LAB — BASIC METABOLIC PANEL
ANION GAP: 9 (ref 5–15)
BUN: 10 mg/dL (ref 6–20)
CO2: 22 mmol/L (ref 22–32)
CREATININE: 0.87 mg/dL (ref 0.44–1.00)
Calcium: 9 mg/dL (ref 8.9–10.3)
Chloride: 105 mmol/L (ref 101–111)
GFR calc non Af Amer: >60 mL/min (ref >60)
Glucose, Bld: 158 mg/dL — ABNORMAL HIGH (ref 65–99)
Potassium: 4 mmol/L (ref 3.5–5.1)
Sodium: 136 mmol/L (ref 135–145)

## 2016-05-14 LAB — APTT
APTT: 125 s — AB (ref 24–36)
APTT: 102 s — AB (ref 24–36)
aPTT: 90 seconds — ABNORMAL HIGH (ref 24–36)

## 2016-05-14 LAB — OCCULT BLOOD X 1 CARD TO LAB, STOOL: Fecal Occult Bld: POSITIVE — AB

## 2016-05-14 MED ORDER — PNEUMOCOCCAL VAC POLYVALENT 25 MCG/0.5ML IJ INJ
0.5000 mL | INJECTION | INTRAMUSCULAR | Status: AC
  Administered 2016-05-15: 0.5 mL via INTRAMUSCULAR
  Filled 2016-05-14: qty 0.5

## 2016-05-14 MED ORDER — INFLUENZA VAC SPLIT QUAD 0.5 ML IM SUSY
0.5000 mL | PREFILLED_SYRINGE | INTRAMUSCULAR | Status: AC
  Administered 2016-05-15: 0.5 mL via INTRAMUSCULAR
  Filled 2016-05-14: qty 0.5

## 2016-05-14 MED ORDER — INSULIN ASPART 100 UNIT/ML ~~LOC~~ SOLN
0.0000 [IU] | Freq: Three times a day (TID) | SUBCUTANEOUS | Status: DC
  Administered 2016-05-14: 1 [IU] via SUBCUTANEOUS
  Administered 2016-05-14: 2 [IU] via SUBCUTANEOUS
  Administered 2016-05-14: 1 [IU] via SUBCUTANEOUS
  Administered 2016-05-15: 5 [IU] via SUBCUTANEOUS
  Administered 2016-05-15: 1 [IU] via SUBCUTANEOUS
  Administered 2016-05-15: 2 [IU] via SUBCUTANEOUS
  Administered 2016-05-16: 1 [IU] via SUBCUTANEOUS
  Administered 2016-05-17: 3 [IU] via SUBCUTANEOUS
  Administered 2016-05-17 – 2016-05-18 (×4): 1 [IU] via SUBCUTANEOUS
  Administered 2016-05-19: 2 [IU] via SUBCUTANEOUS
  Administered 2016-05-19: 3 [IU] via SUBCUTANEOUS
  Administered 2016-05-19 – 2016-05-20 (×2): 1 [IU] via SUBCUTANEOUS
  Administered 2016-05-20 (×2): 3 [IU] via SUBCUTANEOUS
  Administered 2016-05-21: 5 [IU] via SUBCUTANEOUS
  Administered 2016-05-21: 2 [IU] via SUBCUTANEOUS
  Administered 2016-05-21: 5 [IU] via SUBCUTANEOUS
  Administered 2016-05-22: 2 [IU] via SUBCUTANEOUS
  Administered 2016-05-22: 5 [IU] via SUBCUTANEOUS

## 2016-05-14 MED ORDER — CARVEDILOL 12.5 MG PO TABS
12.5000 mg | ORAL_TABLET | Freq: Two times a day (BID) | ORAL | Status: DC
  Administered 2016-05-14: 12.5 mg via ORAL
  Filled 2016-05-14: qty 1

## 2016-05-14 NOTE — Progress Notes (Signed)
PROGRESS NOTE    Carrie Watts  [id number] DOB: 1950/09/15 DOA: 05/11/2016 PCP: No PCP Per Patient     Brief Narrative:  66 y.o.BF PMHx CVA,  Atrial fibrillation ,Grade 2, mixed infiltrating lobular and ductal, involving multiple quadrants Left Breast cancer S/P left mastectomy and axillary lymph node dissection at [facility] in [place] , [place] chemotherapy), Saddle PE and DVT (Dx 1/14), new DM Type 2, HLD, Renal disorder  Who presents with chest pain.  The patient developed chest pain and stroke-like symptoms about two weeks before she flew to the [place] to visit her daughter at the beginning of January. She was admitted here on 1/14 and diagnosed with new saddle PE with right heart strain and started on Lovenox, transitioned to apixaban at discharge.  Since discharge, she has been taking no OTC analgesics, and has not been able to obtain the prescription analgesic she was prescribed. She has obtained her apixaban and is taking it as prescribed (although she told pharmacy tech she had just transitioned to *once* daily dosing). Since discharge, she has had several flares of severe left sided chest pain, sharp and severe, worse with coughing or deep breathing and associated with feeling she can't catch her breath. She also endorses some left sided headache, but no fever, chills, sputum, hemoptysis. On day of admission, she went to Dr. Virgie Dad office, where a CXR appeared to show left lower infiltrate and so she was referred to the ER. Patient due to her dyspnea and pleuritic chest very PCT angiogram was done that showed decreased clot burden. 2-D echo obtained did show a depressed EF with diffuse hypokinesis. Cardiology consulted.    Subjective: 2/3 A/O 4, states her SOB/CP has significantly improved. Able to take deep breaths now.     Assessment & Plan:   Principal Problem:   Hypoxia Active Problems:   Type 2 diabetes mellitus without complication, without  long-term current use of insulin (HCC)   Acute saddle pulmonary embolism with acute cor pulmonale (HCC)   Anemia   Abnormal echocardiogram   Elevated troponin   hypoxia/dyspnea/chest pain Likely multifactorial mole likely secondary to recent acute subtle pulmonary emboli and probable depressed EF with diffuse hypokinesis/cardiomyopathy per 2-D echo of 05/12/2016. Patient states still dyspneic on minimal exertion and occasional pleuritic chest pain. Repeat CT angiogram of chest which was done showing a reduced clot burden. 2-D echo with a EF of 40-45% which is reduced from 55-60% from 2 d ECHO 04/25/2015. Recent echo of 05/12/2016 with a diffuse hypokinesis and a grade 1 diastolic dysfunction. Patient has been transitioned from apixaban to IV heparin per cardiology in anticipation of probable cardiac catheterization on Monday, 05/16/2016.  Acute saddle PE (Dx 1/14) Patient still complains of dyspnea on minimal exertion with some pleuritic chest pain. CT angiogram of chest done on admission with no new pulmonary emboli, decreased clot burden from prior CT. No pulmonary infarcts. Bibasilar atelectasis.  - IV heparin per cardiology in anticipation of probable cardiac catheterization on Monday, 05/16/2016.  Anemia Questionable etiology. Patient with no overt bleeding. Anemia panel consistent with anemia of chronic disease. Iron level at 22. FOBT pending. Cardiology requested GI evaluation prior to cardiac catheterization. Will consult GI for further evaluation and management.  DM type 2 uncontrolled with complication  -0000000 Hemoglobin A1c =12.9  -Hold oral hypoglycemic agents during this hospitalization. -Sensitive SSI   Acute systolic CHF -Cardiac catheterization planned for 2/5  Pulmonary hypertension -See acute see systolic CHF   GERD/Hiatial hernia -PPI  to twice a day. Continue Pepcid.  Grade 2, mixed infiltrating lobular and ductal, involving multiple quadrants Left Breast cancer    Continue Arimidex. Outpatient follow-up.  Elevated troponin/abnormal 2-D echo Likely secondary to recent acute PE. Troponin level seem to have plateaued. 2-D echo obtained with a EF of 40-45% with diffuse hypokinesis. Patient has been seen in consultation by cardiology and patient for probable cardiac catheterization on Monday, 05/16/2016.     DVT prophylaxis: Heparin drip Code Status: Full Family Communication: None Disposition Plan:    Consultants:  Cardiology    Procedures/Significant Events:  1/14 CT angiogram chest W contrast (previous admission):Positive PE ncluding saddle embolus and bilateral lobar and segmentale.- Moderately large clot burden. Positive for acute CT evidence of right heart strain (RV/LV Ratio = 1.19)  -Patchy nodular ground-glass infiltrative changes in the lungs may be due to multifocal pneumonia, edema, or infarcts. -Metastatic disease would be a less likely consideration but due to history of primary carcinoma, non-contrast chest CT at 3-6 months is recommended.  2/1 Echocardiogram:Left ventricle:  mild LVH. -LVEF = 40% to 45%. Diffuse hypokinesis. -(grade 1   diastolic dysfunction). - Mitral valve: There was mild regurgitation. - Pulmonary arteries: . PA  peak pressure: 40 mm Hg (S).   VENTILATOR SETTINGS:    Cultures   Antimicrobials:    Devices    LINES / TUBES:      Continuous Infusions: . heparin 850 Units/hr (05/14/16 0525)     Objective: Vitals:   05/13/16 1015 05/13/16 1631 05/13/16 2027 05/14/16 0536  BP:  101/73 102/63 133/84  Pulse:  96 85 99  Resp:  18 18 18   Temp:  98.8 F (37.1 C) 98.7 F (37.1 C) 98.1 F (36.7 C)  TempSrc:  Oral Oral Oral  SpO2: 94% 99% 100% 97%  Weight:    83 kg (183 lb)  Height:        Intake/Output Summary (Last 24 hours) at 05/14/16 0750 Last data filed at 05/14/16 0600  Gross per 24 hour  Intake            737.3 ml  Output                0 ml  Net            737.3 ml    Filed Weights   05/12/16 0156 05/13/16 0535 05/14/16 0536  Weight: 83.4 kg (183 lb 14.4 oz) 82.4 kg (181 lb 9.6 oz) 83 kg (183 lb)    Examination:  General: A/O 4, NAD, No acute respiratory distress Eyes: negative scleral hemorrhage, negative anisocoria, negative icterus ENT: Negative Runny nose, negative gingival bleeding, Neck:  Negative scars, masses, torticollis, lymphadenopathy, JVD Lungs: Clear to auscultation bilaterally without wheezes or crackles Cardiovascular: Regular rate and rhythm without murmur gallop or rub normal S1 and S2. Left chest wall consistent with mastectomy Abdomen: negative abdominal pain, nondistended, positive soft, bowel sounds, no rebound, no ascites, no appreciable mass Extremities: No significant cyanosis, clubbing, or edema bilateral lower extremities Skin: Negative rashes, lesions, ulcers Psychiatric:  Negative depression, negative anxiety, negative fatigue, negative mania  Central nervous system:  Cranial nerves II through XII intact, tongue/uvula midline, all extremities muscle strength 5/5, sensation intact throughout, negative dysarthria, negative expressive aphasia, negative receptive aphasia.  .     Data Reviewed: Care during the described time interval was provided by me .  I have reviewed this patient's available data, including medical history, events of note, physical examination, and all test results  as part of my evaluation. I have personally reviewed and interpreted all radiology studies.  CBC:  Recent Labs Lab 05/09/16 1518 05/11/16 1507 05/11/16 1832 05/13/16 0428 05/14/16 0353  WBC 7.8 7.7 8.2 8.5 7.7  NEUTROABS 5,382 5.6  --   --   --   HGB 9.6* 9.3* 9.7* 8.6* 8.2*  HCT 30.8* 29.4* 30.1* 26.8* 25.7*  MCV 100.0 96.7 96.2 96.1 95.9  PLT 375 313 344 314 [id number]   Basic Metabolic Panel:  Recent Labs Lab 05/11/16 1507 05/11/16 1832 05/13/16 0428 05/14/16 0353  NA 137 136 135 136  K 4.7 4.4 4.1 4.0  CL  --  102 102 105   CO2 22 21* 21* 22  GLUCOSE 114 129* 92 158*  BUN 11.6 10 10 10   CREATININE 0.9 0.95 0.86 0.87  CALCIUM 10.2 9.9 9.1 9.0   GFR: Estimated Creatinine Clearance: 62.7 mL/min (by C-G formula based on SCr of 0.87 mg/dL). Liver Function Tests:  Recent Labs Lab 05/11/16 1507  AST 32  ALT 24  ALKPHOS 101  BILITOT 0.50  PROT 7.4  ALBUMIN 2.8*   No results for input(s): LIPASE, AMYLASE in the last 168 hours. No results for input(s): AMMONIA in the last 168 hours. Coagulation Profile:  Recent Labs Lab 05/11/16 2028  INR 1.44   Cardiac Enzymes:  Recent Labs Lab 05/12/16 0454 05/13/16 0952 05/13/16 1349 05/13/16 2040  TROPONINI 0.10* 0.09* 0.09* 0.10*   BNP (last 3 results) No results for input(s): PROBNP in the last 8760 hours. HbA1C: No results for input(s): HGBA1C in the last 72 hours. CBG:  Recent Labs Lab 05/13/16 0732 05/13/16 1145 05/13/16 1634 05/13/16 2130 05/14/16 0738  GLUCAP 106* 145* 151* 155* 133*   Lipid Profile: No results for input(s): CHOL, HDL, LDLCALC, TRIG, CHOLHDL, LDLDIRECT in the last 72 hours. Thyroid Function Tests: No results for input(s): TSH, T4TOTAL, FREET4, T3FREE, THYROIDAB in the last 72 hours. Anemia Panel:  Recent Labs  05/11/16 1507 05/13/16 1349  VITAMINB12  --  478  FOLATE  --  21.3  FERRITIN 979* 770*  TIBC  --  185*  IRON  --  22*  RETICCTPCT 8.10* 4.8*   Sepsis Labs: No results for input(s): PROCALCITON, LATICACIDVEN in the last 168 hours.  No results found for this or any previous visit (from the past 240 hour(s)).       Radiology Studies: No results found.      Scheduled Meds: . anastrozole  1 mg Oral Daily  . atorvastatin  20 mg Oral QHS  . azithromycin  250 mg Oral Daily  . famotidine  10 mg Oral Daily  . insulin aspart  0-5 Units Subcutaneous QHS  . insulin aspart  0-9 Units Subcutaneous TID WC  . metoprolol tartrate  25 mg Oral BID  . pantoprazole  40 mg Oral BID AC   Continuous  Infusions: . heparin 850 Units/hr (05/14/16 0525)     LOS: 2 days    Time spent:40 min    Nakea Gouger, Geraldo Docker, MD [facility] Pager [phone]  If 7PM-7AM, please contact night-coverage [email or web address] Password [email or web address] 05/14/2016, 7:50 AM

## 2016-05-14 NOTE — Progress Notes (Signed)
     [facility] Progress Note  Chief Complaint:   anemia  Subjective: She feels okay today, was surprised to hear that hemoccult was positive.   Objective:  Vital signs in last 24 hours: Temp:  [97.9 F (36.6 C)-98.8 F (37.1 C)] 97.9 F (36.6 C) (02/03 1218) Pulse Rate:  [85-99] 93 (02/03 1218) Resp:  [18-20] 20 (02/03 1218) BP: (101-133)/(63-84) 117/82 (02/03 1218) SpO2:  [97 %-100 %] 100 % (02/03 1218) Weight:  [183 lb (83 kg)] 183 lb (83 kg) (02/03 0536) Last BM Date: 05/12/16 General:   Alert, well-developed,black female in NAD EENT:  Normal hearing, non icteric sclera, conjunctive pink.  Heart:  Regular rate and rhythm Pulm: Normal respiratory effort, lungs CTA bilaterally without wheezes or crackles. Abdomen:  Soft, nondistended, nontender.  Normal bowel sounds, no masses felt. No hepatomegaly.    Neurologic:  Alert and  oriented x4;  grossly normal neurologically. Psych:  Alert and cooperative. Normal mood and affect.   Intake/Output from previous day: 02/02 0701 - 02/03 0700 In: 737.3 [P.O.:660; I.V.:77.3] Out: 0  Intake/Output this shift: Total I/O In: -  Out: 700 [Urine:700]  Lab Results:  Recent Labs  05/11/16 1832 05/13/16 0428 05/14/16 0353  WBC 8.2 8.5 7.7  HGB 9.7* 8.6* 8.2*  HCT 30.1* 26.8* 25.7*  PLT 344 314 327   BMET  Recent Labs  05/11/16 1832 05/13/16 0428 05/14/16 0353  NA 136 135 136  K 4.4 4.1 4.0  CL 102 102 105  CO2 21* 21* 22  GLUCOSE 129* 92 158*  BUN 10 10 10   CREATININE 0.95 0.86 0.87  CALCIUM 9.9 9.1 9.0   LFT  Recent Labs  05/11/16 1507  PROT 7.4  ALBUMIN 2.8*  AST 32  ALT 24  ALKPHOS 101  BILITOT 0.50   PT/INR  Recent Labs  05/11/16 2028  LABPROT 17.7*  INR 1.44    Assessment / Plan:  1. Normocytic anemia.  Ferritin 770, TIBC low. Probably anemia of chronic disease and also chemo effects? This doesn't seem to be iron deficiency but she is heme positive so EGD / colonoscopy is  reasonable. Having heart cath Monday.   2. DVT /PE . Started Eliquis 2 weeks ago (on hold now and getting IV heparin).   3. Breast cancer diagnosed in April 2017, s/p left mastectomy and initiation of chemo in [place]. Moved to [place], now under care of Dr. Jana Hakim and has been started on Arimidex  4. Cardiomyopathy. For cardiac cath on 05/16/16 to look for causes.  5. ? Cirrhosis by CTscan -check for chronic HBV / HCV   Principal Problem:   Hypoxia Active Problems:   Type 2 diabetes mellitus without complication, without long-term current use of insulin (HCC)   Acute saddle pulmonary embolism with acute cor pulmonale (HCC)   Anemia   Abnormal echocardiogram   Elevated troponin    LOS: 2 days   Tye Savoy NP 05/14/2016, 12:50 PM  Pager number [phone]     Attending physician's note   I have taken an interval history, reviewed the chart and examined the patient. I agree with the Advanced Practitioner's note, impression and recommendations.  * Normocytic anemia and heme + stool. Recommend colonoscopy and EGD after cardiac cath.   * PE/DVT Eliquis on hold. Now on IV heparin.  * CT with a slight nodularity to liver-question cirrhosis. Check HBV, HCV markers.  Lucio Edward, MD [facility] [phone] Mon-Fri 8a-5p [phone] after 5p, weekends, holidays

## 2016-05-14 NOTE — Progress Notes (Signed)
Patient ambulatory in hallway up to nursing station and back, did feel weak and mildly short of breath.  O2 sats remained in the mid to high 90's on room air.

## 2016-05-14 NOTE — Progress Notes (Signed)
[place] for Heparin Indication: hx DVT/PE/Afib  Allergies  Allergen Reactions  . Dust Mite Extract     Patient Measurements: Height: 5\' 2"  (157.5 cm) Weight: 181 lb 9.6 oz (82.4 kg) (scale a) IBW/kg (Calculated) : 50.1 Heparin Dosing Weight: 68.5kg  Vital Signs: Temp: 98.7 F (37.1 C) (02/02 2027) Temp Source: Oral (02/02 2027) BP: 102/63 (02/02 2027) Pulse Rate: 85 (02/02 2027)  Labs:  Recent Labs  05/11/16 1832 05/11/16 2028  05/13/16 0428 05/13/16 0952 05/13/16 1349 05/13/16 2040 05/14/16 0353  HGB 9.7*  --   --  8.6*  --   --   --  8.2*  HCT 30.1*  --   --  26.8*  --   --   --  25.7*  PLT 344  --   --  314  --   --   --  327  APTT  --   --   --   --   --   --   --  125*  LABPROT  --  17.7*  --   --   --   --   --   --   INR  --  1.44  --   --   --   --   --   --   CREATININE 0.95  --   --  0.86  --   --   --  0.87  TROPONINI  --   --   < >  --  0.09* 0.09* 0.10*  --   < > = values in this interval not displayed.  Estimated Creatinine Clearance: 62.4 mL/min (by C-G formula based on SCr of 0.87 mg/dL).   Assessment: 65 y.o. female with h/o Afib/VTE, Eliquis on hold while awaiting cath Monday, for heparin  Goal of Therapy:  APTT 66-102 seconds Heparin level 0.3-0.7 units/ml Monitor platelets by anticoagulation protocol: Yes   Plan:  Decrease Heparin 850 units/hr APTT at noon  Koleson Reifsteck, Bronson Curb 05/14/2016,5:12 AM

## 2016-05-14 NOTE — Progress Notes (Signed)
[place] for Heparin Indication: hx DVT/PE/Afib  Allergies  Allergen Reactions  . Dust Mite Extract     Patient Measurements: Height: 5\' 2"  (157.5 cm) Weight: 183 lb (83 kg) IBW/kg (Calculated) : 50.1 Heparin Dosing Weight: 68.5kg  Vital Signs: Temp: 97.9 F (36.6 C) (02/03 1218) Temp Source: Oral (02/03 1218) BP: 117/82 (02/03 1218) Pulse Rate: 93 (02/03 1218)  Labs:  Recent Labs  05/11/16 1832 05/11/16 2028  05/13/16 0428 05/13/16 0952 05/13/16 1349 05/13/16 2040 05/14/16 0353 05/14/16 1350  HGB 9.7*  --   --  8.6*  --   --   --  8.2*  --   HCT 30.1*  --   --  26.8*  --   --   --  25.7*  --   PLT 344  --   --  314  --   --   --  327  --   APTT  --   --   --   --   --   --   --  125* 102*  LABPROT  --  17.7*  --   --   --   --   --   --   --   INR  --  1.44  --   --   --   --   --   --   --   HEPARINUNFRC  --   --   --   --   --   --   --  1.82*  --   CREATININE 0.95  --   --  0.86  --   --   --  0.87  --   TROPONINI  --   --   < >  --  0.09* 0.09* 0.10*  --   --   < > = values in this interval not displayed.  Estimated Creatinine Clearance: 62.7 mL/min (by C-G formula based on SCr of 0.87 mg/dL).   Assessment: 66 y.o. female with h/o Afib/VTE, Eliquis on hold (last dose 2/2 AM) while awaiting cath Monday. Pharmacy to dose heparin. Following aPTT currently and is at high end of therapeutic range. Will empirically reduce to keep in goal. Hgb/ plt low, but stable. No overt s/s bleeding.   Goal of Therapy:  APTT 66-102 seconds Heparin level 0.3-0.7 units/ml Monitor platelets by anticoagulation protocol: Yes   Plan:  Decrease Heparin 750 units/hr APTT at 2100 Daily heparin level, aPTT, and CBC Monitor for s/s bleeding F/u resuming AC after cath   Argie Ramming, PharmD Pharmacy Resident  Pager [phone] 05/14/16 2:27 PM

## 2016-05-14 NOTE — Progress Notes (Signed)
[place] for Heparin Indication: hx DVT/PE/Afib  Allergies  Allergen Reactions  . Dust Mite Extract     Patient Measurements: Height: 5\' 2"  (157.5 cm) Weight: 183 lb (83 kg) IBW/kg (Calculated) : 50.1 Heparin Dosing Weight: 68.5kg  Vital Signs: Temp: 98.1 F (36.7 C) (02/03 2135) Temp Source: Oral (02/03 2135) BP: 117/76 (02/03 2135) Pulse Rate: 88 (02/03 2135)  Labs:  Recent Labs  05/13/16 0428 05/13/16 0952 05/13/16 1349 05/13/16 2040 05/14/16 0353 05/14/16 1350 05/14/16 2105  HGB 8.6*  --   --   --  8.2*  --   --   HCT 26.8*  --   --   --  25.7*  --   --   PLT 314  --   --   --  327  --   --   APTT  --   --   --   --  125* 102* 90*  HEPARINUNFRC  --   --   --   --  1.82*  --   --   CREATININE 0.86  --   --   --  0.87  --   --   TROPONINI  --  0.09* 0.09* 0.10*  --   --   --     Estimated Creatinine Clearance: 62.7 mL/min (by C-G formula based on SCr of 0.87 mg/dL).   Assessment: 66 y.o. female with h/o Afib/VTE, Eliquis on hold (last dose 2/2 AM) while awaiting cath Monday. Pharmacy to dose heparin.  -aPTT is at goal on 750 units/hr  Goal of Therapy:  APTT 66-102 seconds Heparin level 0.3-0.7 units/ml Monitor platelets by anticoagulation protocol: Yes   Plan:   No heparin changes needed Daily heparin level, aPTT, and CBC F/u resuming AC after cath   Hildred Laser, Pharm D 05/14/2016 10:25 PM

## 2016-05-14 NOTE — Progress Notes (Signed)
DAILY PROGRESS NOTE  Subjective:  No events overnight. Now on IV heparin. GI evaluation recommended stool card - that was being sent by the nurse this morning. She denies gross melena with BM today.  Objective:  Temp:  [98.1 F (36.7 C)-98.8 F (37.1 C)] 98.1 F (36.7 C) (02/03 0536) Pulse Rate:  [85-99] 97 (02/03 0928) Resp:  [18] 18 (02/03 0536) BP: (101-133)/(63-84) 133/84 (02/03 0536) SpO2:  [97 %-100 %] 100 % (02/03 0928) Weight:  [183 lb (83 kg)] 183 lb (83 kg) (02/03 0536) Weight change: 1 lb 6.4 oz (0.635 kg)  Intake/Output from previous day: 02/02 0701 - 02/03 0700 In: 737.3 [P.O.:660; I.V.:77.3] Out: 0   Intake/Output from this shift: Total I/O In: -  Out: 400 [Urine:400]  Medications: No current facility-administered medications on file prior to encounter.    Current Outpatient Prescriptions on File Prior to Encounter  Medication Sig Dispense Refill  . apixaban (ELIQUIS) 5 MG TABS tablet Take 1 tablet (5 mg total) by mouth 2 (two) times daily. (Patient taking differently: Take 5 mg by mouth daily. ) 60 tablet 0  . atorvastatin (LIPITOR) 20 MG tablet Take 1 tablet (20 mg total) by mouth at bedtime. Can switch to a cheaper statin if needed 30 tablet 0  . folic acid (FOLVITE) 468 MCG tablet Take 800 mcg by mouth daily.    . metFORMIN (GLUCOPHAGE) 1000 MG tablet Take 1 tablet (1,000 mg total) by mouth 2 (two) times daily with a meal. 60 tablet 0  . omeprazole (PRILOSEC) 20 MG capsule Take 20 mg by mouth daily.    . ranitidine (ZANTAC) 300 MG tablet Take 300 mg by mouth at bedtime.    [name] apixaban (ELIQUIS) 5 MG TABS tablet Take 2 tablets (10 mg total) by mouth 2 (two) times daily. 15 tablet 0  . glucose monitoring kit (FREESTYLE) monitoring kit 1 each by Does not apply route 4 (four) times daily - after meals and at bedtime. 1 month Diabetic Testing Supplies for QAC-QHS accuchecks.  Diagnosis E11.65. Any brand OK 1 each 1  . traMADol (ULTRAM) 50 MG tablet Take 1  tablet (50 mg total) by mouth every 6 (six) hours as needed. (Patient not taking: Reported on 05/11/2016) 60 tablet 1    Physical Exam: General appearance: alert and no distress Neck: no carotid bruit and no JVD Lungs: clear to auscultation bilaterally Heart: regular rate and rhythm, S1, S2 normal, no murmur, click, rub or gallop Abdomen: soft, non-tender; bowel sounds normal; no masses,  no organomegaly Extremities: extremities normal, atraumatic, no cyanosis or edema Pulses: 2+ and symmetric Skin: Skin color, texture, turgor normal. No rashes or lesions Neurologic: Grossly normal Psych: Pleasant  Lab Results: Results for orders placed or performed during the hospital encounter of 05/11/16 (from the past 48 hour(s))  Glucose, capillary     Status: Abnormal   Collection Time: 05/12/16  4:27 PM  Result Value Ref Range   Glucose-Capillary 153 (H) 65 - 99 mg/dL   Comment 1 Notify RN   Glucose, capillary     Status: Abnormal   Collection Time: 05/12/16  9:16 PM  Result Value Ref Range   Glucose-Capillary 138 (H) 65 - 99 mg/dL  Basic metabolic panel     Status: Abnormal   Collection Time: 05/13/16  4:28 AM  Result Value Ref Range   Sodium 135 135 - 145 mmol/L   Potassium 4.1 3.5 - 5.1 mmol/L   Chloride 102 101 - 111 mmol/L  CO2 21 (L) 22 - 32 mmol/L   Glucose, Bld 92 65 - 99 mg/dL   BUN 10 6 - 20 mg/dL   Creatinine, Ser 0.86 0.44 - 1.00 mg/dL   Calcium 9.1 8.9 - 10.3 mg/dL   GFR calc non Af Amer >60 >60 mL/min   GFR calc Af Amer >60 >60 mL/min    Comment: (NOTE) The eGFR has been calculated using the CKD EPI equation. This calculation has not been validated in all clinical situations. eGFR's persistently <60 mL/min signify possible Chronic Kidney Disease.    Anion gap 12 5 - 15  CBC     Status: Abnormal   Collection Time: 05/13/16  4:28 AM  Result Value Ref Range   WBC 8.5 4.0 - 10.5 K/uL   RBC 2.79 (L) 3.87 - 5.11 MIL/uL   Hemoglobin 8.6 (L) 12.0 - 15.0 g/dL   HCT 26.8  (L) 36.0 - 46.0 %   MCV 96.1 78.0 - 100.0 fL   MCH 30.8 26.0 - 34.0 pg   MCHC 32.1 30.0 - 36.0 g/dL   RDW 17.4 (H) 11.5 - 15.5 %   Platelets 314 150 - 400 K/uL  Glucose, capillary     Status: Abnormal   Collection Time: 05/13/16  7:32 AM  Result Value Ref Range   Glucose-Capillary 106 (H) 65 - 99 mg/dL  Troponin I (q 6hr x 3)     Status: Abnormal   Collection Time: 05/13/16  9:52 AM  Result Value Ref Range   Troponin I 0.09 (HH) <0.03 ng/mL    Comment: CRITICAL VALUE NOTED.  VALUE IS CONSISTENT WITH PREVIOUSLY REPORTED AND CALLED VALUE.  Glucose, capillary     Status: Abnormal   Collection Time: 05/13/16 11:45 AM  Result Value Ref Range   Glucose-Capillary 145 (H) 65 - 99 mg/dL   Comment 1 Notify RN   Troponin I (q 6hr x 3)     Status: Abnormal   Collection Time: 05/13/16  1:49 PM  Result Value Ref Range   Troponin I 0.09 (HH) <0.03 ng/mL    Comment: CRITICAL VALUE NOTED.  VALUE IS CONSISTENT WITH PREVIOUSLY REPORTED AND CALLED VALUE.  Vitamin B12     Status: None   Collection Time: 05/13/16  1:49 PM  Result Value Ref Range   Vitamin B-12 478 180 - 914 pg/mL    Comment: (NOTE) This assay is not validated for testing neonatal or myeloproliferative syndrome specimens for Vitamin B12 levels.   Folate     Status: None   Collection Time: 05/13/16  1:49 PM  Result Value Ref Range   Folate 21.3 >5.9 ng/mL  Iron and TIBC     Status: Abnormal   Collection Time: 05/13/16  1:49 PM  Result Value Ref Range   Iron 22 (L) 28 - 170 ug/dL   TIBC 185 (L) 250 - 450 ug/dL   Saturation Ratios 12 10.4 - 31.8 %   UIBC 163 ug/dL  Ferritin     Status: Abnormal   Collection Time: 05/13/16  1:49 PM  Result Value Ref Range   Ferritin 770 (H) 11 - 307 ng/mL  Reticulocytes     Status: Abnormal   Collection Time: 05/13/16  1:49 PM  Result Value Ref Range   Retic Ct Pct 4.8 (H) 0.4 - 3.1 %   RBC. 3.09 (L) 3.87 - 5.11 MIL/uL   Retic Count, Manual 148.3 19.0 - 186.0 K/uL  Glucose, capillary      Status: Abnormal   Collection  Time: 05/13/16  4:34 PM  Result Value Ref Range   Glucose-Capillary 151 (H) 65 - 99 mg/dL   Comment 1 Notify RN   Troponin I (q 6hr x 3)     Status: Abnormal   Collection Time: 05/13/16  8:40 PM  Result Value Ref Range   Troponin I 0.10 (HH) <0.03 ng/mL    Comment: CRITICAL VALUE NOTED.  VALUE IS CONSISTENT WITH PREVIOUSLY REPORTED AND CALLED VALUE.  Glucose, capillary     Status: Abnormal   Collection Time: 05/13/16  9:30 PM  Result Value Ref Range   Glucose-Capillary 155 (H) 65 - 99 mg/dL   Comment 1 Notify RN    Comment 2 Document in Chart   APTT     Status: Abnormal   Collection Time: 05/14/16  3:53 AM  Result Value Ref Range   aPTT 125 (H) 24 - 36 seconds    Comment:        IF BASELINE aPTT IS ELEVATED, SUGGEST PATIENT RISK ASSESSMENT BE USED TO DETERMINE APPROPRIATE ANTICOAGULANT THERAPY.   Heparin level (unfractionated)     Status: Abnormal   Collection Time: 05/14/16  3:53 AM  Result Value Ref Range   Heparin Unfractionated 1.82 (H) 0.30 - 0.70 IU/mL    Comment: RESULTS CONFIRMED BY MANUAL DILUTION        IF HEPARIN RESULTS ARE BELOW EXPECTED VALUES, AND PATIENT DOSAGE HAS BEEN CONFIRMED, SUGGEST FOLLOW UP TESTING OF ANTITHROMBIN III LEVELS.   CBC     Status: Abnormal   Collection Time: 05/14/16  3:53 AM  Result Value Ref Range   WBC 7.7 4.0 - 10.5 K/uL   RBC 2.68 (L) 3.87 - 5.11 MIL/uL   Hemoglobin 8.2 (L) 12.0 - 15.0 g/dL   HCT 25.7 (L) 36.0 - 46.0 %   MCV 95.9 78.0 - 100.0 fL   MCH 30.6 26.0 - 34.0 pg   MCHC 31.9 30.0 - 36.0 g/dL   RDW 17.5 (H) 11.5 - 15.5 %   Platelets 327 150 - 400 K/uL  Basic metabolic panel     Status: Abnormal   Collection Time: 05/14/16  3:53 AM  Result Value Ref Range   Sodium 136 135 - 145 mmol/L   Potassium 4.0 3.5 - 5.1 mmol/L   Chloride 105 101 - 111 mmol/L   CO2 22 22 - 32 mmol/L   Glucose, Bld 158 (H) 65 - 99 mg/dL   BUN 10 6 - 20 mg/dL   Creatinine, Ser 0.87 0.44 - 1.00 mg/dL   Calcium  9.0 8.9 - 10.3 mg/dL   GFR calc non Af Amer >60 >60 mL/min   GFR calc Af Amer >60 >60 mL/min    Comment: (NOTE) The eGFR has been calculated using the CKD EPI equation. This calculation has not been validated in all clinical situations. eGFR's persistently <60 mL/min signify possible Chronic Kidney Disease.    Anion gap 9 5 - 15  Glucose, capillary     Status: Abnormal   Collection Time: 05/14/16  7:38 AM  Result Value Ref Range   Glucose-Capillary 133 (H) 65 - 99 mg/dL   Comment 1 Notify RN   Glucose, capillary     Status: Abnormal   Collection Time: 05/14/16 11:36 AM  Result Value Ref Range   Glucose-Capillary 168 (H) 65 - 99 mg/dL   Comment 1 Notify RN     Imaging: No results found.  Assessment:  1. Principal Problem: 2.   Hypoxia 3. Active Problems: 4.  Type 2 diabetes mellitus without complication, without long-term current use of insulin ([place]) 5.   Acute saddle pulmonary embolism with acute cor pulmonale (HCC) 6.   Anemia 7.   Abnormal echocardiogram 8.   Elevated troponin 9.   Plan: 1.  No chest pain - remains tachycardic. Abdominal pain improved. No melena - stool card sent. Tentative plan for cath on Monday to evaluate for cause of cardiomyopathy. On IV heparin. Change metoprolol tartrate to carvedilol at higher dose for demonstrated CHF benefit. Consider adding ARB or Entresto after cath. No antiplatelet for now given anemia. Appears euvolemic on exam - not on diuretic.  Time Spent Directly with Patient:  15 minutes  Length of Stay:  LOS: 2 days   Pixie Casino, MD, [facility] Attending Cardiologist [place] 05/14/2016, 12:06 PM

## 2016-05-14 NOTE — Progress Notes (Signed)
Patient continues to c/o of intermittent "grabbing pain" in the chest if she coughs or hiccups but no other time.  According to patient and notes this is the same type of pain she has been having since prior to admit.   Patient up and about in room during day shift, sat up in the chair and on the side of the bed for meals.  Vital signs remain stable.

## 2016-05-15 DIAGNOSIS — E1165 Type 2 diabetes mellitus with hyperglycemia: Secondary | ICD-10-CM

## 2016-05-15 DIAGNOSIS — R748 Abnormal levels of other serum enzymes: Secondary | ICD-10-CM | POA: Diagnosis not present

## 2016-05-15 DIAGNOSIS — I429 Cardiomyopathy, unspecified: Secondary | ICD-10-CM | POA: Diagnosis not present

## 2016-05-15 DIAGNOSIS — E118 Type 2 diabetes mellitus with unspecified complications: Secondary | ICD-10-CM

## 2016-05-15 DIAGNOSIS — I2602 Saddle embolus of pulmonary artery with acute cor pulmonale: Secondary | ICD-10-CM | POA: Diagnosis not present

## 2016-05-15 DIAGNOSIS — R0902 Hypoxemia: Secondary | ICD-10-CM | POA: Diagnosis not present

## 2016-05-15 DIAGNOSIS — D649 Anemia, unspecified: Secondary | ICD-10-CM | POA: Diagnosis not present

## 2016-05-15 DIAGNOSIS — R079 Chest pain, unspecified: Secondary | ICD-10-CM | POA: Diagnosis not present

## 2016-05-15 DIAGNOSIS — I5023 Acute on chronic systolic (congestive) heart failure: Secondary | ICD-10-CM | POA: Diagnosis not present

## 2016-05-15 DIAGNOSIS — C779 Secondary and unspecified malignant neoplasm of lymph node, unspecified: Secondary | ICD-10-CM

## 2016-05-15 DIAGNOSIS — R932 Abnormal findings on diagnostic imaging of liver and biliary tract: Secondary | ICD-10-CM | POA: Diagnosis not present

## 2016-05-15 DIAGNOSIS — J9601 Acute respiratory failure with hypoxia: Secondary | ICD-10-CM | POA: Diagnosis not present

## 2016-05-15 DIAGNOSIS — I5021 Acute systolic (congestive) heart failure: Secondary | ICD-10-CM

## 2016-05-15 DIAGNOSIS — C50912 Malignant neoplasm of unspecified site of left female breast: Secondary | ICD-10-CM

## 2016-05-15 DIAGNOSIS — I248 Other forms of acute ischemic heart disease: Secondary | ICD-10-CM | POA: Diagnosis not present

## 2016-05-15 DIAGNOSIS — I272 Pulmonary hypertension, unspecified: Secondary | ICD-10-CM | POA: Diagnosis not present

## 2016-05-15 DIAGNOSIS — IMO0002 Reserved for concepts with insufficient information to code with codable children: Secondary | ICD-10-CM

## 2016-05-15 DIAGNOSIS — R931 Abnormal findings on diagnostic imaging of heart and coronary circulation: Secondary | ICD-10-CM | POA: Diagnosis not present

## 2016-05-15 DIAGNOSIS — R195 Other fecal abnormalities: Secondary | ICD-10-CM | POA: Diagnosis not present

## 2016-05-15 LAB — GLUCOSE, CAPILLARY
GLUCOSE-CAPILLARY: 300 mg/dL — AB (ref 65–99)
Glucose-Capillary: 141 mg/dL — ABNORMAL HIGH (ref 65–99)
Glucose-Capillary: 155 mg/dL — ABNORMAL HIGH (ref 65–99)
Glucose-Capillary: 144 mg/dL — ABNORMAL HIGH (ref 65–99)

## 2016-05-15 LAB — CBC
HEMATOCRIT: 26.6 % — AB (ref 36.0–46.0)
HEMOGLOBIN: 8.4 g/dL — AB (ref 12.0–15.0)
MCH: 30.1 pg (ref 26.0–34.0)
MCHC: 31.6 g/dL (ref 30.0–36.0)
MCV: 95.3 fL (ref 78.0–100.0)
Platelets: 383 10*3/uL (ref 150–400)
RBC: 2.79 MIL/uL — ABNORMAL LOW (ref 3.87–5.11)
RDW: 16.9 % — ABNORMAL HIGH (ref 11.5–15.5)
WBC: 7.8 10*3/uL (ref 4.0–10.5)

## 2016-05-15 LAB — BASIC METABOLIC PANEL
Anion gap: 8 (ref 5–15)
BUN: 13 mg/dL (ref 6–20)
CO2: 23 mmol/L (ref 22–32)
Calcium: 9.2 mg/dL (ref 8.9–10.3)
Chloride: 105 mmol/L (ref 101–111)
Creatinine, Ser: 0.9 mg/dL (ref 0.44–1.00)
GFR calc Af Amer: >60 mL/min (ref >60)
Glucose, Bld: 159 mg/dL — ABNORMAL HIGH (ref 65–99)
POTASSIUM: 3.6 mmol/L (ref 3.5–5.1)
Sodium: 136 mmol/L (ref 135–145)

## 2016-05-15 LAB — HEPARIN LEVEL (UNFRACTIONATED)
Heparin Unfractionated: 0.8 IU/mL — ABNORMAL HIGH (ref 0.30–0.70)
Heparin Unfractionated: 0.52 IU/mL (ref 0.30–0.70)

## 2016-05-15 LAB — APTT: APTT: 115 s — AB (ref 24–36)

## 2016-05-15 LAB — MAGNESIUM: Magnesium: 1.5 mg/dL — ABNORMAL LOW (ref 1.7–2.4)

## 2016-05-15 MED ORDER — PEG-KCL-NACL-NASULF-NA ASC-C 100 G PO SOLR
0.5000 | Freq: Once | ORAL | Status: AC
  Administered 2016-05-16: 100 g via ORAL

## 2016-05-15 MED ORDER — PEG-KCL-NACL-NASULF-NA ASC-C 100 G PO SOLR
0.5000 | Freq: Once | ORAL | Status: AC
  Administered 2016-05-15: 100 g via ORAL
  Filled 2016-05-15: qty 1

## 2016-05-15 MED ORDER — PEG-KCL-NACL-NASULF-NA ASC-C 100 G PO SOLR: 1.0000 | Freq: Once | ORAL | Status: DC

## 2016-05-15 MED ORDER — ONDANSETRON HCL 4 MG/2ML IJ SOLN: 4.0000 mg | Freq: Four times a day (QID) | INTRAMUSCULAR | Status: DC | PRN

## 2016-05-15 MED ORDER — CARVEDILOL 12.5 MG PO TABS
12.5000 mg | ORAL_TABLET | Freq: Two times a day (BID) | ORAL | Status: DC
  Administered 2016-05-15 – 2016-05-22 (×14): 12.5 mg via ORAL
  Filled 2016-05-15 (×15): qty 1

## 2016-05-15 NOTE — Progress Notes (Signed)
ANTICOAGULATION CONSULT NOTE - Follow Up Consult  Pharmacy Consult for Heparin (Apixaban on hold) Indication: atrial fibrillation, Hx DVT/PE  Allergies  Allergen Reactions  . Dust Mite Extract     Patient Measurements: Height: 5\' 2"  (157.5 cm) Weight: 182 lb 11.2 oz (82.9 kg) IBW/kg (Calculated) : 50.1  Vital Signs: Temp: 98.3 F (36.8 C) (02/04 0410) Temp Source: Oral (02/04 0410) BP: 113/75 (02/04 0410) Pulse Rate: 82 (02/04 0410)  Labs:  Recent Labs  05/13/16 0428 05/13/16 [id number] 05/13/16 1349 05/13/16 2040  05/14/16 0353 05/14/16 1350 05/14/16 2105 05/15/16 0418  HGB 8.6*  --   --   --   --  8.2*  --   --  8.4*  HCT 26.8*  --   --   --   --  25.7*  --   --  26.6*  PLT 314  --   --   --   --  327  --   --  383  APTT  --   --   --   --   < > 125* 102* 90* 115*  HEPARINUNFRC  --   --   --   --   --  1.82*  --  1.11* 0.80*  CREATININE 0.86  --   --   --   --  0.87  --   --   --   TROPONINI  --  0.09* 0.09* 0.10*  --   --   --   --   --   < > = values in this interval not displayed.  Estimated Creatinine Clearance: 62.6 mL/min (by C-G formula based on SCr of 0.87 mg/dL).    Assessment: Heparin while apixaban on hold. APTT and heparin level both elevated this AM and appear to be correlating>>will use heparin level only to dose from now on.   Goal of Therapy:  Heparin level 0.3-0.7 units/ml Monitor platelets by anticoagulation protocol: Yes   Plan:  -Dec heparin to 600 units/hr -1400 HL  Narda Bonds 05/15/2016,6:03 AM

## 2016-05-15 NOTE — Progress Notes (Signed)
ANTICOAGULATION CONSULT NOTE - Follow Up Consult  Pharmacy Consult for Heparin (Apixaban on hold) Indication: atrial fibrillation, Hx DVT/PE  Allergies  Allergen Reactions  . Dust Mite Extract     Patient Measurements: Height: 5\' 2"  (157.5 cm) Weight: 182 lb 11.2 oz (82.9 kg) IBW/kg (Calculated) : 50.1  Vital Signs: Temp: 98.6 F (37 C) (02/04 1218) Temp Source: Oral (02/04 1218) BP: 102/61 (02/04 1218) Pulse Rate: 80 (02/04 1218)  Labs:  Recent Labs  05/13/16 0428 05/13/16 [id number] 05/13/16 1349 05/13/16 2040  05/14/16 0353 05/14/16 1350 05/14/16 2105 05/15/16 0418 05/15/16 1404  HGB 8.6*  --   --   --   --  8.2*  --   --  8.4*  --   HCT 26.8*  --   --   --   --  25.7*  --   --  26.6*  --   PLT 314  --   --   --   --  327  --   --  383  --   APTT  --   --   --   --   < > 125* 102* 90* 115*  --   HEPARINUNFRC  --   --   --   --   < > 1.82*  --  1.11* 0.80* 0.52  CREATININE 0.86  --   --   --   --  0.87  --   --  0.90  --   TROPONINI  --  0.09* 0.09* 0.10*  --   --   --   --   --   --   < > = values in this interval not displayed.  Estimated Creatinine Clearance: 60.5 mL/min (by C-G formula based on SCr of 0.9 mg/dL).   Assessment: 65 y.o. female with h/o Afib/VTE, Eliquis on hold (last dose 2/2 AM) while awaiting cath Monday. Pharmacy to dose heparin. Heparin level therapeutic at 0.52. Hgb low, but stable. No overt s/s bleeding. Using only heparin level for dosing at this time.   Goal of Therapy:  Heparin level 0.3-0.7 units/ml Monitor platelets by anticoagulation protocol: Yes   Plan:  Continue heparin gtt at 600 units/hr Daily heparin level and CBC Monitor for s/s bleeding Follow cath plan   Argie Ramming, PharmD Pharmacy Resident  Pager [phone] 05/15/16 2:45 PM

## 2016-05-15 NOTE — Progress Notes (Signed)
Patient with no complaints on 7 a to 7 p shift.  Denied any chest pain today, does get weak and a little short of breath with ambulation or exertion (such as showering) however maintains sats in high 90's on room air.  Patient has started her Moviprep in anticipation of EGD/colonoscopy tomorrow.  Did discuss with patient she will do another moviprep at 3 a.m. And then NPO until after endoscopy.

## 2016-05-15 NOTE — Progress Notes (Signed)
PROGRESS NOTE    Carrie Watts  [id number] DOB: 1951/03/05 DOA: 05/11/2016 PCP: No PCP Per Patient     Brief Narrative:  66 y.o.BF PMHx CVA,  Atrial fibrillation ,Grade 2, mixed infiltrating lobular and ductal, involving multiple quadrants Left Breast cancer S/P left mastectomy and axillary lymph node dissection at [facility] in [place] , [place] chemotherapy), Saddle PE and DVT (Dx 1/14), new DM Type 2, HLD, Renal disorder  Who presents with chest pain.  The patient developed chest pain and stroke-like symptoms about two weeks before she flew to the [place] to visit her daughter at the beginning of January. She was admitted here on 1/14 and diagnosed with new saddle PE with right heart strain and started on Lovenox, transitioned to apixaban at discharge.  Since discharge, she has been taking no OTC analgesics, and has not been able to obtain the prescription analgesic she was prescribed. She has obtained her apixaban and is taking it as prescribed (although she told pharmacy tech she had just transitioned to *once* daily dosing). Since discharge, she has had several flares of severe left sided chest pain, sharp and severe, worse with coughing or deep breathing and associated with feeling she can't catch her breath. She also endorses some left sided headache, but no fever, chills, sputum, hemoptysis. On day of admission, she went to Dr. Virgie Dad office, where a CXR appeared to show left lower infiltrate and so she was referred to the ER. Patient due to her dyspnea and pleuritic chest very PCT angiogram was done that showed decreased clot burden. 2-D echo obtained did show a depressed EF with diffuse hypokinesis. Cardiology consulted.    Subjective: 2/4 A/O 4, states her SOB/CP resolved.     Assessment & Plan:   Principal Problem:   Hypoxia Active Problems:   Type 2 diabetes mellitus without complication, without long-term current use of insulin (HCC)   Acute  saddle pulmonary embolism with acute cor pulmonale (HCC)   Anemia   Abnormal echocardiogram   Elevated troponin   Abnormal CT scan, liver   Occult blood in stools   Hypoxia/Dyspnea/Chest pain -Likely multifactorial mole likely secondary to recent acute subtle pulmonary emboli and probable depressed EF with diffuse hypokinesis/cardiomyopathy per 2-D echo of 05/12/2016. -Apixaban to IV heparin per cardiology in anticipation of probable cardiac catheterization on Monday or Tuesday, 05/16/2016.  Acute saddle PE (Dx 1/14) -SSx improved  - IV heparin per cardiology in anticipation of probable cardiac catheterization on Monday, 05/16/2016.  Anemia -GI consulted by cardiology. Per GI note patient scheduled for EEG/colonoscopy on 2/5 therefore cardiac catheterization most likely will be on 2/6   DM type 2 uncontrolled with complication  -0000000 Hemoglobin A1c =12.9  -Hold oral hypoglycemic agents during this hospitalization. -Sensitive SSI   Acute systolic CHF -Cardiac catheterization planned for 2/5 but most likely will occur on 2/6 secondary to EGD/colonoscopy being performed  Pulmonary hypertension -See acute see systolic CHF  GERD/Hiatial hernia -PPI to twice a day. Continue Pepcid.  Grade 2, mixed infiltrating lobular and ductal, involving multiple quadrants Left Breast cancer  -Continue Arimidex. Outpatient follow-up.  Elevated troponin/abnormal 2-D echo -Likely secondary to recent acute PE. Troponin level seem to have plateaued. 2-D echo obtained with a EF of 40-45% with diffuse hypokinesis.      DVT prophylaxis: Heparin drip Code Status: Full Family Communication: None Disposition Plan: ?? Dependent on EGD/colonoscopy/cardiac catheterization findings   Consultants:  Cardiology GI   Procedures/Significant Events:  1/14 CT angiogram chest W  contrast (previous admission):Positive PE ncluding saddle embolus and bilateral lobar and segmentale.- Moderately large  clot burden. Positive for acute CT evidence of right heart strain (RV/LV Ratio = 1.19)  -Patchy nodular ground-glass infiltrative changes in the lungs may be due to multifocal pneumonia, edema, or infarcts. -Metastatic disease would be a less likely consideration but due to history of primary carcinoma, non-contrast chest CT at 3-6 months is recommended.  2/1 Echocardiogram:Left ventricle:  mild LVH. -LVEF = 40% to 45%. Diffuse hypokinesis. -(grade 1   diastolic dysfunction). - Mitral valve: There was mild regurgitation. - Pulmonary arteries: . PA  peak pressure: 40 mm Hg (S).   VENTILATOR SETTINGS:    Cultures   Antimicrobials:    Devices    LINES / TUBES:      Continuous Infusions: . heparin 600 Units/hr (05/15/16 [id number])     Objective: Vitals:   05/14/16 0928 05/14/16 1218 05/14/16 2135 05/15/16 0410  BP:  117/82 117/76 113/75  Pulse: 97 93 88 82  Resp:  20 18 18   Temp:  97.9 F (36.6 C) 98.1 F (36.7 C) 98.3 F (36.8 C)  TempSrc:  Oral Oral Oral  SpO2: 100% 100% 97% 96%  Weight:    82.9 kg (182 lb 11.2 oz)  Height:        Intake/Output Summary (Last 24 hours) at 05/15/16 0655 Last data filed at 05/15/16 0600  Gross per 24 hour  Intake            788.7 ml  Output              800 ml  Net            -11.3 ml   Filed Weights   05/13/16 0535 05/14/16 0536 05/15/16 0410  Weight: 82.4 kg (181 lb 9.6 oz) 83 kg (183 lb) 82.9 kg (182 lb 11.2 oz)    Examination:  General: A/O 4, NAD, No acute respiratory distress Eyes: negative scleral hemorrhage, negative anisocoria, negative icterus ENT: Negative Runny nose, negative gingival bleeding, Neck:  Negative scars, masses, torticollis, lymphadenopathy, JVD Lungs: Clear to auscultation bilaterally without wheezes or crackles Cardiovascular: Regular rate and rhythm without murmur gallop or rub normal S1 and S2. Left chest wall consistent with mastectomy Abdomen: negative abdominal pain, nondistended, positive  soft, bowel sounds, no rebound, no ascites, no appreciable mass Extremities: No significant cyanosis, clubbing, or edema bilateral lower extremities Skin: Negative rashes, lesions, ulcers Psychiatric:  Negative depression, negative anxiety, negative fatigue, negative mania  Central nervous system:  Cranial nerves II through XII intact, tongue/uvula midline, all extremities muscle strength 5/5, sensation intact throughout, negative dysarthria, negative expressive aphasia, negative receptive aphasia.  .     Data Reviewed: Care during the described time interval was provided by me .  I have reviewed this patient's available data, including medical history, events of note, physical examination, and all test results as part of my evaluation. I have personally reviewed and interpreted all radiology studies.  CBC:  Recent Labs Lab 05/09/16 1518 05/11/16 1507 05/11/16 1832 05/13/16 0428 05/14/16 0353 05/15/16 0418  WBC 7.8 7.7 8.2 8.5 7.7 7.8  NEUTROABS 5,382 5.6  --   --   --   --   HGB 9.6* 9.3* 9.7* 8.6* 8.2* 8.4*  HCT 30.8* 29.4* 30.1* 26.8* 25.7* 26.6*  MCV 100.0 96.7 96.2 96.1 95.9 95.3  PLT 375 313 344 314 327 [id number]   Basic Metabolic Panel:  Recent Labs Lab 05/11/16 1507 05/11/16 1832 05/13/16 0428 05/14/16  0353 05/15/16 0418  NA 137 136 135 136 136  K 4.7 4.4 4.1 4.0 3.6  CL  --  102 102 105 105  CO2 22 21* 21* 22 23  GLUCOSE 114 129* 92 158* 159*  BUN 11.6 10 10 10 13   CREATININE 0.9 0.95 0.86 0.87 0.90  CALCIUM 10.2 9.9 9.1 9.0 9.2  MG  --   --   --   --  1.5*   GFR: Estimated Creatinine Clearance: 60.5 mL/min (by C-G formula based on SCr of 0.9 mg/dL). Liver Function Tests:  Recent Labs Lab 05/11/16 1507  AST 32  ALT 24  ALKPHOS 101  BILITOT 0.50  PROT 7.4  ALBUMIN 2.8*   No results for input(s): LIPASE, AMYLASE in the last 168 hours. No results for input(s): AMMONIA in the last 168 hours. Coagulation Profile:  Recent Labs Lab 05/11/16 2028  INR  1.44   Cardiac Enzymes:  Recent Labs Lab 05/12/16 0454 05/13/16 0952 05/13/16 1349 05/13/16 2040  TROPONINI 0.10* 0.09* 0.09* 0.10*   BNP (last 3 results) No results for input(s): PROBNP in the last 8760 hours. HbA1C: No results for input(s): HGBA1C in the last 72 hours. CBG:  Recent Labs Lab 05/13/16 2130 05/14/16 0738 05/14/16 1136 05/14/16 1636 05/14/16 2134  GLUCAP 155* 133* 168* 123* 161*   Lipid Profile: No results for input(s): CHOL, HDL, LDLCALC, TRIG, CHOLHDL, LDLDIRECT in the last 72 hours. Thyroid Function Tests: No results for input(s): TSH, T4TOTAL, FREET4, T3FREE, THYROIDAB in the last 72 hours. Anemia Panel:  Recent Labs  05/13/16 1349  VITAMINB12 478  FOLATE 21.3  FERRITIN 770*  TIBC 185*  IRON 22*  RETICCTPCT 4.8*   Sepsis Labs: No results for input(s): PROCALCITON, LATICACIDVEN in the last 168 hours.  No results found for this or any previous visit (from the past 240 hour(s)).       Radiology Studies: No results found.      Scheduled Meds: . anastrozole  1 mg Oral Daily  . atorvastatin  20 mg Oral QHS  . azithromycin  250 mg Oral Daily  . carvedilol  12.5 mg Oral BID WC  . famotidine  10 mg Oral Daily  . Influenza vac split quadrivalent PF  0.5 mL Intramuscular Tomorrow-1000  . insulin aspart  0-5 Units Subcutaneous QHS  . insulin aspart  0-9 Units Subcutaneous TID WC  . pantoprazole  40 mg Oral BID AC  . pneumococcal 23 valent vaccine  0.5 mL Intramuscular Tomorrow-1000   Continuous Infusions: . heparin 600 Units/hr (05/15/16 0609)     LOS: 3 days    Time spent:40 min    Starla Deller, Geraldo Docker, MD [facility] Pager [phone]  If 7PM-7AM, please contact night-coverage [email or web address] Password [facility] 05/15/2016, 6:55 AM

## 2016-05-15 NOTE — Progress Notes (Signed)
DAILY PROGRESS NOTE  Subjective:  On IV heparin. GI evaluation with Dr. Fuller Plan - hemoccult positive. No chest pain.  Objective:  Temp:  [98.1 F (36.7 C)-98.6 F (37 C)] 98.6 F (37 C) (02/04 1218) Pulse Rate:  [80-88] 80 (02/04 1218) Resp:  [18-20] 20 (02/04 1218) BP: (102-117)/(61-76) 102/61 (02/04 1218) SpO2:  [96 %-97 %] 97 % (02/04 1218) Weight:  [182 lb 11.2 oz (82.9 kg)] 182 lb 11.2 oz (82.9 kg) (02/04 0410) Weight change: -4.8 oz (-0.136 kg)  Intake/Output from previous day: 02/03 0701 - 02/04 0700 In: 788.7 [P.O.:600; I.V.:188.7] Out: 800 [Urine:800]  Intake/Output from this shift: Total I/O In: 240 [P.O.:240] Out: -   Medications: No current facility-administered medications on file prior to encounter.    Current Outpatient Prescriptions on File Prior to Encounter  Medication Sig Dispense Refill  . apixaban (ELIQUIS) 5 MG TABS tablet Take 1 tablet (5 mg total) by mouth 2 (two) times daily. (Patient taking differently: Take 5 mg by mouth daily. ) 60 tablet 0  . atorvastatin (LIPITOR) 20 MG tablet Take 1 tablet (20 mg total) by mouth at bedtime. Can switch to a cheaper statin if needed 30 tablet 0  . folic acid (FOLVITE) 696 MCG tablet Take 800 mcg by mouth daily.    . metFORMIN (GLUCOPHAGE) 1000 MG tablet Take 1 tablet (1,000 mg total) by mouth 2 (two) times daily with a meal. 60 tablet 0  . omeprazole (PRILOSEC) 20 MG capsule Take 20 mg by mouth daily.    . ranitidine (ZANTAC) 300 MG tablet Take 300 mg by mouth at bedtime.    [name] apixaban (ELIQUIS) 5 MG TABS tablet Take 2 tablets (10 mg total) by mouth 2 (two) times daily. 15 tablet 0  . glucose monitoring kit (FREESTYLE) monitoring kit 1 each by Does not apply route 4 (four) times daily - after meals and at bedtime. 1 month Diabetic Testing Supplies for QAC-QHS accuchecks.  Diagnosis E11.65. Any brand OK 1 each 1  . traMADol (ULTRAM) 50 MG tablet Take 1 tablet (50 mg total) by mouth every 6 (six) hours as  needed. (Patient not taking: Reported on 05/11/2016) 60 tablet 1    Physical Exam: General appearance: alert and no distress Neck: no carotid bruit and no JVD Lungs: clear to auscultation bilaterally Heart: regular rate and rhythm, S1, S2 normal, no murmur, click, rub or gallop Abdomen: soft, non-tender; bowel sounds normal; no masses,  no organomegaly Extremities: extremities normal, atraumatic, no cyanosis or edema Pulses: 2+ and symmetric Skin: Skin color, texture, turgor normal. No rashes or lesions Neurologic: Grossly normal Psych: Pleasant  Lab Results: Results for orders placed or performed during the hospital encounter of 05/11/16 (from the past 48 hour(s))  Troponin I (q 6hr x 3)     Status: Abnormal   Collection Time: 05/13/16  1:49 PM  Result Value Ref Range   Troponin I 0.09 (HH) <0.03 ng/mL    Comment: CRITICAL VALUE NOTED.  VALUE IS CONSISTENT WITH PREVIOUSLY REPORTED AND CALLED VALUE.  Vitamin B12     Status: None   Collection Time: 05/13/16  1:49 PM  Result Value Ref Range   Vitamin B-12 478 180 - 914 pg/mL    Comment: (NOTE) This assay is not validated for testing neonatal or myeloproliferative syndrome specimens for Vitamin B12 levels.   Folate     Status: None   Collection Time: 05/13/16  1:49 PM  Result Value Ref Range   Folate 21.3 >5.9  ng/mL  Iron and TIBC     Status: Abnormal   Collection Time: 05/13/16  1:49 PM  Result Value Ref Range   Iron 22 (L) 28 - 170 ug/dL   TIBC 185 (L) 250 - 450 ug/dL   Saturation Ratios 12 10.4 - 31.8 %   UIBC 163 ug/dL  Ferritin     Status: Abnormal   Collection Time: 05/13/16  1:49 PM  Result Value Ref Range   Ferritin 770 (H) 11 - 307 ng/mL  Reticulocytes     Status: Abnormal   Collection Time: 05/13/16  1:49 PM  Result Value Ref Range   Retic Ct Pct 4.8 (H) 0.4 - 3.1 %   RBC. 3.09 (L) 3.87 - 5.11 MIL/uL   Retic Count, Manual 148.3 19.0 - 186.0 K/uL  Glucose, capillary     Status: Abnormal   Collection Time:  05/13/16  4:34 PM  Result Value Ref Range   Glucose-Capillary 151 (H) 65 - 99 mg/dL   Comment 1 Notify RN   Troponin I (q 6hr x 3)     Status: Abnormal   Collection Time: 05/13/16  8:40 PM  Result Value Ref Range   Troponin I 0.10 (HH) <0.03 ng/mL    Comment: CRITICAL VALUE NOTED.  VALUE IS CONSISTENT WITH PREVIOUSLY REPORTED AND CALLED VALUE.  Glucose, capillary     Status: Abnormal   Collection Time: 05/13/16  9:30 PM  Result Value Ref Range   Glucose-Capillary 155 (H) 65 - 99 mg/dL   Comment 1 Notify RN    Comment 2 Document in Chart   APTT     Status: Abnormal   Collection Time: 05/14/16  3:53 AM  Result Value Ref Range   aPTT 125 (H) 24 - 36 seconds    Comment:        IF BASELINE aPTT IS ELEVATED, SUGGEST PATIENT RISK ASSESSMENT BE USED TO DETERMINE APPROPRIATE ANTICOAGULANT THERAPY.   Heparin level (unfractionated)     Status: Abnormal   Collection Time: 05/14/16  3:53 AM  Result Value Ref Range   Heparin Unfractionated 1.82 (H) 0.30 - 0.70 IU/mL    Comment: RESULTS CONFIRMED BY MANUAL DILUTION        IF HEPARIN RESULTS ARE BELOW EXPECTED VALUES, AND PATIENT DOSAGE HAS BEEN CONFIRMED, SUGGEST FOLLOW UP TESTING OF ANTITHROMBIN III LEVELS.   CBC     Status: Abnormal   Collection Time: 05/14/16  3:53 AM  Result Value Ref Range   WBC 7.7 4.0 - 10.5 K/uL   RBC 2.68 (L) 3.87 - 5.11 MIL/uL   Hemoglobin 8.2 (L) 12.0 - 15.0 g/dL   HCT 25.7 (L) 36.0 - 46.0 %   MCV 95.9 78.0 - 100.0 fL   MCH 30.6 26.0 - 34.0 pg   MCHC 31.9 30.0 - 36.0 g/dL   RDW 17.5 (H) 11.5 - 15.5 %   Platelets 327 150 - 400 K/uL  Basic metabolic panel     Status: Abnormal   Collection Time: 05/14/16  3:53 AM  Result Value Ref Range   Sodium 136 135 - 145 mmol/L   Potassium 4.0 3.5 - 5.1 mmol/L   Chloride 105 101 - 111 mmol/L   CO2 22 22 - 32 mmol/L   Glucose, Bld 158 (H) 65 - 99 mg/dL   BUN 10 6 - 20 mg/dL   Creatinine, Ser 0.87 0.44 - 1.00 mg/dL   Calcium 9.0 8.9 - 10.3 mg/dL   GFR calc non  Af Amer >60 >60 mL/min  GFR calc Af Amer >60 >60 mL/min    Comment: (NOTE) The eGFR has been calculated using the CKD EPI equation. This calculation has not been validated in all clinical situations. eGFR's persistently <60 mL/min signify possible Chronic Kidney Disease.    Anion gap 9 5 - 15  Glucose, capillary     Status: Abnormal   Collection Time: 05/14/16  7:38 AM  Result Value Ref Range   Glucose-Capillary 133 (H) 65 - 99 mg/dL   Comment 1 Notify RN   Glucose, capillary     Status: Abnormal   Collection Time: 05/14/16 11:36 AM  Result Value Ref Range   Glucose-Capillary 168 (H) 65 - 99 mg/dL   Comment 1 Notify RN   Occult blood card to lab, stool     Status: Abnormal   Collection Time: 05/14/16 12:31 PM  Result Value Ref Range   Fecal Occult Bld POSITIVE (A) NEGATIVE  APTT     Status: Abnormal   Collection Time: 05/14/16  1:50 PM  Result Value Ref Range   aPTT 102 (H) 24 - 36 seconds    Comment:        IF BASELINE aPTT IS ELEVATED, SUGGEST PATIENT RISK ASSESSMENT BE USED TO DETERMINE APPROPRIATE ANTICOAGULANT THERAPY.   Glucose, capillary     Status: Abnormal   Collection Time: 05/14/16  4:36 PM  Result Value Ref Range   Glucose-Capillary 123 (H) 65 - 99 mg/dL   Comment 1 Notify RN   Heparin level (unfractionated)     Status: Abnormal   Collection Time: 05/14/16  9:05 PM  Result Value Ref Range   Heparin Unfractionated 1.11 (H) 0.30 - 0.70 IU/mL    Comment: RESULTS CONFIRMED BY MANUAL DILUTION        IF HEPARIN RESULTS ARE BELOW EXPECTED VALUES, AND PATIENT DOSAGE HAS BEEN CONFIRMED, SUGGEST FOLLOW UP TESTING OF ANTITHROMBIN III LEVELS.   APTT     Status: Abnormal   Collection Time: 05/14/16  9:05 PM  Result Value Ref Range   aPTT 90 (H) 24 - 36 seconds    Comment:        IF BASELINE aPTT IS ELEVATED, SUGGEST PATIENT RISK ASSESSMENT BE USED TO DETERMINE APPROPRIATE ANTICOAGULANT THERAPY.   Glucose, capillary     Status: Abnormal   Collection Time:  05/14/16  9:34 PM  Result Value Ref Range   Glucose-Capillary 161 (H) 65 - 99 mg/dL  APTT     Status: Abnormal   Collection Time: 05/15/16  4:18 AM  Result Value Ref Range   aPTT 115 (H) 24 - 36 seconds    Comment:        IF BASELINE aPTT IS ELEVATED, SUGGEST PATIENT RISK ASSESSMENT BE USED TO DETERMINE APPROPRIATE ANTICOAGULANT THERAPY.   Basic metabolic panel     Status: Abnormal   Collection Time: 05/15/16  4:18 AM  Result Value Ref Range   Sodium 136 135 - 145 mmol/L   Potassium 3.6 3.5 - 5.1 mmol/L   Chloride 105 101 - 111 mmol/L   CO2 23 22 - 32 mmol/L   Glucose, Bld 159 (H) 65 - 99 mg/dL   BUN 13 6 - 20 mg/dL   Creatinine, Ser 0.90 0.44 - 1.00 mg/dL   Calcium 9.2 8.9 - 10.3 mg/dL   GFR calc non Af Amer >60 >60 mL/min   GFR calc Af Amer >60 >60 mL/min    Comment: (NOTE) The eGFR has been calculated using the CKD EPI equation. This calculation has  not been validated in all clinical situations. eGFR's persistently <60 mL/min signify possible Chronic Kidney Disease.    Anion gap 8 5 - 15  Magnesium     Status: Abnormal   Collection Time: 05/15/16  4:18 AM  Result Value Ref Range   Magnesium 1.5 (L) 1.7 - 2.4 mg/dL  CBC     Status: Abnormal   Collection Time: 05/15/16  4:18 AM  Result Value Ref Range   WBC 7.8 4.0 - 10.5 K/uL   RBC 2.79 (L) 3.87 - 5.11 MIL/uL   Hemoglobin 8.4 (L) 12.0 - 15.0 g/dL   HCT 26.6 (L) 36.0 - 46.0 %   MCV 95.3 78.0 - 100.0 fL   MCH 30.1 26.0 - 34.0 pg   MCHC 31.6 30.0 - 36.0 g/dL   RDW 16.9 (H) 11.5 - 15.5 %   Platelets 383 150 - 400 K/uL  Heparin level (unfractionated)     Status: Abnormal   Collection Time: 05/15/16  4:18 AM  Result Value Ref Range   Heparin Unfractionated 0.80 (H) 0.30 - 0.70 IU/mL    Comment:        IF HEPARIN RESULTS ARE BELOW EXPECTED VALUES, AND PATIENT DOSAGE HAS BEEN CONFIRMED, SUGGEST FOLLOW UP TESTING OF ANTITHROMBIN III LEVELS.   Glucose, capillary     Status: Abnormal   Collection Time: 05/15/16   7:43 AM  Result Value Ref Range   Glucose-Capillary 141 (H) 65 - 99 mg/dL   Comment 1 Notify RN   Glucose, capillary     Status: Abnormal   Collection Time: 05/15/16 11:35 AM  Result Value Ref Range   Glucose-Capillary 300 (H) 65 - 99 mg/dL   Comment 1 Notify RN     Imaging: No results found.  Assessment:  Principal Problem:   Hypoxia Active Problems:   Type 2 diabetes mellitus without complication, without long-term current use of insulin (HCC)   Acute saddle pulmonary embolism with acute cor pulmonale (HCC)   Anemia   Abnormal echocardiogram   Elevated troponin   Abnormal CT scan, liver   Occult blood in stools   Uncontrolled type 2 diabetes mellitus with complication (HCC)   Acute systolic CHF (congestive heart failure) ([place])   Pulmonary hypertension   Primary cancer of left breast with stage 2 nodal metastasis per American Joint Committee on Cancer 7th edition (N2) ([place])   Plan: 1.  No chest pain. HR improved in the 80's. Hemoccult positive. GI evaluated.  Will hold on cath until GI evaluation complete - as she may be found to have CAD and intervention would require starting antiplatelet agent - D/W Dr. Fuller Plan.  Consider adding ARB or Entresto after cath. No antiplatelet for now given anemia and FOBT +. Appears euvolemic on exam - not on diuretic.  Time Spent Directly with Patient:  15 minutes  Length of Stay:  LOS: 3 days   Pixie Casino, MD, [facility] Attending Cardiologist [place] 05/15/2016, 12:34 PM

## 2016-05-15 NOTE — Progress Notes (Signed)
[facility] Progress Note  Chief Complaint:   Anemia ,FOBT + stool  Subjective: feels fine, just completed lunch.   Objective:  Vital signs in last 24 hours: Temp:  [98.1 F (36.7 C)-98.6 F (37 C)] 98.6 F (37 C) (02/04 1218) Pulse Rate:  [80-88] 80 (02/04 1218) Resp:  [18-20] 20 (02/04 1218) BP: (102-117)/(61-76) 102/61 (02/04 1218) SpO2:  [96 %-97 %] 97 % (02/04 1218) Weight:  [182 lb 11.2 oz (82.9 kg)] 182 lb 11.2 oz (82.9 kg) (02/04 0410) Last BM Date: 05/14/16 General:   Alert, well-developed, female  in NAD EENT:  Normal hearing, non icteric sclera, conjunctive pink.  Heart:  Regular rate and rhythm Pulm: Normal respiratory effort, bibasilar crackles Abdomen:  Soft, nondistended, nontender.  Normal bowel sounds, Neurologic:  Alert and  oriented x4;  grossly normal neurologically. Psych:  Alert and cooperative. Normal mood and affect.   Intake/Output from previous day: 02/03 0701 - 02/04 0700 In: 788.7 [P.O.:600; I.V.:188.7] Out: 800 [Urine:800] Intake/Output this shift: Total I/O In: 240 [P.O.:240] Out: -   Lab Results:  Recent Labs  05/13/16 0428 05/14/16 0353 05/15/16 0418  WBC 8.5 7.7 7.8  HGB 8.6* 8.2* 8.4*  HCT 26.8* 25.7* 26.6*  PLT 314 327 383   BMET  Recent Labs  05/13/16 0428 05/14/16 0353 05/15/16 0418  NA 135 136 136  K 4.1 4.0 3.6  CL 102 105 105  CO2 21* 22 23  GLUCOSE 92 158* 159*  BUN 10 10 13   CREATININE 0.86 0.87 0.90  CALCIUM 9.1 9.0 9.2     Assessment / Plan:  1. 66 yo female with [place] anemia / heme + stools on Eliquis which she takes for DVT/PE  -Suspect anemia of chronic disease but will need EGD / colonoscopy following cardiac cath unless Cardiology prefers they be done beforehand. Right now she is scheduled for a heart cath to be done tomorrow. She understands the plan -Eliquis on hold, getting IV heparin -hgb low at 8.4 but stable.   ADDENDUM: Cardiology called and would like endoscopic workup  prior to cath. Will plan for EGD / colonoscopy tomorrow. Will find out what time and hold heparin 4 hours before.   2. Cardiomyopathy, for heart cath tomorrow.  3. ? Cirrhosis by CTscan.  No evidence for decompensation -viral hepatitis studies pending.   4. Breast cancer, followed by Dr. Jana Hakim.On Arimidex   Principal Problem:   Hypoxia Active Problems:   Type 2 diabetes mellitus without complication, without long-term current use of insulin (HCC)   Acute saddle pulmonary embolism with acute cor pulmonale (HCC)   Anemia   Abnormal echocardiogram   Elevated troponin   Abnormal CT scan, liver   Occult blood in stools   Uncontrolled type 2 diabetes mellitus with complication (HCC)   Acute systolic CHF (congestive heart failure) ([place])   Pulmonary hypertension   Primary cancer of left breast with stage 2 nodal metastasis per American Joint Committee on Cancer 7th edition (N2) ([place])    LOS: 3 days   Tye Savoy NP 05/15/2016, 12:40 PM  Pager number [phone]    Attending physician's note   I have taken an interval history, reviewed the chart and examined the patient. I agree with the Advanced Practitioner's note, impression and recommendations.  Anemia and heme + stool. Mgmt discussed with Dr. Debara Pickett who requests GI evaluation prior to cath. Schedule colonoscopy/EGD with Dr.Jacobs on Monday off heparin 4-6 hours.  Saddle PE and DVT 2 weeks ago,  now Eliquis on hold and receiving IV heparin Abnormal liver contour on CT - query cirrhosis. Viral serologies pending. Cardiomyopathy with LV EF 40-45%.     Lucio Edward, MD [facility] [phone] Mon-Fri 8a-5p [phone] after 5p, weekends, holidays

## 2016-05-16 ENCOUNTER — Inpatient Hospital Stay (HOSPITAL_COMMUNITY): Payer: Medicare Other | Admitting: Certified Registered Nurse Anesthetist

## 2016-05-16 ENCOUNTER — Encounter (HOSPITAL_COMMUNITY): Payer: Self-pay | Admitting: Certified Registered Nurse Anesthetist

## 2016-05-16 ENCOUNTER — Encounter (HOSPITAL_COMMUNITY)
Admission: EM | Disposition: A | Discharge status: Home / Self Care | Payer: Self-pay | Source: Home / Self Care | Attending: Internal Medicine

## 2016-05-16 DIAGNOSIS — R931 Abnormal findings on diagnostic imaging of heart and coronary circulation: Secondary | ICD-10-CM | POA: Diagnosis not present

## 2016-05-16 DIAGNOSIS — D122 Benign neoplasm of ascending colon: Secondary | ICD-10-CM | POA: Diagnosis not present

## 2016-05-16 DIAGNOSIS — R195 Other fecal abnormalities: Secondary | ICD-10-CM | POA: Diagnosis not present

## 2016-05-16 DIAGNOSIS — R0902 Hypoxemia: Secondary | ICD-10-CM

## 2016-05-16 DIAGNOSIS — I5023 Acute on chronic systolic (congestive) heart failure: Secondary | ICD-10-CM | POA: Diagnosis not present

## 2016-05-16 DIAGNOSIS — I248 Other forms of acute ischemic heart disease: Secondary | ICD-10-CM | POA: Diagnosis not present

## 2016-05-16 DIAGNOSIS — C50912 Malignant neoplasm of unspecified site of left female breast: Secondary | ICD-10-CM | POA: Diagnosis not present

## 2016-05-16 DIAGNOSIS — I110 Hypertensive heart disease with heart failure: Secondary | ICD-10-CM | POA: Diagnosis not present

## 2016-05-16 DIAGNOSIS — D649 Anemia, unspecified: Secondary | ICD-10-CM | POA: Diagnosis not present

## 2016-05-16 DIAGNOSIS — I272 Pulmonary hypertension, unspecified: Secondary | ICD-10-CM | POA: Diagnosis not present

## 2016-05-16 DIAGNOSIS — J9601 Acute respiratory failure with hypoxia: Secondary | ICD-10-CM | POA: Diagnosis not present

## 2016-05-16 DIAGNOSIS — K5730 Diverticulosis of large intestine without perforation or abscess without bleeding: Secondary | ICD-10-CM | POA: Diagnosis not present

## 2016-05-16 DIAGNOSIS — E1165 Type 2 diabetes mellitus with hyperglycemia: Secondary | ICD-10-CM | POA: Diagnosis not present

## 2016-05-16 DIAGNOSIS — Z800 Family history of malignant neoplasm of digestive organs: Secondary | ICD-10-CM | POA: Diagnosis not present

## 2016-05-16 DIAGNOSIS — D638 Anemia in other chronic diseases classified elsewhere: Secondary | ICD-10-CM | POA: Diagnosis not present

## 2016-05-16 DIAGNOSIS — I429 Cardiomyopathy, unspecified: Secondary | ICD-10-CM | POA: Diagnosis not present

## 2016-05-16 DIAGNOSIS — R079 Chest pain, unspecified: Secondary | ICD-10-CM | POA: Diagnosis not present

## 2016-05-16 DIAGNOSIS — R748 Abnormal levels of other serum enzymes: Secondary | ICD-10-CM | POA: Diagnosis not present

## 2016-05-16 DIAGNOSIS — I2602 Saddle embolus of pulmonary artery with acute cor pulmonale: Secondary | ICD-10-CM | POA: Diagnosis not present

## 2016-05-16 HISTORY — PX: COLONOSCOPY: SHX5424

## 2016-05-16 HISTORY — PX: ESOPHAGOGASTRODUODENOSCOPY: SHX5428

## 2016-05-16 LAB — BASIC METABOLIC PANEL
Anion gap: 11 (ref 5–15)
BUN: 9 mg/dL (ref 6–20)
CO2: 19 mmol/L — AB (ref 22–32)
Calcium: 9.3 mg/dL (ref 8.9–10.3)
Chloride: 107 mmol/L (ref 101–111)
Creatinine, Ser: 0.8 mg/dL (ref 0.44–1.00)
GFR calc Af Amer: >60 mL/min (ref >60)
GFR calc non Af Amer: >60 mL/min (ref >60)
GLUCOSE: 113 mg/dL — AB (ref 65–99)
POTASSIUM: 3.6 mmol/L (ref 3.5–5.1)
Sodium: 137 mmol/L (ref 135–145)

## 2016-05-16 LAB — CBC
HEMATOCRIT: 24.5 % — AB (ref 36.0–46.0)
HEMOGLOBIN: 8.1 g/dL — AB (ref 12.0–15.0)
MCH: 30.9 pg (ref 26.0–34.0)
MCHC: 33.1 g/dL (ref 30.0–36.0)
MCV: 93.5 fL (ref 78.0–100.0)
Platelets: UNDETERMINED 10*3/uL (ref 150–400)
RBC: 2.62 MIL/uL — ABNORMAL LOW (ref 3.87–5.11)
RDW: 16.7 % — ABNORMAL HIGH (ref 11.5–15.5)
WBC: 7.5 10*3/uL (ref 4.0–10.5)

## 2016-05-16 LAB — HEPARIN LEVEL (UNFRACTIONATED)
HEPARIN UNFRACTIONATED: 0.23 [IU]/mL — AB (ref 0.30–0.70)
HEPARIN UNFRACTIONATED: 0.13 [IU]/mL — AB (ref 0.30–0.70)

## 2016-05-16 LAB — TYPE AND SCREEN
ABO/RH(D): B POS
Antibody Screen: POSITIVE
DAT, IGG: NEGATIVE
PT AG TYPE: NEGATIVE

## 2016-05-16 LAB — GLUCOSE, CAPILLARY
GLUCOSE-CAPILLARY: 120 mg/dL — AB (ref 65–99)
Glucose-Capillary: 134 mg/dL — ABNORMAL HIGH (ref 65–99)
Glucose-Capillary: 122 mg/dL — ABNORMAL HIGH (ref 65–99)
Glucose-Capillary: 157 mg/dL — ABNORMAL HIGH (ref 65–99)

## 2016-05-16 LAB — MAGNESIUM: Magnesium: 1.4 mg/dL — ABNORMAL LOW (ref 1.7–2.4)

## 2016-05-16 SURGERY — COLONOSCOPY
Anesthesia: Monitor Anesthesia Care

## 2016-05-16 MED ORDER — PROPOFOL 500 MG/50ML IV EMUL
INTRAVENOUS | Status: DC | PRN
  Administered 2016-05-16: 75 ug/kg/min via INTRAVENOUS

## 2016-05-16 MED ORDER — PROPOFOL 10 MG/ML IV BOLUS
INTRAVENOUS | Status: DC | PRN
  Administered 2016-05-16 (×3): 10 mg via INTRAVENOUS

## 2016-05-16 MED ORDER — LACTATED RINGERS IV SOLN
INTRAVENOUS | Status: DC | PRN
  Administered 2016-05-16: 09:00:00 via INTRAVENOUS

## 2016-05-16 MED ORDER — PHENYLEPHRINE 40 MCG/ML (10ML) SYRINGE FOR IV PUSH (FOR BLOOD PRESSURE SUPPORT)
PREFILLED_SYRINGE | INTRAVENOUS | Status: DC | PRN
  Administered 2016-05-16: 80 ug via INTRAVENOUS
  Administered 2016-05-16: 40 ug via INTRAVENOUS
  Administered 2016-05-16: 120 ug via INTRAVENOUS

## 2016-05-16 MED ORDER — SODIUM CHLORIDE 0.9 % IV SOLN: Freq: Once | INTRAVENOUS | Status: DC

## 2016-05-16 MED ORDER — BUTAMBEN-TETRACAINE-BENZOCAINE 2-2-14 % EX AERO
INHALATION_SPRAY | CUTANEOUS | Status: DC | PRN
  Administered 2016-05-16: 2 via TOPICAL

## 2016-05-16 MED ORDER — HEPARIN (PORCINE) IN NACL 100-0.45 UNIT/ML-% IJ SOLN
900.0000 [IU]/h | INTRAMUSCULAR | Status: DC
  Administered 2016-05-16 – 2016-05-17 (×2): 800 [IU]/h via INTRAVENOUS
  Administered 2016-05-18: 900 [IU]/h via INTRAVENOUS
  Filled 2016-05-16 (×2): qty 250

## 2016-05-16 NOTE — Transfer of Care (Signed)
Immediate Anesthesia Transfer of Care Note  Patient: Corry Storie  Procedure(s) Performed: Procedure(s): COLONOSCOPY (N/A) ESOPHAGOGASTRODUODENOSCOPY (EGD) (N/A)  Patient Location: Endoscopy Unit  Anesthesia Type:MAC  Level of Consciousness: awake, alert  and oriented  Airway & Oxygen Therapy: Patient Spontanous Breathing and Patient connected to nasal cannula oxygen  Post-op Assessment: Report given to RN, Post -op Vital signs reviewed and stable and Patient moving all extremities X 4  Post vital signs: Reviewed and stable  Last Vitals:  Vitals:   05/16/16 0437 05/16/16 0717  BP: 138/80 134/89  Pulse: 86 82  Resp: 18 10  Temp: 36.8 C 36.8 C    Last Pain:  Vitals:   05/16/16 0717  TempSrc: Oral  PainSc:       Patients Stated Pain Goal: 2 (04/88/89 1694)  Complications: No apparent anesthesia complications

## 2016-05-16 NOTE — Op Note (Signed)
[facility] Patient Name: Carrie Watts Procedure Date : 05/16/2016 MRN: [id number] Attending MD: [name] , MD Date of Birth: Sep 03, 1950 CSN: [id number] Age: 66 Admit Type: Inpatient Procedure:                Colonoscopy Indications:              Heme positive stool, anemia; in setting of new                            blood thinner for PE; FH of colon cancer (father) Providers:                [name], MD, Carolynn Comment, RN, Cherylynn Ridges, Technician, Garrison [place], CRNA Referring MD:             Lucio Edward, MD Medicines:                Monitored Anesthesia Care Complications:            No immediate complications. Estimated blood loss:                            None. Estimated Blood Loss:     Estimated blood loss: none. Procedure:                Pre-Anesthesia Assessment:                           - Prior to the procedure, a History and Physical                            was performed, and patient medications and                            allergies were reviewed. The patient's tolerance of                            previous anesthesia was also reviewed. The risks                            and benefits of the procedure and the sedation                            options and risks were discussed with the patient.                            All questions were answered, and informed consent                            was obtained. Prior Anticoagulants: The patient has                            taken heparin, last dose was 1 day prior to  procedure. ASA Grade Assessment: III - A patient                            with severe systemic disease. After reviewing the                            risks and benefits, the patient was deemed in                            satisfactory condition to undergo the procedure.                           After obtaining informed consent, the colonoscope              was passed under direct vision. Throughout the                            procedure, the patient's blood pressure, pulse, and                            oxygen saturations were monitored continuously. The                            [id number] scope was introduced through                            the anus and advanced to the the cecum, identified                            by appendiceal orifice and ileocecal valve. The                            colonoscopy was performed without difficulty. The                            patient tolerated the procedure well. The quality                            of the bowel preparation was excellent. The                            ileocecal valve, appendiceal orifice, and rectum                            were photographed. Scope In: 8:47:21 AM Scope Out: 9:00:11 AM Scope Withdrawal Time: 0 hours 8 minutes 57 seconds  Total Procedure Duration: 0 hours 12 minutes 50 seconds  Findings:      A 3 mm polyp was found in the ascending colon. The polyp was sessile.       The polyp was removed with a cold snare. Resection and retrieval were       complete.      Multiple small and large-mouthed diverticula were found in the left       colon.      The exam was otherwise without  abnormality on direct and retroflexion       views. Impression:               - One 3 mm polyp in the ascending colon, removed                            with a cold snare. Resected and retrieved.                           - Diverticulosis in the left colon.                           - The examination was otherwise normal on direct                            and retroflexion views. Moderate Sedation:      none Recommendation:           - Patient has a contact number available for                            emergencies. The signs and symptoms of potential                            delayed complications were discussed with the                            patient.  Return to normal activities tomorrow.                            Written discharge instructions were provided to the                            patient.                           - Resume previous diet (after angiogram today)                           - Continue present medications.                           You will receive a letter within 2-3 weeks with the                            pathology results and my final recommendations.                           If the polyp(s) is proven to be 'pre-cancerous' on                            pathology, you will need repeat colonoscopy in 5                            years with Dr. Fuller Plan. Procedure Code(s):        ---  Professional ---                           [phone], Colonoscopy, flexible; with removal of                            tumor(s), polyp(s), or other lesion(s) by snare                            technique Diagnosis Code(s):        --- Professional ---                           D12.2, Benign neoplasm of ascending colon                           R19.5, Other fecal abnormalities                           K57.30, Diverticulosis of large intestine without                            perforation or abscess without bleeding CPT copyright 2016 American Medical Association. All rights reserved. The codes documented in this report are preliminary and upon coder review may  be revised to meet current compliance requirements. [name], MD 05/16/2016 9:08:39 AM This report has been signed electronically. Number of Addenda: 0

## 2016-05-16 NOTE — H&P (View-Only) (Signed)
[facility] Progress Note  Chief Complaint:   Anemia ,FOBT + stool  Subjective: feels fine, just completed lunch.   Objective:  Vital signs in last 24 hours: Temp:  [98.1 F (36.7 C)-98.6 F (37 C)] 98.6 F (37 C) (02/04 1218) Pulse Rate:  [80-88] 80 (02/04 1218) Resp:  [18-20] 20 (02/04 1218) BP: (102-117)/(61-76) 102/61 (02/04 1218) SpO2:  [96 %-97 %] 97 % (02/04 1218) Weight:  [182 lb 11.2 oz (82.9 kg)] 182 lb 11.2 oz (82.9 kg) (02/04 0410) Last BM Date: 05/14/16 General:   Alert, well-developed, female  in NAD EENT:  Normal hearing, non icteric sclera, conjunctive pink.  Heart:  Regular rate and rhythm Pulm: Normal respiratory effort, bibasilar crackles Abdomen:  Soft, nondistended, nontender.  Normal bowel sounds, Neurologic:  Alert and  oriented x4;  grossly normal neurologically. Psych:  Alert and cooperative. Normal mood and affect.   Intake/Output from previous day: 02/03 0701 - 02/04 0700 In: 788.7 [P.O.:600; I.V.:188.7] Out: 800 [Urine:800] Intake/Output this shift: Total I/O In: 240 [P.O.:240] Out: -   Lab Results:  Recent Labs  05/13/16 0428 05/14/16 0353 05/15/16 0418  WBC 8.5 7.7 7.8  HGB 8.6* 8.2* 8.4*  HCT 26.8* 25.7* 26.6*  PLT 314 327 383   BMET  Recent Labs  05/13/16 0428 05/14/16 0353 05/15/16 0418  NA 135 136 136  K 4.1 4.0 3.6  CL 102 105 105  CO2 21* 22 23  GLUCOSE 92 158* 159*  BUN 10 10 13   CREATININE 0.86 0.87 0.90  CALCIUM 9.1 9.0 9.2     Assessment / Plan:  1. 66 yo female with [place] anemia / heme + stools on Eliquis which she takes for DVT/PE  -Suspect anemia of chronic disease but will need EGD / colonoscopy following cardiac cath unless Cardiology prefers they be done beforehand. Right now she is scheduled for a heart cath to be done tomorrow. She understands the plan -Eliquis on hold, getting IV heparin -hgb low at 8.4 but stable.   ADDENDUM: Cardiology called and would like endoscopic workup  prior to cath. Will plan for EGD / colonoscopy tomorrow. Will find out what time and hold heparin 4 hours before.   2. Cardiomyopathy, for heart cath tomorrow.  3. ? Cirrhosis by CTscan.  No evidence for decompensation -viral hepatitis studies pending.   4. Breast cancer, followed by Dr. Jana Hakim.On Arimidex   Principal Problem:   Hypoxia Active Problems:   Type 2 diabetes mellitus without complication, without long-term current use of insulin (HCC)   Acute saddle pulmonary embolism with acute cor pulmonale (HCC)   Anemia   Abnormal echocardiogram   Elevated troponin   Abnormal CT scan, liver   Occult blood in stools   Uncontrolled type 2 diabetes mellitus with complication (HCC)   Acute systolic CHF (congestive heart failure) ([place])   Pulmonary hypertension   Primary cancer of left breast with stage 2 nodal metastasis per American Joint Committee on Cancer 7th edition (N2) ([place])    LOS: 3 days   Tye Savoy NP 05/15/2016, 12:40 PM  Pager number [phone]    Attending physician's note   I have taken an interval history, reviewed the chart and examined the patient. I agree with the Advanced Practitioner's note, impression and recommendations.  Anemia and heme + stool. Mgmt discussed with Dr. Debara Pickett who requests GI evaluation prior to cath. Schedule colonoscopy/EGD with Dr.Jacobs on Monday off heparin 4-6 hours.  Saddle PE and DVT 2 weeks ago,  now Eliquis on hold and receiving IV heparin Abnormal liver contour on CT - query cirrhosis. Viral serologies pending. Cardiomyopathy with LV EF 40-45%.     Lucio Edward, MD [facility] [phone] Mon-Fri 8a-5p [phone] after 5p, weekends, holidays

## 2016-05-16 NOTE — Op Note (Addendum)
[facility] Patient Name: Carrie Watts Procedure Date : 05/16/2016 MRN: [id number] Attending MD: [name] , MD Date of Birth: [birth year] CSN: [id number] Age: 66 Admit Type: Inpatient Procedure:                Upper GI endoscopy Indications:              Heme positive stool, anemia in setting of recent PE                            and blood thinners Providers:                [name], MD, Carolynn Comment, RN, Cherylynn Ridges, Technician, Garrison Columbus, CRNA Referring MD:             Lucio Edward, MD Medicines:                Monitored Anesthesia Care Complications:            No immediate complications. Estimated blood loss:                            None. Estimated Blood Loss:     Estimated blood loss: none. Procedure:                Pre-Anesthesia Assessment:                           - Prior to the procedure, a History and Physical                            was performed, and patient medications and                            allergies were reviewed. The patient's tolerance of                            previous anesthesia was also reviewed. The risks                            and benefits of the procedure and the sedation                            options and risks were discussed with the patient.                            All questions were answered, and informed consent                            was obtained. Prior Anticoagulants: The patient has                            taken heparin, last dose was 1 day prior to  procedure. ASA Grade Assessment: III - A patient                            with severe systemic disease. After reviewing the                            risks and benefits, the patient was deemed in                            satisfactory condition to undergo the procedure.                           After obtaining informed consent, the endoscope was   passed under direct vision. Throughout the                            procedure, the patient's blood pressure, pulse, and                            oxygen saturations were monitored continuously. The                            Endoscope was introduced through the mouth, and                            advanced to the second part of duodenum. The upper                            GI endoscopy was accomplished without difficulty.                            The patient tolerated the procedure well. Scope In: Scope Out: Findings:      The esophagus was normal.      The stomach was normal.      The examined duodenum was normal. Impression:               - Normal esophagus.                           - Normal stomach.                           - Normal examined duodenum.                           - No specimens collected. Moderate Sedation:      none Recommendation:           - Return patient to hospital ward for ongoing care.                           - OK to proceed with cardiac workup. Please call if                            she has any overt GI bleeding.                           -  OK to resume her blood thinners today. Procedure Code(s):        --- Professional ---                           [phone], Esophagogastroduodenoscopy, flexible,                            transoral; diagnostic, including collection of                            specimen(s) by brushing or washing, when performed                            (separate procedure) Diagnosis Code(s):        --- Professional ---                           R19.5, Other fecal abnormalities CPT copyright 2016 American Medical Association. All rights reserved. The codes documented in this report are preliminary and upon coder review may  be revised to meet current compliance requirements. [name], MD 05/16/2016 9:11:07 AM This report has been signed electronically. Number of Addenda: 0

## 2016-05-16 NOTE — Progress Notes (Signed)
Patient back to unit after EGD in no distress.  Some mild nausea but patient feels this is d/t not having had anything to eat for so long and deferred nausea medication.  Hot tea and cereal given to patient.  Otherwise denies any pain or issue.  Will continue to monitor.

## 2016-05-16 NOTE — Progress Notes (Signed)
Patient with no complaints during 7 a to 7 p shift, rested most of the shift after having EGD/colonoscopy this morning.  Is anticipating cath tomorrow, we discussed that she is to remain NPO after midnight for this.  Awaiting further orders.  No complaints of chest pain or shortness of breath during this shift.

## 2016-05-16 NOTE — Anesthesia Preprocedure Evaluation (Addendum)
Anesthesia Evaluation  Patient identified by MRN, date of birth, ID band Patient awake    Reviewed: Allergy & Precautions, NPO status , Patient's Chart, lab work & pertinent test results  History of Anesthesia Complications Negative for: history of anesthetic complications  Airway Mallampati: III  TM Distance: >3 FB Neck ROM: Full    Dental  (+) Teeth Intact, Dental Advisory Given   Pulmonary shortness of breath and at rest, PE   breath sounds clear to auscultation       Cardiovascular hypertension, + angina + CAD, + Peripheral Vascular Disease and +CHF  + dysrhythmias Atrial Fibrillation  Rhythm:Regular     Neuro/Psych CVA negative psych ROS   GI/Hepatic Neg liver ROS,   Endo/Other  diabetes, Type 2, Oral Hypoglycemic Agents  Renal/GU Renal disease     Musculoskeletal   Abdominal   Peds  Hematology  (+) anemia ,   Anesthesia Other Findings   Reproductive/Obstetrics                            Anesthesia Physical Anesthesia Plan  ASA: IV  Anesthesia Plan: MAC   Post-op Pain Management:    Induction: Intravenous  Airway Management Planned: Natural Airway and Simple Face Mask  Additional Equipment: None  Intra-op Plan:   Post-operative Plan:   Informed Consent: I have reviewed the patients History and Physical, chart, labs and discussed the procedure including the risks, benefits and alternatives for the proposed anesthesia with the patient or authorized representative who has indicated his/her understanding and acceptance.   Dental advisory given  Plan Discussed with: CRNA, Anesthesiologist and Surgeon  Anesthesia Plan Comments:        Anesthesia Quick Evaluation

## 2016-05-16 NOTE — Progress Notes (Signed)
Progress Note  Patient Name: Carrie Watts Date of Encounter: 05/16/2016  Primary Cardiologist: Dr. Lilian Kapur  Subjective   Back from EGD and colonoscopy   Inpatient Medications    Scheduled Meds: . sodium chloride   Intravenous Once  . anastrozole  1 mg Oral Daily  . atorvastatin  20 mg Oral QHS  . carvedilol  12.5 mg Oral BID WC  . famotidine  10 mg Oral Daily  . insulin aspart  0-5 Units Subcutaneous QHS  . insulin aspart  0-9 Units Subcutaneous TID WC  . pantoprazole  40 mg Oral BID AC   Continuous Infusions:  PRN Meds: HYDROcodone-acetaminophen, HYDROcodone-homatropine, morphine injection, ondansetron (ZOFRAN) IV, traMADol   Vital Signs    Vitals:   05/16/16 0916 05/16/16 0920 05/16/16 0930 05/16/16 0940  BP: 123/81 137/80 (!) 122/102 119/67  Pulse: 84 83 73 73  Resp: (!) 29 (!) 27 20 20   Temp: 98 F (36.7 C)     TempSrc: Oral     SpO2: 100% 100% 99% 100%  Weight:      Height:        Intake/Output Summary (Last 24 hours) at 05/16/16 1027 Last data filed at 05/16/16 1002  Gross per 24 hour  Intake          1480.23 ml  Output              200 ml  Net          1280.23 ml   Filed Weights   05/14/16 0536 05/15/16 0410 05/16/16 0437  Weight: 183 lb (83 kg) 182 lb 11.2 oz (82.9 kg) 185 lb 8 oz (84.1 kg)    Telemetry    SR  - Personally Reviewed  ECG    No new - Personally Reviewed  Physical Exam   GEN: No acute distress.   Neck: No JVD, sitting on side of bed Cardiac: RRR, no murmurs, rubs, or gallops.  Respiratory: Clear to auscultation bilaterally. GI: Soft, nontender, non-distended  MS: No edema; No deformity. Neuro:  Nonfocal, MAE,  follows commands  Psych: Normal affect   Labs    Chemistry Recent Labs Lab 05/11/16 1507  05/14/16 0353 05/15/16 0418 05/16/16 0303  NA 137  < > 136 136 137  K 4.7  < > 4.0 3.6 3.6  CL  --   < > 105 105 107  CO2 22  < > 22 23 19*  GLUCOSE 114  < > 158* 159* 113*  BUN 11.6  < > 10 13 9     CREATININE 0.9  < > 0.87 0.90 0.80  CALCIUM 10.2  < > 9.0 9.2 9.3  PROT 7.4  --   --   --   --   ALBUMIN 2.8*  --   --   --   --   AST 32  --   --   --   --   ALT 24  --   --   --   --   ALKPHOS 101  --   --   --   --   BILITOT 0.50  --   --   --   --   GFRNONAA  --   < > >60 >60 >60  GFRAA  --   < > >60 >60 >60  ANIONGAP 11  < > 9 8 11   < > = values in this interval not displayed.   Hematology Recent Labs Lab 05/14/16 [id number] 05/15/16 [id number] 05/16/16 [id number]  WBC 7.7 7.8 7.5  RBC 2.68* 2.79* 2.62*  HGB 8.2* 8.4* 8.1*  HCT 25.7* 26.6* 24.5*  MCV 95.9 95.3 93.5  MCH 30.6 30.1 30.9  MCHC 31.9 31.6 33.1  RDW 17.5* 16.9* 16.7*  PLT 327 383 PLATELET CLUMPS NOTED ON SMEAR, UNABLE TO ESTIMATE    Cardiac Enzymes Recent Labs Lab 05/12/16 0454 05/13/16 0952 05/13/16 1349 05/13/16 2040  TROPONINI 0.10* 0.09* 0.09* 0.10*    Recent Labs Lab 05/11/16 1842  TROPIPOC 0.13*     BNPNo results for input(s): BNP, PROBNP in the last 168 hours.   DDimer No results for input(s): DDIMER in the last 168 hours.   Radiology    No results found.  Cardiac Studies   05/12/16  Study Conclusions  - Left ventricle: The cavity size was normal. Wall thickness was   increased in a pattern of mild LVH. Systolic function was mildly   to moderately reduced. The estimated ejection fraction was in the   range of 40% to 45%. Diffuse hypokinesis. Doppler parameters are   consistent with abnormal left ventricular relaxation (grade 1   diastolic dysfunction). - Mitral valve: There was mild regurgitation. - Pulmonary arteries: Systolic pressure was mildly increased. PA   peak pressure: 40 mm Hg (S).  Impressions:  - Mild to moderate global reduction in LV systolic function; grade   1 diastolic dysfunction; mild LVH; mild MR; trace TR with mildly   elevated pulmonary pressure.  Patient Profile     66 y.o. female with a history of DM, L breast cancer s/p lumpectomy and 5 rounds of chemo in  [place] and recent admission for PE and DVT at [facility] (04/24/16-05/03/16) presenting with sob, new Echo with decreased EF.  Assessment & Plan    Reduced LV function Echo this admission 05/12/16 showed 40-45% which reduced from 55-60% from 04/24/16. No RV dysfunction on recent echo however R sided heart strain on CTA of chest. Hx of angina but no cath or stress test in past.  Now GI eval complete, Dr. Lysbeth Penner note mentions cardiac cath once GI eval complete, + polypectomy today.   --on Heparin just restarting post EGD and colonoscopy ? Cardiac cath tomorrow  Dr. Acie Fredrickson to see  (pk troponin .13)  PE and DVT - reduced clot burden on CTA.  Eliquis stopped pt on heparin.    Signed, Cecilie Kicks, NP  05/16/2016, 10:27 AM    Attending Note:   The patient was seen and examined.  Agree with assessment and plan as noted above.  Changes made to the above note as needed.  Patient seen and independently examined with Cecilie Kicks, NP .   We discussed all aspects of the encounter. I agree with the assessment and plan as stated above.  1. CP / dyspnea / + troponin:   This may all be due to the pulmonary embolus / RV strain. I would go ahead with the cath since she is otherwise at low risk for the cath.  Continue heparin for now Restart Eliquis  Once she has had cath ( if OK with GI )    I have spent a total of 30 minutes with patient reviewing hospital  notes , telemetry, EKGs, labs and examining patient as well as establishing an assessment and plan that was discussed with the patient. > 50% of time was spent in direct patient care.    Thayer Headings, Brooke Bonito., MD, [facility] 05/16/2016, 12:40 PM [address]  [address] Office - [phone]  Pager [phone]

## 2016-05-16 NOTE — Progress Notes (Signed)
Patient states nausea relieved after eating.

## 2016-05-16 NOTE — Progress Notes (Signed)
PROGRESS NOTE    Carrie Watts  [id number] DOB: [birth year] DOA: 05/11/2016 PCP: No PCP Per Patient     Brief Narrative:  66 y.o.BF PMHx CVA,  Atrial fibrillation ,Grade 2, mixed infiltrating lobular and ductal, involving multiple quadrants Left Breast cancer S/P left mastectomy and axillary lymph node dissection at [facility] in [place] , [place] chemotherapy), Saddle PE and DVT (Dx 1/14), new DM Type 2, HLD, Renal disorder  Who presents with chest pain.  The patient developed chest pain and stroke-like symptoms about two weeks before she flew to the [place] to visit her daughter at the beginning of January. She was admitted here on 1/14 and diagnosed with new saddle PE with right heart strain and started on Lovenox, transitioned to apixaban at discharge.  Since discharge, she has been taking no OTC analgesics, and has not been able to obtain the prescription analgesic she was prescribed. She has obtained her apixaban and is taking it as prescribed (although she told pharmacy tech she had just transitioned to *once* daily dosing). Since discharge, she has had several flares of severe left sided chest pain, sharp and severe, worse with coughing or deep breathing and associated with feeling she can't catch her breath. She also endorses some left sided headache, but no fever, chills, sputum, hemoptysis. On day of admission, she went to Dr. Virgie Dad office, where a CXR appeared to show left lower infiltrate and so she was referred to the ER. Patient due to her dyspnea and pleuritic chest very PCT angiogram was done that showed decreased clot burden. 2-D echo obtained did show a depressed EF with diffuse hypokinesis. Cardiology consulted.    Subjective: 2/4 A/O 4, states her SOB/CP resolved.  Assessment & Plan:  Hypoxia/Dyspnea/Chest pain -Likely multifactorial mole likely secondary to recent acute subtle pulmonary emboli and probable depressed EF with diffuse  hypokinesis/cardiomyopathy per 2-D echo of 05/12/2016, right heart strain -Apixaban to IV heparin per cardiology in anticipation of probable cardiac catheterization on Tuesday  Acute saddle PE (Dx 1/14) -SSx improved  - IV heparin per cardiology in anticipation of probable cardiac catheterization in AM  Anemia -GI consulted by cardiology. Per GI note patient underwent endoscopy this AM, follow up on report   DM type 2 uncontrolled with complication  -0000000 Hemoglobin A1c =12.9  -Hold oral hypoglycemic agents during this hospitalization. -Sensitive SSI   Acute systolic CHF -Cardiac catheterization planned for 2/6  Pulmonary hypertension -See acute see systolic CHF  GERD/Hiatial hernia -PPI to twice a day. Continue Pepcid.  Grade 2, mixed infiltrating lobular and ductal, involving multiple quadrants Left Breast cancer  -Continue Arimidex. Outpatient follow-up.  Elevated troponin/abnormal 2-D echo -Likely secondary to recent acute PE. Troponin level seem to have plateaued. 2-D echo obtained with a EF of 40-45% with diffuse hypokinesis.    DVT prophylaxis: Heparin drip Code Status: Full Family Communication: None Disposition Plan: ?? Dependent on EGD/colonoscopy/cardiac catheterization findings   Consultants:  Cardiology GI   Procedures/Significant Events:  1/14 CT angiogram chest W contrast (previous admission):Positive PE ncluding saddle embolus and bilateral lobar and segmentale.- Moderately large clot burden. Positive for acute CT evidence of right heart strain (RV/LV Ratio = 1.19)  -Patchy nodular ground-glass infiltrative changes in the lungs may be due to multifocal pneumonia, edema, or infarcts. -Metastatic disease would be a less likely consideration but due to history of primary carcinoma, non-contrast chest CT at 3-6 months is recommended.  2/1 Echocardiogram:Left ventricle:  mild LVH. -LVEF = 40% to 45%. Diffuse  hypokinesis. -(grade 1   diastolic  dysfunction). - Mitral valve: There was mild regurgitation. - Pulmonary arteries: . PA  peak pressure: 40 mm Hg (S).  Objective: Vitals:   05/16/16 0916 05/16/16 0920 05/16/16 0930 05/16/16 0940  BP: 123/81 137/80 (!) 122/102 119/67  Pulse: 84 83 73 73  Resp: (!) 29 (!) 27 20 20   Temp: 98 F (36.7 C)     TempSrc: Oral     SpO2: 100% 100% 99% 100%  Weight:      Height:        Intake/Output Summary (Last 24 hours) at 05/16/16 1408 Last data filed at 05/16/16 1002  Gross per 24 hour  Intake          1480.23 ml  Output              200 ml  Net          1280.23 ml   Filed Weights   05/14/16 0536 05/15/16 0410 05/16/16 0437  Weight: 83 kg (183 lb) 82.9 kg (182 lb 11.2 oz) 84.1 kg (185 lb 8 oz)    Examination:  General: A/O 4, NAD, No acute respiratory distress Lungs: Clear to auscultation bilaterally without wheezes or crackles Cardiovascular: Regular rate and rhythm without murmur gallop or rub normal S1 and S2. Left chest wall consistent with mastectomy Abdomen: negative abdominal pain, nondistended, positive soft, bowel sounds, no rebound, no ascites, no appreciable mass Extremities: No significant cyanosis, clubbing, or edema bilateral lower extremities Skin: Negative rashes, lesions, ulcers  Data Reviewed: Care during the described time interval was provided by me .   CBC:  Recent Labs Lab 05/09/16 1518 05/11/16 1507 05/11/16 1832 05/13/16 0428 05/14/16 0353 05/15/16 0418 05/16/16 0303  WBC 7.8 7.7 8.2 8.5 7.7 7.8 7.5  NEUTROABS 5,382 5.6  --   --   --   --   --   HGB 9.6* 9.3* 9.7* 8.6* 8.2* 8.4* 8.1*  HCT 30.8* 29.4* 30.1* 26.8* 25.7* 26.6* 24.5*  MCV 100.0 96.7 96.2 96.1 95.9 95.3 93.5  PLT 375 313 344 314 327 383 PLATELET CLUMPS NOTED ON SMEAR, UNABLE TO ESTIMATE   Basic Metabolic Panel:  Recent Labs Lab 05/11/16 1832 05/13/16 0428 05/14/16 0353 05/15/16 0418 05/16/16 0303  NA 136 135 136 136 137  K 4.4 4.1 4.0 3.6 3.6  CL 102 102 105 105 107   CO2 21* 21* 22 23 19*  GLUCOSE 129* 92 158* 159* 113*  BUN 10 10 10 13 9   CREATININE 0.95 0.86 0.87 0.90 0.80  CALCIUM 9.9 9.1 9.0 9.2 9.3  MG  --   --   --  1.5* 1.4*   Liver Function Tests:  Recent Labs Lab 05/11/16 1507  AST 32  ALT 24  ALKPHOS 101  BILITOT 0.50  PROT 7.4  ALBUMIN 2.8*   Coagulation Profile:  Recent Labs Lab 05/11/16 2028  INR 1.44   Cardiac Enzymes:  Recent Labs Lab 05/12/16 0454 05/13/16 0952 05/13/16 1349 05/13/16 2040  TROPONINI 0.10* 0.09* 0.09* 0.10*   CBG:  Recent Labs Lab 05/15/16 1135 05/15/16 1630 05/15/16 2327 05/16/16 0730 05/16/16 1054  GLUCAP 300* 155* 144* 134* 120*   Radiology Studies: No results found.  Scheduled Meds: . sodium chloride   Intravenous Once  . anastrozole  1 mg Oral Daily  . atorvastatin  20 mg Oral QHS  . carvedilol  12.5 mg Oral BID WC  . famotidine  10 mg Oral Daily  . insulin aspart  0-5 Units Subcutaneous QHS  . insulin aspart  0-9 Units Subcutaneous TID WC  . pantoprazole  40 mg Oral BID AC   Continuous Infusions: . heparin 600 Units/hr (05/16/16 1057)     LOS: 4 days   Time spent:40 min  Faye Ramsay, MD [facility] Pager [phone]  If 7PM-7AM, please contact night-coverage [email or web address] Password [email or web address] 05/16/2016, 2:08 PM

## 2016-05-16 NOTE — Progress Notes (Signed)
ANTICOAGULATION CONSULT NOTE - Follow Up Consult  Pharmacy Consult for Heparin (Apixaban on hold) Indication: atrial fibrillation, Hx DVT/PE  Allergies  Allergen Reactions  . Dust Mite Extract     Patient Measurements: Height: 5\' 2"  (157.5 cm) Weight: 185 lb 8 oz (84.1 kg) IBW/kg (Calculated) : 50.1  Vital Signs: Temp: 98 F (36.7 C) (02/05 0916) Temp Source: Oral (02/05 0916) BP: 119/67 (02/05 0940) Pulse Rate: 73 (02/05 0940)  Labs:  Recent Labs  05/13/16 1349 05/13/16 2040  05/14/16 0353 05/14/16 1350 05/14/16 2105 05/15/16 0418 05/15/16 1404 05/16/16 0303  HGB  --   --   < > 8.2*  --   --  8.4*  --  8.1*  HCT  --   --   --  25.7*  --   --  26.6*  --  24.5*  PLT  --   --   --  327  --   --  383  --  PLATELET CLUMPS NOTED ON SMEAR, UNABLE TO ESTIMATE  APTT  --   --   < > 125* 102* 90* 115*  --   --   HEPARINUNFRC  --   --   < > 1.82*  --  1.11* 0.80* 0.52 0.23*  CREATININE  --   --   --  0.87  --   --  0.90  --  0.80  TROPONINI 0.09* 0.10*  --   --   --   --   --   --   --   < > = values in this interval not displayed.  Estimated Creatinine Clearance: 68.6 mL/min (by C-G formula based on SCr of 0.8 mg/dL).   Assessment: 66 y.o. female with h/o Afib/VTE, Eliquis on hold (last dose 2/2 AM) while awaiting cath Monday.  GI evaluation this AM negative for bleed, to resume heparin this afternoon. For cath 2/6   Goal of Therapy:  Heparin level 0.3-0.7 units/ml Monitor platelets by anticoagulation protocol: Yes   Plan:  Resume heparin at 600 units / hr at 12 noon 8 hour heparin level Daily heparin level and CBC Monitor for s/s bleeding Follow cath plan   Thank you Anette Guarneri, PharmD [phone] 05/16/16 10:30 AM

## 2016-05-16 NOTE — Interval H&P Note (Signed)
History and Physical Interval Note:  05/16/2016 7:42 AM  Carrie Watts  has presented today for surgery, with the diagnosis of anemia, / heme positive stool  The various methods of treatment have been discussed with the patient and family. After consideration of risks, benefits and other options for treatment, the patient has consented to  Procedure(s): COLONOSCOPY (N/A) ESOPHAGOGASTRODUODENOSCOPY (EGD) (N/A) as a surgical intervention .  The patient's history has been reviewed, patient examined, no change in status, stable for surgery.  I have reviewed the patient's chart and labs.  Questions were answered to the patient's satisfaction.     [name]

## 2016-05-16 NOTE — Anesthesia Procedure Notes (Signed)
Procedure Name: MAC Date/Time: 05/16/2016 8:40 AM Performed by: Garrison Columbus T Pre-anesthesia Checklist: Patient identified, Emergency Drugs available, Suction available and Patient being monitored Patient Re-evaluated:Patient Re-evaluated prior to inductionOxygen Delivery Method: Simple face mask Preoxygenation: Pre-oxygenation with 100% oxygen Intubation Type: IV induction Placement Confirmation: positive ETCO2 and breath sounds checked- equal and bilateral Dental Injury: Teeth and Oropharynx as per pre-operative assessment

## 2016-05-16 NOTE — Progress Notes (Signed)
ANTICOAGULATION CONSULT NOTE - Follow Up Consult  Pharmacy Consult for Heparin (Apixaban on hold) Indication: atrial fibrillation, Hx DVT/PE  Allergies  Allergen Reactions  . Dust Mite Extract     Patient Measurements: Height: 5\' 2"  (157.5 cm) Weight: 185 lb 8 oz (84.1 kg) IBW/kg (Calculated) : 50.1  Vital Signs: Temp: 98.5 F (36.9 C) (02/05 1957) Temp Source: Oral (02/05 1957) BP: 122/83 (02/05 1957) Pulse Rate: 101 (02/05 1957)  Labs:  Recent Labs  05/13/16 2040  05/14/16 0353 05/14/16 1350 05/14/16 2105 05/15/16 0418 05/15/16 1404 05/16/16 0303 05/16/16 1947  HGB  --   < > 8.2*  --   --  8.4*  --  8.1*  --   HCT  --   --  25.7*  --   --  26.6*  --  24.5*  --   PLT  --   --  327  --   --  383  --  PLATELET CLUMPS NOTED ON SMEAR, UNABLE TO ESTIMATE  --   APTT  --   < > 125* 102* 90* 115*  --   --   --   HEPARINUNFRC  --   < > 1.82*  --  1.11* 0.80* 0.52 0.23* 0.13*  CREATININE  --   --  0.87  --   --  0.90  --  0.80  --   TROPONINI 0.10*  --   --   --   --   --   --   --   --   < > = values in this interval not displayed.  Estimated Creatinine Clearance: 68.6 mL/min (by C-G formula based on SCr of 0.8 mg/dL).   Assessment: 66 y.o. female with h/o Afib/VTE, Eliquis on hold (last dose 2/2 AM) while awaiting cath Monday.  GI evaluation this AM negative for bleed, to resume heparin this afternoon. For cath 2/6  Initial HL is subtherapeutic at 0.13 on heparin 600 units/hr. Nurse reports no issues with infusion or bleeding.  Goal of Therapy:  Heparin level 0.3-0.7 units/ml Monitor platelets by anticoagulation protocol: Yes   Plan:  Increase heparin to 800 units/hr 6h HL Daily heparin level and CBC Monitor for s/s bleeding  Andrey Cota. Diona Foley, PharmD, BCPS Clinical Pharmacist [phone] 05/16/16 8:18 PM

## 2016-05-17 ENCOUNTER — Encounter (HOSPITAL_COMMUNITY): Payer: Self-pay | Admitting: Gastroenterology

## 2016-05-17 DIAGNOSIS — I5023 Acute on chronic systolic (congestive) heart failure: Secondary | ICD-10-CM | POA: Diagnosis not present

## 2016-05-17 DIAGNOSIS — R748 Abnormal levels of other serum enzymes: Secondary | ICD-10-CM | POA: Diagnosis not present

## 2016-05-17 DIAGNOSIS — I2602 Saddle embolus of pulmonary artery with acute cor pulmonale: Secondary | ICD-10-CM | POA: Diagnosis not present

## 2016-05-17 DIAGNOSIS — R0902 Hypoxemia: Secondary | ICD-10-CM | POA: Diagnosis not present

## 2016-05-17 DIAGNOSIS — J9601 Acute respiratory failure with hypoxia: Secondary | ICD-10-CM | POA: Diagnosis not present

## 2016-05-17 DIAGNOSIS — I429 Cardiomyopathy, unspecified: Secondary | ICD-10-CM | POA: Diagnosis not present

## 2016-05-17 DIAGNOSIS — E1165 Type 2 diabetes mellitus with hyperglycemia: Secondary | ICD-10-CM | POA: Diagnosis not present

## 2016-05-17 DIAGNOSIS — I248 Other forms of acute ischemic heart disease: Secondary | ICD-10-CM | POA: Diagnosis not present

## 2016-05-17 DIAGNOSIS — R079 Chest pain, unspecified: Secondary | ICD-10-CM | POA: Diagnosis not present

## 2016-05-17 DIAGNOSIS — I5021 Acute systolic (congestive) heart failure: Secondary | ICD-10-CM | POA: Diagnosis not present

## 2016-05-17 LAB — BASIC METABOLIC PANEL
ANION GAP: 8 (ref 5–15)
BUN: 8 mg/dL (ref 6–20)
CALCIUM: 9.1 mg/dL (ref 8.9–10.3)
CO2: 21 mmol/L — ABNORMAL LOW (ref 22–32)
Chloride: 108 mmol/L (ref 101–111)
Creatinine, Ser: 0.73 mg/dL (ref 0.44–1.00)
GFR calc Af Amer: >60 mL/min (ref >60)
GLUCOSE: 157 mg/dL — AB (ref 65–99)
Potassium: 3.6 mmol/L (ref 3.5–5.1)
Sodium: 137 mmol/L (ref 135–145)

## 2016-05-17 LAB — HEPARIN LEVEL (UNFRACTIONATED): Heparin Unfractionated: 0.28 IU/mL — ABNORMAL LOW (ref 0.30–0.70)

## 2016-05-17 LAB — MAGNESIUM: Magnesium: 1.3 mg/dL — ABNORMAL LOW (ref 1.7–2.4)

## 2016-05-17 LAB — CBC
HCT: 25.3 % — ABNORMAL LOW (ref 36.0–46.0)
HEMOGLOBIN: 8 g/dL — AB (ref 12.0–15.0)
MCH: 30.1 pg (ref 26.0–34.0)
MCHC: 31.6 g/dL (ref 30.0–36.0)
MCV: 95.1 fL (ref 78.0–100.0)
PLATELETS: 326 10*3/uL (ref 150–400)
RBC: 2.66 MIL/uL — AB (ref 3.87–5.11)
RDW: 17.1 % — ABNORMAL HIGH (ref 11.5–15.5)
WBC: 8.2 10*3/uL (ref 4.0–10.5)

## 2016-05-17 LAB — GLUCOSE, CAPILLARY
GLUCOSE-CAPILLARY: 250 mg/dL — AB (ref 65–99)
GLUCOSE-CAPILLARY: 150 mg/dL — AB (ref 65–99)
Glucose-Capillary: 143 mg/dL — ABNORMAL HIGH (ref 65–99)
Glucose-Capillary: 166 mg/dL — ABNORMAL HIGH (ref 65–99)

## 2016-05-17 MED ORDER — SODIUM CHLORIDE 0.9 % WEIGHT BASED INFUSION
1.0000 mL/kg/h | INTRAVENOUS | Status: DC
  Administered 2016-05-18: 1 mL/kg/h via INTRAVENOUS

## 2016-05-17 MED ORDER — ASPIRIN 81 MG PO CHEW
81.0000 mg | CHEWABLE_TABLET | ORAL | Status: AC
  Administered 2016-05-18: 81 mg via ORAL
  Filled 2016-05-17: qty 1

## 2016-05-17 MED ORDER — SODIUM CHLORIDE 0.9% FLUSH: 3.0000 mL | INTRAVENOUS | Status: DC | PRN

## 2016-05-17 MED ORDER — SODIUM CHLORIDE 0.9 % IV SOLN: 250.0000 mL | INTRAVENOUS | Status: DC | PRN

## 2016-05-17 MED ORDER — MAGNESIUM SULFATE 2 GM/50ML IV SOLN
2.0000 g | Freq: Once | INTRAVENOUS | Status: AC
  Administered 2016-05-17: 2 g via INTRAVENOUS
  Filled 2016-05-17: qty 50

## 2016-05-17 MED ORDER — SODIUM CHLORIDE 0.9% FLUSH: 3.0000 mL | Freq: Two times a day (BID) | INTRAVENOUS | Status: DC

## 2016-05-17 MED ORDER — SODIUM CHLORIDE 0.9 % WEIGHT BASED INFUSION
3.0000 mL/kg/h | INTRAVENOUS | Status: DC
  Administered 2016-05-18: 3 mL/kg/h via INTRAVENOUS

## 2016-05-17 NOTE — Progress Notes (Signed)
Progress Note  Patient Name: Carrie Watts Date of Encounter: 05/17/2016  Primary Cardiologist: Dr. Meda Coffee  Subjective   Sitting up in chair without complaints No chest pain no sob  Inpatient Medications    Scheduled Meds: . sodium chloride   Intravenous Once  . anastrozole  1 mg Oral Daily  . atorvastatin  20 mg Oral QHS  . carvedilol  12.5 mg Oral BID WC  . famotidine  10 mg Oral Daily  . insulin aspart  0-5 Units Subcutaneous QHS  . insulin aspart  0-9 Units Subcutaneous TID WC  . pantoprazole  40 mg Oral BID AC   Continuous Infusions: . heparin 900 Units/hr (05/17/16 0313)   PRN Meds: HYDROcodone-acetaminophen, HYDROcodone-homatropine, morphine injection, ondansetron (ZOFRAN) IV, traMADol   Vital Signs    Vitals:   05/16/16 1640 05/16/16 1957 05/17/16 0514 05/17/16 0809  BP: 111/60 122/83 119/75 127/88  Pulse: 85 (!) 101 (!) 101 97  Resp: 18 18 18    Temp: 98 F (36.7 C) 98.5 F (36.9 C) 98.4 F (36.9 C)   TempSrc: Oral Oral Oral   SpO2: 100% 100% 100%   Weight:   187 lb 6.4 oz (85 kg)   Height:        Intake/Output Summary (Last 24 hours) at 05/17/16 1001 Last data filed at 05/17/16 0930  Gross per 24 hour  Intake            550.3 ml  Output             1250 ml  Net           -699.7 ml   Filed Weights   05/15/16 0410 05/16/16 0437 05/17/16 0514  Weight: 182 lb 11.2 oz (82.9 kg) 185 lb 8 oz (84.1 kg) 187 lb 6.4 oz (85 kg)    Telemetry    5 beats WCT - Personally Reviewed  ECG    No new - Personally Reviewed  Physical Exam   GEN: No acute distress.   Neck: No JVD, sitting upright  Cardiac: RRR, no murmurs, rubs, or gallops.  Respiratory: Clear to auscultation bilaterally. GI: Soft, nontender, non-distended  MS: No edema; No deformity. Lt gt toe with crack in skin, tender Neuro:  Nonfocal  Psych: Normal affect   Labs    Chemistry Recent Labs Lab 05/11/16 1507  05/15/16 0418 05/16/16 0303 05/17/16 0217  NA 137  < > 136 137 137    K 4.7  < > 3.6 3.6 3.6  CL  --   < > 105 107 108  CO2 22  < > 23 19* 21*  GLUCOSE 114  < > 159* 113* 157*  BUN 11.6  < > 13 9 8   CREATININE 0.9  < > 0.90 0.80 0.73  CALCIUM 10.2  < > 9.2 9.3 9.1  PROT 7.4  --   --   --   --   ALBUMIN 2.8*  --   --   --   --   AST 32  --   --   --   --   ALT 24  --   --   --   --   ALKPHOS 101  --   --   --   --   BILITOT 0.50  --   --   --   --   GFRNONAA  --   < > >60 >60 >60  GFRAA  --   < > >60 >60 >60  ANIONGAP 11  < >  8 11 8   < > = values in this interval not displayed.   Hematology Recent Labs Lab 05/15/16 0418 05/16/16 0303 05/17/16 0217  WBC 7.8 7.5 8.2  RBC 2.79* 2.62* 2.66*  HGB 8.4* 8.1* 8.0*  HCT 26.6* 24.5* 25.3*  MCV 95.3 93.5 95.1  MCH 30.1 30.9 30.1  MCHC 31.6 33.1 31.6  RDW 16.9* 16.7* 17.1*  PLT 383 PLATELET CLUMPS NOTED ON SMEAR, UNABLE TO ESTIMATE 326    Cardiac Enzymes Recent Labs Lab 05/12/16 0454 05/13/16 0952 05/13/16 1349 05/13/16 2040  TROPONINI 0.10* 0.09* 0.09* 0.10*    Recent Labs Lab 05/11/16 1842  TROPIPOC 0.13*     BNPNo results for input(s): BNP, PROBNP in the last 168 hours.   DDimer No results for input(s): DDIMER in the last 168 hours.   Radiology    No results found.  Cardiac Studies   05/12/16  Study Conclusions  - Left ventricle: The cavity size was normal. Wall thickness was increased in a pattern of mild LVH. Systolic function was mildly to moderately reduced. The estimated ejection fraction was in the range of 40% to 45%. Diffuse hypokinesis. Doppler parameters are consistent with abnormal left ventricular relaxation (grade 1 diastolic dysfunction). - Mitral valve: There was mild regurgitation. - Pulmonary arteries: Systolic pressure was mildly increased. PA peak pressure: 40 mm Hg (S).  Impressions:  - Mild to moderate global reduction in LV systolic function; grade 1 diastolic dysfunction; mild LVH; mild MR; trace TR with mildly elevated  pulmonary pressure.  Patient Profile       66 y.o. female with a history of DM, L breast cancer s/p lumpectomy and 5 rounds of chemo in [place] and recent admission for PE and DVT at [facility] (04/24/16-05/03/16) presenting with sob, new Echo with decreased EF.  Assessment & Plan    Reduced LV function Echo this admission 05/12/16 showed 40-45% which reduced from 55-60% from 04/24/16. No RV dysfunction on recent echo however R sided heart strain on CTA of chest. Hx of angina but no cath or stress test in past.  Now GI eval complete, Dr. Lysbeth Penner note mentions cardiac cath once GI eval complete, + polypectomy today.   --on Heparin just restarting post EGD and colonoscopy ? Cardiac cath tomorrow  Dr. Acie Fredrickson to see  (pk troponin .13) not yet scheduled.  PE and DVT - reduced clot burden on CTA.  Eliquis stopped pt on heparin.    Anemia with Hgb 8.0 on IV heparin post polypectomy.  Signed, Cecilie Kicks, NP  05/17/2016, 10:01 AM    Attending Note:   The patient was seen and examined.  Agree with assessment and plan as noted above.  Changes made to the above note as needed.  Patient seen and independently examined with Cecilie Kicks, NP .   We discussed all aspects of the encounter. I agree with the assessment and plan as stated above.  1. Acute Systolic CHF:  Plan for cardiac cath tomorrow  Hb is low but stable  2.  Anemia - s/p polypectomy -    I have spent a total of 40 minutes with patient reviewing hospital  notes , telemetry, EKGs, labs and examining patient as well as establishing an assessment and plan that was discussed with the patient. > 50% of time was spent in direct patient care.    Thayer Headings, Brooke Bonito., MD, [facility] 05/17/2016, 1:24 PM [address],  [place] Pager [phone]

## 2016-05-17 NOTE — Progress Notes (Signed)
PROGRESS NOTE    Carrie Watts  [id number] DOB: [birth year] DOA: 05/11/2016 PCP: No PCP Per Patient     Brief Narrative:  66 y.o.BF PMHx CVA,  Atrial fibrillation ,Grade 2, mixed infiltrating lobular and ductal, involving multiple quadrants Left Breast cancer S/P left mastectomy and axillary lymph node dissection at [facility] in [place] , [place] chemotherapy), Saddle PE and DVT (Dx 1/14), new DM Type 2, HLD, Renal disorder, who presented with chest pain.  The patient developed chest pain and stroke-like symptoms about two weeks before she flew to the [place] to visit her daughter at the beginning of January. She was admitted here on 1/14 and diagnosed with new saddle PE with right heart strain and started on Lovenox, transitioned to apixaban at discharge.  Since discharge, she has been taking no OTC analgesics, and has not been able to obtain the prescription analgesic she was prescribed. She has obtained her apixaban and is taking it as prescribed (although she told pharmacy tech she had just transitioned to *once* daily dosing). Since discharge, she has had several flares of severe left sided chest pain, sharp and severe, worse with coughing or deep breathing and associated with feeling she can't catch her breath. She also endorses some left sided headache, but no fever, chills, sputum, hemoptysis. On day of admission, she went to Dr. Virgie Dad office, where a CXR appeared to show left lower infiltrate and so she was referred to the ER.  Patient due to her dyspnea and pleuritic chest very PCT angiogram was done that showed decreased clot burden. 2-D echo obtained did show a depressed EF with diffuse hypokinesis. Cardiology consulted.  Subjective: 2/4 A/O 4, states her SOB/CP resolved.  Assessment & Plan:  Hypoxia/Dyspnea/Chest pain - Likely multifactorial mole likely secondary to recent acute subtle pulmonary emboli and probable depressed EF with diffuse  hypokinesis/cardiomyopathy per 2-D echo of 05/12/2016, right heart strain - Apixaban to IV heparin per cardiology in anticipation of probable cardiac catheterization today vs tomorrow   Acute saddle PE (Dx 1/14) -SSx improved  - IV heparin per cardiology in anticipation of probable cardiac catheterization as noted above   Anemia in the setting of AC, acute illness  - GI consulted by cardiology - EGD done 2.5 and unremarkable study, recommendations is to continue monitor for signs of bleeding, OK to resume AC, OK to proceed with cardiac work up  - CBC in AM  DM type 2 uncontrolled with complication  - 0000000 Hemoglobin A1c =12.9  - Hold oral hypoglycemic agents during this hospitalization. - diabetic educator consulted  - Sensitive SSI for now  Hypomagnesemia - supplement and repeat Mg in AM  Acute systolic CHF - Cardiac catheterization planned today or in AM - weight trend in the past 72 hours  Filed Weights   05/15/16 0410 05/16/16 0437 05/17/16 0514  Weight: 82.9 kg (182 lb 11.2 oz) 84.1 kg (185 lb 8 oz) 85 kg (187 lb 6.4 oz)   Pulmonary hypertension - See acute see systolic CHF  GERD/Hiatial hernia - PPI to twice a day. Continue Pepcid.  Grade 2, mixed infiltrating lobular and ductal, involving multiple quadrants Left Breast cancer  - Continue Arimidex. Outpatient follow-up.  Elevated troponin/abnormal 2-D echo - Likely secondary to recent acute PE.  - Troponin level seem to have plateaued. 2-D echo obtained with a EF of 40-45% with diffuse hypokinesis.    DVT prophylaxis: Heparin drip Code Status: Full Family Communication: None Disposition Plan: ?? Dependent on cardiac cath results  Consultants:  Cardiology GI   Procedures/Significant Events:  1/14 CT angiogram chest W contrast (previous admission):Positive PE ncluding saddle embolus and bilateral lobar and segmentale.- Moderately large clot burden. Positive for acute CT evidence of right heart strain  (RV/LV Ratio = 1.19)  -Patchy nodular ground-glass infiltrative changes in the lungs may be due to multifocal pneumonia, edema, or infarcts. -Metastatic disease would be a less likely consideration but due to history of primary carcinoma, non-contrast chest CT at 3-6 months is recommended.  2/1 Echocardiogram:Left ventricle:  mild LVH. -LVEF = 40% to 45%. Diffuse hypokinesis. -(grade 1   diastolic dysfunction). - Mitral valve: There was mild regurgitation. - Pulmonary arteries: . PA  peak pressure: 40 mm Hg (S).  Objective: Vitals:   05/16/16 1957 05/17/16 0514 05/17/16 0809 05/17/16 1132  BP: 122/83 119/75 127/88 119/72  Pulse: (!) 101 (!) 101 97 79  Resp: 18 18  17   Temp: 98.5 F (36.9 C) 98.4 F (36.9 C)  97.4 F (36.3 C)  TempSrc: Oral Oral  Oral  SpO2: 100% 100%  99%  Weight:  85 kg (187 lb 6.4 oz)    Height:        Intake/Output Summary (Last 24 hours) at 05/17/16 1352 Last data filed at 05/17/16 0930  Gross per 24 hour  Intake              424 ml  Output             1250 ml  Net             -826 ml   Filed Weights   05/15/16 0410 05/16/16 0437 05/17/16 0514  Weight: 82.9 kg (182 lb 11.2 oz) 84.1 kg (185 lb 8 oz) 85 kg (187 lb 6.4 oz)    Examination:  General: A/O 4, NAD, No acute respiratory distress Lungs: Clear to auscultation bilaterally without wheezes or crackles Cardiovascular: Regular rate and rhythm without murmur gallop or rub normal S1 and S2. Left chest wall consistent with mastectomy Abdomen: negative abdominal pain, nondistended, positive soft, bowel sounds, no rebound, no ascites, no appreciable mass Extremities: No significant cyanosis, clubbing, or edema bilateral lower extremities Skin: Negative rashes, lesions, ulcers  Data Reviewed: Care during the described time interval was provided by me .   CBC:  Recent Labs Lab 05/11/16 1507  05/13/16 0428 05/14/16 0353 05/15/16 0418 05/16/16 0303 05/17/16 0217  WBC 7.7  < > 8.5 7.7 7.8 7.5 8.2   NEUTROABS 5.6  --   --   --   --   --   --   HGB 9.3*  < > 8.6* 8.2* 8.4* 8.1* 8.0*  HCT 29.4*  < > 26.8* 25.7* 26.6* 24.5* 25.3*  MCV 96.7  < > 96.1 95.9 95.3 93.5 95.1  PLT 313  < > 314 327 383 PLATELET CLUMPS NOTED ON SMEAR, UNABLE TO ESTIMATE 326  < > = values in this interval not displayed. Basic Metabolic Panel:  Recent Labs Lab 05/13/16 0428 05/14/16 0353 05/15/16 0418 05/16/16 0303 05/17/16 0217  NA 135 136 136 137 137  K 4.1 4.0 3.6 3.6 3.6  CL 102 105 105 107 108  CO2 21* 22 23 19* 21*  GLUCOSE 92 158* 159* 113* 157*  BUN 10 10 13 9 8   CREATININE 0.86 0.87 0.90 0.80 0.73  CALCIUM 9.1 9.0 9.2 9.3 9.1  MG  --   --  1.5* 1.4* 1.3*   Liver Function Tests:  Recent Labs Lab 05/11/16 1507  AST 32  ALT 24  ALKPHOS 101  BILITOT 0.50  PROT 7.4  ALBUMIN 2.8*   Coagulation Profile:  Recent Labs Lab 05/11/16 2028  INR 1.44   Cardiac Enzymes:  Recent Labs Lab 05/12/16 0454 05/13/16 0952 05/13/16 1349 05/13/16 2040  TROPONINI 0.10* 0.09* 0.09* 0.10*   CBG:  Recent Labs Lab 05/16/16 1054 05/16/16 1642 05/16/16 2129 05/17/16 0727 05/17/16 1151  GLUCAP 120* 122* 157* 143* 250*   Radiology Studies: No results found.  Scheduled Meds: . sodium chloride   Intravenous Once  . anastrozole  1 mg Oral Daily  . atorvastatin  20 mg Oral QHS  . carvedilol  12.5 mg Oral BID WC  . famotidine  10 mg Oral Daily  . insulin aspart  0-5 Units Subcutaneous QHS  . insulin aspart  0-9 Units Subcutaneous TID WC  . pantoprazole  40 mg Oral BID AC   Continuous Infusions: . heparin 900 Units/hr (05/17/16 0313)     LOS: 5 days   Time spent:40 min  Faye Ramsay, MD [facility] Pager [phone]  If 7PM-7AM, please contact night-coverage [email or web address] Password [email or web address] 05/17/2016, 1:52 PM

## 2016-05-17 NOTE — Progress Notes (Addendum)
Pt had questionable Vtach. Pt asymptomatic.observed closely

## 2016-05-17 NOTE — Progress Notes (Signed)
[place] for Heparin Indication: hx DVT/PE/Afib  Allergies  Allergen Reactions  . Dust Mite Extract     Patient Measurements: Height: 5\' 2"  (157.5 cm) Weight: 185 lb 8 oz (84.1 kg) IBW/kg (Calculated) : 50.1 Heparin Dosing Weight: 68.5kg  Vital Signs: Temp: 98.5 F (36.9 C) (02/05 1957) Temp Source: Oral (02/05 1957) BP: 122/83 (02/05 1957) Pulse Rate: 101 (02/05 1957)  Labs:  Recent Labs  05/14/16 1350 05/14/16 2105 05/15/16 0418  05/16/16 0303 05/16/16 1947 05/17/16 0217  HGB  --   --  8.4*  --  8.1*  --  8.0*  HCT  --   --  26.6*  --  24.5*  --  25.3*  PLT  --   --  383  --  PLATELET CLUMPS NOTED ON SMEAR, UNABLE TO ESTIMATE  --  326  APTT 102* 90* 115*  --   --   --   --   HEPARINUNFRC  --  1.11* 0.80*  < > 0.23* 0.13* 0.28*  CREATININE  --   --  0.90  --  0.80  --  0.73  < > = values in this interval not displayed.  Estimated Creatinine Clearance: 68.6 mL/min (by C-G formula based on SCr of 0.73 mg/dL).   Assessment: 66 y.o. female with h/o Afib/VTE, Eliquis on hold while awaiting cath today, for heparin  Goal of Therapy:  Heparin level 0.3-0.7 units/ml Monitor platelets by anticoagulation protocol: Yes   Plan:  Increase Heparin 900 units/hr  Caryl Pina 05/17/2016,3:06 AM

## 2016-05-17 NOTE — Anesthesia Postprocedure Evaluation (Addendum)
Anesthesia Post Note  Patient: Carrie Watts  Procedure(s) Performed: Procedure(s) (LRB): COLONOSCOPY (N/A) ESOPHAGOGASTRODUODENOSCOPY (EGD) (N/A)  Patient location during evaluation: PACU Anesthesia Type: MAC Level of consciousness: awake and alert Pain management: pain level controlled Vital Signs Assessment: post-procedure vital signs reviewed and stable Respiratory status: spontaneous breathing, nonlabored ventilation, respiratory function stable and patient connected to nasal cannula oxygen Cardiovascular status: stable and blood pressure returned to baseline Anesthetic complications: no       Last Vitals:  Vitals:   05/17/16 0514 05/17/16 0809  BP: 119/75 127/88  Pulse: (!) 101 97  Resp: 18   Temp: 36.9 C     Last Pain:  Vitals:   05/17/16 0700  TempSrc:   PainSc: 0-No pain                 Briannie Gutierrez

## 2016-05-17 NOTE — Progress Notes (Signed)
Pt is alert and oriented traveled for [facility] [place], vital signs stable with numb tingling in hand feet and buttocks. Newly diagnosis Diabetic, will need set-up with MD to follow up here.

## 2016-05-18 ENCOUNTER — Ambulatory Visit: Payer: Self-pay | Admitting: Internal Medicine

## 2016-05-18 ENCOUNTER — Telehealth: Payer: Self-pay | Admitting: *Deleted

## 2016-05-18 ENCOUNTER — Encounter (HOSPITAL_COMMUNITY)
Admission: EM | Disposition: A | Discharge status: Home / Self Care | Payer: Self-pay | Source: Home / Self Care | Attending: Internal Medicine

## 2016-05-18 DIAGNOSIS — I2510 Atherosclerotic heart disease of native coronary artery without angina pectoris: Secondary | ICD-10-CM

## 2016-05-18 DIAGNOSIS — R0902 Hypoxemia: Secondary | ICD-10-CM | POA: Diagnosis not present

## 2016-05-18 DIAGNOSIS — E1165 Type 2 diabetes mellitus with hyperglycemia: Secondary | ICD-10-CM | POA: Diagnosis not present

## 2016-05-18 DIAGNOSIS — J9601 Acute respiratory failure with hypoxia: Secondary | ICD-10-CM | POA: Diagnosis not present

## 2016-05-18 DIAGNOSIS — I248 Other forms of acute ischemic heart disease: Secondary | ICD-10-CM | POA: Diagnosis not present

## 2016-05-18 DIAGNOSIS — R079 Chest pain, unspecified: Secondary | ICD-10-CM | POA: Diagnosis not present

## 2016-05-18 DIAGNOSIS — I5023 Acute on chronic systolic (congestive) heart failure: Secondary | ICD-10-CM | POA: Diagnosis not present

## 2016-05-18 DIAGNOSIS — I2602 Saddle embolus of pulmonary artery with acute cor pulmonale: Secondary | ICD-10-CM | POA: Diagnosis not present

## 2016-05-18 DIAGNOSIS — I5021 Acute systolic (congestive) heart failure: Secondary | ICD-10-CM | POA: Diagnosis not present

## 2016-05-18 DIAGNOSIS — I429 Cardiomyopathy, unspecified: Secondary | ICD-10-CM | POA: Diagnosis not present

## 2016-05-18 HISTORY — PX: LEFT HEART CATH AND CORONARY ANGIOGRAPHY: CATH118249

## 2016-05-18 LAB — CBC
HEMATOCRIT: 25.1 % — AB (ref 36.0–46.0)
HEMOGLOBIN: 8.1 g/dL — AB (ref 12.0–15.0)
MCH: 31.2 pg (ref 26.0–34.0)
MCHC: 32.3 g/dL (ref 30.0–36.0)
MCV: 96.5 fL (ref 78.0–100.0)
Platelets: 324 10*3/uL (ref 150–400)
RBC: 2.6 MIL/uL — AB (ref 3.87–5.11)
RDW: 17.6 % — ABNORMAL HIGH (ref 11.5–15.5)
WBC: 7.5 10*3/uL (ref 4.0–10.5)

## 2016-05-18 LAB — PROTIME-INR
INR: 1.11
PROTHROMBIN TIME: 14.4 s (ref 11.4–15.2)

## 2016-05-18 LAB — BASIC METABOLIC PANEL
ANION GAP: 11 (ref 5–15)
BUN: 7 mg/dL (ref 6–20)
CHLORIDE: 106 mmol/L (ref 101–111)
CO2: 20 mmol/L — ABNORMAL LOW (ref 22–32)
Calcium: 8.8 mg/dL — ABNORMAL LOW (ref 8.9–10.3)
Creatinine, Ser: 0.73 mg/dL (ref 0.44–1.00)
Glucose, Bld: 167 mg/dL — ABNORMAL HIGH (ref 65–99)
POTASSIUM: 3.6 mmol/L (ref 3.5–5.1)
Sodium: 137 mmol/L (ref 135–145)

## 2016-05-18 LAB — GLUCOSE, CAPILLARY
GLUCOSE-CAPILLARY: 135 mg/dL — AB (ref 65–99)
GLUCOSE-CAPILLARY: 150 mg/dL — AB (ref 65–99)
GLUCOSE-CAPILLARY: 113 mg/dL — AB (ref 65–99)
Glucose-Capillary: 158 mg/dL — ABNORMAL HIGH (ref 65–99)

## 2016-05-18 LAB — HEPARIN LEVEL (UNFRACTIONATED): Heparin Unfractionated: 0.45 IU/mL (ref 0.30–0.70)

## 2016-05-18 LAB — MAGNESIUM: MAGNESIUM: 1.6 mg/dL — AB (ref 1.7–2.4)

## 2016-05-18 SURGERY — LEFT HEART CATH AND CORONARY ANGIOGRAPHY
Anesthesia: LOCAL

## 2016-05-18 MED ORDER — VERAPAMIL HCL 2.5 MG/ML IV SOLN
INTRAVENOUS | Status: DC | PRN
  Administered 2016-05-18 (×2): 10 mL via INTRA_ARTERIAL

## 2016-05-18 MED ORDER — FENTANYL CITRATE (PF) 100 MCG/2ML IJ SOLN
INTRAMUSCULAR | Status: DC | PRN
  Administered 2016-05-18: 25 ug via INTRAVENOUS
  Administered 2016-05-18: 50 ug via INTRAVENOUS

## 2016-05-18 MED ORDER — FENTANYL CITRATE (PF) 100 MCG/2ML IJ SOLN
INTRAMUSCULAR | Status: AC
  Filled 2016-05-18: qty 2

## 2016-05-18 MED ORDER — SODIUM CHLORIDE 0.9 % IV SOLN
INTRAVENOUS | Status: AC
  Administered 2016-05-18: 16:00:00 via INTRAVENOUS

## 2016-05-18 MED ORDER — MIDAZOLAM HCL 2 MG/2ML IJ SOLN
INTRAMUSCULAR | Status: AC
  Filled 2016-05-18: qty 2

## 2016-05-18 MED ORDER — LIDOCAINE HCL (PF) 1 % IJ SOLN
INTRAMUSCULAR | Status: DC | PRN
  Administered 2016-05-18: 2 mL

## 2016-05-18 MED ORDER — NITROGLYCERIN 1 MG/10 ML FOR IR/CATH LAB
INTRA_ARTERIAL | Status: DC | PRN
  Administered 2016-05-18: 100 ug via INTRA_ARTERIAL

## 2016-05-18 MED ORDER — HEPARIN (PORCINE) IN NACL 2-0.9 UNIT/ML-% IJ SOLN
INTRAMUSCULAR | Status: DC | PRN
  Administered 2016-05-18: 1000 mL

## 2016-05-18 MED ORDER — LIDOCAINE HCL (PF) 1 % IJ SOLN
INTRAMUSCULAR | Status: AC
  Filled 2016-05-18: qty 30

## 2016-05-18 MED ORDER — MAGNESIUM SULFATE 2 GM/50ML IV SOLN
2.0000 g | Freq: Once | INTRAVENOUS | Status: AC
  Administered 2016-05-18: 2 g via INTRAVENOUS
  Filled 2016-05-18: qty 50

## 2016-05-18 MED ORDER — ACETAMINOPHEN 325 MG PO TABS: 650.0000 mg | ORAL_TABLET | ORAL | Status: DC | PRN

## 2016-05-18 MED ORDER — HEPARIN (PORCINE) IN NACL 2-0.9 UNIT/ML-% IJ SOLN
INTRAMUSCULAR | Status: AC
Start: 2016-05-18 — End: 2016-05-18
  Filled 2016-05-18: qty 1000

## 2016-05-18 MED ORDER — NITROGLYCERIN 1 MG/10 ML FOR IR/CATH LAB
INTRA_ARTERIAL | Status: AC
Start: 2016-05-18 — End: 2016-05-18
  Filled 2016-05-18: qty 10

## 2016-05-18 MED ORDER — MIDAZOLAM HCL 2 MG/2ML IJ SOLN
INTRAMUSCULAR | Status: DC | PRN
  Administered 2016-05-18: 2 mg via INTRAVENOUS

## 2016-05-18 MED ORDER — SODIUM CHLORIDE 0.9% FLUSH: 3.0000 mL | INTRAVENOUS | Status: DC | PRN

## 2016-05-18 MED ORDER — IOPAMIDOL (ISOVUE-370) INJECTION 76%
INTRAVENOUS | Status: AC
  Filled 2016-05-18: qty 50

## 2016-05-18 MED ORDER — HEPARIN (PORCINE) IN NACL 100-0.45 UNIT/ML-% IJ SOLN
900.0000 [IU]/h | INTRAMUSCULAR | Status: DC
  Administered 2016-05-18: 900 [IU]/h via INTRAVENOUS
  Filled 2016-05-18: qty 250

## 2016-05-18 MED ORDER — VERAPAMIL HCL 2.5 MG/ML IV SOLN
INTRAVENOUS | Status: AC
  Filled 2016-05-18: qty 2

## 2016-05-18 MED ORDER — HEPARIN SODIUM (PORCINE) 1000 UNIT/ML IJ SOLN
INTRAMUSCULAR | Status: DC | PRN
  Administered 2016-05-18: 4500 [IU] via INTRAVENOUS

## 2016-05-18 MED ORDER — SODIUM CHLORIDE 0.9% FLUSH
3.0000 mL | Freq: Two times a day (BID) | INTRAVENOUS | Status: DC
  Administered 2016-05-18 – 2016-05-21 (×7): 3 mL via INTRAVENOUS

## 2016-05-18 MED ORDER — HEPARIN SODIUM (PORCINE) 1000 UNIT/ML IJ SOLN
INTRAMUSCULAR | Status: AC
  Filled 2016-05-18: qty 1

## 2016-05-18 MED ORDER — LIVING WELL WITH DIABETES BOOK
Freq: Once | Status: AC
  Administered 2016-05-18: 13:00:00
  Filled 2016-05-18: qty 1

## 2016-05-18 MED ORDER — SODIUM CHLORIDE 0.9 % IV SOLN: 250.0000 mL | INTRAVENOUS | Status: DC | PRN

## 2016-05-18 MED ORDER — ONDANSETRON HCL 4 MG/2ML IJ SOLN: 4.0000 mg | Freq: Four times a day (QID) | INTRAMUSCULAR | Status: DC | PRN

## 2016-05-18 MED ORDER — IOPAMIDOL (ISOVUE-370) INJECTION 76%
INTRAVENOUS | Status: DC | PRN
  Administered 2016-05-18: 65 mL via INTRA_ARTERIAL

## 2016-05-18 SURGICAL SUPPLY — 9 items
CATH IMPULSE 5F ANG/FL3.5 (CATHETERS) ×2 IMPLANT
DEVICE RAD COMP TR BAND LRG (VASCULAR PRODUCTS) ×2 IMPLANT
GLIDESHEATH SLEND SS 6F .021 (SHEATH) ×2 IMPLANT
GUIDEWIRE INQWIRE 1.5J.035X260 (WIRE) ×1 IMPLANT
INQWIRE 1.5J .035X260CM (WIRE) ×2
KIT HEART LEFT (KITS) ×2 IMPLANT
PACK CARDIAC CATHETERIZATION (CUSTOM PROCEDURE TRAY) ×2 IMPLANT
TRANSDUCER W/STOPCOCK (MISCELLANEOUS) ×2 IMPLANT
TUBING CIL FLEX 10 FLL-RA (TUBING) ×2 IMPLANT

## 2016-05-18 NOTE — Progress Notes (Signed)
Progress Note  Patient Name: Carrie Watts Date of Encounter: 05/18/2016  Primary Cardiologist: Dr. Meda Watts  Subjective   No chest pain, mild dyspnea with walking. Still has lower leg edema. Her left great toe "burst" from the fluid.  Inpatient Medications    Scheduled Meds: . sodium chloride   Intravenous Once  . anastrozole  1 mg Oral Daily  . atorvastatin  20 mg Oral QHS  . carvedilol  12.5 mg Oral BID WC  . famotidine  10 mg Oral Daily  . insulin aspart  0-5 Units Subcutaneous QHS  . insulin aspart  0-9 Units Subcutaneous TID WC  . magnesium sulfate 1 - 4 g bolus IVPB  2 g Intravenous Once  . pantoprazole  40 mg Oral BID AC  . sodium chloride flush  3 mL Intravenous Q12H   Continuous Infusions: . sodium chloride 1 mL/kg/hr (05/18/16 0655)  . heparin 900 Units/hr (05/18/16 0558)   PRN Meds: sodium chloride, HYDROcodone-acetaminophen, HYDROcodone-homatropine, morphine injection, ondansetron (ZOFRAN) IV, sodium chloride flush, traMADol   Vital Signs    Vitals:   05/17/16 1548 05/17/16 1956 05/18/16 0350 05/18/16 0631  BP: 130/79 131/62  131/81  Pulse: 90 79  70  Resp: 18 18  16   Temp: 98.3 F (36.8 C) 98 F (36.7 C)  98.2 F (36.8 C)  TempSrc: Oral Oral  Oral  SpO2: 92% 100%  95%  Weight:   189 lb 6.4 oz (85.9 kg)   Height:        Intake/Output Summary (Last 24 hours) at 05/18/16 1123 Last data filed at 05/18/16 0850  Gross per 24 hour  Intake              615 ml  Output              950 ml  Net             -335 ml   Filed Weights   05/16/16 0437 05/17/16 0514 05/18/16 0350  Weight: 185 lb 8 oz (84.1 kg) 187 lb 6.4 oz (85 kg) 189 lb 6.4 oz (85.9 kg)    Telemetry    SR 70's-80's with occ PVCs - Personally Reviewed  ECG    No new - Personally Reviewed  Physical Exam   GEN: No acute distress.   Neck: No JVD, sitting upright  Cardiac: RRR, no murmurs, rubs, or gallops.  Respiratory: Clear to auscultation bilaterally. GI: Soft, nontender,  non-distended  MS: 1+ pedal edema;  Lt gt toe with crack in skin, tender Neuro:  Nonfocal  Psych: Normal affect   Labs    Chemistry Recent Labs Lab 05/11/16 1507  05/16/16 0303 05/17/16 0217 05/18/16 0526  NA 137  < > 137 137 137  K 4.7  < > 3.6 3.6 3.6  CL  --   < > 107 108 106  CO2 22  < > 19* 21* 20*  GLUCOSE 114  < > 113* 157* 167*  BUN 11.6  < > 9 8 7   CREATININE 0.9  < > 0.80 0.73 0.73  CALCIUM 10.2  < > 9.3 9.1 8.8*  PROT 7.4  --   --   --   --   ALBUMIN 2.8*  --   --   --   --   AST 32  --   --   --   --   ALT 24  --   --   --   --   ALKPHOS 101  --   --   --   --  BILITOT 0.50  --   --   --   --   GFRNONAA  --   < > >60 >60 >60  GFRAA  --   < > >60 >60 >60  ANIONGAP 11  < > 11 8 11   < > = values in this interval not displayed.   Hematology  Recent Labs Lab 05/16/16 0303 05/17/16 0217 05/18/16 0526  WBC 7.5 8.2 7.5  RBC 2.62* 2.66* 2.60*  HGB 8.1* 8.0* 8.1*  HCT 24.5* 25.3* 25.1*  MCV 93.5 95.1 96.5  MCH 30.9 30.1 31.2  MCHC 33.1 31.6 32.3  RDW 16.7* 17.1* 17.6*  PLT PLATELET CLUMPS NOTED ON SMEAR, UNABLE TO ESTIMATE 326 324    Cardiac Enzymes  Recent Labs Lab 05/12/16 0454 05/13/16 0952 05/13/16 1349 05/13/16 2040  TROPONINI 0.10* 0.09* 0.09* 0.10*     Recent Labs Lab 05/11/16 1842  TROPIPOC 0.13*     BNPNo results for input(s): BNP, PROBNP in the last 168 hours.   DDimer No results for input(s): DDIMER in the last 168 hours.   Radiology    No results found.  Cardiac Studies   05/12/16 Echo Study Conclusions  - Left ventricle: The cavity size was normal. Wall thickness was increased in a pattern of mild LVH. Systolic function was mildly to moderately reduced. The estimated ejection fraction was in the range of 40% to 45%. Diffuse hypokinesis. Doppler parameters are consistent with abnormal left ventricular relaxation (grade 1 diastolic dysfunction). - Mitral valve: There was mild regurgitation. - Pulmonary  arteries: Systolic pressure was mildly increased. PA peak pressure: 40 mm Hg (S).  Impressions:  - Mild to moderate global reduction in LV systolic function; grade 1 diastolic dysfunction; mild LVH; mild MR; trace TR with mildly elevated pulmonary pressure.  Patient Profile       66 y.o. female with a history of DM, L breast cancer s/p lumpectomy and 5 rounds of chemo in [place] and recent admission for PE and DVT at [facility] (04/24/16-05/03/16) presenting with sob, new Echo with decreased EF.  Assessment & Plan    Mild chronic systolic CHF:   -Echo this admission 05/12/16 showed 40-45% which reduced from 55-60% from 04/24/16. No RV dysfunction on recent echo however R sided heart strain on CTA of chest. Hx of angina but no cath or stress test in past.   -Now GI eval complete, planned for cath today.  -Weight up from 181 to 189 today. Has 1+ pedal edema and mild DOE with walking. I&O is -965 over 24h, but plus 656 since admission -Kidney function stable with cr 0.73 -Consider lasix after cath   PE and DVT  -reduced clot burden on CTA.   -Eliquis stopped pt on heparin.    Anemia  -with Hgb 8.0 on IV heparin post polypectomy, Hgb stable at 8.1 today -EGD normal on 05/16/16, per GI OK to proceed with cardiac workup  Hypomagnesemia -Serum mag 1.3 yesterday, magnesium 2 gm IV given, today mag-  1.6, has 2 gm magnesium ordered for today.  SignedDaune Perch, NP  05/18/2016, 11:23 AM   Pager: [phone]  Attending Note:   The patient was seen and examined.  Agree with assessment and plan as noted above.  Changes made to the above note as needed.  Patient seen and independently examined with Carrie Ades, NP .   We discussed all aspects of the encounter. I agree with the assessment and plan as stated above.  1.   CHF - for cath  today  Continue current meds.      I have spent a total of 40 minutes with patient reviewing hospital  notes , telemetry, EKGs, labs and examining  patient as well as establishing an assessment and plan that was discussed with the patient. > 50% of time was spent in direct patient care.    Carrie Watts, Carrie Watts., MD, [facility] 05/18/2016, 12:39 PM [address]  [place] Pager [phone]

## 2016-05-18 NOTE — Progress Notes (Signed)
Pt resting in bed, denied pain at this time. 

## 2016-05-18 NOTE — Progress Notes (Signed)
Spoke with patient about her diabetes. Patient was seen on 05/03/16 by one of the diabetes coordinators about the patient's new onset diabetes.  Takes Glucotrol and Metformin at home. Daughter was in the room with patient. States that patient does not want to check blood sugars every day. Encouraged patient to check blood sugars at least twice a day until she can see a PCP. Daughter states that patient will have an appointment with [facility] after being discharged. Patient had questions about diet and what she can eat. She is here in [place] from [place]. Hgba1C was 12.9% on 04/25/16. Wll have dietician to see patient and will order Living Well with Diabetes booklet. Patient could watch DM videos #[id number] while in the hospital. Is having a cardiac cath today. Will continue to monitor blood sugars while in the hospital. Harvel Ricks RN BSN CDE

## 2016-05-18 NOTE — Progress Notes (Signed)
[place] for Heparin Indication: hx DVT/PE/Afib  Allergies  Allergen Reactions  . Dust Mite Extract     Patient Measurements: Height: 5\' 2"  (157.5 cm) Weight: 189 lb 6.4 oz (85.9 kg) (scale a) IBW/kg (Calculated) : 50.1 Heparin Dosing Weight: 68.5kg  Vital Signs: Temp: 98.2 F (36.8 C) (02/07 0631) Temp Source: Oral (02/07 0631) BP: 131/81 (02/07 0631) Pulse Rate: 70 (02/07 0631)  Labs:  Recent Labs  05/16/16 0303 05/16/16 1947 05/17/16 0217 05/18/16 0526  HGB 8.1*  --  8.0* 8.1*  HCT 24.5*  --  25.3* 25.1*  PLT PLATELET CLUMPS NOTED ON SMEAR, UNABLE TO ESTIMATE  --  326 324  LABPROT  --   --   --  14.4  INR  --   --   --  1.11  HEPARINUNFRC 0.23* 0.13* 0.28* 0.45  CREATININE 0.80  --  0.73 0.73    Estimated Creatinine Clearance: 69.4 mL/min (by C-G formula based on SCr of 0.73 mg/dL).   Assessment: 66 y.o. female with h/o Afib/VTE, Eliquis on hold while awaiting cath today, continues on heparin Heparin level therapeutic this AM CBC stable  Goal of Therapy:  Heparin level 0.3-0.7 units/ml Monitor platelets by anticoagulation protocol: Yes   Plan:  Continue heparin at 900 units / hr Follow up after cath today -- resume Eliquis?  Thank you Anette Guarneri, PharmD [phone]  05/18/2016,9:56 AM

## 2016-05-18 NOTE — Progress Notes (Addendum)
[place] for Heparin Indication: hx DVT/PE/Afib  Allergies  Allergen Reactions  . Dust Mite Extract     Patient Measurements: Height: 5\' 2"  (157.5 cm) Weight: 189 lb 6.4 oz (85.9 kg) (scale a) IBW/kg (Calculated) : 50.1 Heparin Dosing Weight: 68.5kg  Vital Signs: Temp: 97.9 F (36.6 C) (02/07 1141) Temp Source: Oral (02/07 1141) BP: 134/77 (02/07 1442) Pulse Rate: 0 (02/07 1506)  Labs:  Recent Labs  05/16/16 0303 05/16/16 1947 05/17/16 0217 05/18/16 0526  HGB 8.1*  --  8.0* 8.1*  HCT 24.5*  --  25.3* 25.1*  PLT PLATELET CLUMPS NOTED ON SMEAR, UNABLE TO ESTIMATE  --  326 324  LABPROT  --   --   --  14.4  INR  --   --   --  1.11  HEPARINUNFRC 0.23* 0.13* 0.28* 0.45  CREATININE 0.80  --  0.73 0.73    Estimated Creatinine Clearance: 69.4 mL/min (by C-G formula based on SCr of 0.73 mg/dL).   Assessment: 66 y.o. female with h/o Afib/VTE, Eliquis on hold while awaiting cath today, continues on heparin Heparin level therapeutic this AM CBC stable  S/p cath --> negative for CAD, to resume heparin 6 hours post sheath  Goal of Therapy:  Heparin level 0.3-0.7 units/ml Monitor platelets by anticoagulation protocol: Yes   Plan:  Heparin at 900 units / hr starting at 2100 pm Resume Eliquis AM 2/8  Thank you Anette Guarneri, PharmD [phone]  05/18/2016,3:27 PM

## 2016-05-18 NOTE — H&P (View-Only) (Signed)
Progress Note  Patient Name: Carrie Watts Date of Encounter: 05/18/2016  Primary Cardiologist: Dr. Meda Coffee  Subjective   No chest pain, mild dyspnea with walking. Still has lower leg edema. Her left great toe "burst" from the fluid.  Inpatient Medications    Scheduled Meds: . sodium chloride   Intravenous Once  . anastrozole  1 mg Oral Daily  . atorvastatin  20 mg Oral QHS  . carvedilol  12.5 mg Oral BID WC  . famotidine  10 mg Oral Daily  . insulin aspart  0-5 Units Subcutaneous QHS  . insulin aspart  0-9 Units Subcutaneous TID WC  . magnesium sulfate 1 - 4 g bolus IVPB  2 g Intravenous Once  . pantoprazole  40 mg Oral BID AC  . sodium chloride flush  3 mL Intravenous Q12H   Continuous Infusions: . sodium chloride 1 mL/kg/hr (05/18/16 0655)  . heparin 900 Units/hr (05/18/16 0558)   PRN Meds: sodium chloride, HYDROcodone-acetaminophen, HYDROcodone-homatropine, morphine injection, ondansetron (ZOFRAN) IV, sodium chloride flush, traMADol   Vital Signs    Vitals:   05/17/16 1548 05/17/16 1956 05/18/16 0350 05/18/16 0631  BP: 130/79 131/62  131/81  Pulse: 90 79  70  Resp: 18 18  16   Temp: 98.3 F (36.8 C) 98 F (36.7 C)  98.2 F (36.8 C)  TempSrc: Oral Oral  Oral  SpO2: 92% 100%  95%  Weight:   189 lb 6.4 oz (85.9 kg)   Height:        Intake/Output Summary (Last 24 hours) at 05/18/16 1123 Last data filed at 05/18/16 0850  Gross per 24 hour  Intake              615 ml  Output              950 ml  Net             -335 ml   Filed Weights   05/16/16 0437 05/17/16 0514 05/18/16 0350  Weight: 185 lb 8 oz (84.1 kg) 187 lb 6.4 oz (85 kg) 189 lb 6.4 oz (85.9 kg)    Telemetry    SR 70's-80's with occ PVCs - Personally Reviewed  ECG    No new - Personally Reviewed  Physical Exam   GEN: No acute distress.   Neck: No JVD, sitting upright  Cardiac: RRR, no murmurs, rubs, or gallops.  Respiratory: Clear to auscultation bilaterally. GI: Soft, nontender,  non-distended  MS: 1+ pedal edema;  Lt gt toe with crack in skin, tender Neuro:  Nonfocal  Psych: Normal affect   Labs    Chemistry Recent Labs Lab 05/11/16 1507  05/16/16 0303 05/17/16 0217 05/18/16 0526  NA 137  < > 137 137 137  K 4.7  < > 3.6 3.6 3.6  CL  --   < > 107 108 106  CO2 22  < > 19* 21* 20*  GLUCOSE 114  < > 113* 157* 167*  BUN 11.6  < > 9 8 7   CREATININE 0.9  < > 0.80 0.73 0.73  CALCIUM 10.2  < > 9.3 9.1 8.8*  PROT 7.4  --   --   --   --   ALBUMIN 2.8*  --   --   --   --   AST 32  --   --   --   --   ALT 24  --   --   --   --   ALKPHOS 101  --   --   --   --  BILITOT 0.50  --   --   --   --   GFRNONAA  --   < > >60 >60 >60  GFRAA  --   < > >60 >60 >60  ANIONGAP 11  < > 11 8 11   < > = values in this interval not displayed.   Hematology  Recent Labs Lab 05/16/16 0303 05/17/16 0217 05/18/16 0526  WBC 7.5 8.2 7.5  RBC 2.62* 2.66* 2.60*  HGB 8.1* 8.0* 8.1*  HCT 24.5* 25.3* 25.1*  MCV 93.5 95.1 96.5  MCH 30.9 30.1 31.2  MCHC 33.1 31.6 32.3  RDW 16.7* 17.1* 17.6*  PLT PLATELET CLUMPS NOTED ON SMEAR, UNABLE TO ESTIMATE 326 324    Cardiac Enzymes  Recent Labs Lab 05/12/16 0454 05/13/16 0952 05/13/16 1349 05/13/16 2040  TROPONINI 0.10* 0.09* 0.09* 0.10*     Recent Labs Lab 05/11/16 1842  TROPIPOC 0.13*     BNPNo results for input(s): BNP, PROBNP in the last 168 hours.   DDimer No results for input(s): DDIMER in the last 168 hours.   Radiology    No results found.  Cardiac Studies   05/12/16 Echo Study Conclusions  - Left ventricle: The cavity size was normal. Wall thickness was increased in a pattern of mild LVH. Systolic function was mildly to moderately reduced. The estimated ejection fraction was in the range of 40% to 45%. Diffuse hypokinesis. Doppler parameters are consistent with abnormal left ventricular relaxation (grade 1 diastolic dysfunction). - Mitral valve: There was mild regurgitation. - Pulmonary  arteries: Systolic pressure was mildly increased. PA peak pressure: 40 mm Hg (S).  Impressions:  - Mild to moderate global reduction in LV systolic function; grade 1 diastolic dysfunction; mild LVH; mild MR; trace TR with mildly elevated pulmonary pressure.  Patient Profile       66 y.o. female with a history of DM, L breast cancer s/p lumpectomy and 5 rounds of chemo in [place] and recent admission for PE and DVT at [facility] (04/24/16-05/03/16) presenting with sob, new Echo with decreased EF.  Assessment & Plan    Mild chronic systolic CHF:   -Echo this admission 05/12/16 showed 40-45% which reduced from 55-60% from 04/24/16. No RV dysfunction on recent echo however R sided heart strain on CTA of chest. Hx of angina but no cath or stress test in past.   -Now GI eval complete, planned for cath today.  -Weight up from 181 to 189 today. Has 1+ pedal edema and mild DOE with walking. I&O is -965 over 24h, but plus 656 since admission -Kidney function stable with cr 0.73 -Consider lasix after cath   PE and DVT  -reduced clot burden on CTA.   -Eliquis stopped pt on heparin.    Anemia  -with Hgb 8.0 on IV heparin post polypectomy, Hgb stable at 8.1 today -EGD normal on 05/16/16, per GI OK to proceed with cardiac workup  Hypomagnesemia -Serum mag 1.3 yesterday, magnesium 2 gm IV given, today mag-  1.6, has 2 gm magnesium ordered for today.  SignedDaune Perch, NP  05/18/2016, 11:23 AM   Pager: [phone]  Attending Note:   The patient was seen and examined.  Agree with assessment and plan as noted above.  Changes made to the above note as needed.  Patient seen and independently examined with Pecolia Ades, NP .   We discussed all aspects of the encounter. I agree with the assessment and plan as stated above.  1.   CHF - for cath  today  Continue current meds.      I have spent a total of 40 minutes with patient reviewing hospital  notes , telemetry, EKGs, labs and examining  patient as well as establishing an assessment and plan that was discussed with the patient. > 50% of time was spent in direct patient care.    Thayer Headings, Brooke Bonito., MD, [facility] 05/18/2016, 12:39 PM [address],  [place] Pager [phone]

## 2016-05-18 NOTE — Interval H&P Note (Signed)
Cath Lab Visit (complete for each Cath Lab visit)  Clinical Evaluation Leading to the Procedure:   ACS: Yes.    Non-ACS:    Anginal Classification: CCS IV  Anti-ischemic medical therapy: Minimal Therapy (1 class of medications)  Non-Invasive Test Results: No non-invasive testing performed  Prior CABG: No previous CABG    Decreased EF.  PE as well. Mildly positive troponin.  History and Physical Interval Note:  05/18/2016 2:03 PM  Carrie Watts  has presented today for surgery, with the diagnosis of lv dysfunction, elevated triponin  The various methods of treatment have been discussed with the patient and family. After consideration of risks, benefits and other options for treatment, the patient has consented to  Procedure(s): Left Heart Cath and Coronary Angiography (N/A) as a surgical intervention .  The patient's history has been reviewed, patient examined, no change in status, stable for surgery.  I have reviewed the patient's chart and labs.  Questions were answered to the patient's satisfaction.     Larae Grooms

## 2016-05-18 NOTE — Progress Notes (Addendum)
PROGRESS NOTE    Carrie Watts  [id number] DOB: [birth year] DOA: 05/11/2016 PCP: No PCP Per Patient     Brief Narrative:  66 y.o.BF PMHx CVA,  Atrial fibrillation ,Grade 2, mixed infiltrating lobular and ductal, involving multiple quadrants Left Breast cancer S/P left mastectomy and axillary lymph node dissection at [facility] in [place] , [place] chemotherapy), Saddle PE and DVT (Dx 1/14), new DM Type 2, HLD, Renal disorder, who presented with chest pain. The patient developed chest pain and stroke-like symptoms about two weeks before she flew to the [place] to visit her daughter at the beginning of January. She was admitted here on 1/14 and diagnosed with new saddle PE with right heart strain and started on Lovenox, transitioned to apixaban at discharge.She has been taking her apixaban and is taking it as prescribed (although she told pharmacy tech she had just transitioned to *once* daily dosing). Since discharge, she has had several flares of severe left sided chest pain, sharp and severe, worse with coughing or deep breathing and associated with feeling she can't catch her breath. Due to dyspnea she underwent a repeat CT angiogram this admission which showed decreased clot burden. 2-D echo showed depressed ejection fraction with diffuse hypokinesis, and cardiology was consulted.  Assessment & Plan:  Acute hypoxic respiratory failure / chest pain - Multifactorial, likely due to pulmonary embolism as well as acute systolic CHF, mild pulmonary hypertension - Wean off oxygen as tolerated  Acute systolic CHF - Cardiac catheterization planned today - Continue Coreg, Lipitor - ?related to doxorubucin which she apparently received in December.  Acute saddle PE (Dx 1/14) - SSx improved  - On Eliquis at home, currently on IV heparin anticipation for cardiac catheterization  Anemia in the setting of acute illness and positive fecal occult - GI consulted by cardiology - EGD done  2.5 and unremarkable study, recommendations is to continue monitor for signs of bleeding, OK to resume AC, OK to proceed with cardiac work up  - CBC in AM, hemoglobin has remained stable on heparin infusion  DM type 2 uncontrolled with complication  - 0000000 Hemoglobin A1c =12.9  - Hold oral hypoglycemic agents during this hospitalization. - diabetic educator consulted  - Sensitive SSI for now  Hypomagnesemia - supplement and repeat Mg in AM - Needs ongoing supplementation today  Pulmonary hypertension  GERD/Hiatial hernia - PPI to twice a day. Continue Pepcid.  Grade 2, mixed infiltrating lobular and ductal, involving multiple quadrants Left Breast cancer  - Continue Arimidex. Outpatient follow-up.    DVT prophylaxis: Heparin drip Code Status: Full Family Communication: No family at bedside Disposition Plan: home post cath   Consultants:  Cardiology GI   Procedures/Significant Events:  1/14 CT angiogram chest W contrast (previous admission):Positive PE ncluding saddle embolus and bilateral lobar and segmentale.- Moderately large clot burden. Positive for acute CT evidence of right heart strain (RV/LV Ratio = 1.19)  -Patchy nodular ground-glass infiltrative changes in the lungs may be due to multifocal pneumonia, edema, or infarcts. -Metastatic disease would be a less likely consideration but due to history of primary carcinoma, non-contrast chest CT at 3-6 months is recommended.  2/1 Echocardiogram:Left ventricle:  mild LVH. -LVEF = 40% to 45%. Diffuse hypokinesis. - (grade 1 diastolic dysfunction). - Mitral valve: There was mild regurgitation. - Pulmonary arteries: . PA  peak pressure: 40 mm Hg (S).  Subjective: - no chest pain, shortness of breath, no abdominal pain, nausea or vomiting. Awaiting cath   Objective: Vitals:  05/17/16 1548 05/17/16 1956 05/18/16 0350 05/18/16 0631  BP: 130/79 131/62  131/81  Pulse: 90 79  70  Resp: 18 18  16   Temp: 98.3 F (36.8  C) 98 F (36.7 C)  98.2 F (36.8 C)  TempSrc: Oral Oral  Oral  SpO2: 92% 100%  95%  Weight:   85.9 kg (189 lb 6.4 oz)   Height:        Intake/Output Summary (Last 24 hours) at 05/18/16 1035 Last data filed at 05/18/16 0850  Gross per 24 hour  Intake              615 ml  Output             1070 ml  Net             -455 ml   Filed Weights   05/16/16 0437 05/17/16 0514 05/18/16 0350  Weight: 84.1 kg (185 lb 8 oz) 85 kg (187 lb 6.4 oz) 85.9 kg (189 lb 6.4 oz)    Examination:  General: A/O 4, NAD, No acute respiratory distress Lungs: Clear to auscultation bilaterally without wheezes or crackles Cardiovascular: Regular rate and rhythm without murmur gallop or rub normal S1 and S2. Abdomen: negative abdominal pain, nondistended, positive soft, bowel sounds, no rebound, no ascites, no appreciable mass Extremities: No significant cyanosis, clubbing, or edema bilateral lower extremities Skin: Negative rashes, lesions, ulcers   Data Reviewed: Care during the described time interval was provided by me .   CBC:  Recent Labs Lab 05/11/16 1507  05/14/16 0353 05/15/16 0418 05/16/16 0303 05/17/16 0217 05/18/16 0526  WBC 7.7  < > 7.7 7.8 7.5 8.2 7.5  NEUTROABS 5.6  --   --   --   --   --   --   HGB 9.3*  < > 8.2* 8.4* 8.1* 8.0* 8.1*  HCT 29.4*  < > 25.7* 26.6* 24.5* 25.3* 25.1*  MCV 96.7  < > 95.9 95.3 93.5 95.1 96.5  PLT 313  < > 327 383 PLATELET CLUMPS NOTED ON SMEAR, UNABLE TO ESTIMATE 326 324  < > = values in this interval not displayed. Basic Metabolic Panel:  Recent Labs Lab 05/14/16 0353 05/15/16 0418 05/16/16 0303 05/17/16 0217 05/18/16 0526  NA 136 136 137 137 137  K 4.0 3.6 3.6 3.6 3.6  CL 105 105 107 108 106  CO2 22 23 19* 21* 20*  GLUCOSE 158* 159* 113* 157* 167*  BUN 10 13 9 8 7   CREATININE 0.87 0.90 0.80 0.73 0.73  CALCIUM 9.0 9.2 9.3 9.1 8.8*  MG  --  1.5* 1.4* 1.3* 1.6*   Liver Function Tests:  Recent Labs Lab 05/11/16 1507  AST 32  ALT 24    ALKPHOS 101  BILITOT 0.50  PROT 7.4  ALBUMIN 2.8*   Coagulation Profile:  Recent Labs Lab 05/11/16 2028 05/18/16 0526  INR 1.44 1.11   Cardiac Enzymes:  Recent Labs Lab 05/12/16 0454 05/13/16 0952 05/13/16 1349 05/13/16 2040  TROPONINI 0.10* 0.09* 0.09* 0.10*   CBG:  Recent Labs Lab 05/17/16 0727 05/17/16 1151 05/17/16 1639 05/17/16 2218 05/18/16 0744  GLUCAP 143* 250* 150* 166* 135*   Radiology Studies: No results found.  Scheduled Meds: . sodium chloride   Intravenous Once  . anastrozole  1 mg Oral Daily  . atorvastatin  20 mg Oral QHS  . carvedilol  12.5 mg Oral BID WC  . famotidine  10 mg Oral Daily  . insulin aspart  0-5  Units Subcutaneous QHS  . insulin aspart  0-9 Units Subcutaneous TID WC  . pantoprazole  40 mg Oral BID AC  . sodium chloride flush  3 mL Intravenous Q12H   Continuous Infusions: . sodium chloride 1 mL/kg/hr (05/18/16 0655)  . heparin 900 Units/hr (05/18/16 0558)     LOS: 6 days    Marzetta Board, MD [facility] Pager [phone]  If 7PM-7AM, please contact night-coverage [email or web address] Password [email or web address] 05/18/2016, 10:35 AM

## 2016-05-19 ENCOUNTER — Inpatient Hospital Stay (HOSPITAL_COMMUNITY): Payer: Medicare Other

## 2016-05-19 ENCOUNTER — Encounter (HOSPITAL_COMMUNITY): Payer: Self-pay | Admitting: Interventional Cardiology

## 2016-05-19 DIAGNOSIS — M25572 Pain in left ankle and joints of left foot: Secondary | ICD-10-CM | POA: Diagnosis not present

## 2016-05-19 DIAGNOSIS — M7989 Other specified soft tissue disorders: Secondary | ICD-10-CM | POA: Diagnosis not present

## 2016-05-19 DIAGNOSIS — I5023 Acute on chronic systolic (congestive) heart failure: Secondary | ICD-10-CM | POA: Diagnosis not present

## 2016-05-19 DIAGNOSIS — J9601 Acute respiratory failure with hypoxia: Secondary | ICD-10-CM | POA: Diagnosis not present

## 2016-05-19 DIAGNOSIS — I248 Other forms of acute ischemic heart disease: Secondary | ICD-10-CM | POA: Diagnosis not present

## 2016-05-19 DIAGNOSIS — R0902 Hypoxemia: Secondary | ICD-10-CM | POA: Diagnosis not present

## 2016-05-19 DIAGNOSIS — I429 Cardiomyopathy, unspecified: Secondary | ICD-10-CM | POA: Diagnosis not present

## 2016-05-19 DIAGNOSIS — I2602 Saddle embolus of pulmonary artery with acute cor pulmonale: Secondary | ICD-10-CM | POA: Diagnosis not present

## 2016-05-19 DIAGNOSIS — R079 Chest pain, unspecified: Secondary | ICD-10-CM | POA: Diagnosis not present

## 2016-05-19 DIAGNOSIS — E1165 Type 2 diabetes mellitus with hyperglycemia: Secondary | ICD-10-CM | POA: Diagnosis not present

## 2016-05-19 LAB — CBC
HCT: 26.4 % — ABNORMAL LOW (ref 36.0–46.0)
Hemoglobin: 8.1 g/dL — ABNORMAL LOW (ref 12.0–15.0)
MCH: 29.9 pg (ref 26.0–34.0)
MCHC: 30.7 g/dL (ref 30.0–36.0)
MCV: 97.4 fL (ref 78.0–100.0)
Platelets: 326 10*3/uL (ref 150–400)
RBC: 2.71 MIL/uL — ABNORMAL LOW (ref 3.87–5.11)
RDW: 17.8 % — AB (ref 11.5–15.5)
WBC: 7.5 10*3/uL (ref 4.0–10.5)

## 2016-05-19 LAB — HEPATITIS PANEL, ACUTE
HEP B C IGM: NEGATIVE
HEP B S AG: NEGATIVE
Hep A IgM: NEGATIVE

## 2016-05-19 LAB — BASIC METABOLIC PANEL
Anion gap: 11 (ref 5–15)
BUN: 6 mg/dL (ref 6–20)
CO2: 20 mmol/L — ABNORMAL LOW (ref 22–32)
CREATININE: 0.79 mg/dL (ref 0.44–1.00)
Calcium: 9 mg/dL (ref 8.9–10.3)
Chloride: 109 mmol/L (ref 101–111)
GFR calc Af Amer: >60 mL/min (ref >60)
GLUCOSE: 164 mg/dL — AB (ref 65–99)
Potassium: 3.9 mmol/L (ref 3.5–5.1)
SODIUM: 140 mmol/L (ref 135–145)

## 2016-05-19 LAB — GLUCOSE, CAPILLARY
GLUCOSE-CAPILLARY: 139 mg/dL — AB (ref 65–99)
Glucose-Capillary: 205 mg/dL — ABNORMAL HIGH (ref 65–99)
Glucose-Capillary: 190 mg/dL — ABNORMAL HIGH (ref 65–99)
Glucose-Capillary: 222 mg/dL — ABNORMAL HIGH (ref 65–99)

## 2016-05-19 LAB — MAGNESIUM: MAGNESIUM: 1.7 mg/dL (ref 1.7–2.4)

## 2016-05-19 LAB — HEPARIN LEVEL (UNFRACTIONATED): HEPARIN UNFRACTIONATED: 0.45 [IU]/mL (ref 0.30–0.70)

## 2016-05-19 LAB — URIC ACID: Uric Acid, Serum: 7.2 mg/dL — ABNORMAL HIGH (ref 2.3–6.6)

## 2016-05-19 MED ORDER — PREDNISONE 20 MG PO TABS
40.0000 mg | ORAL_TABLET | Freq: Every day | ORAL | Status: DC
  Administered 2016-05-19 – 2016-05-22 (×4): 40 mg via ORAL
  Filled 2016-05-19 (×4): qty 2

## 2016-05-19 MED ORDER — LISINOPRIL 2.5 MG PO TABS
2.5000 mg | ORAL_TABLET | Freq: Every day | ORAL | Status: DC
  Filled 2016-05-19: qty 1

## 2016-05-19 MED ORDER — COLCHICINE 0.6 MG PO TABS
0.6000 mg | ORAL_TABLET | Freq: Every day | ORAL | Status: DC
  Administered 2016-05-19 – 2016-05-22 (×4): 0.6 mg via ORAL
  Filled 2016-05-19 (×4): qty 1

## 2016-05-19 MED ORDER — APIXABAN 5 MG PO TABS
5.0000 mg | ORAL_TABLET | Freq: Two times a day (BID) | ORAL | Status: DC
  Administered 2016-05-19 – 2016-05-22 (×7): 5 mg via ORAL
  Filled 2016-05-19 (×7): qty 1

## 2016-05-19 MED ORDER — LOSARTAN POTASSIUM 50 MG PO TABS
50.0000 mg | ORAL_TABLET | Freq: Every day | ORAL | Status: DC
  Administered 2016-05-19 – 2016-05-22 (×4): 50 mg via ORAL
  Filled 2016-05-19 (×4): qty 1

## 2016-05-19 MED ORDER — FUROSEMIDE 10 MG/ML IJ SOLN
40.0000 mg | Freq: Every day | INTRAMUSCULAR | Status: DC
  Administered 2016-05-19 – 2016-05-20 (×2): 40 mg via INTRAVENOUS
  Filled 2016-05-19 (×2): qty 4

## 2016-05-19 NOTE — Progress Notes (Addendum)
  RD consulted for nutrition education regarding diabetes.   Lab Results  Component Value Date   HGBA1C 12.9 (H) 04/25/2016    RD provided "Type 2 Diabetes Nutrition Therapy" handout from the Academy of [employer]. Reviewed patient's diet recall and provided recommendations. Pt difficult to assess due to diet in [place] being different from diet since coming to [place]. Discussed different food groups and their effects on blood sugar, emphasizing carbohydrate-containing foods. Provided list of carbohydrates and recommended serving sizes of common foods.  Discussed importance of controlled and consistent carbohydrate intake throughout the day. Provided examples of ways to balance meals/snacks and encouraged intake of high-fiber, whole grain complex carbohydrates. Teach back method used.  Expect very good compliance. Pt able to recognize the need to decrease intake of fruit, fruit juice, and sugar and to decrease portion sizes of rice, bread, and potatoes. Pt states that she used to eat a pound of bananas every 1-2 days. RD assisted patient in planning 3 meals and one snack with 60 grams of carbohydrate at meals and 20 grams of carbohydrate at snack. Pt used to put salt on all of her fresh fruit, but recognizes that she should stop doing this.   Body mass index is 34.24 kg/m. Pt meets criteria for Obesity based on current BMI. Pt states that due to recent illness she has lost from 210 lbs to 170 lbs, but has already started regaining. She reports that she had taste changes and poor appetite while undergoing chemotherapy, but her appetite and taste are improving.   Current diet order is Heart Healthy/Carb Modified, patient is consuming approximately 75-100% of most meals at this time. Labs and medications reviewed. No further nutrition interventions warranted at this time. Pt would like RD to talk with her daughter. RD contact information provided. Encouraged patient to request return visit of  nutrition staff via RN when daughter is present or have daughter contact RD via phone. Approximately 40 minutes spent educating patient.   Scarlette Ar RD, CSP, LDN Inpatient Clinical Dietitian Pager: [phone] After Hours Pager: [phone]

## 2016-05-19 NOTE — Progress Notes (Signed)
Pt complained of left ankle pain, no numbness, 2+ pedal pulse on assessment, norco 1 tab given. Tylene Fantasia NP notified. Will continue to monitor.

## 2016-05-19 NOTE — Progress Notes (Signed)
Progress Note  Patient Name: Carrie Watts Date of Encounter: 05/19/2016  Primary Cardiologist: Dr. Meda Coffee  Subjective   No chest pain, mild dyspnea with walking. Complaining of right ankle pain worse last night requiring pain med. This morning is worse with weight bearing.  Inpatient Medications    Scheduled Meds: . sodium chloride   Intravenous Once  . anastrozole  1 mg Oral Daily  . atorvastatin  20 mg Oral QHS  . carvedilol  12.5 mg Oral BID WC  . famotidine  10 mg Oral Daily  . insulin aspart  0-5 Units Subcutaneous QHS  . insulin aspart  0-9 Units Subcutaneous TID WC  . pantoprazole  40 mg Oral BID AC  . sodium chloride flush  3 mL Intravenous Q12H   Continuous Infusions: . heparin 900 Units/hr (05/18/16 2110)   PRN Meds: sodium chloride, acetaminophen, HYDROcodone-acetaminophen, HYDROcodone-homatropine, morphine injection, ondansetron (ZOFRAN) IV, sodium chloride flush, traMADol   Vital Signs    Vitals:   05/18/16 1442 05/18/16 1447 05/19/16 0038 05/19/16 0510  BP: 134/77  139/82 126/72  Pulse: 72 (!) 0 79 80  Resp: (!) 28 (!) 0 18 18  Temp:   98.4 F (36.9 C) 98 F (36.7 C)  TempSrc:   Oral Oral  SpO2: (!) 0% (!) 0% 100% 100%  Weight:    187 lb 3.2 oz (84.9 kg)  Height:        Intake/Output Summary (Last 24 hours) at 05/19/16 1017 Last data filed at 05/19/16 0600  Gross per 24 hour  Intake              507 ml  Output              850 ml  Net             -343 ml   Filed Weights   05/17/16 0514 05/18/16 0350 05/19/16 0510  Weight: 187 lb 6.4 oz (85 kg) 189 lb 6.4 oz (85.9 kg) 187 lb 3.2 oz (84.9 kg)    Telemetry    SR 60's-80's  - Personally Reviewed  ECG    No new - Personally Reviewed  Physical Exam   GEN: No acute distress.   Neck: No JVD, sitting upright  Cardiac: RRR, no murmurs, rubs, or gallops.  Respiratory: Clear to auscultation bilaterally. GI: Soft, nontender, non-distended  MS: trace pedal edema;  Lt gt toe with crack in  skin, tender Neuro:  Nonfocal  Psych: Normal affect   Left wrist cath site with bandage, no drainage, no edema, no pain  Labs    Chemistry  Recent Labs Lab 05/17/16 0217 05/18/16 0526 05/19/16 0539  NA 137 137 140  K 3.6 3.6 3.9  CL 108 106 109  CO2 21* 20* 20*  GLUCOSE 157* 167* 164*  BUN 8 7 6   CREATININE 0.73 0.73 0.79  CALCIUM 9.1 8.8* 9.0  GFRNONAA >60 >60 >60  GFRAA >60 >60 >60  ANIONGAP 8 11 11      Hematology  Recent Labs Lab 05/17/16 0217 05/18/16 0526 05/19/16 0539  WBC 8.2 7.5 7.5  RBC 2.66* 2.60* 2.71*  HGB 8.0* 8.1* 8.1*  HCT 25.3* 25.1* 26.4*  MCV 95.1 96.5 97.4  MCH 30.1 31.2 29.9  MCHC 31.6 32.3 30.7  RDW 17.1* 17.6* 17.8*  PLT 326 324 326    Cardiac Enzymes  Recent Labs Lab 05/13/16 0952 05/13/16 1349 05/13/16 2040  TROPONINI 0.09* 0.09* 0.10*    No results for input(s): TROPIPOC in the  last 168 hours.   BNPNo results for input(s): BNP, PROBNP in the last 168 hours.   DDimer No results for input(s): DDIMER in the last 168 hours.   Radiology    No results found.  Cardiac Studies   05/19/2016 Cardiac cath  Mid RCA lesion, 10 %stenosed.  Mid LAD lesion, 40 %stenosed.  Mid Cx lesion, 10 %stenosed.  The left ventricular ejection fraction is 40-45% by visual estimate.  There is mild left ventricular systolic dysfunction.  Vasospasm noted in the right radial artery.  LV end diastolic pressure is normal, 11 mm Hg. Nonobstructive CAD.  Plan for medical therapy and treatment of PE and anemia.    Coronary Diagrams       ____________________________________________________________________________________  05/12/16 Echo Study Conclusions  - Left ventricle: The cavity size was normal. Wall thickness was increased in a pattern of mild LVH. Systolic function was mildly to moderately reduced. The estimated ejection fraction was in the range of 40% to 45%. Diffuse hypokinesis. Doppler parameters are consistent with  abnormal left ventricular relaxation (grade 1 diastolic dysfunction). - Mitral valve: There was mild regurgitation. - Pulmonary arteries: Systolic pressure was mildly increased. PA peak pressure: 40 mm Hg (S).  Impressions:  - Mild to moderate global reduction in LV systolic function; grade 1 diastolic dysfunction; mild LVH; mild MR; trace TR with mildly elevated pulmonary pressure.  Patient Profile     66 y.o. female with a history of DM, L breast cancer s/p lumpectomy and 5 rounds of chemo in [place] and recent admission for PE and DVT at [facility] (04/24/16-05/03/16) presenting with sob, new Echo with decreased EF.  Assessment & Plan    Mild chronic systolic CHF:   -Echo this admission 05/12/16 showed 40-45% which reduced from 55-60% from 04/24/16. No RV dysfunction on recent echo however R sided heart strain on CTA of chest. Hx of angina but no cath or stress test in past.   -Now GI eval complete -Cath 05/18/2016 showed non-obstructive coronary disease (see above) -Weight up from 181 to 187 today. Has 1+ pedal edema and mild DOE with walking. I&O is -343 over 24h,  plus 313 since admission -Kidney function stable with cr 0.79. Add ACE-I and lasix.  PE and DVT  -reduced clot burden on CTA.   -Eliquis stopped pt on heparin. Now cath and GI procedures complete, should be able to dc heparin and restart Eliquis   Anemia  -with Hgb 8.0 on IV heparin post polypectomy, Hgb stable at 8.1 today -EGD normal on 05/16/16, per GI OK to proceed with cardiac workup  Hypomagnesemia -Serum mag 1.3, has received mag 2 GM IV  X 2; current mag level 1.7  Right ankle pain -?Gout- will uric acid -Defer to IM  Signed, [name], NP  05/19/2016, 10:17 AM   Pager: [phone]  Attending Note:   The patient was seen and examined.  Agree with assessment and plan as noted above.  Changes made to the above note as needed.  Patient seen and independently examined with Pecolia Ades, NP .   We  discussed all aspects of the encounter. I agree with the assessment and plan as stated above.  1. Chronic systolic CHF:  Normal cors by cath. I suspect this is related to adriamycin She has only mild CHF.    She should be set up to see Dr. Haroldine Laws in the CHF clinic as he follows this population of patient s We do not know the total dose of adriamycin that  she received.  Agree with more diuresis for now  Will change the Lisinopril to Losartan 50 mg a day     I have spent a total of 40 minutes with patient reviewing hospital  notes , telemetry, EKGs, labs and examining patient as well as establishing an assessment and plan that was discussed with the patient. > 50% of time was spent in direct patient care.    Thayer Headings, Brooke Bonito., MD, [facility] 05/19/2016, 12:57 PM [address],  [place] Pager [phone]

## 2016-05-19 NOTE — Progress Notes (Signed)
PROGRESS NOTE    Carrie Watts  [id number] DOB: [birth year] DOA: 05/11/2016 PCP: No PCP Per Patient     Brief Narrative:  66 y.o.BF PMHx CVA,  Atrial fibrillation ,Grade 2, mixed infiltrating lobular and ductal, involving multiple quadrants Left Breast cancer S/P left mastectomy and axillary lymph node dissection at [facility] in [place] , [place] chemotherapy), Saddle PE and DVT (Dx 1/14), new DM Type 2, HLD, Renal disorder, who presented with chest pain. The patient developed chest pain and stroke-like symptoms about two weeks before she flew to the [place] to visit her daughter at the beginning of January. She was admitted here on 1/14 and diagnosed with new saddle PE with right heart strain and started on Lovenox, transitioned to apixaban at discharge.She has been taking her apixaban and is taking it as prescribed (although she told pharmacy tech she had just transitioned to *once* daily dosing). Since discharge, she has had several flares of severe left sided chest pain, sharp and severe, worse with coughing or deep breathing and associated with feeling she can't catch her breath. Due to dyspnea she underwent a repeat CT angiogram this admission which showed decreased clot burden. 2-D echo showed depressed ejection fraction with diffuse hypokinesis, and cardiology was consulted.  Assessment & Plan:  Acute hypoxic respiratory failure / chest pain - Multifactorial, likely due to pulmonary embolism as well as acute systolic CHF, mild pulmonary hypertension - Wean off oxygen as tolerated, this morning she is on room air  Acute systolic CHF - Cardiac catheterization 2/7 without significant obstruction - Continue Coreg, Lipitor - ?related to doxorubucin which she apparently received in December. - add ACEI - slight fluid overloaded, give Lasix IV today   Acute saddle PE (Dx 1/14) - SSx improved  - On Eliquis at home, resume today. Stop heparin infusion  Anemia in the  setting of acute illness and positive fecal occult - GI consulted by cardiology - EGD done 2.5 and unremarkable study, recommendations is to continue monitor for signs of bleeding, OK to resume AC, OK to proceed with cardiac work up  - CBC in AM, hemoglobin has remained stable on anticoagulation  DM type 2 uncontrolled with complication  - 0000000 Hemoglobin A1c =12.9  - Hold oral hypoglycemic agents during this hospitalization. - diabetic educator consulted  - Sensitive SSI for now  Hypomagnesemia - supplement and repeat Mg in AM - normal this morning  Pulmonary hypertension  GERD/Hiatial hernia - PPI to twice a day. Continue Pepcid.  Grade 2, mixed infiltrating lobular and ductal, involving multiple quadrants Left Breast cancer  - Continue Arimidex. Outpatient follow-up.    DVT prophylaxis: Heparin drip Code Status: Full Family Communication: No family at bedside Disposition Plan: home post cath   Consultants:  Cardiology GI   Procedures/Significant Events:  1/14 CT angiogram chest W contrast (previous admission):Positive PE ncluding saddle embolus and bilateral lobar and segmentale.- Moderately large clot burden. Positive for acute CT evidence of right heart strain (RV/LV Ratio = 1.19)  -Patchy nodular ground-glass infiltrative changes in the lungs may be due to multifocal pneumonia, edema, or infarcts. -Metastatic disease would be a less likely consideration but due to history of primary carcinoma, non-contrast chest CT at 3-6 months is recommended.  2/1 Echocardiogram:Left ventricle:  mild LVH. -LVEF = 40% to 45%. Diffuse hypokinesis. - (grade 1 diastolic dysfunction). - Mitral valve: There was mild regurgitation. - Pulmonary arteries: . PA  peak pressure: 40 mm Hg (S).  Subjective: - no chest pain,  shortness of breath, no abdominal pain, nausea or vomiting. Awaiting cath   Objective: Vitals:   05/18/16 1447 05/19/16 0038 05/19/16 0510 05/19/16 1148  BP:  139/82  126/72 124/60  Pulse: (!) 0 79 80 76  Resp: (!) 0 18 18 (!) 189  Temp:  98.4 F (36.9 C) 98 F (36.7 C) 97.8 F (36.6 C)  TempSrc:  Oral Oral Oral  SpO2: (!) 0% 100% 100% 100%  Weight:   84.9 kg (187 lb 3.2 oz)   Height:        Intake/Output Summary (Last 24 hours) at 05/19/16 1302 Last data filed at 05/19/16 1148  Gross per 24 hour  Intake              747 ml  Output             1050 ml  Net             -303 ml   Filed Weights   05/17/16 0514 05/18/16 0350 05/19/16 0510  Weight: 85 kg (187 lb 6.4 oz) 85.9 kg (189 lb 6.4 oz) 84.9 kg (187 lb 3.2 oz)    Examination:  General: A/O 4, NAD, No acute respiratory distress Lungs: Clear to auscultation bilaterally without wheezes or crackles Cardiovascular: Regular rate and rhythm without murmur gallop or rub normal S1 and S2. 1-2+ LE edema Abdomen: negative abdominal pain, nondistended, positive soft, bowel sounds, no rebound, no ascites, no appreciable mass Extremities: No significant cyanosis, clubbing, or edema bilateral lower extremities. Significant LLE ankle tenderness with movement Skin: Negative rashes, lesions, ulcers   Data Reviewed: Care during the described time interval was provided by me .   CBC:  Recent Labs Lab 05/15/16 0418 05/16/16 0303 05/17/16 0217 05/18/16 0526 05/19/16 0539  WBC 7.8 7.5 8.2 7.5 7.5  HGB 8.4* 8.1* 8.0* 8.1* 8.1*  HCT 26.6* 24.5* 25.3* 25.1* 26.4*  MCV 95.3 93.5 95.1 96.5 97.4  PLT 383 PLATELET CLUMPS NOTED ON SMEAR, UNABLE TO ESTIMATE 326 324 [id number]   Basic Metabolic Panel:  Recent Labs Lab 05/15/16 0418 05/16/16 0303 05/17/16 0217 05/18/16 0526 05/19/16 0539  NA 136 137 137 137 140  K 3.6 3.6 3.6 3.6 3.9  CL 105 107 108 106 109  CO2 23 19* 21* 20* 20*  GLUCOSE 159* 113* 157* 167* 164*  BUN 13 9 8 7 6   CREATININE 0.90 0.80 0.73 0.73 0.79  CALCIUM 9.2 9.3 9.1 8.8* 9.0  MG 1.5* 1.4* 1.3* 1.6* 1.7   Liver Function Tests: No results for input(s): AST, ALT, ALKPHOS,  BILITOT, PROT, ALBUMIN in the last 168 hours. Coagulation Profile:  Recent Labs Lab 05/18/16 0526  INR 1.11   Cardiac Enzymes:  Recent Labs Lab 05/13/16 0952 05/13/16 1349 05/13/16 2040  TROPONINI 0.09* 0.09* 0.10*   CBG:  Recent Labs Lab 05/18/16 1139 05/18/16 1645 05/18/16 2137 05/19/16 0736 05/19/16 1147  GLUCAP 150* 113* 158* 139* 205*   Radiology Studies: No results found.  Scheduled Meds: . sodium chloride   Intravenous Once  . anastrozole  1 mg Oral Daily  . apixaban  5 mg Oral BID  . atorvastatin  20 mg Oral QHS  . carvedilol  12.5 mg Oral BID WC  . famotidine  10 mg Oral Daily  . furosemide  40 mg Intravenous Daily  . insulin aspart  0-5 Units Subcutaneous QHS  . insulin aspart  0-9 Units Subcutaneous TID WC  . lisinopril  2.5 mg Oral Daily  . pantoprazole  40  mg Oral BID AC  . predniSONE  40 mg Oral Q breakfast  . sodium chloride flush  3 mL Intravenous Q12H   Continuous Infusions:    LOS: 7 days    Marzetta Board, MD [facility] Pager [phone]  If 7PM-7AM, please contact night-coverage [email or web address] Password [email or web address] 05/19/2016, 1:02 PM

## 2016-05-19 NOTE — Progress Notes (Signed)
[facility] for Heparin Indication: hx DVT/PE/Afib  Allergies  Allergen Reactions  . Dust Mite Extract     Patient Measurements: Height: 5\' 2"  (157.5 cm) Weight: 187 lb 3.2 oz (84.9 kg) IBW/kg (Calculated) : 50.1 Heparin Dosing Weight: 68.5kg  Vital Signs: Temp: 98 F (36.7 C) (02/08 0510) Temp Source: Oral (02/08 0510) BP: 126/72 (02/08 0510) Pulse Rate: 80 (02/08 0510)  Labs:  Recent Labs  05/17/16 0217 05/18/16 0526 05/19/16 0539  HGB 8.0* 8.1* 8.1*  HCT 25.3* 25.1* 26.4*  PLT 326 324 326  LABPROT  --  14.4  --   INR  --  1.11  --   HEPARINUNFRC 0.28* 0.45 0.45  CREATININE 0.73 0.73 0.79    Estimated Creatinine Clearance: 68.9 mL/min (by C-G formula based on SCr of 0.79 mg/dL).   Assessment: 66 y.o. female with h/o Afib/VTE, Eliquis on hold Now s/p cath, continues on heparin Heparin level therapeutic this AM CBC stable  Goal of Therapy:  Heparin level 0.3-0.7 units/ml Monitor platelets by anticoagulation protocol: Yes   Plan:  Continue heparin at 900 units / hr Follow up AM labs Resume Eliquis?  Thank you Anette Guarneri, PharmD [phone]  05/19/2016,9:52 AM

## 2016-05-20 ENCOUNTER — Ambulatory Visit
Admission: RE | Admit: 2016-05-20 | Discharge: 2016-05-20 | Disposition: A | Discharge status: Home / Self Care | Payer: Medicare Other | Source: Ambulatory Visit | Attending: Radiation Oncology | Admitting: Radiation Oncology

## 2016-05-20 ENCOUNTER — Ambulatory Visit: Payer: Medicare Other

## 2016-05-20 DIAGNOSIS — I5021 Acute systolic (congestive) heart failure: Secondary | ICD-10-CM | POA: Diagnosis not present

## 2016-05-20 DIAGNOSIS — R079 Chest pain, unspecified: Secondary | ICD-10-CM | POA: Diagnosis not present

## 2016-05-20 DIAGNOSIS — I429 Cardiomyopathy, unspecified: Secondary | ICD-10-CM | POA: Diagnosis not present

## 2016-05-20 DIAGNOSIS — I5023 Acute on chronic systolic (congestive) heart failure: Secondary | ICD-10-CM | POA: Diagnosis not present

## 2016-05-20 DIAGNOSIS — I248 Other forms of acute ischemic heart disease: Secondary | ICD-10-CM | POA: Diagnosis not present

## 2016-05-20 DIAGNOSIS — E1165 Type 2 diabetes mellitus with hyperglycemia: Secondary | ICD-10-CM | POA: Diagnosis not present

## 2016-05-20 DIAGNOSIS — J9601 Acute respiratory failure with hypoxia: Secondary | ICD-10-CM | POA: Diagnosis not present

## 2016-05-20 DIAGNOSIS — I2602 Saddle embolus of pulmonary artery with acute cor pulmonale: Secondary | ICD-10-CM | POA: Diagnosis not present

## 2016-05-20 DIAGNOSIS — R0902 Hypoxemia: Secondary | ICD-10-CM | POA: Diagnosis not present

## 2016-05-20 LAB — BASIC METABOLIC PANEL
Anion gap: 10 (ref 5–15)
BUN: 10 mg/dL (ref 6–20)
CHLORIDE: 105 mmol/L (ref 101–111)
CO2: 20 mmol/L — AB (ref 22–32)
CREATININE: 0.76 mg/dL (ref 0.44–1.00)
Calcium: 9.4 mg/dL (ref 8.9–10.3)
GFR calc Af Amer: >60 mL/min (ref >60)
GFR calc non Af Amer: >60 mL/min (ref >60)
GLUCOSE: 192 mg/dL — AB (ref 65–99)
Potassium: 4.4 mmol/L (ref 3.5–5.1)
Sodium: 135 mmol/L (ref 135–145)

## 2016-05-20 LAB — GLUCOSE, CAPILLARY
GLUCOSE-CAPILLARY: 212 mg/dL — AB (ref 65–99)
GLUCOSE-CAPILLARY: 258 mg/dL — AB (ref 65–99)
Glucose-Capillary: 144 mg/dL — ABNORMAL HIGH (ref 65–99)
Glucose-Capillary: 240 mg/dL — ABNORMAL HIGH (ref 65–99)

## 2016-05-20 LAB — CBC
HCT: 26 % — ABNORMAL LOW (ref 36.0–46.0)
Hemoglobin: 8.3 g/dL — ABNORMAL LOW (ref 12.0–15.0)
MCH: 30.3 pg (ref 26.0–34.0)
MCHC: 31.9 g/dL (ref 30.0–36.0)
MCV: 94.9 fL (ref 78.0–100.0)
PLATELETS: 329 10*3/uL (ref 150–400)
RBC: 2.74 MIL/uL — ABNORMAL LOW (ref 3.87–5.11)
RDW: 17.2 % — AB (ref 11.5–15.5)
WBC: 8.4 10*3/uL (ref 4.0–10.5)

## 2016-05-20 LAB — MAGNESIUM: Magnesium: 1.5 mg/dL — ABNORMAL LOW (ref 1.7–2.4)

## 2016-05-20 MED ORDER — FUROSEMIDE 10 MG/ML IJ SOLN
40.0000 mg | Freq: Two times a day (BID) | INTRAMUSCULAR | Status: DC
  Administered 2016-05-20 – 2016-05-21 (×2): 40 mg via INTRAVENOUS
  Filled 2016-05-20 (×2): qty 4

## 2016-05-20 NOTE — Progress Notes (Addendum)
Inpatient Diabetes Program Recommendations  AACE/ADA: New Consensus Statement on Inpatient Glycemic Control (2015)  Target Ranges:  Prepandial:   less than 140 mg/dL      Peak postprandial:   less than 180 mg/dL (1-2 hours)      Critically ill patients:  140 - 180 mg/dL   Lab Results  Component Value Date   GLUCAP 144 (H) 05/20/2016   HGBA1C 12.9 (H) 04/25/2016   Results for KRYSTIN, RUDDEN (MRN [id number]) as of 05/20/2016 09:25  Ref. Range 05/19/2016 07:36 05/19/2016 11:47 05/19/2016 17:05 05/19/2016 21:36 05/20/2016 07:48  Glucose-Capillary Latest Ref Range: 65 - 99 mg/dL 139 (H) 205 (H) 190 (H) 222 (H) 144 (H)   Review of Glycemic Control  Current orders for Inpatient glycemic control:     Sensitive correction scale Novolog 0-9 units TIDAC and 0-5 units QHS   Inpatient Diabetes Program Recommendations:    While on steroids, please consider Meal Coverage of Novolog 3 units TIDAC if patient eats > 50% of meal.  Thank you,  Windy Carina, RN, MSN Diabetes Coordinator Inpatient Diabetes Program [phone] (Team Pager)

## 2016-05-20 NOTE — Progress Notes (Signed)
Patient stable 7am-7pm. Ambulated in hallway 50 feet, good effort.  Carrie Watts

## 2016-05-20 NOTE — Progress Notes (Signed)
PROGRESS NOTE    Carrie Watts  [id number] DOB: Dec 11, 1950 DOA: 05/11/2016 PCP: No PCP Per Patient     Brief Narrative:  66 y.o.BF PMHx CVA,  Atrial fibrillation ,Grade 2, mixed infiltrating lobular and ductal, involving multiple quadrants Left Breast cancer S/P left mastectomy and axillary lymph node dissection at [facility] in [place] , [place] chemotherapy), Saddle PE and DVT (Dx 1/14), new DM Type 2, HLD, Renal disorder, who presented with chest pain. The patient developed chest pain and stroke-like symptoms about two weeks before she flew to the [place] to visit her daughter at the beginning of January. She was admitted here on 1/14 and diagnosed with new saddle PE with right heart strain and started on Lovenox, transitioned to apixaban at discharge.She has been taking her apixaban and is taking it as prescribed (although she told pharmacy tech she had just transitioned to *once* daily dosing). Since discharge, she has had several flares of severe left sided chest pain, sharp and severe, worse with coughing or deep breathing and associated with feeling she can't catch her breath. Due to dyspnea she underwent a repeat CT angiogram this admission which showed decreased clot burden. 2-D echo showed depressed ejection fraction with diffuse hypokinesis, and cardiology was consulted.  Assessment & Plan:  Acute hypoxic respiratory failure / chest pain - Multifactorial, likely due to pulmonary embolism as well as acute systolic CHF, mild pulmonary hypertension - Wean off oxygen as tolerated, this morning she is on room ai  Acute systolic CHF - Cardiac catheterization 2/7 without significant obstruction - Continue Coreg, Lipitor - ?related to doxorubucin which she apparently received in December, will have patient follow-up with heart failure clinic - add ARB - slight fluid overloaded, continue Lasix IV   Left ankle pain - Complained more so yesterday afternoon, obtain x-ray  which were negative for fractures, personally reviewed, likely gout (she was diagnosed with gout before), started on prednisone and colchicine with significant improvement, today her pain is improved and she is able to ambulate more. Have asked for a PT consult.  Acute saddle PE (Dx 1/14) - SSx improved  - On Eliquis at home, continue. She was maintained briefly on heparin until 2/8 for cardiac catheterization  Anemia in the setting of acute illness and positive fecal occult - GI consulted - EGD done 2.5 and unremarkable study, recommendations is to continue monitor for signs of bleeding, OK to resume AC, OK to proceed with cardiac work up  - CBC in AM, hemoglobin has remained stable on anticoagulation  DM type 2 uncontrolled with complication  - 0000000 Hemoglobin A1c =12.9  - Hold oral hypoglycemic agents during this hospitalization. - diabetic educator consulted  - Sensitive SSI for now  Hypomagnesemia - supplement and repeat Mg in AM - normal this morning  Pulmonary hypertension  GERD/Hiatial hernia - PPI to twice a day. Continue Pepcid.  Grade 2, mixed infiltrating lobular and ductal, involving multiple quadrants Left Breast cancer  - Continue Arimidex. Outpatient follow-up.     DVT prophylaxis: Heparin drip Code Status: Full Family Communication: No family at bedside, d/w daughter over the phone Disposition Plan: home 1-2 days   Consultants:  Cardiology GI   Procedures/Significant Events:  1/14 CT angiogram chest W contrast (previous admission):Positive PE ncluding saddle embolus and bilateral lobar and segmentale.- Moderately large clot burden. Positive for acute CT evidence of right heart strain (RV/LV Ratio = 1.19)  -Patchy nodular ground-glass infiltrative changes in the lungs may be due to multifocal  pneumonia, edema, or infarcts. -Metastatic disease would be a less likely consideration but due to history of primary carcinoma, non-contrast chest CT at 3-6  months is recommended.  2/1 Echocardiogram:Left ventricle:  mild LVH. -LVEF = 40% to 45%. Diffuse hypokinesis. - (grade 1 diastolic dysfunction). - Mitral valve: There was mild regurgitation. - Pulmonary arteries: . PA  peak pressure: 40 mm Hg (S).  Subjective: - Less ankle pain, she feels like she is breathing better, no chest pain, nausea or vomiting  Objective: Vitals:   05/19/16 1643 05/19/16 2112 05/20/16 0437 05/20/16 0944  BP:  (!) 143/84 136/79 101/69  Pulse: 84 75 75 79  Resp:  18 18   Temp:  98.3 F (36.8 C) 97.9 F (36.6 C)   TempSrc:  Oral Oral   SpO2:  100% 100%   Weight:   86.7 kg (191 lb 1.6 oz)   Height:        Intake/Output Summary (Last 24 hours) at 05/20/16 1033 Last data filed at 05/20/16 0942  Gross per 24 hour  Intake              720 ml  Output             2400 ml  Net            -1680 ml   Filed Weights   05/18/16 0350 05/19/16 0510 05/20/16 0437  Weight: 85.9 kg (189 lb 6.4 oz) 84.9 kg (187 lb 3.2 oz) 86.7 kg (191 lb 1.6 oz)    Examination:  General: A/O 4, NAD, No acute respiratory distress Lungs: Clear to auscultation bilaterally without wheezes or crackles Cardiovascular: Regular rate and rhythm without murmur gallop or rub normal S1 and S2. 1-2+ LE edema Abdomen: negative abdominal pain, nondistended, positive soft, bowel sounds, no rebound, no ascites, no appreciable mass Extremities: No significant cyanosis, clubbing, or edema bilateral lower extremities. Significant LLE ankle tenderness with movement Skin: Negative rashes, lesions, ulcers   Data Reviewed: Care during the described time interval was provided by me .   CBC:  Recent Labs Lab 05/16/16 0303 05/17/16 0217 05/18/16 0526 05/19/16 0539 05/20/16 0347  WBC 7.5 8.2 7.5 7.5 8.4  HGB 8.1* 8.0* 8.1* 8.1* 8.3*  HCT 24.5* 25.3* 25.1* 26.4* 26.0*  MCV 93.5 95.1 96.5 97.4 94.9  PLT PLATELET CLUMPS NOTED ON SMEAR, UNABLE TO ESTIMATE 326 324 326 [id number]   Basic Metabolic  Panel:  Recent Labs Lab 05/16/16 0303 05/17/16 0217 05/18/16 0526 05/19/16 0539 05/20/16 0347  NA 137 137 137 140 135  K 3.6 3.6 3.6 3.9 4.4  CL 107 108 106 109 105  CO2 19* 21* 20* 20* 20*  GLUCOSE 113* 157* 167* 164* 192*  BUN 9 8 7 6 10   CREATININE 0.80 0.73 0.73 0.79 0.76  CALCIUM 9.3 9.1 8.8* 9.0 9.4  MG 1.4* 1.3* 1.6* 1.7 1.5*   Liver Function Tests: No results for input(s): AST, ALT, ALKPHOS, BILITOT, PROT, ALBUMIN in the last 168 hours. Coagulation Profile:  Recent Labs Lab 05/18/16 0526  INR 1.11   Cardiac Enzymes:  Recent Labs Lab 05/13/16 1349 05/13/16 2040  TROPONINI 0.09* 0.10*   CBG:  Recent Labs Lab 05/19/16 0736 05/19/16 1147 05/19/16 1705 05/19/16 2136 05/20/16 0748  GLUCAP 139* 205* 190* 222* 144*   Radiology Studies: Dg Ankle 2 Views Left  Result Date: 05/19/2016 CLINICAL DATA:  Pain for 4 days, worse with bearing weight. No injury. EXAM: LEFT ANKLE - 2 VIEW COMPARISON:  None. FINDINGS:  No fracture deformity nor dislocation. The ankle mortise appears congruent and the tibiofibular syndesmosis intact. Mild midfoot osteoarthrosis. No destructive bony lesions. Diffuse soft tissue swelling without subcutaneous gas or radiopaque foreign bodies. Severe vascular calcifications. IMPRESSION: Soft tissue swelling without acute osseous process. Severe vascular calcifications. Electronically Signed   By: Elon Alas M.D.   On: 05/19/2016 14:10    Scheduled Meds: . anastrozole  1 mg Oral Daily  . apixaban  5 mg Oral BID  . atorvastatin  20 mg Oral QHS  . carvedilol  12.5 mg Oral BID WC  . colchicine  0.6 mg Oral Daily  . famotidine  10 mg Oral Daily  . furosemide  40 mg Intravenous Daily  . insulin aspart  0-5 Units Subcutaneous QHS  . insulin aspart  0-9 Units Subcutaneous TID WC  . losartan  50 mg Oral Daily  . pantoprazole  40 mg Oral BID AC  . predniSONE  40 mg Oral Q breakfast  . sodium chloride flush  3 mL Intravenous Q12H    Continuous Infusions:    LOS: 8 days    Marzetta Board, MD [facility] Pager [phone]  If 7PM-7AM, please contact night-coverage [email or web address] Password [email or web address] 05/20/2016, 10:33 AM

## 2016-05-20 NOTE — Progress Notes (Signed)
Pt slept well during the night, Vitals stable, no any sign of SOB and distress noted, no any complain of pain, will continue to monitor the patient. 

## 2016-05-20 NOTE — Progress Notes (Signed)
Progress Note  Patient Name: Carrie Watts Date of Encounter: 05/20/2016  Primary Cardiologist: Dr. Meda Coffee  Subjective   No chest pain, mild dyspnea with walking. Complaining of right ankle pain worse last night requiring pain med. This morning is worse with weight bearing.  Inpatient Medications    Scheduled Meds: . anastrozole  1 mg Oral Daily  . apixaban  5 mg Oral BID  . atorvastatin  20 mg Oral QHS  . carvedilol  12.5 mg Oral BID WC  . colchicine  0.6 mg Oral Daily  . famotidine  10 mg Oral Daily  . furosemide  40 mg Intravenous Daily  . insulin aspart  0-5 Units Subcutaneous QHS  . insulin aspart  0-9 Units Subcutaneous TID WC  . losartan  50 mg Oral Daily  . pantoprazole  40 mg Oral BID AC  . predniSONE  40 mg Oral Q breakfast  . sodium chloride flush  3 mL Intravenous Q12H   Continuous Infusions:  PRN Meds: sodium chloride, acetaminophen, HYDROcodone-acetaminophen, HYDROcodone-homatropine, morphine injection, ondansetron (ZOFRAN) IV, sodium chloride flush, traMADol   Vital Signs    Vitals:   05/19/16 2112 05/20/16 0437 05/20/16 0944 05/20/16 1147  BP: (!) 143/84 136/79 101/69 127/76  Pulse: 75 75 79 82  Resp: 18 18  18   Temp: 98.3 F (36.8 C) 97.9 F (36.6 C)  98.2 F (36.8 C)  TempSrc: Oral Oral  Oral  SpO2: 100% 100%    Weight:  191 lb 1.6 oz (86.7 kg)    Height:        Intake/Output Summary (Last 24 hours) at 05/20/16 1414 Last data filed at 05/20/16 1147  Gross per 24 hour  Intake              720 ml  Output             2400 ml  Net            -1680 ml   Filed Weights   05/18/16 0350 05/19/16 0510 05/20/16 0437  Weight: 189 lb 6.4 oz (85.9 kg) 187 lb 3.2 oz (84.9 kg) 191 lb 1.6 oz (86.7 kg)    Telemetry    SR 60's-80's  - Personally Reviewed  ECG    No new - Personally Reviewed  Physical Exam   GEN: No acute distress.   Neck: No JVD, sitting upright  Cardiac: RRR, no murmurs, rubs, or gallops.  Respiratory: Clear to auscultation  bilaterally. GI: Soft, nontender, non-distended  MS: trace pedal edema;  Lt gt toe with crack in skin, tender Neuro:  Nonfocal  Psych: Normal affect   Left wrist cath site with bandage, no drainage, no edema, no pain  Labs    Chemistry  Recent Labs Lab 05/18/16 0526 05/19/16 0539 05/20/16 0347  NA 137 140 135  K 3.6 3.9 4.4  CL 106 109 105  CO2 20* 20* 20*  GLUCOSE 167* 164* 192*  BUN 7 6 10   CREATININE 0.73 0.79 0.76  CALCIUM 8.8* 9.0 9.4  GFRNONAA >60 >60 >60  GFRAA >60 >60 >60  ANIONGAP 11 11 10      Hematology  Recent Labs Lab 05/18/16 0526 05/19/16 0539 05/20/16 0347  WBC 7.5 7.5 8.4  RBC 2.60* 2.71* 2.74*  HGB 8.1* 8.1* 8.3*  HCT 25.1* 26.4* 26.0*  MCV 96.5 97.4 94.9  MCH 31.2 29.9 30.3  MCHC 32.3 30.7 31.9  RDW 17.6* 17.8* 17.2*  PLT 324 326 329    Cardiac Enzymes  Recent  Labs Lab 05/13/16 2040  TROPONINI 0.10*    No results for input(s): TROPIPOC in the last 168 hours.   BNPNo results for input(s): BNP, PROBNP in the last 168 hours.   DDimer No results for input(s): DDIMER in the last 168 hours.   Radiology    Dg Ankle 2 Views Left  Result Date: 05/19/2016 CLINICAL DATA:  Pain for 4 days, worse with bearing weight. No injury. EXAM: LEFT ANKLE - 2 VIEW COMPARISON:  None. FINDINGS: No fracture deformity nor dislocation. The ankle mortise appears congruent and the tibiofibular syndesmosis intact. Mild midfoot osteoarthrosis. No destructive bony lesions. Diffuse soft tissue swelling without subcutaneous gas or radiopaque foreign bodies. Severe vascular calcifications. IMPRESSION: Soft tissue swelling without acute osseous process. Severe vascular calcifications. Electronically Signed   By: Elon Alas M.D.   On: 05/19/2016 14:10    Cardiac Studies   05/19/2016 Cardiac cath  Mid RCA lesion, 10 %stenosed.  Mid LAD lesion, 40 %stenosed.  Mid Cx lesion, 10 %stenosed.  The left ventricular ejection fraction is 40-45% by visual  estimate.  There is mild left ventricular systolic dysfunction.  Vasospasm noted in the right radial artery.  LV end diastolic pressure is normal, 11 mm Hg. Nonobstructive CAD.  Plan for medical therapy and treatment of PE and anemia.    Coronary Diagrams       ____________________________________________________________________________________  05/12/16 Echo Study Conclusions  - Left ventricle: The cavity size was normal. Wall thickness was increased in a pattern of mild LVH. Systolic function was mildly to moderately reduced. The estimated ejection fraction was in the range of 40% to 45%. Diffuse hypokinesis. Doppler parameters are consistent with abnormal left ventricular relaxation (grade 1 diastolic dysfunction). - Mitral valve: There was mild regurgitation. - Pulmonary arteries: Systolic pressure was mildly increased. PA peak pressure: 40 mm Hg (S).  Impressions:  - Mild to moderate global reduction in LV systolic function; grade 1 diastolic dysfunction; mild LVH; mild MR; trace TR with mildly elevated pulmonary pressure.  Patient Profile     66 y.o. female with a history of DM, L breast cancer s/p lumpectomy and 5 rounds of chemo in [place] and recent admission for PE and DVT at [facility] (04/24/16-05/03/16) presenting with sob, new Echo with decreased EF.  Assessment & Plan    Mild chronic systolic CHF:   -Echo this admission 05/12/16 showed 40-45% which reduced from 55-60% from 04/24/16. No RV dysfunction on recent echo however R sided heart strain on CTA of chest. Hx of angina but no cath or stress test in past.   -Now GI eval complete -Cath 05/18/2016 showed non-obstructive coronary disease (see above) -Weight up from 181 to 187 today. Has 1+ pedal edema and mild DOE with walking. I&O is -343 over 24h,  plus 313 since admission -Kidney function stable with cr 0.79. Add ACE-I and lasix. I have increased the Lasix to BID  Continue with diuresis.   PE  and DVT  -reduced clot burden on CTA.   -Eliquis stopped pt on heparin. Now cath and GI procedures complete, should be able to dc heparin and restart Eliquis   Anemia  -with Hgb 8.0 on IV heparin post polypectomy, Hgb stable at 8.1 today -EGD normal on 05/16/16, per GI OK to proceed with cardiac workup  Hypomagnesemia -Serum mag 1.3, has received mag 2 GM IV  X 2; current mag level 1.7  Right ankle pain -?Gout- will uric acid -Defer to IM    Mertie Moores, MD  05/20/2016 2:19 PM    [facility] [address],  [place], [place]  [address] Pager [phone] Phone: [phone]; Fax: [phone]

## 2016-05-20 NOTE — Progress Notes (Signed)
Pt educated about safety and bed alarm during the night, however pt refused to be on bed alarm stating that she will call when assistance is needed. Will continue to monitor and round on the patient.  Allia Wiltsey, RN

## 2016-05-20 NOTE — Discharge Instructions (Addendum)
Follow with Cardiology (heart failure clinic) in 2-3 weeks  Please get a complete blood count and chemistry panel checked by your Primary MD at your next visit, and again as instructed by your Primary MD. Please get your medications reviewed and adjusted by your Primary MD.  Please request your Primary MD to go over all Hospital Tests and Procedure/Radiological results at the follow up, please get all Hospital records sent to your Prim MD by signing hospital release before you go home.  If you had Pneumonia of Lung problems at the Hospital: Please get a 2 view Chest X ray done in 6-8 weeks after hospital discharge or sooner if instructed by your Primary MD.  If you have Congestive Heart Failure: Please call your Cardiologist or Primary MD anytime you have any of the following symptoms:  1) 3 pound weight gain in 24 hours or 5 pounds in 1 week  2) shortness of breath, with or without a dry hacking cough  3) swelling in the hands, feet or stomach  4) if you have to sleep on extra pillows at night in order to breathe  Follow cardiac low salt diet and 1.5 lit/day fluid restriction.  If you have diabetes Accuchecks 4 times/day, Once in AM empty stomach and then before each meal. Log in all results and show them to your primary doctor at your next visit. If any glucose reading is under 80 or above 300 call your primary MD immediately.  If you have Seizure/Convulsions/Epilepsy: Please do not drive, operate heavy machinery, participate in activities at heights or participate in high speed sports until you have seen by Primary MD or a Neurologist and advised to do so again.  If you had Gastrointestinal Bleeding: Please ask your Primary MD to check a complete blood count within one week of discharge or at your next visit. Your endoscopic/colonoscopic biopsies that are pending at the time of discharge, will also need to followed by your Primary MD.  Get Medicines reviewed and adjusted. Please take  all your medications with you for your next visit with your Primary MD  Please request your Primary MD to go over all hospital tests and procedure/radiological results at the follow up, please ask your Primary MD to get all Hospital records sent to his/her office.  If you experience worsening of your admission symptoms, develop shortness of breath, life threatening emergency, suicidal or homicidal thoughts you must seek medical attention immediately by calling 911 or calling your MD immediately  if symptoms less severe.  You must read complete instructions/literature along with all the possible adverse reactions/side effects for all the Medicines you take and that have been prescribed to you. Take any new Medicines after you have completely understood and accpet all the possible adverse reactions/side effects.   Do not drive or operate heavy machinery when taking Pain medications.   Do not take more than prescribed Pain, Sleep and Anxiety Medications  Special Instructions: If you have smoked or chewed Tobacco  in the last 2 yrs please stop smoking, stop any regular Alcohol  and or any Recreational drug use.  Wear Seat belts while driving.  Please note You were cared for by a hospitalist during your hospital stay. If you have any questions about your discharge medications or the care you received while you were in the hospital after you are discharged, you can call the unit and asked to speak with the hospitalist on call if the hospitalist that took care of you is not available.  Once you are discharged, your primary care physician will handle any further medical issues. Please note that NO REFILLS for any discharge medications will be authorized once you are discharged, as it is imperative that you return to your primary care physician (or establish a relationship with a primary care physician if you do not have one) for your aftercare needs so that they can reassess your need for medications and  monitor your lab values.  You can reach the hospitalist office at phone [phone] or fax [phone]   If you do not have a primary care physician, you can call [phone] for a physician referral.  Activity: As tolerated with Full fall precautions use walker/cane & assistance as needed  Diet: heart healthy, low salt  Disposition Home      Information on my medicine - ELIQUIS (apixaban)   Why was Eliquis prescribed for you? Eliquis was prescribed for you to reduce the risk of a blood clot forming that can cause a stroke if you have a medical condition called atrial fibrillation (a type of irregular heartbeat).  What do You need to know about Eliquis ? Take your Eliquis TWICE DAILY - one tablet in the morning and one tablet in the evening with or without food. If you have difficulty swallowing the tablet whole please discuss with your pharmacist how to take the medication safely.  Take Eliquis exactly as prescribed by your doctor and DO NOT stop taking Eliquis without talking to the doctor who prescribed the medication.  Stopping may increase your risk of developing a stroke.  Refill your prescription before you run out.  After discharge, you should have regular check-up appointments with your healthcare provider that is prescribing your Eliquis.  In the future your dose may need to be changed if your kidney function or weight changes by a significant amount or as you get older.  What do you do if you miss a dose? If you miss a dose, take it as soon as you remember on the same day and resume taking twice daily.  Do not take more than one dose of ELIQUIS at the same time to make up a missed dose.  Important Safety Information A possible side effect of Eliquis is bleeding. You should call your healthcare provider right away if you experience any of the following: ? Bleeding from an injury or your nose that does not stop. ? Unusual colored urine (red or dark brown) or unusual  colored stools (red or black). ? Unusual bruising for unknown reasons. ? A serious fall or if you hit your head (even if there is no bleeding).  Some medicines may interact with Eliquis and might increase your risk of bleeding or clotting while on Eliquis. To help avoid this, consult your healthcare provider or pharmacist prior to using any new prescription or non-prescription medications, including herbals, vitamins, non-steroidal anti-inflammatory drugs (NSAIDs) and supplements.  This website has more information on Eliquis (apixaban): http://www.eliquis.com/eliquis/home

## 2016-05-21 DIAGNOSIS — I248 Other forms of acute ischemic heart disease: Secondary | ICD-10-CM | POA: Diagnosis not present

## 2016-05-21 DIAGNOSIS — I2602 Saddle embolus of pulmonary artery with acute cor pulmonale: Secondary | ICD-10-CM | POA: Diagnosis not present

## 2016-05-21 DIAGNOSIS — I2510 Atherosclerotic heart disease of native coronary artery without angina pectoris: Secondary | ICD-10-CM | POA: Diagnosis not present

## 2016-05-21 DIAGNOSIS — R0902 Hypoxemia: Secondary | ICD-10-CM | POA: Diagnosis not present

## 2016-05-21 DIAGNOSIS — E1165 Type 2 diabetes mellitus with hyperglycemia: Secondary | ICD-10-CM | POA: Diagnosis not present

## 2016-05-21 DIAGNOSIS — I428 Other cardiomyopathies: Secondary | ICD-10-CM | POA: Diagnosis not present

## 2016-05-21 DIAGNOSIS — R079 Chest pain, unspecified: Secondary | ICD-10-CM | POA: Diagnosis not present

## 2016-05-21 DIAGNOSIS — I5023 Acute on chronic systolic (congestive) heart failure: Secondary | ICD-10-CM | POA: Diagnosis not present

## 2016-05-21 DIAGNOSIS — I429 Cardiomyopathy, unspecified: Secondary | ICD-10-CM | POA: Diagnosis not present

## 2016-05-21 DIAGNOSIS — I5021 Acute systolic (congestive) heart failure: Secondary | ICD-10-CM | POA: Diagnosis not present

## 2016-05-21 DIAGNOSIS — J9601 Acute respiratory failure with hypoxia: Secondary | ICD-10-CM | POA: Diagnosis not present

## 2016-05-21 LAB — BASIC METABOLIC PANEL
Anion gap: 13 (ref 5–15)
BUN: 16 mg/dL (ref 6–20)
CALCIUM: 9.4 mg/dL (ref 8.9–10.3)
CHLORIDE: 101 mmol/L (ref 101–111)
CO2: 23 mmol/L (ref 22–32)
CREATININE: 1 mg/dL (ref 0.44–1.00)
GFR, EST NON AFRICAN AMERICAN: 57 mL/min — AB (ref >60)
Glucose, Bld: 159 mg/dL — ABNORMAL HIGH (ref 65–99)
Potassium: 3.8 mmol/L (ref 3.5–5.1)
SODIUM: 137 mmol/L (ref 135–145)

## 2016-05-21 LAB — GLUCOSE, CAPILLARY
GLUCOSE-CAPILLARY: 154 mg/dL — AB (ref 65–99)
Glucose-Capillary: 254 mg/dL — ABNORMAL HIGH (ref 65–99)
Glucose-Capillary: 273 mg/dL — ABNORMAL HIGH (ref 65–99)
Glucose-Capillary: 202 mg/dL — ABNORMAL HIGH (ref 65–99)

## 2016-05-21 LAB — MAGNESIUM: Magnesium: 1.5 mg/dL — ABNORMAL LOW (ref 1.7–2.4)

## 2016-05-21 MED ORDER — FUROSEMIDE 40 MG PO TABS
40.0000 mg | ORAL_TABLET | Freq: Every day | ORAL | Status: DC
  Administered 2016-05-22: 40 mg via ORAL
  Filled 2016-05-21 (×2): qty 1

## 2016-05-21 MED ORDER — LOPERAMIDE HCL 2 MG PO CAPS
2.0000 mg | ORAL_CAPSULE | ORAL | Status: DC | PRN
  Administered 2016-05-21: 2 mg via ORAL
  Filled 2016-05-21: qty 1

## 2016-05-21 MED ORDER — MAGNESIUM SULFATE 2 GM/50ML IV SOLN
2.0000 g | Freq: Once | INTRAVENOUS | Status: AC
Start: 2016-05-21 — End: 2016-05-21
  Administered 2016-05-21: 2 g via INTRAVENOUS
  Filled 2016-05-21 (×2): qty 50

## 2016-05-21 MED ORDER — POTASSIUM CHLORIDE CRYS ER 10 MEQ PO TBCR
10.0000 meq | EXTENDED_RELEASE_TABLET | Freq: Every day | ORAL | Status: DC
  Administered 2016-05-21 – 2016-05-22 (×2): 10 meq via ORAL
  Filled 2016-05-21 (×2): qty 1

## 2016-05-21 NOTE — Progress Notes (Addendum)
Progress Note  Patient Name: Carrie Watts Date of Encounter: 05/21/2016  Primary Cardiologist: Dr. Ena Dawley  Subjective   Feels much better, walking around with significantly improved dyspnea, no chest pain. No palpitations.  Inpatient Medications    Scheduled Meds: . anastrozole  1 mg Oral Daily  . apixaban  5 mg Oral BID  . atorvastatin  20 mg Oral QHS  . carvedilol  12.5 mg Oral BID WC  . colchicine  0.6 mg Oral Daily  . famotidine  10 mg Oral Daily  . furosemide  40 mg Intravenous BID  . insulin aspart  0-5 Units Subcutaneous QHS  . insulin aspart  0-9 Units Subcutaneous TID WC  . losartan  50 mg Oral Daily  . pantoprazole  40 mg Oral BID AC  . predniSONE  40 mg Oral Q breakfast  . sodium chloride flush  3 mL Intravenous Q12H    PRN Meds: sodium chloride, acetaminophen, HYDROcodone-acetaminophen, HYDROcodone-homatropine, morphine injection, ondansetron (ZOFRAN) IV, sodium chloride flush, traMADol   Vital Signs    Vitals:   05/20/16 0944 05/20/16 1147 05/20/16 2019 05/21/16 0448  BP: 101/69 127/76 118/69 110/69  Pulse: 79 82 67 69  Resp:  18 18 20   Temp:  98.2 F (36.8 C) 98.1 F (36.7 C) 97.6 F (36.4 C)  TempSrc:  Oral Oral Oral  SpO2:   100% 100%  Weight:    189 lb 11.2 oz (86 kg)  Height:        Intake/Output Summary (Last 24 hours) at 05/21/16 1003 Last data filed at 05/21/16 0923  Gross per 24 hour  Intake              890 ml  Output             1900 ml  Net            -1010 ml   Filed Weights   05/19/16 0510 05/20/16 0437 05/21/16 0448  Weight: 187 lb 3.2 oz (84.9 kg) 191 lb 1.6 oz (86.7 kg) 189 lb 11.2 oz (86 kg)    Telemetry    I personally reviewed telemetry which shows sinus rhythm.  ECG    I personally reviewed the tracing from 05/11/2016 which showed sinus tachycardia with leftward axis, low voltage, poor R-wave progression, nonspecific ST changes.  Physical Exam   GEN: No acute distress. Using walker.  Neck: No  JVD Cardiac: RRR, no murmurs, rubs, or gallops.  Respiratory: Clear to auscultation bilaterally. GI: Soft, nontender, non-distended  MS:  Trace ankle edema; No deformity. Neuro:  Nonfocal  Psych: Normal affect   Labs    Chemistry  Recent Labs Lab 05/19/16 0539 05/20/16 0347 05/21/16 0439  NA 140 135 137  K 3.9 4.4 3.8  CL 109 105 101  CO2 20* 20* 23  GLUCOSE 164* 192* 159*  BUN 6 10 16   CREATININE 0.79 0.76 1.00  CALCIUM 9.0 9.4 9.4  GFRNONAA >60 >60 57*  GFRAA >60 >60 >60  ANIONGAP 11 10 13      Hematology  Recent Labs Lab 05/18/16 0526 05/19/16 0539 05/20/16 0347  WBC 7.5 7.5 8.4  RBC 2.60* 2.71* 2.74*  HGB 8.1* 8.1* 8.3*  HCT 25.1* 26.4* 26.0*  MCV 96.5 97.4 94.9  MCH 31.2 29.9 30.3  MCHC 32.3 30.7 31.9  RDW 17.6* 17.8* 17.2*  PLT 324 326 329    Radiology    Dg Ankle 2 Views Left  Result Date: 05/19/2016 CLINICAL DATA:  Pain for 4  days, worse with bearing weight. No injury. EXAM: LEFT ANKLE - 2 VIEW COMPARISON:  None. FINDINGS: No fracture deformity nor dislocation. The ankle mortise appears congruent and the tibiofibular syndesmosis intact. Mild midfoot osteoarthrosis. No destructive bony lesions. Diffuse soft tissue swelling without subcutaneous gas or radiopaque foreign bodies. Severe vascular calcifications. IMPRESSION: Soft tissue swelling without acute osseous process. Severe vascular calcifications. Electronically Signed   By: Elon Alas M.D.   On: 05/19/2016 14:10    Cardiac Studies   Echocardiogram 05/12/2016: Study Conclusions  - Left ventricle: The cavity size was normal. Wall thickness was   increased in a pattern of mild LVH. Systolic function was mildly   to moderately reduced. The estimated ejection fraction was in the   range of 40% to 45%. Diffuse hypokinesis. Doppler parameters are   consistent with abnormal left ventricular relaxation (grade 1   diastolic dysfunction). - Mitral valve: There was mild regurgitation. -  Pulmonary arteries: Systolic pressure was mildly increased. PA   peak pressure: 40 mm Hg (S).  Impressions:  - Mild to moderate global reduction in LV systolic function; grade   1 diastolic dysfunction; mild LVH; mild MR; trace TR with mildly   elevated pulmonary pressure.  Cardiac catheterization 05/18/2016:  Mid RCA lesion, 10 %stenosed.  Mid LAD lesion, 40 %stenosed.  Mid Cx lesion, 10 %stenosed.  The left ventricular ejection fraction is 40-45% by visual estimate.  There is mild left ventricular systolic dysfunction.  Vasospasm noted in the right radial artery.  LV end diastolic pressure is normal, 11 mm Hg.   Nonobstructive CAD.  Plan for medical therapy and treatment of PE and anemia.    Patient Profile     66 y.o. female with history of diabetes mellitus, left sided breast cancer status post lumpectomy and 5 rounds of chemotherapy in [place], pulmonary embolus and DVT. She presents with newly documented cardiomyopathy, LVEF 40-45% range. Also found to be anemic and status post GI workup with polypectomy. She underwent cardiac catheterization demonstrating mild nonobstructive CAD and is to be managed medically from a cardiac perspective. Transitioned from heparin back to Eliquis.  Assessment & Plan    1. Nonischemic cardiomyopathy, LVEF 40-45% range. Cardiac catheterization demonstrates only mild nonobstructive CAD. Plan is medical therapy.  2. Acute systolic heart failure, symptomatically improved with diuresis.  3. Pulmonary embolus and DVT, now transitioned from heparin back to Eliquis.  4. Anemia in the setting of acute illness and also heme positive stool. GI has evaluated with EGD performed. Hemoglobin stable at 8.3.  5. Left-sided breast cancer, grade 2 with mixed infiltrating lobular and ductal carcinoma. On Arimidex.  Improved from a cardiac perspective. Current regimen includes Coreg, Lipitor, Cozaar, and IV Lasix. Will transition to oral Lasix and add low  dose potassium supplement. Continue Eliquis for treatment of PE and DVT. No further cardiac testing is planned at this time. Anticipate discharge home soon, will need to have follow-up with Dr. Meda Coffee or APP in the next 7-10 days. We will sign off.  Signed, Rozann Lesches, MD  05/21/2016, 10:03 AM

## 2016-05-21 NOTE — Progress Notes (Signed)
Talked to MD Domenic Polite and [name], pt received IV lasix 40mg  at 0810. Clarification on new PO lasix order due for 1030. Both MD stated do not give PO medication, they are switching the patient to once daily lasix. Pt will begin PO medication tomorrow. Continue with PO potassium due at [place]

## 2016-05-21 NOTE — Progress Notes (Signed)
Patient alert and oriented, slept during the night. No complaints of pain. Patient mentioned that she feels discomfort in her ears and mentioned "that feels like last week ear infection". Vital signs stable. Will share  This concern with day shift nurse. Will continue to monitor.  Devinne Epstein, RN

## 2016-05-21 NOTE — Progress Notes (Signed)
MD follow up on physical therapy eval, and stated pt will be discharged tomorrow with home health.  Carrie Watts Leory Plowman

## 2016-05-21 NOTE — Progress Notes (Signed)
PROGRESS NOTE    Carrie Watts  [id number] DOB: 09/06/1950 DOA: 05/11/2016 PCP: No PCP Per Patient     Brief Narrative:  66 y.o.BF PMHx CVA,  Atrial fibrillation ,Grade 2, mixed infiltrating lobular and ductal, involving multiple quadrants Left Breast cancer S/P left mastectomy and axillary lymph node dissection at [facility] in [place] , [place] chemotherapy), Saddle PE and DVT (Dx 1/14), new DM Type 2, HLD, Renal disorder, who presented with chest pain. The patient developed chest pain and stroke-like symptoms about two weeks before she flew to the [place] to visit her daughter at the beginning of January. She was admitted here on 1/14 and diagnosed with new saddle PE with right heart strain and started on Lovenox, transitioned to apixaban at discharge.She has been taking her apixaban and is taking it as prescribed (although she told pharmacy tech she had just transitioned to *once* daily dosing). Since discharge, she has had several flares of severe left sided chest pain, sharp and severe, worse with coughing or deep breathing and associated with feeling she can't catch her breath. Due to dyspnea she underwent a repeat CT angiogram this admission which showed decreased clot burden. 2-D echo showed depressed ejection fraction with diffuse hypokinesis, and cardiology was consulted.  Assessment & Plan:  Acute hypoxic respiratory failure / chest pain - Multifactorial, likely due to pulmonary embolism as well as acute systolic CHF, mild pulmonary hypertension - Wean off oxygen as tolerated, she is on room air now  Acute systolic CHF - Cardiac catheterization 2/7 without significant obstruction - Continue Coreg, Lipitor - ?related to doxorubucin which she apparently received in December, will have patient follow-up with heart failure clinic - add ARB - slight fluid overloaded, change IV Lasix to po, monitor response today, home in am if renal function OK  Left ankle pain due to  gout - Complained more so yesterday afternoon, obtain x-ray which were negative for fractures, personally reviewed, likely gout (she was diagnosed with gout before), started on prednisone and colchicine with significant improvement, today her pain is improved and she is able to ambulate more.   Acute saddle PE (Dx 1/14) - SSx improved  - On Eliquis at home, continue. She was maintained briefly on heparin until 2/8 for cardiac catheterization  Anemia in the setting of acute illness and positive fecal occult - GI consulted - EGD done 2.5 and unremarkable study, recommendations is to continue monitor for signs of bleeding, OK to resume AC, OK to proceed with cardiac work up  - hemoglobin has remained stable on anticoagulation  DM type 2 uncontrolled with complication  - 0000000 Hemoglobin A1c =12.9  - Hold oral hypoglycemic agents during this hospitalization. - diabetic educator consulted  - Sensitive SSI for now  Hypomagnesemia - supplement and repeat Mg in AM - normal this morning  Pulmonary hypertension  GERD/Hiatial hernia - PPI to twice a day. Continue Pepcid.  Grade 2, mixed infiltrating lobular and ductal, involving multiple quadrants Left Breast cancer  - Continue Arimidex. Outpatient follow-up.     DVT prophylaxis: Heparin drip Code Status: Full Family Communication: No family at bedside, d/w daughter over the phone Disposition Plan: home in am if renal function stable   Consultants:  Cardiology GI   Procedures/Significant Events:  1/14 CT angiogram chest W contrast (previous admission):Positive PE ncluding saddle embolus and bilateral lobar and segmentale.- Moderately large clot burden. Positive for acute CT evidence of right heart strain (RV/LV Ratio = 1.19)  -Patchy nodular ground-glass infiltrative  changes in the lungs may be due to multifocal pneumonia, edema, or infarcts. -Metastatic disease would be a less likely consideration but due to history of primary  carcinoma, non-contrast chest CT at 3-6 months is recommended.  2/1 Echocardiogram:Left ventricle:  mild LVH. -LVEF = 40% to 45%. Diffuse hypokinesis. - (grade 1 diastolic dysfunction). - Mitral valve: There was mild regurgitation. - Pulmonary arteries: . PA  peak pressure: 40 mm Hg (S).  Subjective: - Less ankle pain, she feels like she is breathing better, no chest pain, nausea or vomiting  Objective: Vitals:   05/20/16 1147 05/20/16 2019 05/21/16 0448 05/21/16 1233  BP: 127/76 118/69 110/69 122/71  Pulse: 82 67 69 66  Resp: 18 18 20 20   Temp: 98.2 F (36.8 C) 98.1 F (36.7 C) 97.6 F (36.4 C) 98.5 F (36.9 C)  TempSrc: Oral Oral Oral Oral  SpO2:  100% 100% 96%  Weight:   86 kg (189 lb 11.2 oz)   Height:        Intake/Output Summary (Last 24 hours) at 05/21/16 1504 Last data filed at 05/21/16 1402  Gross per 24 hour  Intake             1060 ml  Output             1500 ml  Net             -440 ml   Filed Weights   05/19/16 0510 05/20/16 0437 05/21/16 0448  Weight: 84.9 kg (187 lb 3.2 oz) 86.7 kg (191 lb 1.6 oz) 86 kg (189 lb 11.2 oz)    Examination:  General: A/O 4, NAD, No acute respiratory distress Lungs: Clear to auscultation bilaterally without wheezes or crackles Cardiovascular: Regular rate and rhythm without murmur gallop or rub normal S1 and S2. 1-2+ LE edema Abdomen: negative abdominal pain, nondistended, positive soft, bowel sounds, no rebound, no ascites, no appreciable mass Extremities: No significant cyanosis, clubbing, or edema bilateral lower extremities.  Skin: Negative rashes, lesions, ulcers   Data Reviewed: Care during the described time interval was provided by me .   CBC:  Recent Labs Lab 05/16/16 0303 05/17/16 0217 05/18/16 0526 05/19/16 0539 05/20/16 0347  WBC 7.5 8.2 7.5 7.5 8.4  HGB 8.1* 8.0* 8.1* 8.1* 8.3*  HCT 24.5* 25.3* 25.1* 26.4* 26.0*  MCV 93.5 95.1 96.5 97.4 94.9  PLT PLATELET CLUMPS NOTED ON SMEAR, UNABLE TO ESTIMATE 326  324 326 [id number]   Basic Metabolic Panel:  Recent Labs Lab 05/17/16 0217 05/18/16 0526 05/19/16 0539 05/20/16 0347 05/21/16 0439  NA 137 137 140 135 137  K 3.6 3.6 3.9 4.4 3.8  CL 108 106 109 105 101  CO2 21* 20* 20* 20* 23  GLUCOSE 157* 167* 164* 192* 159*  BUN 8 7 6 10 16   CREATININE 0.73 0.73 0.79 0.76 1.00  CALCIUM 9.1 8.8* 9.0 9.4 9.4  MG 1.3* 1.6* 1.7 1.5* 1.5*   Liver Function Tests: No results for input(s): AST, ALT, ALKPHOS, BILITOT, PROT, ALBUMIN in the last 168 hours. Coagulation Profile:  Recent Labs Lab 05/18/16 0526  INR 1.11   Cardiac Enzymes: No results for input(s): CKTOTAL, CKMB, CKMBINDEX, TROPONINI in the last 168 hours. CBG:  Recent Labs Lab 05/20/16 1145 05/20/16 1613 05/20/16 2110 05/21/16 0752 05/21/16 1135  GLUCAP 212* 240* 258* 154* 254*   Radiology Studies: No results found.  Scheduled Meds: . anastrozole  1 mg Oral Daily  . apixaban  5 mg Oral BID  . atorvastatin  20 mg Oral QHS  . carvedilol  12.5 mg Oral BID WC  . colchicine  0.6 mg Oral Daily  . famotidine  10 mg Oral Daily  . furosemide  40 mg Oral Daily  . insulin aspart  0-5 Units Subcutaneous QHS  . insulin aspart  0-9 Units Subcutaneous TID WC  . losartan  50 mg Oral Daily  . pantoprazole  40 mg Oral BID AC  . potassium chloride  10 mEq Oral Daily  . predniSONE  40 mg Oral Q breakfast  . sodium chloride flush  3 mL Intravenous Q12H   Continuous Infusions:    LOS: 9 days    Marzetta Board, MD [facility] Pager [phone]  If 7PM-7AM, please contact night-coverage [email or web address] Password [email or web address] 05/21/2016, 3:04 PM

## 2016-05-21 NOTE — Progress Notes (Addendum)
Imodium ordered and given. Will continue to monitor.  Revin Corker, RN

## 2016-05-21 NOTE — Progress Notes (Signed)
Pt stable 7am-7pm. Pt ambulated in hallway with physical therapy, tolerated well. Pt ambulating in room with walker throughout shift.   Brace Welte Leory Plowman

## 2016-05-21 NOTE — Progress Notes (Signed)
Pt is still refusing bed alarm, will continue to monitor.  Laquinn Shippy, RN

## 2016-05-21 NOTE — Progress Notes (Signed)
MD [name] called requesting Physical therapy to evaluate and treat patient. Stopped physical therapist in the hallway and ask if patient was on her list for treatment, PT said yes and will be by to evaluate her later in the day.  Carrie Watts

## 2016-05-21 NOTE — Evaluation (Signed)
Physical Therapy Evaluation Patient Details Name: Carrie Watts MRN: [id number] DOB: [birth year] Today's Date: 05/21/2016   History of Present Illness  Pt is a 66 y/o female admitted secondary to hypoxia and chest pain. Pt had cardiac cath on 2/7 showing non-obstructive CAD. PMH including but not limited to breast cancer, DM, CHF, and PE.  Clinical Impression  Pt presented ambulating in room with RW. Prior to admission, pt reported that she ambulated with use of RW. Pt currently independent with bed mobility and transfers, and supervision for safety with ambulation using a RW. Pt would benefit from continued skilled physical therapy services in the [place] setting. No further acute PT needs identified at this time. PT signing off.    Follow Up Recommendations Home health PT    Equipment Recommendations  None recommended by PT    Recommendations for Other Services       Precautions / Restrictions Precautions Precautions: Fall Restrictions Weight Bearing Restrictions: No      Mobility  Bed Mobility Overal bed mobility: Independent                Transfers Overall transfer level: Modified independent                  Ambulation/Gait Ambulation/Gait assistance: Supervision Ambulation Distance (Feet): 250 Feet Assistive device: Rolling walker (2 wheeled) Gait Pattern/deviations: Step-through pattern;Decreased step length - right;Decreased step length - left;Decreased stride length Gait velocity: decreased Gait velocity interpretation: Below normal speed for age/gender General Gait Details: pt initially very slow with ambulation but able to increase gait speed with VC'ing. No instability or LOB, supervision for safety  Stairs            Wheelchair Mobility    Modified Rankin (Stroke Patients Only)       Balance Overall balance assessment: Needs assistance   Sitting balance-Leahy Scale: Good     Standing balance support: During functional  activity;No upper extremity supported Standing balance-Leahy Scale: Fair                               Pertinent Vitals/Pain Pain Assessment: No/denies pain    Home Living Family/patient expects to be discharged to:: Private residence Living Arrangements: Children Available Help at Discharge: Family;Available PRN/intermittently Type of Home: House Home Access: Stairs to enter Entrance Stairs-Rails: None Entrance Stairs-Number of Steps: 1 Home Layout: Two level Home Equipment: Walker - 2 wheels;Bedside commode      Prior Function Level of Independence: Independent with assistive device(s)         Comments: Pt reports that she has been using RW recently. She reports that she is able to complete ADL without assist. No family available for assistance with PLOF information.     Hand Dominance        Extremity/Trunk Assessment   Upper Extremity Assessment Upper Extremity Assessment: Overall WFL for tasks assessed    Lower Extremity Assessment Lower Extremity Assessment: Overall WFL for tasks assessed    Cervical / Trunk Assessment Cervical / Trunk Assessment: Normal  Communication   Communication: Prefers language other than English  Cognition Arousal/Alertness: Awake/alert Behavior During Therapy: WFL for tasks assessed/performed Overall Cognitive Status: Within Functional Limits for tasks assessed                      General Comments      Exercises     Assessment/Plan    PT Assessment Patent  does not need any further PT services  PT Problem List            PT Treatment Interventions      PT Goals (Current goals can be found in the Care Plan section)  Acute Rehab PT Goals Patient Stated Goal: to go home with daughter.    Frequency     Barriers to discharge        Co-evaluation               End of Session   Activity Tolerance: Patient tolerated treatment well Patient left: in bed;with call bell/phone within reach  (sitting EOB) Nurse Communication: Mobility status         Time: [id number] PT Time Calculation (min) (ACUTE ONLY): 33 min   Charges:   PT Evaluation $PT Eval Moderate Complexity: 1 Procedure PT Treatments $Gait Training: 8-22 mins   PT G CodesClearnce Sorrel Angelos [place] 05/21/2016, 3:00 PM Sherie Don, Leonore, DPT [phone]

## 2016-05-22 DIAGNOSIS — R079 Chest pain, unspecified: Secondary | ICD-10-CM | POA: Diagnosis not present

## 2016-05-22 DIAGNOSIS — M109 Gout, unspecified: Secondary | ICD-10-CM | POA: Diagnosis not present

## 2016-05-22 DIAGNOSIS — I5023 Acute on chronic systolic (congestive) heart failure: Secondary | ICD-10-CM | POA: Diagnosis not present

## 2016-05-22 DIAGNOSIS — Z79811 Long term (current) use of aromatase inhibitors: Secondary | ICD-10-CM | POA: Diagnosis not present

## 2016-05-22 DIAGNOSIS — Z86718 Personal history of other venous thrombosis and embolism: Secondary | ICD-10-CM | POA: Diagnosis not present

## 2016-05-22 DIAGNOSIS — Z23 Encounter for immunization: Secondary | ICD-10-CM | POA: Diagnosis not present

## 2016-05-22 DIAGNOSIS — C50912 Malignant neoplasm of unspecified site of left female breast: Secondary | ICD-10-CM | POA: Diagnosis not present

## 2016-05-22 DIAGNOSIS — R0902 Hypoxemia: Secondary | ICD-10-CM | POA: Diagnosis not present

## 2016-05-22 DIAGNOSIS — Z9221 Personal history of antineoplastic chemotherapy: Secondary | ICD-10-CM | POA: Diagnosis not present

## 2016-05-22 DIAGNOSIS — K7460 Unspecified cirrhosis of liver: Secondary | ICD-10-CM | POA: Diagnosis not present

## 2016-05-22 DIAGNOSIS — J9601 Acute respiratory failure with hypoxia: Secondary | ICD-10-CM | POA: Diagnosis not present

## 2016-05-22 DIAGNOSIS — E1165 Type 2 diabetes mellitus with hyperglycemia: Secondary | ICD-10-CM | POA: Diagnosis not present

## 2016-05-22 DIAGNOSIS — I2602 Saddle embolus of pulmonary artery with acute cor pulmonale: Secondary | ICD-10-CM | POA: Diagnosis not present

## 2016-05-22 DIAGNOSIS — I272 Pulmonary hypertension, unspecified: Secondary | ICD-10-CM | POA: Diagnosis not present

## 2016-05-22 DIAGNOSIS — D638 Anemia in other chronic diseases classified elsewhere: Secondary | ICD-10-CM | POA: Diagnosis not present

## 2016-05-22 DIAGNOSIS — Z79899 Other long term (current) drug therapy: Secondary | ICD-10-CM | POA: Diagnosis not present

## 2016-05-22 DIAGNOSIS — D122 Benign neoplasm of ascending colon: Secondary | ICD-10-CM | POA: Diagnosis not present

## 2016-05-22 DIAGNOSIS — I248 Other forms of acute ischemic heart disease: Secondary | ICD-10-CM | POA: Diagnosis not present

## 2016-05-22 DIAGNOSIS — I429 Cardiomyopathy, unspecified: Secondary | ICD-10-CM | POA: Diagnosis not present

## 2016-05-22 DIAGNOSIS — Z7901 Long term (current) use of anticoagulants: Secondary | ICD-10-CM | POA: Diagnosis not present

## 2016-05-22 DIAGNOSIS — Z7984 Long term (current) use of oral hypoglycemic drugs: Secondary | ICD-10-CM | POA: Diagnosis not present

## 2016-05-22 DIAGNOSIS — Z8673 Personal history of transient ischemic attack (TIA), and cerebral infarction without residual deficits: Secondary | ICD-10-CM | POA: Diagnosis not present

## 2016-05-22 DIAGNOSIS — I110 Hypertensive heart disease with heart failure: Secondary | ICD-10-CM | POA: Diagnosis not present

## 2016-05-22 DIAGNOSIS — I25119 Atherosclerotic heart disease of native coronary artery with unspecified angina pectoris: Secondary | ICD-10-CM | POA: Diagnosis not present

## 2016-05-22 DIAGNOSIS — K219 Gastro-esophageal reflux disease without esophagitis: Secondary | ICD-10-CM | POA: Diagnosis not present

## 2016-05-22 LAB — BASIC METABOLIC PANEL
Anion gap: 11 (ref 5–15)
BUN: 18 mg/dL (ref 6–20)
CHLORIDE: 105 mmol/L (ref 101–111)
CO2: 23 mmol/L (ref 22–32)
CREATININE: 0.8 mg/dL (ref 0.44–1.00)
Calcium: 9.6 mg/dL (ref 8.9–10.3)
GFR calc Af Amer: >60 mL/min (ref >60)
GFR calc non Af Amer: >60 mL/min (ref >60)
GLUCOSE: 142 mg/dL — AB (ref 65–99)
Potassium: 3.9 mmol/L (ref 3.5–5.1)
SODIUM: 139 mmol/L (ref 135–145)

## 2016-05-22 LAB — GLUCOSE, CAPILLARY
Glucose-Capillary: 159 mg/dL — ABNORMAL HIGH (ref 65–99)
Glucose-Capillary: 284 mg/dL — ABNORMAL HIGH (ref 65–99)

## 2016-05-22 LAB — MAGNESIUM: Magnesium: 1.7 mg/dL (ref 1.7–2.4)

## 2016-05-22 MED ORDER — FUROSEMIDE 40 MG PO TABS: 40.0000 mg | ORAL_TABLET | Freq: Every day | ORAL | 1 refills | Status: DC

## 2016-05-22 MED ORDER — POTASSIUM CHLORIDE CRYS ER 10 MEQ PO TBCR: 10.0000 meq | EXTENDED_RELEASE_TABLET | Freq: Every day | ORAL | 1 refills | Status: DC

## 2016-05-22 MED ORDER — ANASTROZOLE 1 MG PO TABS: 1.0000 mg | ORAL_TABLET | Freq: Every day | ORAL | 1 refills | Status: DC

## 2016-05-22 MED ORDER — RIVAROXABAN (XARELTO) VTE STARTER PACK (15 & 20 MG): ORAL_TABLET | ORAL | 0 refills | Status: DC

## 2016-05-22 MED ORDER — CARVEDILOL 12.5 MG PO TABS: 12.5000 mg | ORAL_TABLET | Freq: Two times a day (BID) | ORAL | 1 refills | Status: DC

## 2016-05-22 MED ORDER — PREDNISONE 10 MG PO TABS: 10.0000 mg | ORAL_TABLET | Freq: Every day | ORAL | 0 refills | Status: DC

## 2016-05-22 MED ORDER — COLCHICINE 0.6 MG PO TABS: 0.6000 mg | ORAL_TABLET | Freq: Every day | ORAL | 0 refills | Status: DC

## 2016-05-22 MED ORDER — LOSARTAN POTASSIUM 50 MG PO TABS: 50.0000 mg | ORAL_TABLET | Freq: Every day | ORAL | 1 refills | Status: DC

## 2016-05-22 NOTE — Progress Notes (Signed)
Patient with no complaints or concerns during 7pm - 7am shift. Alert and oriented, slept mostly during the night.   Halden Phegley, RN

## 2016-05-22 NOTE — Progress Notes (Signed)
Case management contacted regarding home health needs upon discharge.

## 2016-05-22 NOTE — Plan of Care (Signed)
Problem: Education: Goal: Ability to demonstrate managment of disease process will improve Outcome: Completed/Met Date Met: 05/22/16 Heart failure booklet and hand outs given to patient and daughter. Instructions given and both verbalized understandingh

## 2016-05-22 NOTE — Discharge Summary (Signed)
Physician Discharge Summary  Carrie Watts WNU:[id number] DOB: 1950/12/14 DOA: 05/11/2016  PCP: No PCP Per Patient  Admit date: 05/11/2016 Discharge date: 05/22/2016  Admitted From: home Disposition:  home  Recommendations for Outpatient Follow-up:  1. Follow up with CHF clinic in 1-2 weeks 2. Follow up with Dr. Jana Hakim as scheduled   Home Health: PT Equipment/Devices: none  Discharge Condition: stable CODE STATUS: Full Diet recommendation: heart healthy  HPI: Carrie Watts is a 66 y.o. female with a past medical history significant for breast cancer, recent saddle PE and DVT, and new diabetes who presents with chest pain. The patient developed chest pain and stroke-like symptoms about two weeks before she flew to the [place] to visit her daughter at the beginning of January.  She was admitted here on 1/14 and diagnosed with new saddle PE with right heart strain and started on Lovenox, transitioned to apixaban at discharge. Since discharge, she has been taking no OTC analgesics, and has not been able to obtain the prescription analgesic she was prescribed.  She has obtained her apixaban and is taking it as prescribed (although she told pharmacy tech she had just transitioned to *once* daily dosing).  Since discharge, she has had several flares of severe left sided chest pain, sharp and severe, worse with coughing or deep breathing and associated with feeling she can't catch her breath.  She also endorses some left sided headache, but no fever, chills, sputum, hemoptysis.  Today she went to Dr. Virgie Dad office, where a CXR appeared to show left lower infiltrate and so she was referred to the ER.  Hospital Course: Discharge Diagnoses:  Principal Problem:   Hypoxia Active Problems:   Type 2 diabetes mellitus without complication, without long-term current use of insulin (HCC)   Acute saddle pulmonary embolism with acute cor pulmonale (HCC)   Symptomatic anemia   Abnormal echocardiogram  Elevated troponin   Abnormal CT scan, liver   Occult blood in stools   Uncontrolled type 2 diabetes mellitus with complication (HCC)   Acute systolic CHF (congestive heart failure) ([place])   Pulmonary hypertension   Primary cancer of left breast with stage 2 nodal metastasis per American Joint Committee on Cancer 7th edition (N2) (HCC)   Benign neoplasm of ascending colon   Acute hypoxic respiratory failure / chest pain - Multifactorial, likely due to pulmonary embolism as well as acute systolic CHF, mild pulmonary hypertension. Her respiratory status improved with diuresis and her hypoxia resolved and was stable on room air.  Acute systolic CHF - patient presented with fluid overload, and underwent diuresis and a 2D echo. The echo showed an EF of 40-45%, reduced from an EF of 55-60% just 2 weeks prior. Cardiology was consulted and followed patient while hospitalized. She underwent cardiac catheterization on 2/7 without significant obstruction. She was placed on Coreg, Lipitor and Losartan. Her CHF may be related to doxorubicin which she has received 2 months ago for her breast cancer Left ankle pain due to gout - likely gouty attack in the setting of diuresis, XR negative, improved with prednisone and colchicine, she is to continue a taper for 5 more days.  Acute saddle PE (Dx 1/14) - SSx improve, on Eliquis at home, continue. She was maintained briefly on heparin around her cardiac catheterization Anemia in the setting of acute illness and positive fecal occult - GI consulted, EGD and colonoscopy doe and without acute findings. Hemoglobin has remained stable on anticoagulation DM type 2 uncontrolled with complication - 0/34 Hemoglobin A1c =  12.9, resume home medications Pulmonary hypertension GERD/Hiatialhernia - PPI to twice a day. Continue Pepcid. Grade 2, mixed infiltrating lobular and ductal, involving multiple quadrants Left Breast cancer  - Continue Arimidex. Outpatient  follow-up.  Discharge Instructions   Allergies as of 05/22/2016      Reactions   Dust Mite Extract       Medication List    TAKE these medications   anastrozole 1 MG tablet Commonly known as:  ARIMIDEX Take 1 tablet (1 mg total) by mouth daily.   apixaban 5 MG Tabs tablet Commonly known as:  ELIQUIS Take 1 tablet (5 mg total) by mouth 2 (two) times daily. What changed:  when to take this  Another medication with the same name was removed. Continue taking this medication, and follow the directions you see here.   atorvastatin 20 MG tablet Commonly known as:  LIPITOR Take 1 tablet (20 mg total) by mouth at bedtime. Can switch to a cheaper statin if needed   carvedilol 12.5 MG tablet Commonly known as:  COREG Take 1 tablet (12.5 mg total) by mouth 2 (two) times daily with a meal.   colchicine 0.6 MG tablet Take 1 tablet (0.6 mg total) by mouth daily.   folic acid 295 MCG tablet Commonly known as:  FOLVITE Take 800 mcg by mouth daily.   furosemide 40 MG tablet Commonly known as:  LASIX Take 1 tablet (40 mg total) by mouth daily.   glucose monitoring kit monitoring kit 1 each by Does not apply route 4 (four) times daily - after meals and at bedtime. 1 month Diabetic Testing Supplies for QAC-QHS accuchecks.  Diagnosis E11.65. Any brand OK   losartan 50 MG tablet Commonly known as:  COZAAR Take 1 tablet (50 mg total) by mouth daily.   metFORMIN 1000 MG tablet Commonly known as:  GLUCOPHAGE Take 1 tablet (1,000 mg total) by mouth 2 (two) times daily with a meal.   multivitamin with minerals Tabs tablet Take 1 tablet by mouth daily.   omeprazole 20 MG capsule Commonly known as:  PRILOSEC Take 20 mg by mouth daily.   potassium chloride 10 MEQ tablet Commonly known as:  K-DUR,KLOR-CON Take 1 tablet (10 mEq total) by mouth daily.   predniSONE 10 MG tablet Commonly known as:  DELTASONE Take 1 tablet (10 mg total) by mouth daily with breakfast. 2 tablets daily x  4 days then 1 daily x 3 days   ranitidine 300 MG tablet Commonly known as:  ZANTAC Take 300 mg by mouth at bedtime.   traMADol 50 MG tablet Commonly known as:  ULTRAM Take 1 tablet (50 mg total) by mouth every 6 (six) hours as needed.      Follow-up Information    Bensimhon, Daniel, MD. Schedule an appointment as soon as possible for a visit in 3 week(s).   Specialty:  Cardiology Contact information: [address] [place] [address] [phone]        Chauncey Cruel, MD. Schedule an appointment as soon as possible for a visit in 1 month(s).   Specialty:  Oncology Contact information: [place] [phone]        [facility] Follow up.   Why:  you will go to this clinic for medication assist once you have gone to your follow up appointment at the [facility] information: [address] [place] [address] [phone]  [facility] Follow up.   Specialty:  Internal Medicine Why:  You DO NOT have sickle cell disease but this clinic will call you for a follow up appointment( the community clinic is full and this clinic is taking over some of the patients who need to be seen at the community clinic. Contact information: [place] [address] [phone]         Allergies  Allergen Reactions  . Dust Mite Extract     Consultations:  Gastroenterology  Cardiology  Procedures/Studies:  2D echo 2/1 Impressions: - Mild to moderate global reduction in LV systolic function; grade 1 diastolic dysfunction; mild LVH; mild MR; trace TR with mildly elevated pulmonary pressure.   Cardiac cath  Mid RCA lesion, 10 %stenosed.  Mid LAD lesion, 40 %stenosed.  Mid Cx lesion, 10 %stenosed.  The left ventricular ejection fraction is 40-45% by visual estimate.  There is mild left  ventricular systolic dysfunction.  Vasospasm noted in the right radial artery.  LV end diastolic pressure is normal, 11 mm Hg. Nonobstructive CAD.  Plan for medical therapy and treatment of PE and anemia.     EGD 2/5 - Normal esophagus. - Normal stomach. - Normal examined duodenum. - No specimens collected.  Colonoscopy 2/5 - One 3 mm polyp in the ascending colon, removed with a cold snare. Resected and retrieved. - Diverticulosis in the left colon. - The examination was otherwise normal on direct and retroflexion views.  Ct Abdomen Pelvis Wo Contrast  Result Date: 05/05/2016 CLINICAL DATA:  Anemia. Gastrointestinal bleeding. Recent pulmonary embolus. EXAM: CT ABDOMEN AND PELVIS WITHOUT CONTRAST TECHNIQUE: Multidetector CT imaging of the abdomen and pelvis was performed following the standard protocol without IV contrast. COMPARISON:  None. FINDINGS: Lower chest: Mild dependent atelectasis at both lung bases. No pleural or pericardial fluid. Hepatobiliary: No focal or acute finding. No calcified gallstones. Question slight lobularity of the liver surface raising the possibility of early cirrhosis. Pancreas: Normal Spleen: Normal Adrenals/Urinary Tract: Adrenal glands are normal. Kidneys are normal. No cyst, mass, stone or hydronephrosis. Bladder is normal. Stomach/Bowel: No acute bowel finding. Normal appearing appendix. Diverticulosis of the descending and sigmoid colon without definite diverticulitis. Low level diverticulitis can be inapparent at imaging. Vascular/Lymphatic: Normal Reproductive: Previous hysterectomy. Other: No free fluid or air Musculoskeletal: Negative IMPRESSION: No acute finding by CT. No lesions seen to explain gastrointestinal bleeding. The patient does have diverticulosis which can be associated with bleeding. No convincing diverticulitis. Low level diverticulitis can be inapparent at imaging. Question early cirrhosis. Electronically Signed   By: Nelson Chimes M.D.   On:  05/05/2016 18:00   Dg Chest 2 View  Result Date: 05/11/2016 CLINICAL DATA:  Left-sided chest pain extending to the mandible and shoulder. Cough. Elevated troponin. EXAM: CHEST  2 VIEW COMPARISON:  05/11/2016 at 14:11 FINDINGS: Persistent left base opacity is unchanged and could represent pneumonia or evolving pulmonary infarction. Right lung is clear. Pulmonary vasculature is normal. Hilar and mediastinal contours are unremarkable and unchanged. Mild cardiomegaly, unchanged. IMPRESSION: Left base opacity, with considerations including pneumonia, pulmonary infarction, aspiration. Normal pulmonary vasculature. Electronically Signed   By: Andreas Newport M.D.   On: 05/11/2016 19:49   Dg Chest 2 View  Result Date: 05/11/2016 CLINICAL DATA:  Acute onset of shortness of breath and mid chest pain. Initial encounter. EXAM: CHEST  2 VIEW COMPARISON:  Chest radiograph and CTA of the chest performed 04/24/2016 FINDINGS: The lungs are well-aerated. Mild left  basilar airspace opacity raises question for pneumonia. There is no evidence of pleural effusion or pneumothorax. The heart is borderline normal in size. No acute osseous abnormalities are seen. IMPRESSION: Mild left basilar airspace opacity raises question for pneumonia. Residual pulmonary infarct may have a similar appearance, given recent saddle pulmonary embolus. Electronically Signed   By: Garald Balding M.D.   On: 05/11/2016 16:19   Dg Chest 2 View  Result Date: 04/24/2016 CLINICAL DATA:  Cough. History of hypertension and CHF. History of breast cancer. Patient has had loss of function on the right side over body and has difficulty speaking. EXAM: CHEST  2 VIEW COMPARISON:  None. FINDINGS: Normal heart size and pulmonary vascularity. No focal airspace disease or consolidation in the lungs. No blunting of costophrenic angles. No pneumothorax. Mediastinal contours appear intact. Surgical clips in the left axilla. Degenerative changes in the spine.  IMPRESSION: No active cardiopulmonary disease. Electronically Signed   By: Lucienne Capers M.D.   On: 04/24/2016 01:33   Dg Ankle 2 Views Left  Result Date: 05/19/2016 CLINICAL DATA:  Pain for 4 days, worse with bearing weight. No injury. EXAM: LEFT ANKLE - 2 VIEW COMPARISON:  None. FINDINGS: No fracture deformity nor dislocation. The ankle mortise appears congruent and the tibiofibular syndesmosis intact. Mild midfoot osteoarthrosis. No destructive bony lesions. Diffuse soft tissue swelling without subcutaneous gas or radiopaque foreign bodies. Severe vascular calcifications. IMPRESSION: Soft tissue swelling without acute osseous process. Severe vascular calcifications. Electronically Signed   By: Elon Alas M.D.   On: 05/19/2016 14:10   Ct Head Wo Contrast  Result Date: 04/24/2016 CLINICAL DATA:  Acute onset of slurred speech and bilateral leg weakness. Initial encounter. EXAM: CT HEAD WITHOUT CONTRAST TECHNIQUE: Contiguous axial images were obtained from the base of the skull through the vertex without intravenous contrast. COMPARISON:  None. FINDINGS: Brain: No evidence of acute infarction, hemorrhage, hydrocephalus, extra-axial collection or mass lesion/mass effect. Prominence of the ventricles and sulci reflects mild cortical volume loss. Scattered periventricular subcortical white matter change likely reflects small vessel ischemic microangiopathy. A small chronic lacunar infarct is noted at the right basal ganglia. The brainstem and fourth ventricle are within normal limits. The cerebral hemispheres demonstrate grossly normal gray-white differentiation. No mass effect or midline shift is seen. Vascular: No hyperdense vessel or unexpected calcification. Skull: There is no evidence of fracture; visualized osseous structures are unremarkable in appearance. Sinuses/Orbits: The orbits are within normal limits. The paranasal sinuses and mastoid air cells are well-aerated. Other: No significant soft  tissue abnormalities are seen. IMPRESSION: 1. No acute intracranial pathology seen on CT. 2. Mild cortical volume loss and scattered small vessel ischemic microangiopathy. 3. Small chronic lacunar infarct at the right basal ganglia. Electronically Signed   By: Garald Balding M.D.   On: 04/24/2016 01:54   Ct Angio Chest Pe W And/or Wo Contrast  Result Date: 05/11/2016 CLINICAL DATA:  Cough, shortness of breath. Recent saddle pulmonary embolus. Now with LEFT pulmonary infiltrate, assess for infarct. History of breast cancer, diabetes, CHF. EXAM: CT ANGIOGRAPHY CHEST WITH CONTRAST TECHNIQUE: Multidetector CT imaging of the chest was performed using the standard protocol during bolus administration of intravenous contrast. Multiplanar CT image reconstructions and MIPs were obtained to evaluate the vascular anatomy. CONTRAST:  80 cc Isovue 370. COMPARISON:  CT chest April 24, 2016. FINDINGS: CARDIOVASCULAR: Adequate contrast opacification of the pulmonary artery's. Main pulmonary artery is not enlarged. Residual pulmonary emboli LEFT lobar pulmonary artery's, predominately central in distribution and occlusive an  LEFT lower lobe. Dissolution of the saddle embolic component. Residual nonocclusive embolus RIGHT middle lobar artery. RIGHT lower lobe pulmonary artery filling defect is now peripheral compatible with early chronicity, propagating to the segmental artery's without occlusion. Heart size is mildly enlarged, RV/ LV equals 0.9, previously 1.2. No pericardial effusions. Thoracic aorta is normal course and caliber, trace calcific atherosclerosis. MEDIASTINUM/NODES: No lymphadenopathy by CT size criteria. LUNGS/PLEURA: Tracheobronchial tree is patent, no pneumothorax. Bilateral lower lobe and lingular enhancing atelectasis without wedge-like consolidation to suggest infarct. No pleural effusion. UPPER ABDOMEN: Included view of the abdomen is unremarkable. MUSCULOSKELETAL: Visualized soft tissues and included  osseous structures are nonacute. Broad dextroscoliosis. Mild old T3 and T8 for compression fractures. Review of the MIP images confirms the above findings. IMPRESSION: No new pulmonary emboli. Dissolving, decreased clot burden from prior CT. No pulmonary infarcts. Bibasilar atelectasis. Mild cardiomegaly. Borderline, improved RIGHT heart strain (RV/LV 0.9, previously 1.2). Electronically Signed   By: Elon Alas M.D.   On: 05/11/2016 23:07   Ct Angio Chest Pe W And/or Wo Contrast  Result Date: 04/24/2016 CLINICAL DATA:  Cough, chest pain, and shortness of breath for 2 weeks. History of breast cancer post left mastectomy. Chemotherapy. Multiple recent strokes. Diabetes. EXAM: CT ANGIOGRAPHY CHEST WITH CONTRAST TECHNIQUE: Multidetector CT imaging of the chest was performed using the standard protocol during bolus administration of intravenous contrast. Multiplanar CT image reconstructions and MIPs were obtained to evaluate the vascular anatomy. CONTRAST:  100 mL Isovue 370 COMPARISON:  None. FINDINGS: Cardiovascular: Filling defects demonstrated in the main and bilateral lobar pulmonary artery is extending into bilateral upper and lower lobe branches consistent with saddle embolus with moderately large clot burden. The RV to LV ratio is elevated at 1.19, suggesting risk of right heart strain. No pericardial effusion. Dilated ascending thoracic aorta at 3.5 cm. No dissection. Scattered calcification. Mediastinum/Nodes: Small esophageal hiatal hernia. Esophagus is decompressed. No significant lymphadenopathy in the chest. Lungs/Pleura: Evaluation is limited due to motion artifact. There patchy nodular ground-glass infiltrative changes demonstrated centrally in the lungs, in the lung bases, and scattered throughout the upper lungs. This may be due to multifocal pneumonia, edema, or infarcts. Metastatic disease would be a less likely consideration. No pleural effusions. No pneumothorax. Upper Abdomen: No acute  abnormality. Musculoskeletal: Postoperative changes consistent with left mastectomy and surgical clips in the left axilla. No destructive bone lesions. Review of the MIP images confirms the above findings. IMPRESSION: Positive examination for pulmonary embolus, including saddle embolus and bilateral lobar and segmental pulmonary emboli. Moderately large clot burden. Positive for acute PE with CT evidence of right heart strain (RV/LV Ratio = 1.19) consistent with at least submassive (intermediate risk) PE. The presence of right heart strain has been associated with an increased risk of morbidity and mortality. Please activate Code PE by paging [phone]. Patchy nodular ground-glass infiltrative changes in the lungs may be due to multifocal pneumonia, edema, or infarcts. Metastatic disease would be a less likely consideration but due to history of primary carcinoma, non-contrast chest CT at 3-6 months is recommended. If nodules persist, subsequent management will be based upon the most suspicious nodule(s). This recommendation follows the consensus statement: Guidelines for Management of Incidental Pulmonary Nodules Detected on CT Images: From the Fleischner Society 2017; Radiology 2017; 284:228-243. These results were called by telephone at the time of interpretation on 04/24/2016 at 4:26 am to Dr. Deno Etienne , who verbally acknowledged these results. Electronically Signed   By: Lucienne Capers M.D.   On:  04/24/2016 04:32   Mr Brain Wo Contrast  Result Date: 04/24/2016 CLINICAL DATA:  Right-sided facial droop and difficulty speaking, symptoms for 1 week. History of breast cancer currently on chemotherapy. Large saddle embolus on CT chest. Query paradoxical emboli. History of diabetes. EXAM: MRI HEAD WITHOUT CONTRAST MRA HEAD WITHOUT CONTRAST TECHNIQUE: Multiplanar, multiecho pulse sequences of the brain and surrounding structures were obtained without intravenous contrast. Angiographic images of the head were  obtained using MRA technique without contrast. COMPARISON:  CT chest 04/24/2016.  CT head 04/24/2016. FINDINGS: MRI HEAD FINDINGS Brain: No evidence for acute infarction, hemorrhage, mass lesion, hydrocephalus, or extra-axial fluid. Moderate atrophy. Advanced T2 and FLAIR hyperintensities throughout the white matter, representing either diabetic related chronic microvascular ischemic change or post treatment effect from chemotherapy. Scattered areas of lacunar infarction in the deep nuclei and subcortical white matter. Brainstem and cerebellum spared. 9 mm of tonsillar descent below the foramen magnum consistent with Chiari I malformation. No visible hydromyelia. Partial empty sella. Vascular: Normal flow voids. Skull and upper cervical spine: Normal marrow signal. Large disc extrusion at C3-4, evaluated only on sagittal images, causing significant cord compression. Consider cervical spine MRI for further evaluation. Sinuses/Orbits: Negative. Other: None. MRA HEAD FINDINGS The internal carotid arteries are widely patent. Basilar artery is widely patent with vertebrals codominant. There is no intracranial stenosis or visible aneurysm. IMPRESSION: Moderate atrophy with advanced nonacute white matter signal abnormality. No visible acute stroke. Chiari I malformation. 9 mm tonsillar descent without visible hydromyelia. Within limits for assessment on noncontrast brain, no intracranial metastatic disease or visible osseous disease. No intracranial stenosis or occlusion. Electronically Signed   By: Staci Righter M.D.   On: 04/24/2016 13:34   Mr Jodene Nam Head/brain IP Cm  Result Date: 04/24/2016 CLINICAL DATA:  Right-sided facial droop and difficulty speaking, symptoms for 1 week. History of breast cancer currently on chemotherapy. Large saddle embolus on CT chest. Query paradoxical emboli. History of diabetes. EXAM: MRI HEAD WITHOUT CONTRAST MRA HEAD WITHOUT CONTRAST TECHNIQUE: Multiplanar, multiecho pulse sequences of the  brain and surrounding structures were obtained without intravenous contrast. Angiographic images of the head were obtained using MRA technique without contrast. COMPARISON:  CT chest 04/24/2016.  CT head 04/24/2016. FINDINGS: MRI HEAD FINDINGS Brain: No evidence for acute infarction, hemorrhage, mass lesion, hydrocephalus, or extra-axial fluid. Moderate atrophy. Advanced T2 and FLAIR hyperintensities throughout the white matter, representing either diabetic related chronic microvascular ischemic change or post treatment effect from chemotherapy. Scattered areas of lacunar infarction in the deep nuclei and subcortical white matter. Brainstem and cerebellum spared. 9 mm of tonsillar descent below the foramen magnum consistent with Chiari I malformation. No visible hydromyelia. Partial empty sella. Vascular: Normal flow voids. Skull and upper cervical spine: Normal marrow signal. Large disc extrusion at C3-4, evaluated only on sagittal images, causing significant cord compression. Consider cervical spine MRI for further evaluation. Sinuses/Orbits: Negative. Other: None. MRA HEAD FINDINGS The internal carotid arteries are widely patent. Basilar artery is widely patent with vertebrals codominant. There is no intracranial stenosis or visible aneurysm. IMPRESSION: Moderate atrophy with advanced nonacute white matter signal abnormality. No visible acute stroke. Chiari I malformation. 9 mm tonsillar descent without visible hydromyelia. Within limits for assessment on noncontrast brain, no intracranial metastatic disease or visible osseous disease. No intracranial stenosis or occlusion. Electronically Signed   By: Staci Righter M.D.   On: 04/24/2016 13:34     Subjective: - no chest pain, shortness of breath, no abdominal pain, nausea or vomiting.  Discharge Exam: Vitals:   05/22/16 0519 05/22/16 1258  BP: 126/71 121/81  Pulse: 71 73  Resp: 18 20  Temp: 98.3 F (36.8 C) 98 F (36.7 C)   Vitals:   05/21/16  1233 05/21/16 2052 05/22/16 0519 05/22/16 1258  BP: 122/71 117/66 126/71 121/81  Pulse: 66 63 71 73  Resp: '20 18 18 20  '$ Temp: 98.5 F (36.9 C) 98.2 F (36.8 C) 98.3 F (36.8 C) 98 F (36.7 C)  TempSrc: Oral Oral Oral Oral  SpO2: 96% 98% 97% 100%  Weight:   85.6 kg (188 lb 11.2 oz)   Height:        General: Pt is alert, awake, not in acute distress Cardiovascular: RRR, S1/S2 +, no rubs, no gallops Respiratory: CTA bilaterally, no wheezing, no rhonchi Abdominal: Soft, NT, ND, bowel sounds + Extremities: no edema, no cyanosis    The results of significant diagnostics from this hospitalization (including imaging, microbiology, ancillary and laboratory) are listed below for reference.     Microbiology: No results found for this or any previous visit (from the past 240 hour(s)).   Labs: BNP (last 3 results) No results for input(s): BNP in the last 8760 hours. Basic Metabolic Panel:  Recent Labs Lab 05/18/16 0526 05/19/16 0539 05/20/16 0347 05/21/16 0439 05/22/16 0624  NA 137 140 135 137 139  K 3.6 3.9 4.4 3.8 3.9  CL 106 109 105 101 105  CO2 20* 20* 20* 23 23  GLUCOSE 167* 164* 192* 159* 142*  BUN '7 6 10 16 18  '$ CREATININE 0.73 0.79 0.76 1.00 0.80  CALCIUM 8.8* 9.0 9.4 9.4 9.6  MG 1.6* 1.7 1.5* 1.5* 1.7   Liver Function Tests: No results for input(s): AST, ALT, ALKPHOS, BILITOT, PROT, ALBUMIN in the last 168 hours. No results for input(s): LIPASE, AMYLASE in the last 168 hours. No results for input(s): AMMONIA in the last 168 hours. CBC:  Recent Labs Lab 05/16/16 0303 05/17/16 0217 05/18/16 0526 05/19/16 0539 05/20/16 0347  WBC 7.5 8.2 7.5 7.5 8.4  HGB 8.1* 8.0* 8.1* 8.1* 8.3*  HCT 24.5* 25.3* 25.1* 26.4* 26.0*  MCV 93.5 95.1 96.5 97.4 94.9  PLT PLATELET CLUMPS NOTED ON SMEAR, UNABLE TO ESTIMATE 326 324 326 329   Cardiac Enzymes: No results for input(s): CKTOTAL, CKMB, CKMBINDEX, TROPONINI in the last 168 hours. BNP: Invalid input(s):  POCBNP CBG:  Recent Labs Lab 05/21/16 1135 05/21/16 1637 05/21/16 2039 05/22/16 0754 05/22/16 1143  GLUCAP 254* 273* 202* 159* 284*   Time coordinating discharge: 40 minutes  SIGNED:  Marzetta Board, MD  [facility] 05/22/2016, 3:49 PM Pager [phone]  If 7PM-7AM, please contact night-coverage [email or web address] Password [email or web address]

## 2016-05-22 NOTE — Care Management Note (Signed)
Case Management Note  Patient Details  Name: Carrie Watts MRN: [id number] Date of Birth: Jul 23, 1950  Subjective/Objective:                  CVA,  Atrial fibrillation Action/Plan: Discharge planning Expected Discharge Date:  05/22/16               Expected Discharge Plan:  Home/Self Care  In-House Referral:     Discharge planning Services  CM Consult  Post Acute Care Choice:    Choice offered to:  Adult Children  DME Arranged:  N/A DME Agency:  NA  HH Arranged:  NA HH Agency:  NA  Status of Service:  Completed, signed off  If discussed at [place] of Stay Meetings, dates discussed:    Additional Comments: CM met with pt and with pt's permission spoke with daughter, Carrie Watts and gave family following information: HHPT not a charitable discipline (and family declines out of pocket); [facility] pt for follow up care and once this appointment is made, the pt will have access to the [facility] for medication asst; [place] letter to help defray cost of discharge prescriptions; Free 30 day Xarelto card.  Daughter Carrie Watts will provide transportation and verbalized understanding of above actions. No other CM needs were communicated. Dellie Catholic, RN 05/22/2016, 1:14 PM

## 2016-05-23 ENCOUNTER — Ambulatory Visit: Payer: Self-pay | Admitting: Family Medicine

## 2016-05-23 ENCOUNTER — Encounter: Payer: Self-pay | Admitting: Family Medicine

## 2016-05-23 ENCOUNTER — Ambulatory Visit (INDEPENDENT_AMBULATORY_CARE_PROVIDER_SITE_OTHER): Payer: Medicare Other | Admitting: Family Medicine

## 2016-05-23 VITALS — BP 121/74 | HR 82 | Temp 98.3°F | Resp 18 | Ht 63.0 in | Wt 190.0 lb

## 2016-05-23 DIAGNOSIS — I10 Essential (primary) hypertension: Secondary | ICD-10-CM | POA: Diagnosis not present

## 2016-05-23 DIAGNOSIS — R531 Weakness: Secondary | ICD-10-CM | POA: Diagnosis not present

## 2016-05-23 DIAGNOSIS — R269 Unspecified abnormalities of gait and mobility: Secondary | ICD-10-CM

## 2016-05-23 DIAGNOSIS — E785 Hyperlipidemia, unspecified: Secondary | ICD-10-CM | POA: Diagnosis not present

## 2016-05-23 DIAGNOSIS — R110 Nausea: Secondary | ICD-10-CM

## 2016-05-23 DIAGNOSIS — I2692 Saddle embolus of pulmonary artery without acute cor pulmonale: Secondary | ICD-10-CM | POA: Diagnosis not present

## 2016-05-23 DIAGNOSIS — E119 Type 2 diabetes mellitus without complications: Secondary | ICD-10-CM

## 2016-05-23 LAB — POCT URINALYSIS DIP (DEVICE)
Bilirubin Urine: NEGATIVE
GLUCOSE, UA: NEGATIVE mg/dL
HGB URINE DIPSTICK: NEGATIVE
KETONES UR: NEGATIVE mg/dL
Nitrite: NEGATIVE
Protein, ur: NEGATIVE mg/dL
SPECIFIC GRAVITY, URINE: 1.01 (ref 1.005–1.030)
UROBILINOGEN UA: 0.2 mg/dL (ref 0.0–1.0)
pH: 6 (ref 5.0–8.0)

## 2016-05-23 LAB — GLUCOSE, CAPILLARY: Glucose-Capillary: 88 mg/dL (ref 65–99)

## 2016-05-23 LAB — POCT GLYCOSYLATED HEMOGLOBIN (HGB A1C): HEMOGLOBIN A1C: 6.5

## 2016-05-23 MED ORDER — ONDANSETRON HCL 4 MG PO TABS: 4.0000 mg | ORAL_TABLET | Freq: Three times a day (TID) | ORAL | 1 refills | Status: DC | PRN

## 2016-05-23 NOTE — Progress Notes (Signed)
Subjective:    Patient ID: Carrie Watts, female    DOB: [birth year], 66 y.o.   MRN: [id number]  HPI  Ms. Carrie Watts, a 66 year old female with a history of DMII, hypertension,  DVT, PE, and breast cancer presents accompanied by daughter to establish care. Patient recently relocated from [place]. She was recently diagnosed with a saddle pulmonary embolism. She has been taking Eliquis for PE. She was admitted to inpatient services on 05/11/2016 for chest pain. She developed chest pain and stroke like symptoms. She was having frequent flares of left sided, sharp chest pains prior to inpatient admission. Patient also has a history of left breast cancer and is followed by Dr. Griffith Watts.    Patient has a history of DM II. She has been taking Metformin. While in the hospital, she was on SSI. Hemoglobin a1C in the hospital was 12.9. She denies foot ulcerations, increase appetite, nausea, paresthesia of the feet, polydipsia, polyuria, visual disturbances, vomitting and weight loss. She also has a history of hypertension.  She is not exercising and is not adherent to low salt diet.  Patient does not check blood pressure at home.  Patient denies irregular heart beat, lower extremity edema, orthopnea and palpitations.  Cardiovascular risk factors include: dyslipidemia and hypertension.  Past Medical History:  Diagnosis Date  . Angina of effort ([place])   . Atrial fibrillation ([place])   . Cancer (HCC)    breast  . CHF (congestive heart failure) ([place])   . Deep venous thrombosis ([place])   . Diabetes mellitus without complication ([place])   . Hypercholesterolemia   . Hypertension   . Pulmonary embolism ([address])   . Renal disorder   . Stroke [facility])    Social History   Social History  . Marital status: Divorced    Spouse name: N/A  . Number of children: N/A  . Years of education: N/A   Occupational History  . Not on file.   Social History Main Topics  . Smoking status: Never Smoker  . Smokeless tobacco: Never  Used  . Alcohol use No  . Drug use: No  . Sexual activity: Not on file   Other Topics Concern  . Not on file   Social History Narrative  . No narrative on file   Immunization History  Administered Date(s) Administered  . Influenza,inj,Quad PF,36+ Mos 05/15/2016  . Pneumococcal Polysaccharide-23 05/15/2016     Review of Systems  Constitutional: Negative for unexpected weight change.  HENT: Negative.   Eyes: Negative.  Negative for visual disturbance.  Respiratory: Negative.  Negative for shortness of breath.   Cardiovascular: Negative.  Negative for chest pain, palpitations and leg swelling.  Gastrointestinal: Negative.   Endocrine: Negative.  Negative for cold intolerance, heat intolerance, polydipsia, polyphagia and polyuria.  Genitourinary: Negative.   Musculoskeletal: Positive for myalgias.       Ambulating with assistance.   Skin: Negative.   Allergic/Immunologic: Negative.  Negative for environmental allergies and immunocompromised state.  Neurological: Positive for weakness.  Hematological: Negative.   Psychiatric/Behavioral: Positive for suicidal ideas.       Objective:   Physical Exam  Constitutional: She is oriented to person, place, and time. She appears well-developed and well-nourished.  HENT:  Head: Normocephalic and atraumatic.  Right Ear: External ear normal.  Left Ear: External ear normal.  Nose: Nose normal.  Mouth/Throat: Oropharynx is clear and moist.  Eyes: Conjunctivae and EOM are normal. Pupils are equal, round, and reactive to light.  Neck: Normal range  of motion. Neck supple.  Cardiovascular: Normal rate, normal heart sounds and intact distal pulses.   Pulmonary/Chest: Effort normal and breath sounds normal.  S/P mastectomy, post surgical scar; tender to palpation  Abdominal: Soft. Bowel sounds are normal.  Musculoskeletal:  Generalized weakness Ambulating with assistance    2/5 weakness to upper extremities  Neurological: She is alert  and oriented to person, place, and time. She has normal reflexes.  Monofilament test negative  Skin: Skin is warm and dry. No rash noted.  Hyperpigmentation to fingernails and toenails  Psychiatric: She has a normal mood and affect. Her behavior is normal. Judgment and thought content normal.     BP 121/74 (BP Location: Right Arm, Patient Position: Sitting, Cuff Size: Large)   Pulse 82   Temp 98.3 F (36.8 C) (Oral)   Resp 18   Ht 5\' 3"  (1.6 m)   Wt 190 lb (86.2 kg)   SpO2 99%   BMI 33.66 kg/m  Assessment & Plan:  1. Diabetes mellitus without complication (HCC) Current hemoglobin a1C is 6.5, will continue Metformin and carbohydrate modified diet. Discussed diet at length.  - HgB A1c - CBC with Differential; Future - COMPLETE METABOLIC PANEL WITH GFR; Future - Lipid Panel; Future  2. Essential hypertension Blood pressure is at goal on current medication regimen - CBC with Differential; Future - COMPLETE METABOLIC PANEL WITH GFR; Future  3. Hyperlipidemia, unspecified hyperlipidemia type - Lipid Panel; Future  4. Nausea - ondansetron (ZOFRAN) 4 MG tablet; Take 1 tablet (4 mg total) by mouth every 8 (eight) hours as needed for nausea or vomiting.  Dispense: 30 tablet; Refill: 1  5. Acute saddle pulmonary embolism without acute cor pulmonale (HCC) Continue Eliquis BID as previously prescribed   6. Weakness generalized Patient warrants an outpatient PT evaluation. She also will need a home evaluation. She is a high fall risk at home and is having difficulty with ADLs. She is in the process of applying for financial assistance. Will send appropriate referrals upon approval.   7. Abnormality of gait Refer to #7. Patient appears deconditioned.  Recommended moving throw rugs.    RTC: 2 weeks for labs and 1 month for chronic conditions.  Norvil Martensen M, FNP    The patient was given clear instructions to go to ER or return to medical center if symptoms do not improve, worsen  or new problems develop. The patient verbalized understanding.

## 2016-05-23 NOTE — Patient Instructions (Addendum)
Diabetes and Foot Care Diabetes may cause you to have problems because of poor blood supply (circulation) to your feet and legs. This may cause the skin on your feet to become thinner, break easier, and heal more slowly. Your skin may become dry, and the skin may peel and crack. You may also have nerve damage in your legs and feet causing decreased feeling in them. You may not notice minor injuries to your feet that could lead to infections or more serious problems. Taking care of your feet is one of the most important things you can do for yourself. Follow these instructions at home:  Wear shoes at all times, even in the house. Do not go barefoot. Bare feet are easily injured.  Check your feet daily for blisters, cuts, and redness. If you cannot see the bottom of your feet, use a mirror or ask someone for help.  Wash your feet with warm water (do not use hot water) and mild soap. Then pat your feet and the areas between your toes until they are completely dry. Do not soak your feet as this can dry your skin.  Apply a moisturizing lotion or petroleum jelly (that does not contain alcohol and is unscented) to the skin on your feet and to dry, brittle toenails. Do not apply lotion between your toes.  Trim your toenails straight across. Do not dig under them or around the cuticle. File the edges of your nails with an emery board or nail file.  Do not cut corns or calluses or try to remove them with medicine.  Wear clean socks or stockings every day. Make sure they are not too tight. Do not wear knee-high stockings since they may decrease blood flow to your legs.  Wear shoes that fit properly and have enough cushioning. To break in new shoes, wear them for just a few hours a day. This prevents you from injuring your feet. Always look in your shoes before you put them on to be sure there are no objects inside.  Do not cross your legs. This may decrease the blood flow to your feet.  If you find a  minor scrape, cut, or break in the skin on your feet, keep it and the skin around it clean and dry. These areas may be cleansed with mild soap and water. Do not cleanse the area with peroxide, alcohol, or iodine.  When you remove an adhesive bandage, be sure not to damage the skin around it.  If you have a wound, look at it several times a day to make sure it is healing.  Do not use heating pads or hot water bottles. They may burn your skin. If you have lost feeling in your feet or legs, you may not know it is happening until it is too late.  Make sure your health care provider performs a complete foot exam at least annually or more often if you have foot problems. Report any cuts, sores, or bruises to your health care provider immediately. Contact a health care provider if:  You have an injury that is not healing.  You have cuts or breaks in the skin.  You have an ingrown nail.  You notice redness on your legs or feet.  You feel burning or tingling in your legs or feet.  You have pain or cramps in your legs and feet.  Your legs or feet are numb.  Your feet always feel cold. Get help right away if:  There is increasing   redness, swelling, or pain in or around a wound.  There is a red line that goes up your leg.  Pus is coming from a wound.  You develop a fever or as directed by your health care provider.  You notice a bad smell coming from an ulcer or wound. This information is not intended to replace advice given to you by your health care provider. Make sure you discuss any questions you have with your health care provider. Document Released: 03/25/2000 Document Revised: 09/03/2015 Document Reviewed: 09/04/2012 Elsevier Interactive Patient Education  2017 [place]. Diabetes Mellitus and Food It is important for you to manage your blood sugar (glucose) level. Your blood glucose level can be greatly affected by what you eat. Eating healthier foods in the appropriate  amounts throughout the day at about the same time each day will help you control your blood glucose level. It can also help slow or prevent worsening of your diabetes mellitus. Healthy eating may even help you improve the level of your blood pressure and reach or maintain a healthy weight. General recommendations for healthful eating and cooking habits include:  Eating meals and snacks regularly. Avoid going long periods of time without eating to lose weight.  Eating a diet that consists mainly of plant-based foods, such as fruits, vegetables, nuts, legumes, and whole grains.  Using low-heat cooking methods, such as baking, instead of high-heat cooking methods, such as deep frying. Work with your dietitian to make sure you understand how to use the Nutrition Facts information on food labels. How can food affect me? Carbohydrates  Carbohydrates affect your blood glucose level more than any other type of food. Your dietitian will help you determine how many carbohydrates to eat at each meal and teach you how to count carbohydrates. Counting carbohydrates is important to keep your blood glucose at a healthy level, especially if you are using insulin or taking certain medicines for diabetes mellitus. Alcohol  Alcohol can cause sudden decreases in blood glucose (hypoglycemia), especially if you use insulin or take certain medicines for diabetes mellitus. Hypoglycemia can be a life-threatening condition. Symptoms of hypoglycemia (sleepiness, dizziness, and disorientation) are similar to symptoms of having too much alcohol. If your health care provider has given you approval to drink alcohol, do so in moderation and use the following guidelines:  Women should not have more than one drink per day, and men should not have more than two drinks per day. One drink is equal to:  12 oz of beer.  5 oz of wine.  1 oz of hard liquor.  Do not drink on an empty stomach.  Keep yourself hydrated. Have water,  diet soda, or unsweetened iced tea.  Regular soda, juice, and other mixers might contain a lot of carbohydrates and should be counted. What foods are not recommended? As you make food choices, it is important to remember that all foods are not the same. Some foods have fewer nutrients per serving than other foods, even though they might have the same number of calories or carbohydrates. It is difficult to get your body what it needs when you eat foods with fewer nutrients. Examples of foods that you should avoid that are high in calories and carbohydrates but low in nutrients include:  Trans fats (most processed foods list trans fats on the Nutrition Facts label).  Regular soda.  Juice.  Candy.  Sweets, such as cake, pie, doughnuts, and cookies.  Fried foods. What foods can I eat? Eat nutrient-rich foods,  which will nourish your body and keep you healthy. The food you should eat also will depend on several factors, including:  The calories you need.  The medicines you take.  Your weight.  Your blood glucose level.  Your blood pressure level.  Your cholesterol level. You should eat a variety of foods, including:  Protein.  Lean cuts of meat.  Proteins low in saturated fats, such as fish, egg whites, and beans. Avoid processed meats.  Fruits and vegetables.  Fruits and vegetables that may help control blood glucose levels, such as apples, mangoes, and yams.  Dairy products.  Choose fat-free or low-fat dairy products, such as milk, yogurt, and cheese.  Grains, bread, pasta, and rice.  Choose whole grain products, such as multigrain bread, whole oats, and brown rice. These foods may help control blood pressure.  Fats.  Foods containing healthful fats, such as nuts, avocado, olive oil, canola oil, and fish. Does everyone with diabetes mellitus have the same meal plan? Because every person with diabetes mellitus is different, there is not one meal plan that works for  everyone. It is very important that you meet with a dietitian who will help you create a meal plan that is just right for you. This information is not intended to replace advice given to you by your health care provider. Make sure you discuss any questions you have with your health care provider. Document Released: 12/23/2004 Document Revised: 09/03/2015 Document Reviewed: 02/22/2013 Elsevier Interactive Patient Education  2017 [place].  How to Avoid Diabetes Mellitus Problems You can take action to prevent or slow down problems that are caused by diabetes (diabetes mellitus). Following your diabetes plan and taking care of yourself can reduce your risk of serious or life-threatening complications. Manage your diabetes  Follow instructions from your health care providers about managing your diabetes. Your diabetes may be managed by a team of health care providers who can teach you how to care for yourself and can answer questions that you have.  Educate yourself about your condition so you can make healthy choices about eating and physical activity.  Check your blood sugar (glucose) levels as often as directed. Your health care provider will help you decide how often to check your blood glucose level depending on your treatment goals and how well you are meeting them.  Ask your health care provider if you should take low-dose aspirin daily and what dose is recommended for you. Taking low-dose aspirin daily is recommended to help prevent cardiovascular disease. Do not use nicotine or tobacco Do not use any products that contain nicotine or tobacco, such as cigarettes and e-cigarettes. If you need help quitting, ask your health care provider. Nicotine raises your risk for diabetes problems. If you quit using nicotine:  You will lower your risk for heart attack, stroke, nerve disease, and kidney disease.  Your cholesterol and blood pressure may improve.  Your blood circulation will  improve. Keep your blood pressure under control To control your blood pressure:  Follow instructions from your health care provider about meal planning, exercise, and medicines.  Make sure your health care provider checks your blood pressure at every medical visit. A blood pressure reading consists of two numbers. Generally, the goal is to keep your top number (systolic pressure) at or below 140, and your bottom number (diastolic pressure) at or below 90. Your health care provider may recommend a lower target blood pressure. Your individualized target blood pressure is determined based on:  Your age.  Your medicines.  How long you have had diabetes.  Any other medical conditions you have. Keep your cholesterol under control To control your cholesterol:  Follow instructions from your health care provider about meal planning, exercise, and medicines.  Have your cholesterol checked at least once a year.  You may be prescribed medicine to lower cholesterol (statin). If you are not taking a statin, ask your health care provider if you should be. Controlling your cholesterol may:  Help prevent heart disease and stroke. These are the most common health problems for people with diabetes.  Improve your blood flow. Schedule and keep yearly physical exams and eye exams Your health care provider will tell you how often you need medical visits depending on your diabetes management plan. Keep all follow-up visits as directed. This is important so possible problems can be identified early and complications can be avoided or treated.  Every visit with your health care provider should include measuring your:  Weight.  Blood pressure.  Blood glucose control.  Your A1c (hemoglobin A1c) level should be checked:  At least 2 times a year, if you are meeting your treatment goals.  4 times a year, if you are not meeting treatment goals or if your treatment goals have changed.  Your blood lipids  (lipid profile) should be checked yearly. You should also be checked yearly for protein in your urine (urine microalbumin).  If you have type 1 diabetes, get an eye exam 3-5 years after you are diagnosed, and then once a year after your first exam.  If you have type 2 diabetes, get an eye exam as soon as you are diagnosed, and then once a year after your first exam. Keep your vaccines current It is recommended that you receive:  A flu (influenza) vaccine every year.  A pneumonia (pneumococcal) vaccine and a hepatitis B vaccine. If you are age 46 or older, you may get the pneumonia vaccine as a series of two separate shots. Ask your health care provider which other vaccines may be recommended. Take care of your feet Diabetes may cause you to have poor blood circulation to your legs and feet. Because of this, taking care of your feet is very important. Diabetes can cause:  The skin on the feet to get thinner, break more easily, and heal more slowly.  Nerve damage in your legs and feet, which results in decreased feeling. You may not notice minor injuries that could lead to serious problems. To avoid foot problems:  Check your skin and feet every day for cuts, bruises, redness, blisters, or sores.  Schedule a foot exam with your health care provider once every year. This exam includes:  Inspecting of the structure and skin of your feet.  Checking the pulses and sensation in your feet.  Make sure that your health care provider performs a visual foot exam at every medical visit. Take care of your teeth People with poorly controlled diabetes are more likely to have gum (periodontal) disease. Diabetes can make periodontal diseases harder to control. If not treated, periodontal diseases can lead to tooth loss. To prevent this:  Brush your teeth twice a day.  Floss at least once a day.  Visit your dentist 2 times a year. Drink responsibly Limit alcohol intake to no more than 1 drink a  day for nonpregnant women and 2 drinks a day for men. One drink equals 12 oz of beer, 5 oz of wine, or 1 oz of hard liquor. It is important  to eat food when you drink alcohol to avoid low blood glucose (hypoglycemia). Avoid alcohol if you:  Have a history of alcohol abuse or dependence.  Are pregnant.  Have liver disease, pancreatitis, advanced neuropathy, or severe hypertriglyceridemia. Lessen stress Living with diabetes can be stressful. When you are experiencing stress, your blood glucose may be affected in two ways:  Stress hormones may cause your blood glucose to rise.  You may be distracted from taking good care of yourself. Be aware of your stress level and make changes to help you manage challenging situations. To lower your stress levels:  Consider joining a support group.  Do planned relaxation or meditation.  Do a hobby that you enjoy.  Maintain healthy relationships.  Exercise regularly.  Work with your health care provider or a mental health professional. Summary  You can take action to prevent or slow down problems that are caused by diabetes (diabetes mellitus). Following your diabetes plan and taking care of yourself can reduce your risk of serious or life-threatening complications.  Follow instructions from your health care providers about managing your diabetes. Your diabetes may be managed by a team of health care providers who can teach you how to care for yourself and can answer questions that you have.  Your health care provider will tell you how often you need medical visits depending on your diabetes management plan. Keep all follow-up visits as directed. This is important so possible problems can be identified early and complications can be avoided or treated. This information is not intended to replace advice given to you by your health care provider. Make sure you discuss any questions you have with your health care provider. Document Released: 12/14/2010  Document Revised: 12/26/2015 Document Reviewed: 12/26/2015 Elsevier Interactive Patient Education  2017 [employer].

## 2016-05-27 ENCOUNTER — Emergency Department (HOSPITAL_COMMUNITY): Payer: Medicare Other

## 2016-05-27 ENCOUNTER — Inpatient Hospital Stay (HOSPITAL_COMMUNITY)
Admission: EM | Admit: 2016-05-27 | Discharge: 2016-05-29 | DRG: 690 | Disposition: A | Discharge status: Home / Self Care | Payer: Medicare Other | Attending: Internal Medicine | Admitting: Internal Medicine

## 2016-05-27 ENCOUNTER — Emergency Department (HOSPITAL_COMMUNITY): Admission: RE | Admit: 2016-05-27 | Payer: Medicare Other | Source: Ambulatory Visit

## 2016-05-27 ENCOUNTER — Encounter (HOSPITAL_COMMUNITY): Payer: Self-pay | Admitting: Neurology

## 2016-05-27 DIAGNOSIS — I110 Hypertensive heart disease with heart failure: Secondary | ICD-10-CM | POA: Diagnosis not present

## 2016-05-27 DIAGNOSIS — Z86711 Personal history of pulmonary embolism: Secondary | ICD-10-CM

## 2016-05-27 DIAGNOSIS — R109 Unspecified abdominal pain: Secondary | ICD-10-CM | POA: Diagnosis present

## 2016-05-27 DIAGNOSIS — Z86718 Personal history of other venous thrombosis and embolism: Secondary | ICD-10-CM | POA: Diagnosis not present

## 2016-05-27 DIAGNOSIS — I959 Hypotension, unspecified: Secondary | ICD-10-CM | POA: Diagnosis not present

## 2016-05-27 DIAGNOSIS — Z170 Estrogen receptor positive status [ER+]: Secondary | ICD-10-CM

## 2016-05-27 DIAGNOSIS — B9620 Unspecified Escherichia coli [E. coli] as the cause of diseases classified elsewhere: Secondary | ICD-10-CM | POA: Diagnosis not present

## 2016-05-27 DIAGNOSIS — I2699 Other pulmonary embolism without acute cor pulmonale: Secondary | ICD-10-CM | POA: Diagnosis present

## 2016-05-27 DIAGNOSIS — N179 Acute kidney failure, unspecified: Secondary | ICD-10-CM | POA: Diagnosis not present

## 2016-05-27 DIAGNOSIS — C50912 Malignant neoplasm of unspecified site of left female breast: Secondary | ICD-10-CM | POA: Diagnosis present

## 2016-05-27 DIAGNOSIS — I5042 Chronic combined systolic (congestive) and diastolic (congestive) heart failure: Secondary | ICD-10-CM | POA: Diagnosis not present

## 2016-05-27 DIAGNOSIS — Z90710 Acquired absence of both cervix and uterus: Secondary | ICD-10-CM | POA: Diagnosis not present

## 2016-05-27 DIAGNOSIS — I509 Heart failure, unspecified: Secondary | ICD-10-CM | POA: Insufficient documentation

## 2016-05-27 DIAGNOSIS — Z7984 Long term (current) use of oral hypoglycemic drugs: Secondary | ICD-10-CM

## 2016-05-27 DIAGNOSIS — Z7901 Long term (current) use of anticoagulants: Secondary | ICD-10-CM | POA: Diagnosis not present

## 2016-05-27 DIAGNOSIS — I5022 Chronic systolic (congestive) heart failure: Secondary | ICD-10-CM | POA: Diagnosis not present

## 2016-05-27 DIAGNOSIS — Z79899 Other long term (current) drug therapy: Secondary | ICD-10-CM | POA: Diagnosis not present

## 2016-05-27 DIAGNOSIS — Z9010 Acquired absence of unspecified breast and nipple: Secondary | ICD-10-CM

## 2016-05-27 DIAGNOSIS — Z8673 Personal history of transient ischemic attack (TIA), and cerebral infarction without residual deficits: Secondary | ICD-10-CM | POA: Diagnosis not present

## 2016-05-27 DIAGNOSIS — I2692 Saddle embolus of pulmonary artery without acute cor pulmonale: Secondary | ICD-10-CM | POA: Diagnosis not present

## 2016-05-27 DIAGNOSIS — Z8249 Family history of ischemic heart disease and other diseases of the circulatory system: Secondary | ICD-10-CM | POA: Diagnosis not present

## 2016-05-27 DIAGNOSIS — C50812 Malignant neoplasm of overlapping sites of left female breast: Secondary | ICD-10-CM

## 2016-05-27 DIAGNOSIS — N390 Urinary tract infection, site not specified: Secondary | ICD-10-CM | POA: Diagnosis not present

## 2016-05-27 DIAGNOSIS — A419 Sepsis, unspecified organism: Secondary | ICD-10-CM | POA: Diagnosis not present

## 2016-05-27 DIAGNOSIS — R531 Weakness: Secondary | ICD-10-CM

## 2016-05-27 DIAGNOSIS — R7989 Other specified abnormal findings of blood chemistry: Secondary | ICD-10-CM | POA: Diagnosis present

## 2016-05-27 DIAGNOSIS — R740 Nonspecific elevation of levels of transaminase and lactic acid dehydrogenase [LDH]: Secondary | ICD-10-CM | POA: Diagnosis not present

## 2016-05-27 DIAGNOSIS — E1165 Type 2 diabetes mellitus with hyperglycemia: Secondary | ICD-10-CM | POA: Diagnosis present

## 2016-05-27 DIAGNOSIS — R031 Nonspecific low blood-pressure reading: Secondary | ICD-10-CM | POA: Diagnosis not present

## 2016-05-27 DIAGNOSIS — Z9889 Other specified postprocedural states: Secondary | ICD-10-CM | POA: Diagnosis not present

## 2016-05-27 DIAGNOSIS — I2782 Chronic pulmonary embolism: Secondary | ICD-10-CM

## 2016-05-27 DIAGNOSIS — Z79811 Long term (current) use of aromatase inhibitors: Secondary | ICD-10-CM

## 2016-05-27 DIAGNOSIS — R262 Difficulty in walking, not elsewhere classified: Secondary | ICD-10-CM

## 2016-05-27 DIAGNOSIS — E119 Type 2 diabetes mellitus without complications: Secondary | ICD-10-CM | POA: Diagnosis not present

## 2016-05-27 DIAGNOSIS — R1031 Right lower quadrant pain: Secondary | ICD-10-CM | POA: Diagnosis not present

## 2016-05-27 LAB — GLUCOSE, CAPILLARY: Glucose-Capillary: 194 mg/dL — ABNORMAL HIGH (ref 65–99)

## 2016-05-27 LAB — COMPREHENSIVE METABOLIC PANEL
ALK PHOS: 64 U/L (ref 38–126)
ALT: 27 U/L (ref 14–54)
AST: 27 U/L (ref 15–41)
Albumin: 3.1 g/dL — ABNORMAL LOW (ref 3.5–5.0)
Anion gap: 13 (ref 5–15)
BUN: 30 mg/dL — ABNORMAL HIGH (ref 6–20)
CALCIUM: 9.8 mg/dL (ref 8.9–10.3)
CHLORIDE: 100 mmol/L — AB (ref 101–111)
CO2: 21 mmol/L — ABNORMAL LOW (ref 22–32)
CREATININE: 1.6 mg/dL — AB (ref 0.44–1.00)
GFR, EST AFRICAN AMERICAN: 37 mL/min — AB (ref >60)
GFR, EST NON AFRICAN AMERICAN: 32 mL/min — AB (ref >60)
Glucose, Bld: 262 mg/dL — ABNORMAL HIGH (ref 65–99)
Potassium: 5.2 mmol/L — ABNORMAL HIGH (ref 3.5–5.1)
Sodium: 134 mmol/L — ABNORMAL LOW (ref 135–145)
TOTAL PROTEIN: 7.2 g/dL (ref 6.5–8.1)
Total Bilirubin: 0.8 mg/dL (ref 0.3–1.2)

## 2016-05-27 LAB — I-STAT CHEM 8, ED
BUN: 34 mg/dL — ABNORMAL HIGH (ref 6–20)
CALCIUM ION: 1.29 mmol/L (ref 1.15–1.40)
CHLORIDE: 100 mmol/L — AB (ref 101–111)
CREATININE: 1.5 mg/dL — AB (ref 0.44–1.00)
GLUCOSE: 265 mg/dL — AB (ref 65–99)
HCT: 34 % — ABNORMAL LOW (ref 36.0–46.0)
Hemoglobin: 11.6 g/dL — ABNORMAL LOW (ref 12.0–15.0)
Potassium: 5 mmol/L (ref 3.5–5.1)
Sodium: 133 mmol/L — ABNORMAL LOW (ref 135–145)
TCO2: 25 mmol/L (ref 0–100)

## 2016-05-27 LAB — CBC WITH DIFFERENTIAL/PLATELET
BASOS ABS: 0 10*3/uL (ref 0.0–0.1)
BASOS PCT: 0 %
EOS ABS: 0.1 10*3/uL (ref 0.0–0.7)
Eosinophils Relative: 1 %
HCT: 32.8 % — ABNORMAL LOW (ref 36.0–46.0)
HEMOGLOBIN: 10.5 g/dL — AB (ref 12.0–15.0)
Lymphocytes Relative: 7 %
Lymphs Abs: 0.9 10*3/uL (ref 0.7–4.0)
MCH: 30.4 pg (ref 26.0–34.0)
MCHC: 32 g/dL (ref 30.0–36.0)
MCV: 95.1 fL (ref 78.0–100.0)
Monocytes Absolute: 0.3 10*3/uL (ref 0.1–1.0)
Monocytes Relative: 2 %
Neutro Abs: 12.3 10*3/uL — ABNORMAL HIGH (ref 1.7–7.7)
Neutrophils Relative %: 90 %
Platelets: 360 10*3/uL (ref 150–400)
RBC: 3.45 MIL/uL — AB (ref 3.87–5.11)
RDW: 16.6 % — ABNORMAL HIGH (ref 11.5–15.5)
WBC: 13.5 10*3/uL — AB (ref 4.0–10.5)

## 2016-05-27 LAB — INFLUENZA PANEL BY PCR (TYPE A & B)
INFLAPCR: NEGATIVE
INFLBPCR: NEGATIVE

## 2016-05-27 LAB — URINALYSIS, ROUTINE W REFLEX MICROSCOPIC
Bilirubin Urine: NEGATIVE
GLUCOSE, UA: NEGATIVE mg/dL
KETONES UR: NEGATIVE mg/dL
NITRITE: NEGATIVE
PH: 5 (ref 5.0–8.0)
PROTEIN: NEGATIVE mg/dL
Specific Gravity, Urine: 1.008 (ref 1.005–1.030)
Squamous Epithelial / LPF: NONE SEEN

## 2016-05-27 LAB — I-STAT CG4 LACTIC ACID, ED
LACTIC ACID, VENOUS: 2.69 mmol/L — AB (ref 0.5–1.9)
Lactic Acid, Venous: 3.08 mmol/L (ref 0.5–1.9)

## 2016-05-27 LAB — CBG MONITORING, ED
GLUCOSE-CAPILLARY: 238 mg/dL — AB (ref 65–99)
Glucose-Capillary: 253 mg/dL — ABNORMAL HIGH (ref 65–99)

## 2016-05-27 LAB — I-STAT TROPONIN, ED: Troponin i, poc: 0.04 ng/mL (ref 0.00–0.08)

## 2016-05-27 LAB — TSH: TSH: 0.564 u[IU]/mL (ref 0.350–4.500)

## 2016-05-27 MED ORDER — ONDANSETRON HCL 4 MG/2ML IJ SOLN: 4.0000 mg | Freq: Four times a day (QID) | INTRAMUSCULAR | Status: DC | PRN

## 2016-05-27 MED ORDER — TRAZODONE HCL 50 MG PO TABS: 25.0000 mg | ORAL_TABLET | Freq: Every evening | ORAL | Status: DC | PRN

## 2016-05-27 MED ORDER — DEXTROSE 5 % IV SOLN
1.0000 g | INTRAVENOUS | Status: DC
  Administered 2016-05-27 – 2016-05-28 (×2): 1 g via INTRAVENOUS
  Filled 2016-05-27 (×3): qty 10

## 2016-05-27 MED ORDER — INSULIN ASPART 100 UNIT/ML ~~LOC~~ SOLN
0.0000 [IU] | Freq: Three times a day (TID) | SUBCUTANEOUS | Status: DC
  Administered 2016-05-27: 8 [IU] via SUBCUTANEOUS
  Administered 2016-05-27: 3 [IU] via SUBCUTANEOUS
  Administered 2016-05-28: 2 [IU] via SUBCUTANEOUS
  Administered 2016-05-28 (×2): 5 [IU] via SUBCUTANEOUS
  Administered 2016-05-29: 3 [IU] via SUBCUTANEOUS
  Filled 2016-05-27: qty 1

## 2016-05-27 MED ORDER — ACETAMINOPHEN 325 MG PO TABS: 650.0000 mg | ORAL_TABLET | Freq: Four times a day (QID) | ORAL | Status: DC | PRN

## 2016-05-27 MED ORDER — IOPAMIDOL (ISOVUE-300) INJECTION 61%
INTRAVENOUS | Status: AC
  Administered 2016-05-27: 75 mL via INTRAVENOUS
  Filled 2016-05-27: qty 75

## 2016-05-27 MED ORDER — COLCHICINE 0.6 MG PO TABS: 0.6000 mg | ORAL_TABLET | Freq: Every day | ORAL | Status: DC

## 2016-05-27 MED ORDER — FOLIC ACID 800 MCG PO TABS: 800.0000 ug | ORAL_TABLET | Freq: Every day | ORAL | Status: DC

## 2016-05-27 MED ORDER — INSULIN ASPART 100 UNIT/ML ~~LOC~~ SOLN: 0.0000 [IU] | Freq: Every day | SUBCUTANEOUS | Status: DC

## 2016-05-27 MED ORDER — SODIUM CHLORIDE 0.9 % IV BOLUS (SEPSIS)
2000.0000 mL | Freq: Once | INTRAVENOUS | Status: AC
  Administered 2016-05-27: 2000 mL via INTRAVENOUS

## 2016-05-27 MED ORDER — FOLIC ACID 1 MG PO TABS
1.0000 mg | ORAL_TABLET | Freq: Every day | ORAL | Status: DC
  Administered 2016-05-28 – 2016-05-29 (×2): 1 mg via ORAL
  Filled 2016-05-27 (×2): qty 1

## 2016-05-27 MED ORDER — APIXABAN 5 MG PO TABS
5.0000 mg | ORAL_TABLET | Freq: Two times a day (BID) | ORAL | Status: DC
  Administered 2016-05-27 – 2016-05-29 (×4): 5 mg via ORAL
  Filled 2016-05-27 (×4): qty 1

## 2016-05-27 MED ORDER — SODIUM CHLORIDE 0.9 % IV SOLN
INTRAVENOUS | Status: AC
  Administered 2016-05-27: 12:00:00 via INTRAVENOUS

## 2016-05-27 MED ORDER — KETOROLAC TROMETHAMINE 15 MG/ML IJ SOLN: 15.0000 mg | Freq: Four times a day (QID) | INTRAMUSCULAR | Status: DC | PRN

## 2016-05-27 MED ORDER — PREDNISONE 20 MG PO TABS: 10.0000 mg | ORAL_TABLET | Freq: Every day | ORAL | Status: DC

## 2016-05-27 MED ORDER — IOPAMIDOL (ISOVUE-300) INJECTION 61%
INTRAVENOUS | Status: AC
  Filled 2016-05-27: qty 100

## 2016-05-27 MED ORDER — FAMOTIDINE 20 MG PO TABS
10.0000 mg | ORAL_TABLET | Freq: Every day | ORAL | Status: DC
  Administered 2016-05-27 – 2016-05-29 (×3): 10 mg via ORAL
  Filled 2016-05-27 (×3): qty 1

## 2016-05-27 MED ORDER — PREDNISONE 20 MG PO TABS
10.0000 mg | ORAL_TABLET | Freq: Every day | ORAL | Status: DC
  Administered 2016-05-28 – 2016-05-29 (×2): 10 mg via ORAL
  Filled 2016-05-27 (×2): qty 1

## 2016-05-27 MED ORDER — ATORVASTATIN CALCIUM 20 MG PO TABS
20.0000 mg | ORAL_TABLET | Freq: Every day | ORAL | Status: DC
  Administered 2016-05-27 – 2016-05-29 (×2): 20 mg via ORAL
  Filled 2016-05-27 (×2): qty 1

## 2016-05-27 MED ORDER — INSULIN ASPART 100 UNIT/ML ~~LOC~~ SOLN: 7.0000 [IU] | Freq: Once | SUBCUTANEOUS | Status: DC

## 2016-05-27 MED ORDER — SODIUM CHLORIDE 0.9% FLUSH
3.0000 mL | Freq: Two times a day (BID) | INTRAVENOUS | Status: DC
  Administered 2016-05-28 – 2016-05-29 (×3): 3 mL via INTRAVENOUS

## 2016-05-27 MED ORDER — PIPERACILLIN-TAZOBACTAM 3.375 G IVPB 30 MIN
3.3750 g | Freq: Once | INTRAVENOUS | Status: AC
  Administered 2016-05-27: 3.375 g via INTRAVENOUS
  Filled 2016-05-27: qty 50

## 2016-05-27 MED ORDER — ANASTROZOLE 1 MG PO TABS
1.0000 mg | ORAL_TABLET | Freq: Every day | ORAL | Status: DC
  Administered 2016-05-28 – 2016-05-29 (×2): 1 mg via ORAL
  Filled 2016-05-27 (×2): qty 1

## 2016-05-27 MED ORDER — APIXABAN 5 MG PO TABS: 5.0000 mg | ORAL_TABLET | Freq: Every day | ORAL | Status: DC

## 2016-05-27 MED ORDER — ACETAMINOPHEN 650 MG RE SUPP: 650.0000 mg | Freq: Four times a day (QID) | RECTAL | Status: DC | PRN

## 2016-05-27 MED ORDER — ONDANSETRON HCL 4 MG PO TABS: 4.0000 mg | ORAL_TABLET | Freq: Four times a day (QID) | ORAL | Status: DC | PRN

## 2016-05-27 MED ORDER — PANTOPRAZOLE SODIUM 40 MG PO TBEC
40.0000 mg | DELAYED_RELEASE_TABLET | Freq: Every day | ORAL | Status: DC
Start: 2016-05-27 — End: 2016-05-29
  Administered 2016-05-27 – 2016-05-29 (×3): 40 mg via ORAL
  Filled 2016-05-27 (×3): qty 1

## 2016-05-27 MED ORDER — FLEET ENEMA 7-19 GM/118ML RE ENEM: 1.0000 | ENEMA | Freq: Once | RECTAL | Status: DC | PRN

## 2016-05-27 NOTE — ED Triage Notes (Signed)
Per ems[facility] here for "general sickness" and weakness. Pt is alert, but lethargic. CBG 240. Unable to palpate BP, monitor reading 70/50, weak radials. Unable to get IV

## 2016-05-27 NOTE — Progress Notes (Signed)
Daughter left message for nurse to call back. Attempted to call, but unable to do so d/t long distance.

## 2016-05-27 NOTE — ED Notes (Signed)
CBG-[id number]  

## 2016-05-27 NOTE — Progress Notes (Signed)
PT Cancellation Note  Patient Details Name: Carrie Watts MRN: [id number] DOB: [birth year]   Cancelled Treatment:    Reason Eval/Treat Not Completed: Other (comment) Pt just got up to floor. RN doing admission and pt just ambulated from hallways to bed. Lab tech came to draw blood as well. PT to return as able to complete eval.   Akylah Hascall M Ranferi Clingan 05/27/2016, 3:28 PM   Kittie Plater, PT, DPT Pager #: [phone] Office #: [phone]

## 2016-05-27 NOTE — H&P (Signed)
History and Physical    Carrie Watts [id number] DOB: 1951-01-20 DOA: 05/27/2016  PCP: Carrie Dew, FNP Patient coming from: home  Chief Complaint: generalized weakness/hypotension  HPI: Carrie Watts is a very pleasant 66 y.o. female with medical history significant for breast cancer, recent saddle PE and DVT, new diabetes, CHF presents to the emergency department chief complaint of generalized weakness. Initial evaluation reveals hypotension leukocytosis urinalysis consistent with UTI.  Information is obtained from the chart and the patient. She states she went home from the hospital 4 days ago after an 11 day stay. She states "the first day when okay" and then she started feeling "weak". She denies headache fever chills nausea vomiting chest pain palpitation shortness of breath. She does report a decreased oral intake as she does not feel "hungry or thirsty". He reports compliance with her medications. She also complains of abdominal pain intermittent located in the lower quadrants described as sharp. She denies lower extremity edema or orthopnea. He denies dysuria hematuria frequency or urgency.   ED Course: Emergency department rectal temp is 99, systolic blood pressures 93 heart rate is 54 oxygen saturation level 99% on room air. She is provided with 2 L of normal saline Zosyn. Time of admission she is hemodynamically stable and not hypoxic nontoxic appearing  Review of Systems: As per HPI otherwise 10 point review of systems negative.   Ambulatory Status: Has required a walker since her discharge 4 days ago. Also has a history of gout left ankle  Past Medical History:  Diagnosis Date  . Angina of effort ([place])   . Atrial fibrillation ([place])   . Cancer (HCC)    breast  . CHF (congestive heart failure) ([place])   . Deep venous thrombosis ([place])   . Diabetes mellitus without complication ([place])   . Hypercholesterolemia   . Hypertension   . Pulmonary embolism ([place])   . Renal  disorder   . Stroke [facility])     Past Surgical History:  Procedure Laterality Date  . BREAST SURGERY    . COLONOSCOPY N/A 05/16/2016   Procedure: COLONOSCOPY;  Surgeon: [name], MD;  Location: [facility];  Service: Endoscopy;  Laterality: N/A;  . ESOPHAGOGASTRODUODENOSCOPY N/A 05/16/2016   Procedure: ESOPHAGOGASTRODUODENOSCOPY (EGD);  Surgeon: [name], MD;  Location: [facility];  Service: Endoscopy;  Laterality: N/A;  . LEFT HEART CATH AND CORONARY ANGIOGRAPHY N/A 05/18/2016   Procedure: Left Heart Cath and Coronary Angiography;  Surgeon: Jettie Booze, MD;  Location: [facility];  Service: Cardiovascular;  Laterality: N/A;  . PARTIAL HYSTERECTOMY      Social History   Social History  . Marital status: Divorced    Spouse name: N/A  . Number of children: N/A  . Years of education: N/A   Occupational History  . Not on file.   Social History Main Topics  . Smoking status: Never Smoker  . Smokeless tobacco: Never Used  . Alcohol use No  . Drug use: No  . Sexual activity: Not on file   Other Topics Concern  . Not on file   Social History Narrative  . No narrative on file    Allergies  Allergen Reactions  . Dust Mite Extract     Family History  Problem Relation Age of Onset  . Heart attack Mother   . Clotting disorder Neg Hx     Prior to Admission medications   Medication Sig Start Date End Date Taking? Authorizing Provider  anastrozole (ARIMIDEX) 1 MG  tablet Take 1 tablet (1 mg total) by mouth daily. 05/22/16   Carrie Karlyne Greenspan, MD  apixaban (ELIQUIS) 5 MG TABS tablet Take 1 tablet (5 mg total) by mouth 2 (two) times daily. Patient taking differently: Take 5 mg by mouth daily.  05/11/16   Thurnell Lose, MD  atorvastatin (LIPITOR) 20 MG tablet Take 1 tablet (20 mg total) by mouth at bedtime. Can switch to a cheaper statin if needed 05/03/16   Thurnell Lose, MD  carvedilol (COREG) 12.5 MG tablet Take 1 tablet (12.5 mg total) by mouth 2  (two) times daily with a meal. 05/22/16   Carrie Karlyne Greenspan, MD  colchicine 0.6 MG tablet Take 1 tablet (0.6 mg total) by mouth daily. 05/22/16   Jefferson, MD  folic acid (FOLVITE) 366 MCG tablet Take 800 mcg by mouth daily.    Historical Provider, MD  furosemide (LASIX) 40 MG tablet Take 1 tablet (40 mg total) by mouth daily. 05/22/16   Carrie Karlyne Greenspan, MD  glucose monitoring kit (FREESTYLE) monitoring kit 1 each by Does not apply route 4 (four) times daily - after meals and at bedtime. 1 month Diabetic Testing Supplies for QAC-QHS accuchecks.  Diagnosis E11.65. Any brand OK 05/03/16   Thurnell Lose, MD  losartan (COZAAR) 50 MG tablet Take 1 tablet (50 mg total) by mouth daily. 05/22/16   Carrie Karlyne Greenspan, MD  metFORMIN (GLUCOPHAGE) 1000 MG tablet Take 1 tablet (1,000 mg total) by mouth 2 (two) times daily with a meal. 05/03/16   Thurnell Lose, MD  Multiple Vitamin (MULTIVITAMIN WITH MINERALS) TABS tablet Take 1 tablet by mouth daily.    Historical Provider, MD  omeprazole (PRILOSEC) 20 MG capsule Take 20 mg by mouth daily.    Historical Provider, MD  ondansetron (ZOFRAN) 4 MG tablet Take 1 tablet (4 mg total) by mouth every 8 (eight) hours as needed for nausea or vomiting. 05/23/16   Carrie Dew, FNP  potassium chloride (K-DUR,KLOR-CON) 10 MEQ tablet Take 1 tablet (10 mEq total) by mouth daily. 05/22/16   Carrie Karlyne Greenspan, MD  predniSONE (DELTASONE) 10 MG tablet Take 1 tablet (10 mg total) by mouth daily with breakfast. 2 tablets daily x 4 days then 1 daily x 3 days 05/22/16   Caren Griffins, MD  ranitidine (ZANTAC) 300 MG tablet Take 300 mg by mouth at bedtime.    Historical Provider, MD    Physical Exam: Vitals:   05/27/16 1043 05/27/16 1100 05/27/16 1115 05/27/16 1130  BP: 93/62 100/67 115/66 118/68  Pulse: (!) 54 66 66 63  Resp: '15 18 14 17  '$ Temp:      TempSrc:      SpO2: 99% 100% 100% 98%     General:  Appears calm and comfortable, no acute distress Eyes:  PERRL, EOMI,  normal lids, iris ENT:  grossly normal hearing, lips & tongue, mucous membranes of her mouth are pink but dry Neck:  no LAD, masses or thyromegaly Cardiovascular:  RRR, no m/r/g. No LE edema.  Respiratory:  Normal effort breath sounds somewhat diminished respirations slightly shallow no rhonchi no wheeze Abdomen:  soft, ntnd, is a bowel sounds throughout mild tenderness to palpation in lower quadrants Skin:  no rash or induration seen on limited exam Musculoskeletal:  grossly normal tone BUE/BLE, good ROM, no bony abnormality Psychiatric:  grossly normal mood and affect, speech fluent and appropriate, AOx3 Neurologic:  CN 2-12 grossly intact, moves all extremities in coordinated fashion,  sensation intact. Speech clear facial symmetry  Labs on Admission: I have personally reviewed following labs and imaging studies  CBC:  Recent Labs Lab 05/27/16 0830 05/27/16 0904  WBC 13.5*  --   NEUTROABS 12.3*  --   HGB 10.5* 11.6*  HCT 32.8* 34.0*  MCV 95.1  --   PLT 360  --    Basic Metabolic Panel:  Recent Labs Lab 05/21/16 0439 05/22/16 0624 05/27/16 0830 05/27/16 0904  NA 137 139 134* 133*  K 3.8 3.9 5.2* 5.0  CL 101 105 100* 100*  CO2 23 23 21*  --   GLUCOSE 159* 142* 262* 265*  BUN 16 18 30* 34*  CREATININE 1.00 0.80 1.60* 1.50*  CALCIUM 9.4 9.6 9.8  --   MG 1.5* 1.7  --   --    GFR: Estimated Creatinine Clearance: 37.9 mL/min (by C-G formula based on SCr of 1.5 mg/dL (H)). Liver Function Tests:  Recent Labs Lab 05/27/16 0830  AST 27  ALT 27  ALKPHOS 64  BILITOT 0.8  PROT 7.2  ALBUMIN 3.1*   No results for input(s): LIPASE, AMYLASE in the last 168 hours. No results for input(s): AMMONIA in the last 168 hours. Coagulation Profile: No results for input(s): INR, PROTIME in the last 168 hours. Cardiac Enzymes: No results for input(s): CKTOTAL, CKMB, CKMBINDEX, TROPONINI in the last 168 hours. BNP (last 3 results) No results for input(s): PROBNP in the last 8760  hours. HbA1C: No results for input(s): HGBA1C in the last 72 hours. CBG:  Recent Labs Lab 05/21/16 1637 05/21/16 2039 05/22/16 0754 05/22/16 1143 05/23/16 1400  GLUCAP 273* 202* 159* 284* 88   Lipid Profile: No results for input(s): CHOL, HDL, LDLCALC, TRIG, CHOLHDL, LDLDIRECT in the last 72 hours. Thyroid Function Tests: No results for input(s): TSH, T4TOTAL, FREET4, T3FREE, THYROIDAB in the last 72 hours. Anemia Panel: No results for input(s): VITAMINB12, FOLATE, FERRITIN, TIBC, IRON, RETICCTPCT in the last 72 hours. Urine analysis:    Component Value Date/Time   COLORURINE YELLOW 05/27/2016 0853   APPEARANCEUR CLEAR 05/27/2016 0853   LABSPEC 1.008 05/27/2016 0853   PHURINE 5.0 05/27/2016 0853   GLUCOSEU NEGATIVE 05/27/2016 0853   HGBUR MODERATE (A) 05/27/2016 0853   BILIRUBINUR NEGATIVE 05/27/2016 0853   KETONESUR NEGATIVE 05/27/2016 0853   PROTEINUR NEGATIVE 05/27/2016 0853   UROBILINOGEN 0.2 05/23/2016 1348   NITRITE NEGATIVE 05/27/2016 0853   LEUKOCYTESUR SMALL (A) 05/27/2016 0853    Creatinine Clearance: Estimated Creatinine Clearance: 37.9 mL/min (by C-G formula based on SCr of 1.5 mg/dL (H)).  Sepsis Labs: '@LABRCNTIP'$ (procalcitonin:4,lacticidven:4) )No results found for this or any previous visit (from the past 240 hour(s)).   Radiological Exams on Admission: Ct Abdomen Pelvis W Contrast  Result Date: 05/27/2016 CLINICAL DATA:  Right lower quadrant pain. History of breast cancer. Hypotension. EXAM: CT ABDOMEN AND PELVIS WITH CONTRAST TECHNIQUE: Multidetector CT imaging of the abdomen and pelvis was performed using the standard protocol following bolus administration of intravenous contrast. CONTRAST:  68m ISOVUE-300 IOPAMIDOL (ISOVUE-300) INJECTION 61% COMPARISON:  05/05/2016 FINDINGS: Lower chest: Airspace disease noted in the left lower lobe, increased since prior study. Right basilar atelectasis or scarring. No effusions. Heart is normal size. Hepatobiliary:  No focal hepatic abnormality. Gallbladder unremarkable. Pancreas: No focal abnormality or ductal dilatation. Spleen: No focal abnormality.  Normal size. Adrenals/Urinary Tract: No adrenal abnormality. No focal renal abnormality. No stones or hydronephrosis. Urinary bladder is unremarkable. Stomach/Bowel: Appendix is normal. Sigmoid diverticulosis. No active diverticulitis. Stomach and small  bowel unremarkable. Vascular/Lymphatic: No evidence of aneurysm or adenopathy. Reproductive: Prior hysterectomy.  No adnexal masses. Other: No free fluid or free air. Small bilateral inguinal hernias containing fat. Musculoskeletal: No acute bony abnormality. IMPRESSION: No acute findings in the abdomen or pelvis. Sigmoid diverticulosis. Normal appendix. Increasing atelectasis or infiltrate at the left lung base. Electronically Signed   By: Rolm Baptise M.D.   On: 05/27/2016 10:28   Dg Chest Port 1 View  Result Date: 05/27/2016 CLINICAL DATA:  Acute presentation with sepsis.  Weakness. EXAM: PORTABLE CHEST 1 VIEW COMPARISON:  05/11/2016 FINDINGS: Heart size is normal by with left ventricular prominence. There is aortic atherosclerosis. Right lung is clear. Patchy density previously seen in the retrocardiac left lower lobe appears improved today but not completely clear, probably. No worsening or new finding. IMPRESSION: Improved aeration at the left base, by this single view. Electronically Signed   By: Nelson Chimes M.D.   On: 05/27/2016 08:43    EKG: Independently reviewed. Sinus rhythm Nonspecific intraventricular conduction delay Consider anterior infarct No significant change since last tracing  Assessment/Plan Principal Problem:   UTI (urinary tract infection) Active Problems:   Diabetes mellitus without complication (HCC)   Malignant neoplasm of overlapping sites of left breast in female, estrogen receptor positive (HCC)   Generalized weakness   Elevated lactic acid level   Acute kidney injury ([place])    Pulmonary embolism (HCC)   Abdominal pain   #1. Urinary tract infection. Patient admits to decreased oral intake since her discharge from the hospital 4 days ago. Provided with Zosyn -Admit to telemetry -Follow urine culture -Rocephin per pharmacy -Follow lactic acid -Continue IV fluids -Monitor closely  2. Hypotension. I clearly related to a decreased oral intake over the last 4 days since her discharge in the setting of beta blocker diuretic ARB. Systolic blood pressure reportedly 70s at the clinic. Patient provided with IV fluids at the point of discharge systolic blood pressure 132 -Hold home antihypertensives for now -Continue IV fluids judiciously -Monitor blood pressure closely -Resume home antihypertensives as indicated  3. Acute kidney injury. Related to above. -IV fluids -Hold nephrotoxins -Monitor urine output -Recheck in the morning  #4. Elevated lactic acid. Likely related to above. Initial lactic acid 3.0. She is afebrile blood pressure low end of normal WBCs 13. She is provided with IV fluids and Zosyn in the emergency department -Continue IV fluids -Track lactic acid -Rocephin per pharmacy  #5. Diabetes uncontrolled. Most recent hemoglobin A1c greater than 12. Serum glucose 265 on admission -Hold metformin for now -7 units of NovoLog now -Sliding scale insulin for optimal control -Carb modified diet  #6. Abdominal pain. Related to UTI. CT of the abdomen pelvis unremarkable. -See above -supportive measures  -#7. History of PE/DVT. Medications include eliquis. Question regarding compliance -Per pharmacy -Case manager to assist with meds  8. Breast cancer. Status post mastectomy. Will by Dr. Griffith Citron  9. Generalized weakness. Multifactorial. Chart review indicates this appears to be a chronic problem. Appears deconditioned as well now recovering from recent acute illness. -Follow influenza panel -See above -Physical therapy  #10. Chronic systolic heart  failure. Recent echo with mild LVH EF of 44% grade 1 diastolic dysfunction. Recent hospitalization she was evaluated by cardiology and went to cardiac catheterization revealing no significant obstruction. At that time she was placed on Coreg Lipitor and losartan. It was noted her CHF may be related doxorubicin she received 2 months ago for her breast cancer. Currently appears compensated. Medications do  include Coreg, Lasix, losartan. -Holding these for now as noted above -Daily weights -Intake and output - resume home medications as indicated    DVT prophylaxis: eliquid  Code Status: full  Family Communication: none present  Disposition Plan: home may benefit short term snf  Consults called: none  Admission status: obs    Dyanne Carrel M MD [facility]  If 7PM-7AM, please contact night-coverage [email or web address] Password [email or web address]  05/27/2016, 11:40 AM

## 2016-05-27 NOTE — ED Notes (Signed)
  CBG 253

## 2016-05-27 NOTE — ED Provider Notes (Signed)
[facility] DEPT Provider Note   CSN: [id number] Arrival date & time: 05/27/16  8466     History   Chief Complaint Chief Complaint  Patient presents with  . Hypotension    HPI Carrie Watts is a 66 y.o. female.  66 yo F who is just felt generally unwell for the past couple days. Patient is unable to quantify her symptoms. Denies cough denies fevers or chills denies abdominal pain vomiting or diarrhea. Denies decreased oral intake. Denies increased lower extremity edema. Denies shortness of breath denies chest pain.   The history is provided by the patient.  Illness  This is a new problem. The current episode started 2 days ago. The problem occurs constantly. The problem has not changed since onset.Pertinent negatives include no chest pain, no headaches and no shortness of breath. Nothing aggravates the symptoms. Nothing relieves the symptoms. She has tried nothing for the symptoms. The treatment provided no relief.    Past Medical History:  Diagnosis Date  . Angina of effort ([place])   . Atrial fibrillation ([place])   . Cancer (HCC)    breast  . CHF (congestive heart failure) ([place])   . Deep venous thrombosis ([place])   . Diabetes mellitus without complication ([place])   . Hypercholesterolemia   . Hypertension   . Pulmonary embolism ([place])   . Renal disorder   . Stroke [facility])     Patient Active Problem List   Diagnosis Date Noted  . Generalized weakness 05/27/2016  . Elevated lactic acid level 05/27/2016  . UTI (urinary tract infection) 05/27/2016  . Acute kidney injury ([place]) 05/27/2016  . Abdominal pain 05/27/2016  . Pulmonary embolism ([place])   . CHF (congestive heart failure) ([place])   . Essential hypertension 05/23/2016  . Benign neoplasm of ascending colon   . Uncontrolled type 2 diabetes mellitus with complication ([place])   . Acute systolic CHF (congestive heart failure) ([place])   . Pulmonary hypertension   . Primary cancer of left breast with stage 2 nodal metastasis per  American Joint Committee on Cancer 7th edition (N2) ([place])   . Abnormal CT scan, liver   . Occult blood in stools   . Abnormal echocardiogram   . Elevated troponin   . Hypoxia 05/12/2016  . Symptomatic anemia 05/11/2016  . Malignant neoplasm of overlapping sites of left breast in female, estrogen receptor positive ([place]) 05/11/2016  . Acute deep vein thrombosis (DVT) of right lower extremity ([place])   . Hyperlipidemia   . Saddle pulmonary embolus ([place]) 04/24/2016  . Diabetes mellitus without complication ([place]) 59/93/5701  . Stroke-like symptoms 04/24/2016  . Acute saddle pulmonary embolism with acute cor pulmonale ([place]) 04/24/2016  . History of cancer of left breast 04/24/2016  . Pressure injury of skin 04/24/2016    Past Surgical History:  Procedure Laterality Date  . BREAST SURGERY    . COLONOSCOPY N/A 05/16/2016   Procedure: COLONOSCOPY;  Surgeon: [name], MD;  Location: [facility];  Service: Endoscopy;  Laterality: N/A;  . ESOPHAGOGASTRODUODENOSCOPY N/A 05/16/2016   Procedure: ESOPHAGOGASTRODUODENOSCOPY (EGD);  Surgeon: [name], MD;  Location: [facility];  Service: Endoscopy;  Laterality: N/A;  . LEFT HEART CATH AND CORONARY ANGIOGRAPHY N/A 05/18/2016   Procedure: Left Heart Cath and Coronary Angiography;  Surgeon: Jettie Booze, MD;  Location: [facility] CV LAB;  Service: Cardiovascular;  Laterality: N/A;  . PARTIAL HYSTERECTOMY      OB History    No data available  Home Medications    Prior to Admission medications   Medication Sig Start Date End Date Taking? Authorizing Provider  anastrozole (ARIMIDEX) 1 MG tablet Take 1 tablet (1 mg total) by mouth daily. 05/22/16   [place] Karlyne Greenspan, MD  apixaban (ELIQUIS) 5 MG TABS tablet Take 1 tablet (5 mg total) by mouth 2 (two) times daily. Patient taking differently: Take 5 mg by mouth daily.  05/11/16   Thurnell Lose, MD  atorvastatin (LIPITOR) 20 MG tablet Take 1 tablet (20 mg total) by mouth at  bedtime. Can switch to a cheaper statin if needed 05/03/16   Thurnell Lose, MD  carvedilol (COREG) 12.5 MG tablet Take 1 tablet (12.5 mg total) by mouth 2 (two) times daily with a meal. 05/22/16   Costin Karlyne Greenspan, MD  colchicine 0.6 MG tablet Take 1 tablet (0.6 mg total) by mouth daily. 05/22/16   [place], MD  folic acid (FOLVITE) 768 MCG tablet Take 800 mcg by mouth daily.    Historical Provider, MD  furosemide (LASIX) 40 MG tablet Take 1 tablet (40 mg total) by mouth daily. 05/22/16   Costin Karlyne Greenspan, MD  glucose monitoring kit (FREESTYLE) monitoring kit 1 each by Does not apply route 4 (four) times daily - after meals and at bedtime. 1 month Diabetic Testing Supplies for QAC-QHS accuchecks.  Diagnosis E11.65. Any brand OK 05/03/16   Thurnell Lose, MD  losartan (COZAAR) 50 MG tablet Take 1 tablet (50 mg total) by mouth daily. 05/22/16   Costin Karlyne Greenspan, MD  metFORMIN (GLUCOPHAGE) 1000 MG tablet Take 1 tablet (1,000 mg total) by mouth 2 (two) times daily with a meal. 05/03/16   Thurnell Lose, MD  Multiple Vitamin (MULTIVITAMIN WITH MINERALS) TABS tablet Take 1 tablet by mouth daily.    Historical Provider, MD  omeprazole (PRILOSEC) 20 MG capsule Take 20 mg by mouth daily.    Historical Provider, MD  ondansetron (ZOFRAN) 4 MG tablet Take 1 tablet (4 mg total) by mouth every 8 (eight) hours as needed for nausea or vomiting. 05/23/16   Dorena Dew, FNP  potassium chloride (K-DUR,KLOR-CON) 10 MEQ tablet Take 1 tablet (10 mEq total) by mouth daily. 05/22/16   Costin Karlyne Greenspan, MD  predniSONE (DELTASONE) 10 MG tablet Take 1 tablet (10 mg total) by mouth daily with breakfast. 2 tablets daily x 4 days then 1 daily x 3 days 05/22/16   Caren Griffins, MD  ranitidine (ZANTAC) 300 MG tablet Take 300 mg by mouth at bedtime.    Historical Provider, MD    Family History Family History  Problem Relation Age of Onset  . Heart attack Mother   . Clotting disorder Neg Hx     Social  History Social History  Substance Use Topics  . Smoking status: Never Smoker  . Smokeless tobacco: Never Used  . Alcohol use No     Allergies   Dust mite extract   Review of Systems Review of Systems  Constitutional: Positive for fatigue. Negative for chills and fever.  HENT: Negative for congestion and rhinorrhea.   Eyes: Negative for redness and visual disturbance.  Respiratory: Negative for shortness of breath and wheezing.   Cardiovascular: Negative for chest pain and palpitations.  Gastrointestinal: Negative for nausea and vomiting.  Genitourinary: Negative for dysuria and urgency.  Musculoskeletal: Negative for arthralgias and myalgias.  Skin: Negative for pallor and wound.  Neurological: Negative for dizziness and headaches.     Physical Exam  Updated Vital Signs BP 112/75   Pulse 78   Temp 97.8 F (36.6 C) (Oral)   Resp 14   SpO2 100%   Physical Exam  Constitutional: She is oriented to person, place, and time. She appears well-developed and well-nourished. No distress.  HENT:  Head: Normocephalic and atraumatic.  Eyes: EOM are normal. Pupils are equal, round, and reactive to light.  Neck: Normal range of motion. Neck supple.  Cardiovascular: Normal rate and regular rhythm.  Exam reveals no gallop and no friction rub.   No murmur heard. Pulmonary/Chest: Effort normal. She has no wheezes. She has no rales.  Abdominal: Soft. She exhibits no distension and no mass. There is no tenderness. There is no guarding.  Musculoskeletal: She exhibits no edema or tenderness.  Neurological: She is alert and oriented to person, place, and time.  Skin: Skin is warm and dry. She is not diaphoretic.  Psychiatric: She has a normal mood and affect. Her behavior is normal.  Nursing note and vitals reviewed.    ED Treatments / Results  Labs (all labs ordered are listed, but only abnormal results are displayed) Labs Reviewed  COMPREHENSIVE METABOLIC PANEL - Abnormal; Notable  for the following:       Result Value   Sodium 134 (*)    Potassium 5.2 (*)    Chloride 100 (*)    CO2 21 (*)    Glucose, Bld 262 (*)    BUN 30 (*)    Creatinine, Ser 1.60 (*)    Albumin 3.1 (*)    GFR calc non Af Amer 32 (*)    GFR calc Af Amer 37 (*)    All other components within normal limits  CBC WITH DIFFERENTIAL/PLATELET - Abnormal; Notable for the following:    WBC 13.5 (*)    RBC 3.45 (*)    Hemoglobin 10.5 (*)    HCT 32.8 (*)    RDW 16.6 (*)    Neutro Abs 12.3 (*)    All other components within normal limits  URINALYSIS, ROUTINE W REFLEX MICROSCOPIC - Abnormal; Notable for the following:    Hgb urine dipstick MODERATE (*)    Leukocytes, UA SMALL (*)    Bacteria, UA MANY (*)    All other components within normal limits  I-STAT CG4 LACTIC ACID, ED - Abnormal; Notable for the following:    Lactic Acid, Venous 3.08 (*)    All other components within normal limits  I-STAT CHEM 8, ED - Abnormal; Notable for the following:    Sodium 133 (*)    Chloride 100 (*)    BUN 34 (*)    Creatinine, Ser 1.50 (*)    Glucose, Bld 265 (*)    Hemoglobin 11.6 (*)    HCT 34.0 (*)    All other components within normal limits  I-STAT CG4 LACTIC ACID, ED - Abnormal; Notable for the following:    Lactic Acid, Venous 2.69 (*)    All other components within normal limits  CBG MONITORING, ED - Abnormal; Notable for the following:    Glucose-Capillary 238 (*)    All other components within normal limits  CBG MONITORING, ED - Abnormal; Notable for the following:    Glucose-Capillary 253 (*)    All other components within normal limits  CULTURE, BLOOD (ROUTINE X 2)  CULTURE, BLOOD (ROUTINE X 2)  URINE CULTURE  INFLUENZA PANEL BY PCR (TYPE A & B)  TSH  I-STAT TROPOININ, ED    EKG  EKG Interpretation  Date/Time:  Friday May 27 2016 08:24:02 EST Ventricular Rate:  64 PR Interval:    QRS Duration: 121 QT Interval:  421 QTC Calculation: 435 R Axis:   -34 Text Interpretation:   Sinus rhythm Nonspecific intraventricular conduction delay Consider anterior infarct No significant change since last tracing Confirmed by Taliana Mersereau MD, DANIEL [phone]) on 05/27/2016 8:54:11 AM       Radiology Ct Abdomen Pelvis W Contrast  Result Date: 05/27/2016 CLINICAL DATA:  Right lower quadrant pain. History of breast cancer. Hypotension. EXAM: CT ABDOMEN AND PELVIS WITH CONTRAST TECHNIQUE: Multidetector CT imaging of the abdomen and pelvis was performed using the standard protocol following bolus administration of intravenous contrast. CONTRAST:  20m ISOVUE-300 IOPAMIDOL (ISOVUE-300) INJECTION 61% COMPARISON:  05/05/2016 FINDINGS: Lower chest: Airspace disease noted in the left lower lobe, increased since prior study. Right basilar atelectasis or scarring. No effusions. Heart is normal size. Hepatobiliary: No focal hepatic abnormality. Gallbladder unremarkable. Pancreas: No focal abnormality or ductal dilatation. Spleen: No focal abnormality.  Normal size. Adrenals/Urinary Tract: No adrenal abnormality. No focal renal abnormality. No stones or hydronephrosis. Urinary bladder is unremarkable. Stomach/Bowel: Appendix is normal. Sigmoid diverticulosis. No active diverticulitis. Stomach and small bowel unremarkable. Vascular/Lymphatic: No evidence of aneurysm or adenopathy. Reproductive: Prior hysterectomy.  No adnexal masses. Other: No free fluid or free air. Small bilateral inguinal hernias containing fat. Musculoskeletal: No acute bony abnormality. IMPRESSION: No acute findings in the abdomen or pelvis. Sigmoid diverticulosis. Normal appendix. Increasing atelectasis or infiltrate at the left lung base. Electronically Signed   By: KRolm BaptiseM.D.   On: 05/27/2016 10:28   Dg Chest Port 1 View  Result Date: 05/27/2016 CLINICAL DATA:  Acute presentation with sepsis.  Weakness. EXAM: PORTABLE CHEST 1 VIEW COMPARISON:  05/11/2016 FINDINGS: Heart size is normal by with left ventricular prominence. There is  aortic atherosclerosis. Right lung is clear. Patchy density previously seen in the retrocardiac left lower lobe appears improved today but not completely clear, probably. No worsening or new finding. IMPRESSION: Improved aeration at the left base, by this single view. Electronically Signed   By: MNelson ChimesM.D.   On: 05/27/2016 08:43    Procedures Procedures (including critical care time)  Medications Ordered in ED Medications  anastrozole (ARIMIDEX) tablet 1 mg (not administered)  colchicine tablet 0.6 mg (not administered)  predniSONE (DELTASONE) tablet 10 mg (not administered)  apixaban (ELIQUIS) tablet 5 mg (not administered)  atorvastatin (LIPITOR) tablet 20 mg (not administered)  folic acid (FOLVITE) tablet 800 mcg (not administered)  pantoprazole (PROTONIX) EC tablet 40 mg (not administered)  famotidine (PEPCID) tablet 10 mg (not administered)  sodium chloride flush (NS) 0.9 % injection 3 mL (not administered)  0.9 %  sodium chloride infusion ( Intravenous New Bag/Given 05/27/16 1150)  acetaminophen (TYLENOL) tablet 650 mg (not administered)    Or  acetaminophen (TYLENOL) suppository 650 mg (not administered)  ketorolac (TORADOL) 15 MG/ML injection 15 mg (not administered)  traZODone (DESYREL) tablet 25 mg (not administered)  ondansetron (ZOFRAN) tablet 4 mg (not administered)    Or  ondansetron (ZOFRAN) injection 4 mg (not administered)  sodium phosphate (FLEET) 7-19 GM/118ML enema 1 enema (not administered)  insulin aspart (novoLOG) injection 0-15 Units (8 Units Subcutaneous Given 05/27/16 1354)  insulin aspart (novoLOG) injection 0-5 Units (not administered)  cefTRIAXone (ROCEPHIN) 1 g in dextrose 5 % 50 mL IVPB (1 g Intravenous New Bag/Given 05/27/16 1148)  sodium chloride 0.9 % bolus 2,000 mL (0 mLs Intravenous Stopped 05/27/16 1151)  piperacillin-tazobactam (  ZOSYN) IVPB 3.375 g (0 g Intravenous Stopped 05/27/16 1104)  iopamidol (ISOVUE-300) 61 % injection (75 mLs Intravenous  Contrast Given 05/27/16 1000)     Initial Impression / Assessment and Plan / ED Course  I have reviewed the triage vital signs and the nursing notes.  Pertinent labs & imaging results that were available during my care of the patient were reviewed by me and considered in my medical decision making (see chart for details).     66 yo F With a chief complaint of weakness. Patient at arrival to clinic found to be hypotensive. Was sent to the ED for further evaluation. Patient with no other specific signs or symptoms. Will obtain a septic workup influenza swab give 2 L of fluids and reassess.  Patient with elevated lactic acid.  UA with possible UTI, started on abx.  Admit.   The patients results and plan were reviewed and discussed.   Any x-rays performed were independently reviewed by myself.   Differential diagnosis were considered with the presenting HPI.  Medications  anastrozole (ARIMIDEX) tablet 1 mg (not administered)  colchicine tablet 0.6 mg (not administered)  predniSONE (DELTASONE) tablet 10 mg (not administered)  apixaban (ELIQUIS) tablet 5 mg (not administered)  atorvastatin (LIPITOR) tablet 20 mg (not administered)  folic acid (FOLVITE) tablet 800 mcg (not administered)  pantoprazole (PROTONIX) EC tablet 40 mg (not administered)  famotidine (PEPCID) tablet 10 mg (not administered)  sodium chloride flush (NS) 0.9 % injection 3 mL (not administered)  0.9 %  sodium chloride infusion ( Intravenous New Bag/Given 05/27/16 1150)  acetaminophen (TYLENOL) tablet 650 mg (not administered)    Or  acetaminophen (TYLENOL) suppository 650 mg (not administered)  ketorolac (TORADOL) 15 MG/ML injection 15 mg (not administered)  traZODone (DESYREL) tablet 25 mg (not administered)  ondansetron (ZOFRAN) tablet 4 mg (not administered)    Or  ondansetron (ZOFRAN) injection 4 mg (not administered)  sodium phosphate (FLEET) 7-19 GM/118ML enema 1 enema (not administered)  insulin aspart  (novoLOG) injection 0-15 Units (8 Units Subcutaneous Given 05/27/16 1354)  insulin aspart (novoLOG) injection 0-5 Units (not administered)  cefTRIAXone (ROCEPHIN) 1 g in dextrose 5 % 50 mL IVPB (1 g Intravenous New Bag/Given 05/27/16 1148)  sodium chloride 0.9 % bolus 2,000 mL (0 mLs Intravenous Stopped 05/27/16 1151)  piperacillin-tazobactam (ZOSYN) IVPB 3.375 g (0 g Intravenous Stopped 05/27/16 1104)  iopamidol (ISOVUE-300) 61 % injection (75 mLs Intravenous Contrast Given 05/27/16 1000)    Vitals:   05/27/16 1151 05/27/16 1245 05/27/16 1400 05/27/16 1515  BP: 108/70 111/65 123/71 112/75  Pulse: 66 69 69 78  Resp: _0 Temp:    97.8 F (36.6 C)  TempSrc:    Oral  SpO2: 99% 99% 99% 100%    Final diagnoses:  Chronic saddle pulmonary embolism without acute cor pulmonale (HCC)  Chronic systolic congestive heart failure (HCC)    Admission/ observation were discussed with the admitting physician, patient and/or family and they are comfortable with the plan.    Final Clinical Impressions(s) / ED Diagnoses   Final diagnoses:  Chronic saddle pulmonary embolism without acute cor pulmonale (HCC)  Chronic systolic congestive heart failure [facility])    New Prescriptions Current Discharge Medication List       Deno Etienne, DO 05/27/16 1519

## 2016-05-28 DIAGNOSIS — Z86718 Personal history of other venous thrombosis and embolism: Secondary | ICD-10-CM | POA: Diagnosis not present

## 2016-05-28 DIAGNOSIS — N3000 Acute cystitis without hematuria: Secondary | ICD-10-CM | POA: Diagnosis not present

## 2016-05-28 DIAGNOSIS — I959 Hypotension, unspecified: Secondary | ICD-10-CM | POA: Diagnosis not present

## 2016-05-28 DIAGNOSIS — I5022 Chronic systolic (congestive) heart failure: Secondary | ICD-10-CM | POA: Diagnosis present

## 2016-05-28 DIAGNOSIS — I5042 Chronic combined systolic (congestive) and diastolic (congestive) heart failure: Secondary | ICD-10-CM | POA: Diagnosis not present

## 2016-05-28 DIAGNOSIS — E1165 Type 2 diabetes mellitus with hyperglycemia: Secondary | ICD-10-CM | POA: Diagnosis not present

## 2016-05-28 DIAGNOSIS — Z9889 Other specified postprocedural states: Secondary | ICD-10-CM | POA: Diagnosis not present

## 2016-05-28 DIAGNOSIS — Z7901 Long term (current) use of anticoagulants: Secondary | ICD-10-CM | POA: Diagnosis not present

## 2016-05-28 DIAGNOSIS — N179 Acute kidney failure, unspecified: Secondary | ICD-10-CM | POA: Diagnosis not present

## 2016-05-28 DIAGNOSIS — Z9010 Acquired absence of unspecified breast and nipple: Secondary | ICD-10-CM | POA: Diagnosis not present

## 2016-05-28 DIAGNOSIS — Z170 Estrogen receptor positive status [ER+]: Secondary | ICD-10-CM | POA: Diagnosis not present

## 2016-05-28 DIAGNOSIS — N390 Urinary tract infection, site not specified: Secondary | ICD-10-CM | POA: Diagnosis not present

## 2016-05-28 DIAGNOSIS — Z8673 Personal history of transient ischemic attack (TIA), and cerebral infarction without residual deficits: Secondary | ICD-10-CM | POA: Diagnosis not present

## 2016-05-28 DIAGNOSIS — R740 Nonspecific elevation of levels of transaminase and lactic acid dehydrogenase [LDH]: Secondary | ICD-10-CM | POA: Diagnosis not present

## 2016-05-28 DIAGNOSIS — Z79811 Long term (current) use of aromatase inhibitors: Secondary | ICD-10-CM | POA: Diagnosis not present

## 2016-05-28 DIAGNOSIS — Z79899 Other long term (current) drug therapy: Secondary | ICD-10-CM | POA: Diagnosis not present

## 2016-05-28 DIAGNOSIS — I110 Hypertensive heart disease with heart failure: Secondary | ICD-10-CM | POA: Diagnosis not present

## 2016-05-28 DIAGNOSIS — B9620 Unspecified Escherichia coli [E. coli] as the cause of diseases classified elsewhere: Secondary | ICD-10-CM | POA: Diagnosis not present

## 2016-05-28 DIAGNOSIS — Z86711 Personal history of pulmonary embolism: Secondary | ICD-10-CM | POA: Diagnosis not present

## 2016-05-28 DIAGNOSIS — C50912 Malignant neoplasm of unspecified site of left female breast: Secondary | ICD-10-CM | POA: Diagnosis not present

## 2016-05-28 DIAGNOSIS — Z8249 Family history of ischemic heart disease and other diseases of the circulatory system: Secondary | ICD-10-CM | POA: Diagnosis not present

## 2016-05-28 DIAGNOSIS — Z90710 Acquired absence of both cervix and uterus: Secondary | ICD-10-CM | POA: Diagnosis not present

## 2016-05-28 DIAGNOSIS — Z7984 Long term (current) use of oral hypoglycemic drugs: Secondary | ICD-10-CM | POA: Diagnosis not present

## 2016-05-28 LAB — CBC
HCT: 31.3 % — ABNORMAL LOW (ref 36.0–46.0)
Hemoglobin: 10.1 g/dL — ABNORMAL LOW (ref 12.0–15.0)
MCH: 30.6 pg (ref 26.0–34.0)
MCHC: 32.3 g/dL (ref 30.0–36.0)
MCV: 94.8 fL (ref 78.0–100.0)
PLATELETS: 332 10*3/uL (ref 150–400)
RBC: 3.3 MIL/uL — ABNORMAL LOW (ref 3.87–5.11)
RDW: 16.5 % — AB (ref 11.5–15.5)
WBC: 9 10*3/uL (ref 4.0–10.5)

## 2016-05-28 LAB — GLUCOSE, CAPILLARY
GLUCOSE-CAPILLARY: 242 mg/dL — AB (ref 65–99)
GLUCOSE-CAPILLARY: 236 mg/dL — AB (ref 65–99)
Glucose-Capillary: 148 mg/dL — ABNORMAL HIGH (ref 65–99)
Glucose-Capillary: 149 mg/dL — ABNORMAL HIGH (ref 65–99)
Glucose-Capillary: 155 mg/dL — ABNORMAL HIGH (ref 65–99)

## 2016-05-28 LAB — BASIC METABOLIC PANEL
Anion gap: 12 (ref 5–15)
BUN: 19 mg/dL (ref 6–20)
CHLORIDE: 106 mmol/L (ref 101–111)
CO2: 23 mmol/L (ref 22–32)
CREATININE: 0.99 mg/dL (ref 0.44–1.00)
Calcium: 9.6 mg/dL (ref 8.9–10.3)
GFR calc Af Amer: >60 mL/min (ref >60)
GFR calc non Af Amer: 58 mL/min — ABNORMAL LOW (ref >60)
Glucose, Bld: 143 mg/dL — ABNORMAL HIGH (ref 65–99)
Potassium: 4.2 mmol/L (ref 3.5–5.1)
Sodium: 141 mmol/L (ref 135–145)

## 2016-05-28 MED ORDER — GLUCERNA SHAKE PO LIQD
237.0000 mL | Freq: Three times a day (TID) | ORAL | Status: DC
  Administered 2016-05-28 – 2016-05-29 (×2): 237 mL via ORAL

## 2016-05-28 MED ORDER — FUROSEMIDE 40 MG PO TABS
40.0000 mg | ORAL_TABLET | Freq: Every day | ORAL | Status: DC
  Administered 2016-05-28 – 2016-05-29 (×2): 40 mg via ORAL
  Filled 2016-05-28 (×2): qty 1

## 2016-05-28 MED ORDER — LOSARTAN POTASSIUM 50 MG PO TABS
50.0000 mg | ORAL_TABLET | Freq: Every day | ORAL | Status: DC
  Administered 2016-05-28 – 2016-05-29 (×2): 50 mg via ORAL
  Filled 2016-05-28 (×2): qty 1

## 2016-05-28 MED ORDER — CARVEDILOL 12.5 MG PO TABS
12.5000 mg | ORAL_TABLET | Freq: Two times a day (BID) | ORAL | Status: DC
  Administered 2016-05-28 – 2016-05-29 (×2): 12.5 mg via ORAL
  Filled 2016-05-28 (×2): qty 1

## 2016-05-28 NOTE — Evaluation (Signed)
Physical Therapy Evaluation Patient Details Name: Carrie Watts MRN: [id number] DOB: January 07, 1951 Today's Date: 05/28/2016   History of Present Illness  65 y.o. female with medical history significant for breast cancer, recent saddle PE and DVT on eliquis, new diabetes, CHF presented to the emergency department chief complaint of generalized weakness.  Pt admitted for UTI.   Clinical Impression  Pt admitted with above diagnosis. Pt currently with functional limitations due to the deficits listed below (see PT Problem List). On eval, pt required supervision for transfers and gait 300 feet with RW. Pt will benefit from skilled PT to increase their independence and safety with mobility to allow discharge to the venue listed below.       Follow Up Recommendations Home health PT;Supervision - Intermittent    Equipment Recommendations  None recommended by PT    Recommendations for Other Services       Precautions / Restrictions Precautions Precautions: Fall      Mobility  Bed Mobility Overal bed mobility: Modified Independent                Transfers Overall transfer level: Needs assistance Equipment used: Rolling walker (2 wheeled) Transfers: Sit to/from [facility] Sit to Stand: Supervision Stand pivot transfers: Supervision       General transfer comment: verbal cues for hand placement  Ambulation/Gait Ambulation/Gait assistance: Supervision Ambulation Distance (Feet): 300 Feet Assistive device: Rolling walker (2 wheeled) Gait Pattern/deviations: Decreased step length - right;Decreased stride length Gait velocity: decreased Gait velocity interpretation: Below normal speed for age/gender General Gait Details: supervision for safety only  Stairs            Wheelchair Mobility    Modified Rankin (Stroke Patients Only)       Balance Overall balance assessment: Needs assistance Sitting-balance support: No upper extremity supported;Feet  supported Sitting balance-Leahy Scale: Good     Standing balance support: Bilateral upper extremity supported;During functional activity Standing balance-Leahy Scale: Fair Standing balance comment: RW needed for ambulation.                             Pertinent Vitals/Pain Pain Assessment: No/denies pain    Home Living Family/patient expects to be discharged to:: Private residence Living Arrangements: Children Available Help at Discharge: Family;Available PRN/intermittently Type of Home: House Home Access: Stairs to enter Entrance Stairs-Rails: None Entrance Stairs-Number of Steps: 1 Home Layout: Two level Home Equipment: Walker - 2 wheels;Bedside commode      Prior Function Level of Independence: Independent with assistive device(s)         Comments: Daughter assists with cooking and housekeeping. RW used for ambulation.     Hand Dominance   Dominant Hand: Right    Extremity/Trunk Assessment   Upper Extremity Assessment Upper Extremity Assessment: Overall WFL for tasks assessed    Lower Extremity Assessment Lower Extremity Assessment: Generalized weakness    Cervical / Trunk Assessment Cervical / Trunk Assessment: Normal  Communication   Communication: Prefers language other than English (Pt does speak English as second language. She is from [place]. )  Cognition Arousal/Alertness: Awake/alert Behavior During Therapy: WFL for tasks assessed/performed Overall Cognitive Status: Within Functional Limits for tasks assessed                      General Comments      Exercises     Assessment/Plan    PT Assessment Patient needs continued PT services  PT  Problem List Decreased strength;Decreased activity tolerance;Decreased balance;Decreased mobility          PT Treatment Interventions DME instruction;Stair training;Functional mobility training;Therapeutic activities;Therapeutic exercise;Gait training;Balance training;Patient/family  education    PT Goals (Current goals can be found in the Care Plan section)  Acute Rehab PT Goals Patient Stated Goal: to go home with daughter. PT Goal Formulation: With patient Time For Goal Achievement: 06/11/16 Potential to Achieve Goals: Good    Frequency Min 3X/week   Barriers to discharge        Co-evaluation               End of Session Equipment Utilized During Treatment: Gait belt Activity Tolerance: Patient tolerated treatment well Patient left: in chair;with call bell/phone within reach Nurse Communication: Mobility status         Time: 1111-1145 PT Time Calculation (min) (ACUTE ONLY): 34 min   Charges:   PT Evaluation $PT Eval Moderate Complexity: 1 Procedure PT Treatments $Gait Training: 8-22 mins   PT G Codes:        Lorriane Shire 05/28/2016, 11:52 AM

## 2016-05-28 NOTE — Progress Notes (Signed)
Initial Nutrition Assessment  DOCUMENTATION CODES:   Obesity unspecified  INTERVENTION:   Glucerna Shake po TID, each supplement provides 220 kcal and 10 grams of protein  NUTRITION DIAGNOSIS:   Inadequate oral intake related to poor appetite as evidenced by per patient/family report.  GOAL:   Patient will meet greater than or equal to 90% of their needs  MONITOR:   PO intake, Supplement acceptance, Weight trends, Labs  REASON FOR ASSESSMENT:   Consult Assessment of nutrition requirement/status  ASSESSMENT:   66 y.o. female with a Past Medical History significant for breast cancer, PE, diabetes, CHF who presents with low BP.  Patient reports recent poor appetite for the past 1-2 months. She states she has lost ~50 lbs in the past few months. She is willing to try Glucerna Shakes to maximize oral intake of protein and calories. Nutrition-Focused physical exam completed. Findings are no fat depletion, no muscle depletion, and mild edema.  Labs reviewed. Medications reviewed and include folic acid and lasix.   Diet Order:  Diet heart healthy/carb modified Room service appropriate? Yes; Fluid consistency: Thin  Skin:  Reviewed, no issues  Last BM:  2/16  Height:   Ht Readings from Last 1 Encounters:  05/27/16 5\' 2"  (1.575 m)    Weight:   Wt Readings from Last 1 Encounters:  05/28/16 184 lb 8 oz (83.7 kg)    Ideal Body Weight:  50 kg  BMI:  Body mass index is 33.75 kg/m.  Estimated Nutritional Needs:   Kcal:  1600-1800  Protein:  85-100 gm  Fluid:  1.8-2 L  EDUCATION NEEDS:   No education needs identified at this time  Molli Barrows, [place], [place], [place] Pager [phone] After Hours Pager [phone]

## 2016-05-28 NOTE — Progress Notes (Addendum)
PROGRESS NOTE    Carrie Watts  [id number] DOB: 1951-03-14 DOA: 05/27/2016 PCP: Dorena Dew, FNP     Brief Narrative:  Carrie Watts is a very pleasant 66 y.o. female with medical history significant for breast cancer, recent saddle PE and DVT on eliquis, new diabetes, CHF presents to the emergency department chief complaint of generalized weakness. Initial evaluation reveals urinalysis consistent with UTI. She was recently hospitalized for CHF and had been recovering at home. She states "the first day when okay" and then she started feeling "weak". She also had decreased oral intake due to lack of appetite and dysuria.   Assessment & Plan:   Principal Problem:   UTI (urinary tract infection) Active Problems:   Diabetes mellitus without complication (HCC)   Malignant neoplasm of overlapping sites of left breast in female, estrogen receptor positive (HCC)   Generalized weakness   Elevated lactic acid level   Acute kidney injury ([place])   Pulmonary embolism (HCC)   Abdominal pain  Escherichia coli Urinary tract infection, POA -Sensitivity of urine culture is pending. -Blood cultures are pending -Blood pressure has improved with IV fluids -Lactic acid has improved with IV fluids -Continue Rocephin  Acute kidney injury -Secondary to poor oral intake as well as UTI. -Resolved with IV fluids -Monitor BMP  Diabetes, well controlled -Hemoglobin A1c 6.5  -Hold metformin for now -SSI   History of PE/DVT -Continue Eliquis   Breast cancer. Status post mastectomy -Followed by Dr. Griffith Citron -Continue anastrozole   Generalized weakness -Multifactorial, deconditioning  -PT to eval   Chronic systolic and diastolic heart failure -Recent echo with mild LVH EF of [id number] grade 1 diastolic dysfunction. Recent hospitalization she was evaluated by cardiology and went to cardiac catheterization revealing no significant obstruction. At that time she was placed on Coreg Lipitor and  losartan. It was noted her CHF may be related doxorubicin she received 2 months ago for her breast cancer -Not acutely decompensated. Restart home meds as BP improved and AKI resolved    DVT prophylaxis: Eliquis Code Status: Full Family Communication: spoke with daughter over the phone  Disposition Plan: pending further improvement and stabilization   Consultants:   None  Procedures:   None  Antimicrobials:  Anti-infectives    Start     Dose/Rate Route Frequency Ordered Stop   05/27/16 1130  cefTRIAXone (ROCEPHIN) 1 g in dextrose 5 % 50 mL IVPB     1 g 100 mL/hr over 30 Minutes Intravenous Every 24 hours 05/27/16 1104     05/27/16 0930  piperacillin-tazobactam (ZOSYN) IVPB 3.375 g     3.375 g 100 mL/hr over 30 Minutes Intravenous  Once 05/27/16 0921 05/27/16 1104        Subjective: She states that she feels much better since admission. She admits to dysuria prior to admission. In the past admission, patient states that she did not have a Foley catheter. She denies any fevers or chills. No chest pain or shortness of breath currently.  Objective: Vitals:   05/27/16 1515 05/27/16 2235 05/28/16 0501 05/28/16 0507  BP: 112/75 114/65  113/74  Pulse: 78 80  83  Resp:  18  18  Temp: 97.8 F (36.6 C) 98.2 F (36.8 C)  98.2 F (36.8 C)  TempSrc: Oral Oral  Oral  SpO2: 100% 100%  98%  Weight: 84.5 kg (186 lb 3.2 oz)  83.7 kg (184 lb 8 oz)   Height: 5\' 2"  (1.575 m)       Intake/Output Summary (Last  24 hours) at 05/28/16 1022 Last data filed at 05/28/16 0956  Gross per 24 hour  Intake             2655 ml  Output              400 ml  Net             2255 ml   Filed Weights   05/27/16 1515 05/28/16 0501  Weight: 84.5 kg (186 lb 3.2 oz) 83.7 kg (184 lb 8 oz)    Examination:  General exam: Appears calm and comfortable  Respiratory system: Clear to auscultation. Respiratory effort normal. Cardiovascular system: S1 & S2 heard, RRR. No JVD, murmurs, rubs, gallops or  clicks. No pedal edema. Gastrointestinal system: Abdomen is nondistended, soft and nontender. No organomegaly or masses felt. Normal bowel sounds heard. Central nervous system: Alert and oriented. No focal neurological deficits. Extremities: Symmetric 5 x 5 power. Skin: No rashes, lesions or ulcers Psychiatry: Judgement and insight appear normal. Mood & affect appropriate.   Data Reviewed: I have personally reviewed following labs and imaging studies  CBC:  Recent Labs Lab 05/27/16 0830 05/27/16 0904 05/28/16 0653  WBC 13.5*  --  9.0  NEUTROABS 12.3*  --   --   HGB 10.5* 11.6* 10.1*  HCT 32.8* 34.0* 31.3*  MCV 95.1  --  94.8  PLT 360  --  [id number]   Basic Metabolic Panel:  Recent Labs Lab 05/22/16 0624 05/27/16 0830 05/27/16 0904 05/28/16 0653  NA 139 134* 133* 141  K 3.9 5.2* 5.0 4.2  CL 105 100* 100* 106  CO2 23 21*  --  23  GLUCOSE 142* 262* 265* 143*  BUN 18 30* 34* 19  CREATININE 0.80 1.60* 1.50* 0.99  CALCIUM 9.6 9.8  --  9.6  MG 1.7  --   --   --    GFR: Estimated Creatinine Clearance: 55.3 mL/min (by C-G formula based on SCr of 0.99 mg/dL). Liver Function Tests:  Recent Labs Lab 05/27/16 0830  AST 27  ALT 27  ALKPHOS 64  BILITOT 0.8  PROT 7.2  ALBUMIN 3.1*   No results for input(s): LIPASE, AMYLASE in the last 168 hours. No results for input(s): AMMONIA in the last 168 hours. Coagulation Profile: No results for input(s): INR, PROTIME in the last 168 hours. Cardiac Enzymes: No results for input(s): CKTOTAL, CKMB, CKMBINDEX, TROPONINI in the last 168 hours. BNP (last 3 results) No results for input(s): PROBNP in the last 8760 hours. HbA1C: No results for input(s): HGBA1C in the last 72 hours. CBG:  Recent Labs Lab 05/27/16 1147 05/27/16 1347 05/27/16 1714 05/28/16 0038 05/28/16 0743  GLUCAP 238* 253* 194* 148* 149*   Lipid Profile: No results for input(s): CHOL, HDL, LDLCALC, TRIG, CHOLHDL, LDLDIRECT in the last 72 hours. Thyroid Function  Tests:  Recent Labs  05/27/16 1606  TSH 0.564   Anemia Panel: No results for input(s): VITAMINB12, FOLATE, FERRITIN, TIBC, IRON, RETICCTPCT in the last 72 hours. Sepsis Labs:  Recent Labs Lab 05/27/16 0910 05/27/16 1254  LATICACIDVEN 3.08* 2.69*    Recent Results (from the past 240 hour(s))  Urine culture     Status: Abnormal (Preliminary result)   Collection Time: 05/27/16  8:53 AM  Result Value Ref Range Status   Specimen Description URINE, CATHETERIZED  Final   Special Requests NONE  Final   Culture 80,000 COLONIES/mL GRAM NEGATIVE RODS (A)  Final   Report Status PENDING  Incomplete  Radiology Studies: Ct Abdomen Pelvis W Contrast  Result Date: 05/27/2016 CLINICAL DATA:  Right lower quadrant pain. History of breast cancer. Hypotension. EXAM: CT ABDOMEN AND PELVIS WITH CONTRAST TECHNIQUE: Multidetector CT imaging of the abdomen and pelvis was performed using the standard protocol following bolus administration of intravenous contrast. CONTRAST:  38mL ISOVUE-300 IOPAMIDOL (ISOVUE-300) INJECTION 61% COMPARISON:  05/05/2016 FINDINGS: Lower chest: Airspace disease noted in the left lower lobe, increased since prior study. Right basilar atelectasis or scarring. No effusions. Heart is normal size. Hepatobiliary: No focal hepatic abnormality. Gallbladder unremarkable. Pancreas: No focal abnormality or ductal dilatation. Spleen: No focal abnormality.  Normal size. Adrenals/Urinary Tract: No adrenal abnormality. No focal renal abnormality. No stones or hydronephrosis. Urinary bladder is unremarkable. Stomach/Bowel: Appendix is normal. Sigmoid diverticulosis. No active diverticulitis. Stomach and small bowel unremarkable. Vascular/Lymphatic: No evidence of aneurysm or adenopathy. Reproductive: Prior hysterectomy.  No adnexal masses. Other: No free fluid or free air. Small bilateral inguinal hernias containing fat. Musculoskeletal: No acute bony abnormality. IMPRESSION: No acute findings  in the abdomen or pelvis. Sigmoid diverticulosis. Normal appendix. Increasing atelectasis or infiltrate at the left lung base. Electronically Signed   By: Rolm Baptise M.D.   On: 05/27/2016 10:28   Dg Chest Port 1 View  Result Date: 05/27/2016 CLINICAL DATA:  Acute presentation with sepsis.  Weakness. EXAM: PORTABLE CHEST 1 VIEW COMPARISON:  05/11/2016 FINDINGS: Heart size is normal by with left ventricular prominence. There is aortic atherosclerosis. Right lung is clear. Patchy density previously seen in the retrocardiac left lower lobe appears improved today but not completely clear, probably. No worsening or new finding. IMPRESSION: Improved aeration at the left base, by this single view. Electronically Signed   By: Nelson Chimes M.D.   On: 05/27/2016 08:43      Scheduled Meds: . anastrozole  1 mg Oral Daily  . apixaban  5 mg Oral BID  . atorvastatin  20 mg Oral QHS  . cefTRIAXone (ROCEPHIN)  IV  1 g Intravenous Q24H  . famotidine  10 mg Oral Daily  . folic acid  1 mg Oral Daily  . insulin aspart  0-15 Units Subcutaneous TID WC  . insulin aspart  0-5 Units Subcutaneous QHS  . pantoprazole  40 mg Oral Daily  . predniSONE  10 mg Oral Q breakfast  . sodium chloride flush  3 mL Intravenous Q12H   Continuous Infusions:   LOS: 0 days    Time spent: 40 minutes   Dessa Phi, DO [facility] [email or web address] Password [facility] 05/28/2016, 10:22 AM

## 2016-05-29 DIAGNOSIS — N390 Urinary tract infection, site not specified: Secondary | ICD-10-CM | POA: Diagnosis not present

## 2016-05-29 DIAGNOSIS — N179 Acute kidney failure, unspecified: Secondary | ICD-10-CM | POA: Diagnosis not present

## 2016-05-29 DIAGNOSIS — N3000 Acute cystitis without hematuria: Secondary | ICD-10-CM | POA: Diagnosis not present

## 2016-05-29 DIAGNOSIS — I5022 Chronic systolic (congestive) heart failure: Secondary | ICD-10-CM | POA: Diagnosis not present

## 2016-05-29 DIAGNOSIS — I110 Hypertensive heart disease with heart failure: Secondary | ICD-10-CM | POA: Diagnosis not present

## 2016-05-29 DIAGNOSIS — I959 Hypotension, unspecified: Secondary | ICD-10-CM | POA: Diagnosis not present

## 2016-05-29 DIAGNOSIS — E1165 Type 2 diabetes mellitus with hyperglycemia: Secondary | ICD-10-CM | POA: Diagnosis not present

## 2016-05-29 DIAGNOSIS — C50912 Malignant neoplasm of unspecified site of left female breast: Secondary | ICD-10-CM | POA: Diagnosis not present

## 2016-05-29 LAB — CBC WITH DIFFERENTIAL/PLATELET
Basophils Absolute: 0 10*3/uL (ref 0.0–0.1)
Basophils Relative: 0 %
EOS ABS: 0.1 10*3/uL (ref 0.0–0.7)
EOS PCT: 2 %
HCT: 29.1 % — ABNORMAL LOW (ref 36.0–46.0)
Hemoglobin: 9.3 g/dL — ABNORMAL LOW (ref 12.0–15.0)
LYMPHS ABS: 2 10*3/uL (ref 0.7–4.0)
LYMPHS PCT: 28 %
MCH: 30 pg (ref 26.0–34.0)
MCHC: 32 g/dL (ref 30.0–36.0)
MCV: 93.9 fL (ref 78.0–100.0)
Monocytes Absolute: 0.3 10*3/uL (ref 0.1–1.0)
Monocytes Relative: 5 %
Neutro Abs: 4.7 10*3/uL (ref 1.7–7.7)
Neutrophils Relative %: 65 %
PLATELETS: 300 10*3/uL (ref 150–400)
RBC: 3.1 MIL/uL — ABNORMAL LOW (ref 3.87–5.11)
RDW: 16.4 % — AB (ref 11.5–15.5)
WBC: 7.2 10*3/uL (ref 4.0–10.5)

## 2016-05-29 LAB — BASIC METABOLIC PANEL
Anion gap: 12 (ref 5–15)
BUN: 19 mg/dL (ref 6–20)
CALCIUM: 9.4 mg/dL (ref 8.9–10.3)
CO2: 23 mmol/L (ref 22–32)
CREATININE: 0.93 mg/dL (ref 0.44–1.00)
Chloride: 99 mmol/L — ABNORMAL LOW (ref 101–111)
GFR calc Af Amer: >60 mL/min (ref >60)
GLUCOSE: 181 mg/dL — AB (ref 65–99)
POTASSIUM: 3.5 mmol/L (ref 3.5–5.1)
SODIUM: 134 mmol/L — AB (ref 135–145)

## 2016-05-29 LAB — URINE CULTURE

## 2016-05-29 LAB — GLUCOSE, CAPILLARY
GLUCOSE-CAPILLARY: 167 mg/dL — AB (ref 65–99)
GLUCOSE-CAPILLARY: 274 mg/dL — AB (ref 65–99)

## 2016-05-29 MED ORDER — CEPHALEXIN 500 MG PO CAPS: 500.0000 mg | ORAL_CAPSULE | Freq: Two times a day (BID) | ORAL | 0 refills | Status: DC

## 2016-05-29 MED ORDER — CEPHALEXIN 500 MG PO CAPS
500.0000 mg | ORAL_CAPSULE | Freq: Two times a day (BID) | ORAL | Status: DC
  Administered 2016-05-29: 500 mg via ORAL
  Filled 2016-05-29: qty 1

## 2016-05-29 MED ORDER — CEPHALEXIN 500 MG PO CAPS: 500.0000 mg | ORAL_CAPSULE | Freq: Two times a day (BID) | ORAL | 0 refills | Status: AC

## 2016-05-29 MED ORDER — GLUCERNA SHAKE PO LIQD: 237.0000 mL | Freq: Three times a day (TID) | ORAL | 0 refills | Status: DC

## 2016-05-29 NOTE — Care Management Note (Signed)
Case Management Note Marvetta Gibbons RN, BSN Unit 2W-Case Manager [phone]  Patient Details  Name: Carrie Watts MRN: [id number] Date of Birth: 10-31-1950  Subjective/Objective:     Pt admitted with UTI               Action/Plan: PTA pt lived at home with daughter- noted order for HHPT on discharge- spoke with pt and daughter at bedside- pt does not have insurance and does not qualify for HHPT services under charity- daughter is not interested in paying out of pocket- pt does have RW at home. Per daughter they have applied for Medicaid and hope to hear soon if pt has Medicaid- discussed that pt still would not qualify for HHPT under medicaid- info given to daughter about services under Medicaid and application for PCS provided. Pt is seen at [facility]- was started on Eliquis recently - has used 30 day free card but has not applied for pt assistance- application provided and encouraged to f/u ASAP with clinic regarding application. Pt will be d/c'd on abx- was assisted with [place] letter on 2/11- letter still active- and can be used for prescriptions - daughter does not have letter with her- provided another [facility] letter to daughter for medication assistance on discharge for abx.   Expected Discharge Date:  05/29/16               Expected Discharge Plan:  [place]  In-House Referral:     Discharge planning Services  CM Consult, Medication Assistance, [facility] Program  Post Acute Care Choice:  Home Health Choice offered to:  Adult Children, Patient  DME Arranged:    DME Agency:     HH Arranged:  NA HH Agency:     Status of Service:  Completed, signed off  If discussed at [place] of Stay Meetings, dates discussed:    Additional Comments:  Dawayne Patricia, RN 05/29/2016, 1:25 PM

## 2016-05-29 NOTE — Discharge Summary (Signed)
Physician Discharge Summary  Carrie Watts JHE:[id number] DOB: 12-07-1950 DOA: 05/27/2016  PCP: Carrie Dew, FNP  Admit date: 05/27/2016 Discharge date: 05/29/2016  Admitted From: Home Disposition:  Home  Recommendations for Outpatient Follow-up:  1. Follow up with PCP in 1 week 2. Please obtain BMP/CBC in 1 week  3. Please follow up on the following pending results: Final blood culture results  Home Health: PT  Equipment/Devices: None   Discharge Condition: Stable CODE STATUS: Full  Diet recommendation: Heart healthy/carb modified + Glucerna Shake po TID   Brief/Interim Summary: From H&P: Carrie Watts a very pleasant 66 y.o.femalewith medical history significant for breast cancer, recent saddle PE and DVT on eliquis, new diabetes, CHF presents to the emergency department chief complaint of generalized weakness. Initial evaluation reveals urinalysis consistent with UTI. She was recently hospitalized for CHF and had been recovering at home. She states "the first day when okay" and then she started feeling "weak". She also had decreased oral intake due to lack of appetite and dysuria.   Interim: Patient was admitted with suspected UTI. Urine culture resulted in pansensitive Escherichia coli. Patient was initially treated with Rocephin, which was transitioned to oral Keflex. Blood cultures are negative at the time of discharge, but final culture is pending. Patient's dysuria continued to improve while in hospital. She worked with physical therapy as well who recommended home health physical therapy. Dietitian also evaluated patient.   Subjective on day of discharge: Feeling better since admission. She denies any dysuria this morning, has very mild suprapubic tenderness. She continues to complain of poor appetite and poor oral intake. I did recommend her to take Glucerna shake as recommended by dietitian.  Discharge Diagnoses:  Principal Problem:   UTI (urinary tract  infection) Active Problems:   Diabetes mellitus without complication (HCC)   Malignant neoplasm of overlapping sites of left breast in female, estrogen receptor positive (HCC)   Generalized weakness   Elevated lactic acid level   Acute kidney injury ([place])   Pulmonary embolism (HCC)   Abdominal pain  Escherichia coli Urinary tract infection, POA -Pan-sensitive, transition rocephin to keflex at time of discharge  -Blood cultures NGTD, final culture pending  -Blood pressure has improved with IV fluids -Lactic acid has improved with IV fluids  Acute kidney injury -Secondary to poor oral intake as well as UTI  -Resolved with IV fluids  Diabetes, well controlled -Hemoglobin A1c 6.5  -Hold metformin for now, can restart at time of discharge  -SSI   History of PE/DVT -Continue Eliquis   Breast cancer. Status post mastectomy -Followed by Dr. Griffith Watts -Continue anastrozole   Generalized weakness -Multifactorial, deconditioning  -PT recommends home health PT   Poor oral intake -Recommendation noted by dietician -Glucerna shakes PO TID between meals   Chronic systolic and diastolic heart failure -Recent echo with mild LVH EF of 18% grade 1 diastolic dysfunction. Recent hospitalization she was evaluated by cardiology and went to cardiac catheterization revealing no significant obstruction. At that time she was placed on Coreg Lipitor and losartan. It was noted her CHF may be related doxorubicin she received 2 months ago for her breast cancer -Not acutely decompensated. Restart home meds as BP improved and AKI resolved    Discharge Instructions  Discharge Instructions    Diet - low sodium heart healthy    Complete by:  As directed    Diet Carb Modified    Complete by:  As directed    Increase activity slowly    Complete by:  As directed      Allergies as of 05/29/2016      Reactions   Dust Mite Extract       Medication List    TAKE these medications    anastrozole 1 MG tablet Commonly known as:  ARIMIDEX Take 1 tablet (1 mg total) by mouth daily.   apixaban 5 MG Tabs tablet Commonly known as:  ELIQUIS Take 1 tablet (5 mg total) by mouth 2 (two) times daily.   atorvastatin 20 MG tablet Commonly known as:  LIPITOR Take 1 tablet (20 mg total) by mouth at bedtime. Can switch to a cheaper statin if needed   carvedilol 12.5 MG tablet Commonly known as:  COREG Take 1 tablet (12.5 mg total) by mouth 2 (two) times daily with a meal.   cephALEXin 500 MG capsule Commonly known as:  KEFLEX Take 1 capsule (500 mg total) by mouth every 12 (twelve) hours.   colchicine 0.6 MG tablet Take 1 tablet (0.6 mg total) by mouth daily.   feeding supplement (GLUCERNA SHAKE) Liqd Take 237 mLs by mouth 3 (three) times daily between meals.   folic acid 967 MCG tablet Commonly known as:  FOLVITE Take 800 mcg by mouth every morning.   furosemide 40 MG tablet Commonly known as:  LASIX Take 1 tablet (40 mg total) by mouth daily.   glipiZIDE 5 MG 24 hr tablet Commonly known as:  GLUCOTROL XL Take 5 mg by mouth daily as needed. For blood sugar >250   glucose monitoring kit monitoring kit 1 each by Does not apply route 4 (four) times daily - after meals and at bedtime. 1 month Diabetic Testing Supplies for QAC-QHS accuchecks.  Diagnosis E11.65. Any brand OK   losartan 50 MG tablet Commonly known as:  COZAAR Take 1 tablet (50 mg total) by mouth daily.   metFORMIN 1000 MG tablet Commonly known as:  GLUCOPHAGE Take 1 tablet (1,000 mg total) by mouth 2 (two) times daily with a meal.   multivitamin with minerals Tabs tablet Take 1 tablet by mouth every morning.   omeprazole 20 MG capsule Commonly known as:  PRILOSEC Take 20 mg by mouth every morning.   ondansetron 4 MG tablet Commonly known as:  ZOFRAN Take 1 tablet (4 mg total) by mouth every 8 (eight) hours as needed for nausea or vomiting.   potassium chloride 10 MEQ tablet Commonly known  as:  K-DUR,KLOR-CON Take 1 tablet (10 mEq total) by mouth daily.   predniSONE 10 MG tablet Commonly known as:  DELTASONE Take 1 tablet (10 mg total) by mouth daily with breakfast. 2 tablets daily x 4 days then 1 daily x 3 days   ranitidine 300 MG tablet Commonly known as:  ZANTAC Take 300 mg by mouth at bedtime.       Allergies  Allergen Reactions  . Dust Mite Extract     Consultations:  None   Procedures/Studies: Ct Abdomen Pelvis Wo Contrast  Result Date: 05/05/2016 CLINICAL DATA:  Anemia. Gastrointestinal bleeding. Recent pulmonary embolus. EXAM: CT ABDOMEN AND PELVIS WITHOUT CONTRAST TECHNIQUE: Multidetector CT imaging of the abdomen and pelvis was performed following the standard protocol without IV contrast. COMPARISON:  None. FINDINGS: Lower chest: Mild dependent atelectasis at both lung bases. No pleural or pericardial fluid. Hepatobiliary: No focal or acute finding. No calcified gallstones. Question slight lobularity of the liver surface raising the possibility of early cirrhosis. Pancreas: Normal Spleen: Normal Adrenals/Urinary Tract: Adrenal glands are normal. Kidneys are normal. No cyst, mass,  stone or hydronephrosis. Bladder is normal. Stomach/Bowel: No acute bowel finding. Normal appearing appendix. Diverticulosis of the descending and sigmoid colon without definite diverticulitis. Low level diverticulitis can be inapparent at imaging. Vascular/Lymphatic: Normal Reproductive: Previous hysterectomy. Other: No free fluid or air Musculoskeletal: Negative IMPRESSION: No acute finding by CT. No lesions seen to explain gastrointestinal bleeding. The patient does have diverticulosis which can be associated with bleeding. No convincing diverticulitis. Low level diverticulitis can be inapparent at imaging. Question early cirrhosis. Electronically Signed   By: Nelson Chimes M.D.   On: 05/05/2016 18:00   Dg Chest 2 View  Result Date: 05/11/2016 CLINICAL DATA:  Left-sided chest pain  extending to the mandible and shoulder. Cough. Elevated troponin. EXAM: CHEST  2 VIEW COMPARISON:  05/11/2016 at 14:11 FINDINGS: Persistent left base opacity is unchanged and could represent pneumonia or evolving pulmonary infarction. Right lung is clear. Pulmonary vasculature is normal. Hilar and mediastinal contours are unremarkable and unchanged. Mild cardiomegaly, unchanged. IMPRESSION: Left base opacity, with considerations including pneumonia, pulmonary infarction, aspiration. Normal pulmonary vasculature. Electronically Signed   By: Andreas Newport M.D.   On: 05/11/2016 19:49   Dg Chest 2 View  Result Date: 05/11/2016 CLINICAL DATA:  Acute onset of shortness of breath and mid chest pain. Initial encounter. EXAM: CHEST  2 VIEW COMPARISON:  Chest radiograph and CTA of the chest performed 04/24/2016 FINDINGS: The lungs are well-aerated. Mild left basilar airspace opacity raises question for pneumonia. There is no evidence of pleural effusion or pneumothorax. The heart is borderline normal in size. No acute osseous abnormalities are seen. IMPRESSION: Mild left basilar airspace opacity raises question for pneumonia. Residual pulmonary infarct may have a similar appearance, given recent saddle pulmonary embolus. Electronically Signed   By: Garald Balding M.D.   On: 05/11/2016 16:19   Dg Ankle 2 Views Left  Result Date: 05/19/2016 CLINICAL DATA:  Pain for 4 days, worse with bearing weight. No injury. EXAM: LEFT ANKLE - 2 VIEW COMPARISON:  None. FINDINGS: No fracture deformity nor dislocation. The ankle mortise appears congruent and the tibiofibular syndesmosis intact. Mild midfoot osteoarthrosis. No destructive bony lesions. Diffuse soft tissue swelling without subcutaneous gas or radiopaque foreign bodies. Severe vascular calcifications. IMPRESSION: Soft tissue swelling without acute osseous process. Severe vascular calcifications. Electronically Signed   By: Elon Alas M.D.   On: 05/19/2016 14:10    Ct Angio Chest Pe W And/or Wo Contrast  Result Date: 05/11/2016 CLINICAL DATA:  Cough, shortness of breath. Recent saddle pulmonary embolus. Now with LEFT pulmonary infiltrate, assess for infarct. History of breast cancer, diabetes, CHF. EXAM: CT ANGIOGRAPHY CHEST WITH CONTRAST TECHNIQUE: Multidetector CT imaging of the chest was performed using the standard protocol during bolus administration of intravenous contrast. Multiplanar CT image reconstructions and MIPs were obtained to evaluate the vascular anatomy. CONTRAST:  80 cc Isovue 370. COMPARISON:  CT chest April 24, 2016. FINDINGS: CARDIOVASCULAR: Adequate contrast opacification of the pulmonary artery's. Main pulmonary artery is not enlarged. Residual pulmonary emboli LEFT lobar pulmonary artery's, predominately central in distribution and occlusive an LEFT lower lobe. Dissolution of the saddle embolic component. Residual nonocclusive embolus RIGHT middle lobar artery. RIGHT lower lobe pulmonary artery filling defect is now peripheral compatible with early chronicity, propagating to the segmental artery's without occlusion. Heart size is mildly enlarged, RV/ LV equals 0.9, previously 1.2. No pericardial effusions. Thoracic aorta is normal course and caliber, trace calcific atherosclerosis. MEDIASTINUM/NODES: No lymphadenopathy by CT size criteria. LUNGS/PLEURA: Tracheobronchial tree is patent, no pneumothorax. Bilateral  lower lobe and lingular enhancing atelectasis without wedge-like consolidation to suggest infarct. No pleural effusion. UPPER ABDOMEN: Included view of the abdomen is unremarkable. MUSCULOSKELETAL: Visualized soft tissues and included osseous structures are nonacute. Broad dextroscoliosis. Mild old T3 and T8 for compression fractures. Review of the MIP images confirms the above findings. IMPRESSION: No new pulmonary emboli. Dissolving, decreased clot burden from prior CT. No pulmonary infarcts. Bibasilar atelectasis. Mild cardiomegaly.  Borderline, improved RIGHT heart strain (RV/LV 0.9, previously 1.2). Electronically Signed   By: Elon Alas M.D.   On: 05/11/2016 23:07   Ct Abdomen Pelvis W Contrast  Result Date: 05/27/2016 CLINICAL DATA:  Right lower quadrant pain. History of breast cancer. Hypotension. EXAM: CT ABDOMEN AND PELVIS WITH CONTRAST TECHNIQUE: Multidetector CT imaging of the abdomen and pelvis was performed using the standard protocol following bolus administration of intravenous contrast. CONTRAST:  33m ISOVUE-300 IOPAMIDOL (ISOVUE-300) INJECTION 61% COMPARISON:  05/05/2016 FINDINGS: Lower chest: Airspace disease noted in the left lower lobe, increased since prior study. Right basilar atelectasis or scarring. No effusions. Heart is normal size. Hepatobiliary: No focal hepatic abnormality. Gallbladder unremarkable. Pancreas: No focal abnormality or ductal dilatation. Spleen: No focal abnormality.  Normal size. Adrenals/Urinary Tract: No adrenal abnormality. No focal renal abnormality. No stones or hydronephrosis. Urinary bladder is unremarkable. Stomach/Bowel: Appendix is normal. Sigmoid diverticulosis. No active diverticulitis. Stomach and small bowel unremarkable. Vascular/Lymphatic: No evidence of aneurysm or adenopathy. Reproductive: Prior hysterectomy.  No adnexal masses. Other: No free fluid or free air. Small bilateral inguinal hernias containing fat. Musculoskeletal: No acute bony abnormality. IMPRESSION: No acute findings in the abdomen or pelvis. Sigmoid diverticulosis. Normal appendix. Increasing atelectasis or infiltrate at the left lung base. Electronically Signed   By: KRolm BaptiseM.D.   On: 05/27/2016 10:28   Dg Chest Port 1 View  Result Date: 05/27/2016 CLINICAL DATA:  Acute presentation with sepsis.  Weakness. EXAM: PORTABLE CHEST 1 VIEW COMPARISON:  05/11/2016 FINDINGS: Heart size is normal by with left ventricular prominence. There is aortic atherosclerosis. Right lung is clear. Patchy density  previously seen in the retrocardiac left lower lobe appears improved today but not completely clear, probably. No worsening or new finding. IMPRESSION: Improved aeration at the left base, by this single view. Electronically Signed   By: MNelson ChimesM.D.   On: 05/27/2016 08:43       Discharge Exam: Vitals:   05/28/16 2130 05/29/16 0520  BP: 98/60 (!) 118/56  Pulse: 78 79  Resp: 18 18  Temp: 98.6 F (37 C) 98.1 F (36.7 C)   Vitals:   05/28/16 1612 05/28/16 2130 05/29/16 0500 05/29/16 0520  BP: 99/66 98/60  (!) 118/56  Pulse: 99 78  79  Resp: _0 Temp: 98.1 F (36.7 C) 98.6 F (37 C)  98.1 F (36.7 C)  TempSrc: Oral Oral  Oral  SpO2: 100% 100%  100%  Weight:   86.8 kg (191 lb 4.8 oz)   Height:        General: Pt is alert, awake, not in acute distress Cardiovascular: RRR, S1/S2 +, no rubs, no gallops Respiratory: CTA bilaterally, no wheezing, no rhonchi Abdominal: Soft, NT, ND, bowel sounds + Extremities: no edema, no cyanosis Neuro: Nonfocal, speech is clear and fluent     The results of significant diagnostics from this hospitalization (including imaging, microbiology, ancillary and laboratory) are listed below for reference.     Microbiology: Recent Results (from the past 240 hour(s))  Blood Culture (routine x 2)  Status: None (Preliminary result)   Collection Time: 05/27/16  8:24 AM  Result Value Ref Range Status   Specimen Description BLOOD RIGHT FOREARM  Final   Special Requests BOTTLES DRAWN AEROBIC ONLY  5CC  Final   Culture NO GROWTH 1 DAY  Final   Report Status PENDING  Incomplete  Blood Culture (routine x 2)     Status: None (Preliminary result)   Collection Time: 05/27/16  8:29 AM  Result Value Ref Range Status   Specimen Description BLOOD RIGHT HAND  Final   Special Requests IN PEDIATRIC BOTTLE  2CC  Final   Culture NO GROWTH 1 DAY  Final   Report Status PENDING  Incomplete  Urine culture     Status: Abnormal   Collection Time: 05/27/16   8:53 AM  Result Value Ref Range Status   Specimen Description URINE, CATHETERIZED  Final   Special Requests NONE  Final   Culture 80,000 COLONIES/mL ESCHERICHIA COLI (A)  Final   Report Status 05/29/2016 FINAL  Final   Organism ID, Bacteria ESCHERICHIA COLI (A)  Final      Susceptibility   Escherichia coli - MIC*    AMPICILLIN 4 SENSITIVE Sensitive     CEFAZOLIN <=4 SENSITIVE Sensitive     CEFTRIAXONE <=1 SENSITIVE Sensitive     CIPROFLOXACIN <=0.25 SENSITIVE Sensitive     GENTAMICIN <=1 SENSITIVE Sensitive     IMIPENEM <=0.25 SENSITIVE Sensitive     NITROFURANTOIN <=16 SENSITIVE Sensitive     TRIMETH/SULFA <=20 SENSITIVE Sensitive     AMPICILLIN/SULBACTAM <=2 SENSITIVE Sensitive     PIP/TAZO <=4 SENSITIVE Sensitive     Extended ESBL NEGATIVE Sensitive     * 80,000 COLONIES/mL ESCHERICHIA COLI     Labs: BNP (last 3 results) No results for input(s): BNP in the last 8760 hours. Basic Metabolic Panel:  Recent Labs Lab 05/27/16 0830 05/27/16 0904 05/28/16 0653 05/29/16 0529  NA 134* 133* 141 134*  K 5.2* 5.0 4.2 3.5  CL 100* 100* 106 99*  CO2 21*  --  23 23  GLUCOSE 262* 265* 143* 181*  BUN 30* 34* 19 19  CREATININE 1.60* 1.50* 0.99 0.93  CALCIUM 9.8  --  9.6 9.4   Liver Function Tests:  Recent Labs Lab 05/27/16 0830  AST 27  ALT 27  ALKPHOS 64  BILITOT 0.8  PROT 7.2  ALBUMIN 3.1*   No results for input(s): LIPASE, AMYLASE in the last 168 hours. No results for input(s): AMMONIA in the last 168 hours. CBC:  Recent Labs Lab 05/27/16 0830 05/27/16 0904 05/28/16 0653 05/29/16 0529  WBC 13.5*  --  9.0 7.2  NEUTROABS 12.3*  --   --  4.7  HGB 10.5* 11.6* 10.1* 9.3*  HCT 32.8* 34.0* 31.3* 29.1*  MCV 95.1  --  94.8 93.9  PLT 360  --  332 300   Cardiac Enzymes: No results for input(s): CKTOTAL, CKMB, CKMBINDEX, TROPONINI in the last 168 hours. BNP: Invalid input(s): POCBNP CBG:  Recent Labs Lab 05/28/16 0743 05/28/16 1218 05/28/16 1725  05/28/16 2207 05/29/16 0838  GLUCAP 149* 242* 236* 155* 167*   D-Dimer No results for input(s): DDIMER in the last 72 hours. Hgb A1c No results for input(s): HGBA1C in the last 72 hours. Lipid Profile No results for input(s): CHOL, HDL, LDLCALC, TRIG, CHOLHDL, LDLDIRECT in the last 72 hours. Thyroid function studies  Recent Labs  05/27/16 1606  TSH 0.564   Anemia work up No results for input(s): VITAMINB12, FOLATE,  FERRITIN, TIBC, IRON, RETICCTPCT in the last 72 hours. Urinalysis    Component Value Date/Time   COLORURINE YELLOW 05/27/2016 0853   APPEARANCEUR CLEAR 05/27/2016 0853   LABSPEC 1.008 05/27/2016 0853   PHURINE 5.0 05/27/2016 0853   GLUCOSEU NEGATIVE 05/27/2016 0853   HGBUR MODERATE (A) 05/27/2016 0853   BILIRUBINUR NEGATIVE 05/27/2016 0853   KETONESUR NEGATIVE 05/27/2016 0853   PROTEINUR NEGATIVE 05/27/2016 0853   UROBILINOGEN 0.2 05/23/2016 1348   NITRITE NEGATIVE 05/27/2016 0853   LEUKOCYTESUR SMALL (A) 05/27/2016 0853   Sepsis Labs Invalid input(s): PROCALCITONIN,  WBC,  LACTICIDVEN Microbiology Recent Results (from the past 240 hour(s))  Blood Culture (routine x 2)     Status: None (Preliminary result)   Collection Time: 05/27/16  8:24 AM  Result Value Ref Range Status   Specimen Description BLOOD RIGHT FOREARM  Final   Special Requests BOTTLES DRAWN AEROBIC ONLY  5CC  Final   Culture NO GROWTH 1 DAY  Final   Report Status PENDING  Incomplete  Blood Culture (routine x 2)     Status: None (Preliminary result)   Collection Time: 05/27/16  8:29 AM  Result Value Ref Range Status   Specimen Description BLOOD RIGHT HAND  Final   Special Requests IN PEDIATRIC BOTTLE  2CC  Final   Culture NO GROWTH 1 DAY  Final   Report Status PENDING  Incomplete  Urine culture     Status: Abnormal   Collection Time: 05/27/16  8:53 AM  Result Value Ref Range Status   Specimen Description URINE, CATHETERIZED  Final   Special Requests NONE  Final   Culture 80,000  COLONIES/mL ESCHERICHIA COLI (A)  Final   Report Status 05/29/2016 FINAL  Final   Organism ID, Bacteria ESCHERICHIA COLI (A)  Final      Susceptibility   Escherichia coli - MIC*    AMPICILLIN 4 SENSITIVE Sensitive     CEFAZOLIN <=4 SENSITIVE Sensitive     CEFTRIAXONE <=1 SENSITIVE Sensitive     CIPROFLOXACIN <=0.25 SENSITIVE Sensitive     GENTAMICIN <=1 SENSITIVE Sensitive     IMIPENEM <=0.25 SENSITIVE Sensitive     NITROFURANTOIN <=16 SENSITIVE Sensitive     TRIMETH/SULFA <=20 SENSITIVE Sensitive     AMPICILLIN/SULBACTAM <=2 SENSITIVE Sensitive     PIP/TAZO <=4 SENSITIVE Sensitive     Extended ESBL NEGATIVE Sensitive     * 80,000 COLONIES/mL ESCHERICHIA COLI     Time coordinating discharge: 40 minutes  SIGNED:  Dessa Phi, DO [facility] Pager [phone]  If 7PM-7AM, please contact night-coverage [email or web address] Password [facility] 05/29/2016, 9:51 AM

## 2016-05-29 NOTE — Discharge Instructions (Signed)
Information on my medicine - ELIQUIS® (apixaban) ° ° °What do You need to know about Eliquis® ? °Take your Eliquis® TWICE DAILY - one tablet in the morning and one tablet in the evening with or without food.  It would be best to take the doses about the same time each day. ° °If you have difficulty swallowing the tablet whole please discuss with your pharmacist how to take the medication safely. ° °Take Eliquis® exactly as prescribed by your doctor and DO NOT stop taking Eliquis® without talking to the doctor who prescribed the medication.  Stopping may increase your risk of developing a new clot or stroke.  Refill your prescription before you run out. ° °After discharge, you should have regular check-up appointments with your healthcare provider that is prescribing your Eliquis®.  In the future your dose may need to be changed if your kidney function or weight changes by a significant amount or as you get older. ° °What do you do if you miss a dose? °If you miss a dose, take it as soon as you remember on the same day and resume taking twice daily.  Do not take more than one dose of ELIQUIS at the same time. ° °Important Safety Information °A possible side effect of Eliquis® is bleeding. You should call your healthcare provider right away if you experience any of the following: °? Bleeding from an injury or your nose that does not stop. °? Unusual colored urine (red or dark brown) or unusual colored stools (red or black). °? Unusual bruising for unknown reasons. °? A serious fall or if you hit your head (even if there is no bleeding). ° °Some medicines may interact with Eliquis® and might increase your risk of bleeding or clotting while on Eliquis®. To help avoid this, consult your healthcare provider or pharmacist prior to using any new prescription or non-prescription medications, including herbals, vitamins, non-steroidal anti-inflammatory drugs (NSAIDs) and supplements. ° °This website has more information on  Eliquis® (apixaban): www.Eliquis.com. ° ° ° °

## 2016-05-29 NOTE — Progress Notes (Signed)
Patient was discharged home by MD order; discharged instructions  review and give to patient and her daughter with care notes and prescriptions; IV DIC; patient will be escorted to the car by nurse tech via wheelchair.

## 2016-05-30 ENCOUNTER — Ambulatory Visit: Payer: Self-pay | Attending: Internal Medicine

## 2016-05-30 MED FILL — CEPHALEXIN 500 MG CAPSULE: 500 | 4 days supply | Qty: 8 | Fill #0

## 2016-06-01 LAB — CULTURE, BLOOD (ROUTINE X 2)
CULTURE: NO GROWTH
CULTURE: NO GROWTH

## 2016-06-01 NOTE — Progress Notes (Signed)
Location of Breast Cancer: Left Breast  Histology per Pathology Report:  Dr. Jana Hakim documents 05/11/16: Status post left mastectomy and sentinel lymph node sampling 07/29/2015 for a pT2 pN1, stage IIB invasive breast cancer, mixed ductal and lobular, strongly estrogen and progesterone receptor positive, HER-2 equivocal in [place].    Receptor Status: ER(POS), PR (POS), Her2-neu (), Ki-()  Did patient present with symptoms or was this found on screening mammography?:  She palpated a mass in her left breast and brought it to medical attention. She underwent mammography, ultrasonography, and biopsy and then proceeded to left mastectomy and axillary lymph node dissection at [facility] in [place].   Past/Anticipated interventions by surgeon, if any: Left mastectomy and axillary lymph node dissection at [facility] in [place] on 07/29/2015.  Past/Anticipated interventions by medical oncology, if any:  Dr. Jana Hakim documents 05/11/16: (1) she received adjuvant chemotherapy consisting (most likely) of cyclophosphamide and doxorubicin 4 given between October and December 2017, then started paclitaxel 04/06/2016, discontinued after one dose because of neuropathy and other complications (2) consider adjuvant radiation (3) anastrozole started 05/11/2016 (4) pulmonary embolus and right lower extremity deep vein thrombosis diagnosed 04/24/2016,             (a) status post enoxaparin 04/24/2016 through 05/03/2016             (b) transitioned to apixaban 05/03/2016  We normally do postoperative radiation in node-positive patients. She is now 9 months out from her original surgery and I don't think there is very good data for adjuvant radiation at this point. Radiation also is a local form of treatment that it would not be expected to significantly affect survival. Accordingly I don't think she is a good candidate for radiation at this point but I prefer for that  decision to be discussed with her by the radiation oncologist directly and I'm setting her up for a visit with him with that in mind. My expectation now is that she likely will not receive radiation  Lymphedema issues, if any:  No  Pain issues, if any:  When voiding, burning still feels like when she gets a cut, recent UTI, also difficulty swallowing, chokes with food and fluids,   SAFETY ISSUES: Yes, ambulates with a walker, s/p stroke,   Prior radiation?  No  Pacemaker/ICD? No  Possible current pregnancy? NO  Is the patient on methotrexate? NO  Current Complaints / other details:  She was admitted to the hospital 2/16- 2/18. She had experiences weakness since her previous discharge on 05/23/16  BP (!) 105/59 (BP Location: Right Arm, Patient Position: Sitting, Cuff Size: Normal)   Pulse 78   Temp 98.1 F (36.7 C) (Oral)   Resp 20   Ht '5\' 2"'$  (1.575 m)   Wt 184 lb 3.2 oz (83.6 kg)   BMI 33.69 kg/m   .j Wt Readings from Last 3 Encounters:  06/07/16 184 lb 3.2 oz (83.6 kg)  06/02/16 183 lb 4 oz (83.1 kg)  05/29/16 191 lb 4.8 oz (86.8 kg)      Malmfelt, Stephani Police, RN 06/01/2016,3:02 PM

## 2016-06-02 ENCOUNTER — Encounter (HOSPITAL_COMMUNITY): Payer: Self-pay | Admitting: Internal Medicine

## 2016-06-02 ENCOUNTER — Ambulatory Visit (HOSPITAL_COMMUNITY)
Admission: RE | Admit: 2016-06-02 | Discharge: 2016-06-02 | Disposition: A | Discharge status: Home / Self Care | Payer: Medicare Other | Source: Ambulatory Visit | Attending: Internal Medicine | Admitting: Internal Medicine

## 2016-06-02 VITALS — BP 94/56 | HR 74 | Wt 183.2 lb

## 2016-06-02 DIAGNOSIS — Z86711 Personal history of pulmonary embolism: Secondary | ICD-10-CM | POA: Insufficient documentation

## 2016-06-02 DIAGNOSIS — I2602 Saddle embolus of pulmonary artery with acute cor pulmonale: Secondary | ICD-10-CM

## 2016-06-02 DIAGNOSIS — Z7984 Long term (current) use of oral hypoglycemic drugs: Secondary | ICD-10-CM | POA: Diagnosis not present

## 2016-06-02 DIAGNOSIS — I2782 Chronic pulmonary embolism: Secondary | ICD-10-CM

## 2016-06-02 DIAGNOSIS — I110 Hypertensive heart disease with heart failure: Secondary | ICD-10-CM | POA: Diagnosis not present

## 2016-06-02 DIAGNOSIS — I5022 Chronic systolic (congestive) heart failure: Secondary | ICD-10-CM

## 2016-06-02 DIAGNOSIS — I959 Hypotension, unspecified: Secondary | ICD-10-CM | POA: Diagnosis not present

## 2016-06-02 DIAGNOSIS — Z79899 Other long term (current) drug therapy: Secondary | ICD-10-CM | POA: Insufficient documentation

## 2016-06-02 DIAGNOSIS — Z8673 Personal history of transient ischemic attack (TIA), and cerebral infarction without residual deficits: Secondary | ICD-10-CM | POA: Diagnosis not present

## 2016-06-02 DIAGNOSIS — Z5189 Encounter for other specified aftercare: Secondary | ICD-10-CM | POA: Diagnosis not present

## 2016-06-02 DIAGNOSIS — Z853 Personal history of malignant neoplasm of breast: Secondary | ICD-10-CM | POA: Insufficient documentation

## 2016-06-02 DIAGNOSIS — E119 Type 2 diabetes mellitus without complications: Secondary | ICD-10-CM | POA: Diagnosis not present

## 2016-06-02 LAB — BASIC METABOLIC PANEL
Anion gap: 13 (ref 5–15)
BUN: 34 mg/dL — AB (ref 6–20)
CALCIUM: 10 mg/dL (ref 8.9–10.3)
CO2: 21 mmol/L — ABNORMAL LOW (ref 22–32)
CREATININE: 1.95 mg/dL — AB (ref 0.44–1.00)
Chloride: 100 mmol/L — ABNORMAL LOW (ref 101–111)
GFR calc Af Amer: 29 mL/min — ABNORMAL LOW (ref >60)
GFR, EST NON AFRICAN AMERICAN: 25 mL/min — AB (ref >60)
Glucose, Bld: 163 mg/dL — ABNORMAL HIGH (ref 65–99)
POTASSIUM: 5 mmol/L (ref 3.5–5.1)
SODIUM: 134 mmol/L — AB (ref 135–145)

## 2016-06-02 MED ORDER — FUROSEMIDE 40 MG PO TABS
40.0000 mg | ORAL_TABLET | ORAL | 1 refills | Status: DC | PRN
Start: 2016-06-02 — End: 2016-06-07

## 2016-06-02 MED ORDER — CARVEDILOL 6.25 MG PO TABS: 6.2500 mg | ORAL_TABLET | Freq: Two times a day (BID) | ORAL | 3 refills | Status: DC

## 2016-06-02 NOTE — Progress Notes (Signed)
 [facility] CONSULT NOTE  Referring Physician: Primary Care: Primary Cardiologist:  HPI:  Carrie Watts is a 65 y.o. female with a history of HTN, DM, L breast cancer s/p mastectomy and 5 rounds of chemo with cyclophosphamide,adriamycin and Taxol) in [place] and recent admission for PE and DVT at [facility] (04/24/16-05/03/16)  She flew back from [place] 1st or 2nd week of Jan 2018. Came to ER 1/14 with R sided facial droop and difficulty speaking and sob. Workup done in the ED showed a saddle pulmonary emboli. LE venous doppler showed right LE DVT. Her PE could be related to long flight travel vs. Hypercoagulable state secondary to breast cancer. Ruled out for stroke, MRI/MRA brain unremarkable, echo with bubble study stable, carotid duplex nonacute. Started on  on Lovenox, transitioned to apixaban at discharge. Her EF was 55-60% 04/24/16.   Seen oncology clinic 05/11/16. Patient continued to have dyspnea since discharge with intermittent chest pain. Repeat CTA showed resolving clot burden. Mild cardiomegaly. Borderline, improved RIGHT heart strain (RV/LV 0.9, previously 1.2). Repeat echo 2/18 showed EF 40-45% Mild to moderate global reduction in LV systolic function; grade 1 diastolic dysfunction; mild LVH; mild MR; trace TR with mildly elevated pulmonary pressure. Normal RV function. Cath with very mild CAD  Feeling much better. Cough has resolved. More energy but still weak. Has neuropathy in feet. No edema orthopnea or PND. BP is low and gets dizzy at times - much worse after taking her pills in am. Weight stable. No bleeding on Eliquis.   Cath 05/18/16  Mid RCA lesion, 10 %stenosed.  Mid LAD lesion, 40 %stenosed.  Mid Cx lesion, 10 %stenosed.  The left ventricular ejection fraction is 40-45% by visual estimate.  There is mild left ventricular systolic dysfunction.  Vasospasm noted in the right radial artery.  LV end diastolic pressure is normal, 11 mm Hg.      Past Medical  History:  Diagnosis Date  . Angina of effort (HCC)   . Atrial fibrillation (HCC)   . Cancer (HCC)    breast  . CHF (congestive heart failure) (HCC)   . Deep venous thrombosis (HCC)   . Diabetes mellitus without complication (HCC)   . Hypercholesterolemia   . Hypertension   . Pulmonary embolism (HCC)   . Renal disorder   . Stroke (HCC)     Current Outpatient Prescriptions  Medication Sig Dispense Refill  . anastrozole (ARIMIDEX) 1 MG tablet Take 1 tablet (1 mg total) by mouth daily. 30 tablet 1  . apixaban (ELIQUIS) 5 MG TABS tablet Take 1 tablet (5 mg total) by mouth 2 (two) times daily. 60 tablet 0  . atorvastatin (LIPITOR) 20 MG tablet Take 1 tablet (20 mg total) by mouth at bedtime. Can switch to a cheaper statin if needed 30 tablet 0  . carvedilol (COREG) 12.5 MG tablet Take 1 tablet (12.5 mg total) by mouth 2 (two) times daily with a meal. 60 tablet 1  . cephALEXin (KEFLEX) 500 MG capsule Take 1 capsule (500 mg total) by mouth every 12 (twelve) hours. 8 capsule 0  . feeding supplement, GLUCERNA SHAKE, (GLUCERNA SHAKE) LIQD Take 237 mLs by mouth 3 (three) times daily between meals. 30 Can 0  . folic acid (FOLVITE) 800 MCG tablet Take 800 mcg by mouth every morning.     . furosemide (LASIX) 40 MG tablet Take 1 tablet (40 mg total) by mouth daily. 30 tablet 1  . glipiZIDE (GLUCOTROL XL) 5 MG 24 hr tablet Take 5   mg by mouth daily as needed. For blood sugar >250    . glucose monitoring kit (FREESTYLE) monitoring kit 1 each by Does not apply route 4 (four) times daily - after meals and at bedtime. 1 month Diabetic Testing Supplies for QAC-QHS accuchecks.  Diagnosis E11.65. Any brand OK 1 each 1  . losartan (COZAAR) 50 MG tablet Take 1 tablet (50 mg total) by mouth daily. 30 tablet 1  . metFORMIN (GLUCOPHAGE) 1000 MG tablet Take 1 tablet (1,000 mg total) by mouth 2 (two) times daily with a meal. 60 tablet 0  . Multiple Vitamin (MULTIVITAMIN WITH MINERALS) TABS tablet Take 1 tablet by  mouth every morning.     [name] omeprazole (PRILOSEC) 20 MG capsule Take 20 mg by mouth every morning.     . ondansetron (ZOFRAN) 4 MG tablet Take 1 tablet (4 mg total) by mouth every 8 (eight) hours as needed for nausea or vomiting. 30 tablet 1  . potassium chloride (K-DUR,KLOR-CON) 10 MEQ tablet Take 1 tablet (10 mEq total) by mouth daily. 30 tablet 1  . predniSONE (DELTASONE) 10 MG tablet Take 1 tablet (10 mg total) by mouth daily with breakfast. 2 tablets daily x 4 days then 1 daily x 3 days 11 tablet 0  . ranitidine (ZANTAC) 300 MG tablet Take 300 mg by mouth at bedtime.     No current facility-administered medications for this encounter.     Allergies  Allergen Reactions  . Dust Mite Extract       Social History   Social History  . Marital status: Divorced    Spouse name: N/A  . Number of children: N/A  . Years of education: N/A   Occupational History  . Not on file.   Social History Main Topics  . Smoking status: Never Smoker  . Smokeless tobacco: Never Used  . Alcohol use No  . Drug use: No  . Sexual activity: Not on file   Other Topics Concern  . Not on file   Social History Narrative  . No narrative on file      Family History  Problem Relation Age of Onset  . Heart attack Mother   . Clotting disorder Neg Hx     Vitals:   06/02/16 1431  BP: (!) 94/56  Pulse: 74  SpO2: 100%  Weight: 183 lb 4 oz (83.1 kg)    PHYSICAL EXAM: General:  Weak appearing. No respiratory difficulty HEENT: normal x for alopecia Neck: supple. no JVD. Carotids 2+ bilat; no bruits. No lymphadenopathy or thryomegaly appreciated. Cor: PMI nondisplaced. Regular rate & rhythm. No rubs, gallops or murmurs. Lungs: clear Abdomen: soft, nontender, nondistended. No hepatosplenomegaly. No bruits or masses. Good bowel sounds. Extremities: no cyanosis, clubbing, rash, edema Neuro: alert & oriented x 3, cranial nerves grossly intact. moves all 4 extremities w/o difficulty. Affect  pleasant.   ASSESSMENT & PLAN:  1. Chronic systolic HF    --EF 14-48% in 2/18. Cath with minimal CAD. ? Related to adriamycin versus stress related to massive PE    --NYHA III. Volume status low. Orthostatic symptoms    --Stop lasix and KCl    --Cut carvedilol in half to 6.25 bid    --Continue losartan    --Labs today. Repeat echo in several months.  2. Recent saddle PE/DVT    --Continue Eliquis 3. Breast CA    --Per Oncology. Has complete neo-adjuvant chemo including Adriamycin 4. Hypotension    --As above. Hold lasix. Cut carvedilol  Jevaughn Degollado,MD  10:23 PM

## 2016-06-02 NOTE — Patient Instructions (Signed)
Decrease Coreg to 6.25 mg (1 Tablet) Two times Daily  Stop Lasix and Potassium  Only take Lasix as needed for swelling or weight gain.  Labs today (will call for abnormal results, otherwise no news is good news)  Follow up in 2 weeks.

## 2016-06-03 ENCOUNTER — Telehealth (HOSPITAL_COMMUNITY): Payer: Self-pay | Admitting: *Deleted

## 2016-06-03 ENCOUNTER — Other Ambulatory Visit (INDEPENDENT_AMBULATORY_CARE_PROVIDER_SITE_OTHER): Payer: Medicare Other

## 2016-06-03 DIAGNOSIS — E785 Hyperlipidemia, unspecified: Secondary | ICD-10-CM | POA: Diagnosis not present

## 2016-06-03 DIAGNOSIS — E119 Type 2 diabetes mellitus without complications: Secondary | ICD-10-CM | POA: Diagnosis not present

## 2016-06-03 DIAGNOSIS — I509 Heart failure, unspecified: Secondary | ICD-10-CM

## 2016-06-03 DIAGNOSIS — I10 Essential (primary) hypertension: Secondary | ICD-10-CM | POA: Diagnosis not present

## 2016-06-03 LAB — CBC WITH DIFFERENTIAL/PLATELET
BASOS ABS: 0 {cells}/uL (ref 0–200)
BASOS PCT: 0 %
EOS ABS: 146 {cells}/uL (ref 15–500)
Eosinophils Relative: 2 %
HCT: 35 % (ref 35.0–45.0)
HEMOGLOBIN: 11.2 g/dL — AB (ref 11.7–15.5)
LYMPHS ABS: 1387 {cells}/uL (ref 850–3900)
Lymphocytes Relative: 19 %
MCH: 30.7 pg (ref 27.0–33.0)
MCHC: 32 g/dL (ref 32.0–36.0)
MCV: 95.9 fL (ref 80.0–100.0)
MONOS PCT: 5 %
MPV: 9.8 fL (ref 7.5–12.5)
Monocytes Absolute: 365 cells/uL (ref 200–950)
NEUTROS ABS: 5402 {cells}/uL (ref 1500–7800)
Neutrophils Relative %: 74 %
PLATELETS: 304 10*3/uL (ref 140–400)
RBC: 3.65 MIL/uL — ABNORMAL LOW (ref 3.80–5.10)
RDW: 16.7 % — ABNORMAL HIGH (ref 11.0–15.0)
WBC: 7.3 10*3/uL (ref 3.8–10.8)

## 2016-06-03 MED FILL — CARVEDILOL 6.25 MG TABLET: 6.25 | 30 days supply | Qty: 60 | Fill #0

## 2016-06-03 MED FILL — ?FUROSEMIDE 40 MG TABLET: 40 | 30 days supply | Qty: 30 | Fill #0

## 2016-06-03 NOTE — Telephone Encounter (Signed)
Notes Recorded by Harvie Junior, CMA on 06/03/2016 at 3:15 PM EST Spoke with pts daughter and was able to have her labs scheduled for 3/2 ------  Notes Recorded by Harvie Junior, [place] on 06/03/2016 at 3:12 PM EST Labs added to 3/8 office visit. ------  Notes Recorded by Jolaine Artist, MD on 06/02/2016 at 6:06 PM EST She is dry and renal function worse. Lasix stopped in clinic. Recheck BMET next week.    Ref Range & Units 1d ago 5d ago 6d ago   Sodium 135 - 145 mmol/L 134   134CM   141    Potassium 3.5 - 5.1 mmol/L 5.0  3.5  4.2    Chloride 101 - 111 mmol/L 100   99   106    CO2 22 - 32 mmol/L 21   23  23     Glucose, Bld 65 - 99 mg/dL 163   181   143     BUN 6 - 20 mg/dL 34   19  19    Creatinine, Ser 0.44 - 1.00 mg/dL 1.95   0.93  0.99    Calcium 8.9 - 10.3 mg/dL 10.0  9.4  9.6    GFR calc non Af Amer >60 mL/min 25   >60  58     GFR calc Af Amer >60 mL/min 29   >60CM  >60CM

## 2016-06-04 LAB — COMPLETE METABOLIC PANEL WITH GFR
ALBUMIN: 3.7 g/dL (ref 3.6–5.1)
ALK PHOS: 60 U/L (ref 33–130)
ALT: 38 U/L — AB (ref 6–29)
AST: 33 U/L (ref 10–35)
BILIRUBIN TOTAL: 0.4 mg/dL (ref 0.2–1.2)
BUN: 40 mg/dL — ABNORMAL HIGH (ref 7–25)
CO2: 15 mmol/L — AB (ref 20–31)
CREATININE: 2.17 mg/dL — AB (ref 0.50–0.99)
Calcium: 9.8 mg/dL (ref 8.6–10.4)
Chloride: 99 mmol/L (ref 98–110)
GFR, EST AFRICAN AMERICAN: 26 mL/min — AB (ref >=60)
GFR, Est Non African American: 23 mL/min — ABNORMAL LOW (ref >=60)
Glucose, Bld: 121 mg/dL — ABNORMAL HIGH (ref 65–99)
Potassium: 4.9 mmol/L (ref 3.5–5.3)
SODIUM: 134 mmol/L — AB (ref 135–146)
TOTAL PROTEIN: 6.9 g/dL (ref 6.1–8.1)

## 2016-06-04 LAB — LIPID PANEL
Cholesterol: 165 mg/dL (ref <200)
HDL: 56 mg/dL (ref >50)
LDL Cholesterol: 46 mg/dL (ref <100)
Total CHOL/HDL Ratio: 2.9 Ratio (ref <5.0)
Triglycerides: 316 mg/dL — ABNORMAL HIGH (ref <150)
VLDL: 63 mg/dL — ABNORMAL HIGH (ref <30)

## 2016-06-07 ENCOUNTER — Ambulatory Visit
Admission: RE | Admit: 2016-06-07 | Discharge: 2016-06-07 | Disposition: A | Discharge status: Home / Self Care | Payer: Medicare Other | Source: Ambulatory Visit | Attending: Radiation Oncology | Admitting: Radiation Oncology

## 2016-06-07 ENCOUNTER — Encounter: Payer: Self-pay | Admitting: Radiation Oncology

## 2016-06-07 ENCOUNTER — Other Ambulatory Visit: Payer: Self-pay | Admitting: *Deleted

## 2016-06-07 ENCOUNTER — Telehealth: Payer: Self-pay | Admitting: *Deleted

## 2016-06-07 VITALS — BP 105/59 | HR 78 | Temp 98.1°F | Resp 20 | Ht 62.0 in | Wt 184.2 lb

## 2016-06-07 DIAGNOSIS — E119 Type 2 diabetes mellitus without complications: Secondary | ICD-10-CM | POA: Diagnosis not present

## 2016-06-07 DIAGNOSIS — B379 Candidiasis, unspecified: Secondary | ICD-10-CM

## 2016-06-07 DIAGNOSIS — C50812 Malignant neoplasm of overlapping sites of left female breast: Secondary | ICD-10-CM

## 2016-06-07 DIAGNOSIS — C50112 Malignant neoplasm of central portion of left female breast: Secondary | ICD-10-CM | POA: Insufficient documentation

## 2016-06-07 DIAGNOSIS — I509 Heart failure, unspecified: Secondary | ICD-10-CM | POA: Diagnosis not present

## 2016-06-07 DIAGNOSIS — R1319 Other dysphagia: Secondary | ICD-10-CM | POA: Diagnosis not present

## 2016-06-07 DIAGNOSIS — Z170 Estrogen receptor positive status [ER+]: Secondary | ICD-10-CM | POA: Insufficient documentation

## 2016-06-07 DIAGNOSIS — Z86711 Personal history of pulmonary embolism: Secondary | ICD-10-CM | POA: Insufficient documentation

## 2016-06-07 DIAGNOSIS — I10 Essential (primary) hypertension: Secondary | ICD-10-CM | POA: Insufficient documentation

## 2016-06-07 DIAGNOSIS — E7800 Pure hypercholesterolemia, unspecified: Secondary | ICD-10-CM | POA: Insufficient documentation

## 2016-06-07 DIAGNOSIS — Z7901 Long term (current) use of anticoagulants: Secondary | ICD-10-CM | POA: Diagnosis not present

## 2016-06-07 DIAGNOSIS — Z7984 Long term (current) use of oral hypoglycemic drugs: Secondary | ICD-10-CM | POA: Insufficient documentation

## 2016-06-07 DIAGNOSIS — Z79899 Other long term (current) drug therapy: Secondary | ICD-10-CM | POA: Diagnosis not present

## 2016-06-07 DIAGNOSIS — I4891 Unspecified atrial fibrillation: Secondary | ICD-10-CM | POA: Insufficient documentation

## 2016-06-07 MED ORDER — FLUCONAZOLE 100 MG PO TABS: ORAL_TABLET | ORAL | 0 refills | Status: DC

## 2016-06-07 NOTE — Telephone Encounter (Signed)
CALLED PATIENT TO  INFORM OF MBS ON 06-13-16- ARRIVAL TIME - 10:45 AM @ [facility] , NO RESTRICTIONS TO TEST AND HER APPT. WITH [facility] [place] ON 06-27-16 - ARRIVAL TIME - 12:45 PM @ [facility], SPOKE WITH PATIENT'S DAUGHTER JILLIAN BJOERK AND SHE IS AWARE OF THESE APPTS.

## 2016-06-07 NOTE — Progress Notes (Signed)
Location of Breast Cancer: Left Breast  Histology per Pathology Report:  Dr. Jana Hakim documents 05/11/16: Status post left mastectomy and sentinel lymph node sampling 07/29/2015 for a pT2 pN1, stage IIB invasive breast cancer, mixed ductal and lobular, strongly estrogen and progesterone receptor positive, HER-2 equivocal in [place].    Receptor Status: ER(POS), PR (POS), Her2-neu (), Ki-()  Did patient present with symptoms or was this found on screening mammography?:  She palpated a mass in her left breast and brought it to medical attention. She underwent mammography, ultrasonography, and biopsy and then proceeded to left mastectomy and axillary lymph node dissection at [facility] in [place].   Past/Anticipated interventions by surgeon, if any: Left mastectomy and axillary lymph node dissection at [facility] in [place] on 07/29/2015.  Past/Anticipated interventions by medical oncology, if any:  Dr. Jana Hakim documents 05/11/16: (1) she received adjuvant chemotherapy consisting (most likely) of cyclophosphamide and doxorubicin 4 given between October and December 2017, then started paclitaxel 04/06/2016, discontinued after one dose because of neuropathy and other complications (2) consider adjuvant radiation (3) anastrozole started 05/11/2016 (4) pulmonary embolus and right lower extremity deep vein thrombosis diagnosed 04/24/2016,             (a) status post enoxaparin 04/24/2016 through 05/03/2016             (b) transitioned to apixaban 05/03/2016  We normally do postoperative radiation in node-positive patients. She is now 9 months out from her original surgery and I don't think there is very good data for adjuvant radiation at this point. Radiation also is a local form of treatment that it would not be expected to significantly affect survival. Accordingly I don't think she is a good candidate for radiation at this point but I prefer for that  decision to be discussed with her by the radiation oncologist directly and I'm setting her up for a visit with him with that in mind. My expectation now is that she likely will not receive radiation  Lymphedema issues, if any:    Pain issues, if any:    SAFETY ISSUES:  Prior radiation? NO  Pacemaker/ICD? NO  Possible current pregnancy? NO  Is the patient on methotrexate?  NO  Current Complaints / other details:   She was admitted to the hospital 2/16- 2/18. She had experiences weakness since her previous discharge on 05/23/16     Rebecca Eaton, RN 06/07/2016,7:21 AM

## 2016-06-07 NOTE — Addendum Note (Signed)
Encounter addended by: Ernst Spell, RN on: 06/07/2016  9:22 AM<BR>    Actions taken: Charge Capture section accepted

## 2016-06-07 NOTE — Progress Notes (Signed)
Radiation Oncology         [phone] ________________________________  Initial outpatient Consultation  Name: Carrie Watts MRN: [id number]  Date: 06/07/2016  DOB: [birth year]  CC:Carrie Dew, FNP  Magrinat, Carrie Dad, MD   REFERRING PHYSICIAN: Magrinat, Carrie Dad, MD  DIAGNOSIS:    ICD-9-CM ICD-10-CM   1. Yeast infection 112.9 B37.9 fluconazole (DIFLUCAN) 100 MG tablet  2. Other dysphagia 787.29 R13.19 SLP modified barium swallow     Ambulatory Referral to Neuro Rehab  3. Carcinoma of central portion of left breast in female, estrogen receptor positive ([place]) 174.1 C50.112    V86.0 Z17.0      Stage IIB pT2 pN1a cM0 Left Breast Invasive Lobular/ Ductal Carcinoma, ER+ / PR+ / Her2 equiv, Grade 2  CHIEF COMPLAINT: Here to discuss management of left breast cancer  HISTORY OF PRESENT ILLNESS::Carrie Watts is a 66 y.o. female who presented with a left breast mass in [place] in 2016.  Despite our best efforts, [place] records from [place] could not all be obtained.  From what I have read, she ultimately underwent total mastectomy with dissection of 14 axillary nodes in [place] on 07-29-15. From what I can gather, this revealed a 2.1cm tumor with negative margins and characteristics as above in the diagnosis, and 2/14 + nodes.  She did not receive adjuvant RT.  She received an abbreviated course of chemotherapy which was stopped due to tolerance issues - chemotherapy received between Oct-Dec 2017.  She was hospitalized here at [facility] recently with a saddle embolus.  She is s/p stroke with mobility issues and dysphagia.  She uses a walker.  She is alone in clinic today.  She has recent burning of the labia.  No bladder complaints. She has started antiestrogen therapy. Followed by Dr Carrie Watts.  PREVIOUS RADIATION THERAPY: No  PAST MEDICAL HISTORY:  has a past medical history of Angina of effort ([place]); Atrial fibrillation ([place]); Cancer [facility]); CHF (congestive heart failure) ([place]); Deep  venous thrombosis ([place]); Diabetes mellitus without complication ([place]); Hypercholesterolemia; Hypertension; Pulmonary embolism ([place]); Renal disorder; and Stroke ([place]).    PAST SURGICAL HISTORY: Past Surgical History:  Procedure Laterality Date  . BREAST SURGERY    . COLONOSCOPY N/A 05/16/2016   Procedure: COLONOSCOPY;  Surgeon: Carrie Banister, MD;  Location: [place];  Service: Endoscopy;  Laterality: N/A;  . ESOPHAGOGASTRODUODENOSCOPY N/A 05/16/2016   Procedure: ESOPHAGOGASTRODUODENOSCOPY (EGD);  Surgeon: Carrie Banister, MD;  Location: [place];  Service: Endoscopy;  Laterality: N/A;  . LEFT HEART CATH AND CORONARY ANGIOGRAPHY N/A 05/18/2016   Procedure: Left Heart Cath and Coronary Angiography;  Surgeon: Carrie Booze, MD;  Location: [place] [facility];  Service: Cardiovascular;  Laterality: N/A;  . PARTIAL HYSTERECTOMY      FAMILY HISTORY: family history includes Heart attack in her mother.  SOCIAL HISTORY:  reports that she has never smoked. She has never used smokeless tobacco. She reports that she does not drink alcohol or use drugs.  ALLERGIES: Dust mite extract  MEDICATIONS:  Current Outpatient Prescriptions  Medication Sig Dispense Refill  . anastrozole (ARIMIDEX) 1 MG tablet Take 1 tablet (1 mg total) by mouth daily. 30 tablet 1  . apixaban (ELIQUIS) 5 MG TABS tablet Take 1 tablet (5 mg total) by mouth 2 (two) times daily. 60 tablet 0  . atorvastatin (LIPITOR) 20 MG tablet Take 1 tablet (20 mg total) by mouth at bedtime. Can switch to a cheaper statin if needed 30 tablet 0  . carvedilol (COREG) 6.25  MG tablet Take 1 tablet (6.25 mg total) by mouth 2 (two) times daily with a meal. 60 tablet 3  . feeding supplement, GLUCERNA SHAKE, (GLUCERNA SHAKE) LIQD Take 237 mLs by mouth 3 (three) times daily between meals. 30 Can 0  . folic acid (FOLVITE) 401 MCG tablet Take 800 mcg by mouth every morning.     [name] glipiZIDE (GLUCOTROL XL) 5 MG 24 hr tablet Take 5 mg by mouth daily as  needed. For blood sugar >250    . glucose monitoring kit (FREESTYLE) monitoring kit 1 each by Does not apply route 4 (four) times daily - after meals and at bedtime. 1 month Diabetic Testing Supplies for QAC-QHS accuchecks.  Diagnosis E11.65. Any brand OK 1 each 1  . losartan (COZAAR) 50 MG tablet Take 1 tablet (50 mg total) by mouth daily. 30 tablet 1  . metFORMIN (GLUCOPHAGE) 1000 MG tablet Take 1 tablet (1,000 mg total) by mouth 2 (two) times daily with a meal. 60 tablet 0  . Multiple Vitamin (MULTIVITAMIN WITH MINERALS) TABS tablet Take 1 tablet by mouth every morning.     [name] omeprazole (PRILOSEC) 20 MG capsule Take 20 mg by mouth every morning.     . ondansetron (ZOFRAN) 4 MG tablet Take 1 tablet (4 mg total) by mouth every 8 (eight) hours as needed for nausea or vomiting. 30 tablet 1  . ranitidine (ZANTAC) 300 MG tablet Take 300 mg by mouth at bedtime.    . fluconazole (DIFLUCAN) 100 MG tablet Take 2 tablets today, then 1 tablet daily for 4 more days. Hold atorvastatin (Lipitor) while on this medication. 6 tablet 0  . predniSONE (DELTASONE) 10 MG tablet Take 1 tablet (10 mg total) by mouth daily with breakfast. 2 tablets daily x 4 days then 1 daily x 3 days (Patient not taking: Reported on 06/07/2016) 11 tablet 0   No current facility-administered medications for this encounter.     REVIEW OF SYSTEMS: as above   PHYSICAL EXAM:  height is _0  (1.575 m) and weight is 184 lb 3.2 oz (83.6 kg). Her oral temperature is 98.1 F (36.7 C). Her blood pressure is 105/59 (abnormal) and her pulse is 78. Her respiration is 20.   General: Alert and oriented, in no acute distress HEENT: Head is normocephalic. Extraocular movements are intact.   Neck: Neck is supple, no palpable cervical or supraclavicular lymphadenopathy. Lymphatics: see Neck Exam Skin: dry skin Musculoskeletal: uses walker Psychiatric: Judgment and insight are intact. Affect is appropriate. Breasts: left mastectomy scar has healed  well; she appears to have residual breast tissue . No palpable masses appreciated in the breasts, chest wall,  or axillae bilaterally . GYN: labia /perineum is erythematous with yeastlike rash and open sores most consistent with yeast infection.  ECOG = 3  0 - Asymptomatic (Fully active, able to carry on all predisease activities without restriction)  1 - Symptomatic but completely ambulatory (Restricted in physically strenuous activity but ambulatory and able to carry out work of a light or sedentary nature. For example, light housework, office work)  2 - Symptomatic, <50% in bed during the day (Ambulatory and capable of all self care but unable to carry out any work activities. Up and about more than 50% of waking hours)  3 - Symptomatic, >50% in bed, but not bedbound (Capable of only limited self-care, confined to bed or chair 50% or more of waking hours)  4 - Bedbound (Completely disabled. Cannot carry on any self-care. Totally confined  to bed or chair)  5 - Death   Eustace Pen MM, Creech RH, Tormey DC, et al. [phone]). "Toxicity and response criteria of the [facility]". [place] Oncol. 5 (6): 649-55   LABORATORY DATA:  Lab Results  Component Value Date   WBC 7.3 06/03/2016   HGB 11.2 (L) 06/03/2016   HCT 35.0 06/03/2016   MCV 95.9 06/03/2016   PLT 304 06/03/2016   CMP     Component Value Date/Time   NA 134 (L) 06/03/2016 1119   NA 137 05/11/2016 1507   K 4.9 06/03/2016 1119   K 4.7 05/11/2016 1507   CL 99 06/03/2016 1119   CO2 15 (L) 06/03/2016 1119   CO2 22 05/11/2016 1507   GLUCOSE 121 (H) 06/03/2016 1119   GLUCOSE 114 05/11/2016 1507   BUN 40 (H) 06/03/2016 1119   BUN 11.6 05/11/2016 1507   CREATININE 2.17 (H) 06/03/2016 1119   CREATININE 0.9 05/11/2016 1507   CALCIUM 9.8 06/03/2016 1119   CALCIUM 10.2 05/11/2016 1507   PROT 6.9 06/03/2016 1119   PROT 7.4 05/11/2016 1507   ALBUMIN 3.7 06/03/2016 1119   ALBUMIN 2.8 (L) 05/11/2016 1507   AST  33 06/03/2016 1119   AST 32 05/11/2016 1507   ALT 38 (H) 06/03/2016 1119   ALT 24 05/11/2016 1507   ALKPHOS 60 06/03/2016 1119   ALKPHOS 101 05/11/2016 1507   BILITOT 0.4 06/03/2016 1119   BILITOT 0.50 05/11/2016 1507   GFRNONAA 23 (L) 06/03/2016 1119   GFRAA 26 (L) 06/03/2016 1119         RADIOGRAPHY: Dg Chest 2 View  Result Date: 05/11/2016 CLINICAL DATA:  Left-sided chest pain extending to the mandible and shoulder. Cough. Elevated troponin. EXAM: CHEST  2 VIEW COMPARISON:  05/11/2016 at 14:11 FINDINGS: Persistent left base opacity is unchanged and could represent pneumonia or evolving pulmonary infarction. Right lung is clear. Pulmonary vasculature is normal. Hilar and mediastinal contours are unremarkable and unchanged. Mild cardiomegaly, unchanged. IMPRESSION: Left base opacity, with considerations including pneumonia, pulmonary infarction, aspiration. Normal pulmonary vasculature. Electronically Signed   By: Andreas Newport M.D.   On: 05/11/2016 19:49   Dg Chest 2 View  Result Date: 05/11/2016 CLINICAL DATA:  Acute onset of shortness of breath and mid chest pain. Initial encounter. EXAM: CHEST  2 VIEW COMPARISON:  Chest radiograph and CTA of the chest performed 04/24/2016 FINDINGS: The lungs are well-aerated. Mild left basilar airspace opacity raises question for pneumonia. There is no evidence of pleural effusion or pneumothorax. The heart is borderline normal in size. No acute osseous abnormalities are seen. IMPRESSION: Mild left basilar airspace opacity raises question for pneumonia. Residual pulmonary infarct may have a similar appearance, given recent saddle pulmonary embolus. Electronically Signed   By: Garald Balding M.D.   On: 05/11/2016 16:19   Dg Ankle 2 Views Left  Result Date: 05/19/2016 CLINICAL DATA:  Pain for 4 days, worse with bearing weight. No injury. EXAM: LEFT ANKLE - 2 VIEW COMPARISON:  None. FINDINGS: No fracture deformity nor dislocation. The ankle mortise  appears congruent and the tibiofibular syndesmosis intact. Mild midfoot osteoarthrosis. No destructive bony lesions. Diffuse soft tissue swelling without subcutaneous gas or radiopaque foreign bodies. Severe vascular calcifications. IMPRESSION: Soft tissue swelling without acute osseous process. Severe vascular calcifications. Electronically Signed   By: Elon Alas M.D.   On: 05/19/2016 14:10   Ct Angio Chest Pe W And/or Wo Contrast  Result Date: 05/11/2016 CLINICAL DATA:  Cough, shortness  of breath. Recent saddle pulmonary embolus. Now with LEFT pulmonary infiltrate, assess for infarct. History of breast cancer, diabetes, CHF. EXAM: CT ANGIOGRAPHY CHEST WITH CONTRAST TECHNIQUE: Multidetector CT imaging of the chest was performed using the standard protocol during bolus administration of intravenous contrast. Multiplanar CT image reconstructions and MIPs were obtained to evaluate the vascular anatomy. CONTRAST:  80 cc Isovue 370. COMPARISON:  CT chest April 24, 2016. FINDINGS: CARDIOVASCULAR: Adequate contrast opacification of the pulmonary artery's. Main pulmonary artery is not enlarged. Residual pulmonary emboli LEFT lobar pulmonary artery's, predominately central in distribution and occlusive an LEFT lower lobe. Dissolution of the saddle embolic component. Residual nonocclusive embolus RIGHT middle lobar artery. RIGHT lower lobe pulmonary artery filling defect is now peripheral compatible with early chronicity, propagating to the segmental artery's without occlusion. Heart size is mildly enlarged, RV/ LV equals 0.9, previously 1.2. No pericardial effusions. Thoracic aorta is normal course and caliber, trace calcific atherosclerosis. MEDIASTINUM/NODES: No lymphadenopathy by CT size criteria. LUNGS/PLEURA: Tracheobronchial tree is patent, no pneumothorax. Bilateral lower lobe and lingular enhancing atelectasis without wedge-like consolidation to suggest infarct. No pleural effusion. UPPER ABDOMEN:  Included view of the abdomen is unremarkable. MUSCULOSKELETAL: Visualized soft tissues and included osseous structures are nonacute. Broad dextroscoliosis. Mild old T3 and T8 for compression fractures. Review of the MIP images confirms the above findings. IMPRESSION: No new pulmonary emboli. Dissolving, decreased clot burden from prior CT. No pulmonary infarcts. Bibasilar atelectasis. Mild cardiomegaly. Borderline, improved RIGHT heart strain (RV/LV 0.9, previously 1.2). Electronically Signed   By: Elon Alas M.D.   On: 05/11/2016 23:07   Ct Abdomen Pelvis W Contrast  Result Date: 05/27/2016 CLINICAL DATA:  Right lower quadrant pain. History of breast cancer. Hypotension. EXAM: CT ABDOMEN AND PELVIS WITH CONTRAST TECHNIQUE: Multidetector CT imaging of the abdomen and pelvis was performed using the standard protocol following bolus administration of intravenous contrast. CONTRAST:  72m ISOVUE-300 IOPAMIDOL (ISOVUE-300) INJECTION 61% COMPARISON:  05/05/2016 FINDINGS: Lower chest: Airspace disease noted in the left lower lobe, increased since prior study. Right basilar atelectasis or scarring. No effusions. Heart is normal size. Hepatobiliary: No focal hepatic abnormality. Gallbladder unremarkable. Pancreas: No focal abnormality or ductal dilatation. Spleen: No focal abnormality.  Normal size. Adrenals/Urinary Tract: No adrenal abnormality. No focal renal abnormality. No stones or hydronephrosis. Urinary bladder is unremarkable. Stomach/Bowel: Appendix is normal. Sigmoid diverticulosis. No active diverticulitis. Stomach and small bowel unremarkable. Vascular/Lymphatic: No evidence of aneurysm or adenopathy. Reproductive: Prior hysterectomy.  No adnexal masses. Other: No free fluid or free air. Small bilateral inguinal hernias containing fat. Musculoskeletal: No acute bony abnormality. IMPRESSION: No acute findings in the abdomen or pelvis. Sigmoid diverticulosis. Normal appendix. Increasing atelectasis or  infiltrate at the left lung base. Electronically Signed   By: KRolm BaptiseM.D.   On: 05/27/2016 10:28   Dg Chest Port 1 View  Result Date: 05/27/2016 CLINICAL DATA:  Acute presentation with sepsis.  Weakness. EXAM: PORTABLE CHEST 1 VIEW COMPARISON:  05/11/2016 FINDINGS: Heart size is normal by with left ventricular prominence. There is aortic atherosclerosis. Right lung is clear. Patchy density previously seen in the retrocardiac left lower lobe appears improved today but not completely clear, probably. No worsening or new finding. IMPRESSION: Improved aeration at the left base, by this single view. Electronically Signed   By: MNelson ChimesM.D.   On: 05/27/2016 08:43      IMPRESSION/PLAN:  We had a lengthy discussion regarding RT for breast cancer. I would have recommended it adjuvantly months ago.  But, given the delay since surgery, and the post-stroke deficits and medical issues she's has recently, the benefits of RT may not outweigh the risks and side effects significantly.  She is intent on avoiding radiotherapy.  Therefore, she will proceed with anti estrogen therapy alone.  Yeast infection - fluconazole Rx'd, advised to hold lipitor while on this  Dysphagia - MBSS and SLP consult ordered.   I will sign off and release her to medical oncology.  I spent 50 minutes  face to face with the patient and more than 50% of that time was spent in counseling and/or coordination of care.   __________________________________________   Eppie Gibson, MD

## 2016-06-08 ENCOUNTER — Other Ambulatory Visit: Payer: Self-pay | Admitting: Emergency Medicine

## 2016-06-08 ENCOUNTER — Encounter: Payer: Self-pay | Admitting: Oncology

## 2016-06-08 ENCOUNTER — Other Ambulatory Visit: Payer: Self-pay | Admitting: Family Medicine

## 2016-06-08 DIAGNOSIS — C50912 Malignant neoplasm of unspecified site of left female breast: Secondary | ICD-10-CM

## 2016-06-08 DIAGNOSIS — C50112 Malignant neoplasm of central portion of left female breast: Secondary | ICD-10-CM

## 2016-06-08 DIAGNOSIS — Z170 Estrogen receptor positive status [ER+]: Principal | ICD-10-CM

## 2016-06-08 DIAGNOSIS — N184 Chronic kidney disease, stage 4 (severe): Secondary | ICD-10-CM | POA: Insufficient documentation

## 2016-06-08 DIAGNOSIS — C779 Secondary and unspecified malignant neoplasm of lymph node, unspecified: Secondary | ICD-10-CM

## 2016-06-08 DIAGNOSIS — C50812 Malignant neoplasm of overlapping sites of left female breast: Secondary | ICD-10-CM

## 2016-06-08 MED FILL — ?FLUCONAZOLE 100 MG TABLET: 100 | 5 days supply | Qty: 6 | Fill #0

## 2016-06-08 MED FILL — ONDANSETRON HCL 4 MG TABLET: 4 | 10 days supply | Qty: 30 | Fill #0

## 2016-06-08 NOTE — Progress Notes (Unsigned)
Reviewed laboratory values, current GFR is 23. Will continue Losartan 50 mg for hypertension. Will also send a referral to nephrology for further workup and evaluation.    Dorena Dew, FNP

## 2016-06-08 NOTE — Progress Notes (Signed)
Called and left message advising patient to call back today 06/08/2016 @2 :20pm. Thanks!

## 2016-06-09 ENCOUNTER — Other Ambulatory Visit: Payer: Self-pay

## 2016-06-09 ENCOUNTER — Other Ambulatory Visit (HOSPITAL_COMMUNITY): Payer: Self-pay | Admitting: Internal Medicine

## 2016-06-09 ENCOUNTER — Other Ambulatory Visit (HOSPITAL_COMMUNITY): Payer: Self-pay | Admitting: Radiation Oncology

## 2016-06-09 ENCOUNTER — Other Ambulatory Visit: Payer: Self-pay | Admitting: Oncology

## 2016-06-09 DIAGNOSIS — Z1231 Encounter for screening mammogram for malignant neoplasm of breast: Secondary | ICD-10-CM

## 2016-06-09 DIAGNOSIS — R1319 Other dysphagia: Secondary | ICD-10-CM

## 2016-06-09 MED ORDER — ATORVASTATIN CALCIUM 20 MG PO TABS: 20.0000 mg | ORAL_TABLET | Freq: Every day | ORAL | 0 refills | Status: DC

## 2016-06-09 MED ORDER — APIXABAN 5 MG PO TABS: 5.0000 mg | ORAL_TABLET | Freq: Two times a day (BID) | ORAL | 0 refills | Status: DC

## 2016-06-09 MED FILL — ATORVASTATIN 20 MG TABLET: 20 | 30 days supply | Qty: 30 | Fill #0

## 2016-06-09 MED FILL — ELIQUIS 5 MG TABLET: 5 | 30 days supply | Qty: 60 | Fill #0

## 2016-06-09 NOTE — Telephone Encounter (Signed)
Patient refill requdst

## 2016-06-09 NOTE — Telephone Encounter (Signed)
Patient medication request.

## 2016-06-10 ENCOUNTER — Ambulatory Visit (HOSPITAL_COMMUNITY)
Admission: RE | Admit: 2016-06-10 | Discharge: 2016-06-10 | Disposition: A | Discharge status: Home / Self Care | Payer: Medicare Other | Source: Ambulatory Visit | Attending: Cardiology | Admitting: Cardiology

## 2016-06-10 DIAGNOSIS — I509 Heart failure, unspecified: Secondary | ICD-10-CM

## 2016-06-10 LAB — BASIC METABOLIC PANEL
ANION GAP: 9 (ref 5–15)
BUN: 38 mg/dL — ABNORMAL HIGH (ref 6–20)
CALCIUM: 9.6 mg/dL (ref 8.9–10.3)
CO2: 20 mmol/L — ABNORMAL LOW (ref 22–32)
CREATININE: 1.83 mg/dL — AB (ref 0.44–1.00)
Chloride: 106 mmol/L (ref 101–111)
GFR, EST AFRICAN AMERICAN: 32 mL/min — AB (ref >60)
GFR, EST NON AFRICAN AMERICAN: 27 mL/min — AB (ref >60)
Glucose, Bld: 119 mg/dL — ABNORMAL HIGH (ref 65–99)
Potassium: 5.4 mmol/L — ABNORMAL HIGH (ref 3.5–5.1)
SODIUM: 135 mmol/L (ref 135–145)

## 2016-06-13 ENCOUNTER — Ambulatory Visit (HOSPITAL_COMMUNITY)
Admission: RE | Admit: 2016-06-13 | Discharge: 2016-06-13 | Disposition: A | Discharge status: Home / Self Care | Payer: Medicare Other | Source: Ambulatory Visit | Attending: Radiation Oncology | Admitting: Radiation Oncology

## 2016-06-13 DIAGNOSIS — I10 Essential (primary) hypertension: Secondary | ICD-10-CM | POA: Diagnosis not present

## 2016-06-13 DIAGNOSIS — R1319 Other dysphagia: Secondary | ICD-10-CM | POA: Diagnosis not present

## 2016-06-13 DIAGNOSIS — R1314 Dysphagia, pharyngoesophageal phase: Secondary | ICD-10-CM | POA: Diagnosis not present

## 2016-06-13 DIAGNOSIS — G935 Compression of brain: Secondary | ICD-10-CM | POA: Diagnosis not present

## 2016-06-13 MED FILL — LOSARTAN POTASSIUM 50 MG TA: 50 | 30 days supply | Qty: 30 | Fill #0

## 2016-06-13 NOTE — Progress Notes (Signed)
Modified Barium Swallow Progress Note  Patient Details  Name: Carrie Watts MRN: [id number] Date of Birth: 01-16-1951  Today's Date: 06/13/2016  Modified Barium Swallow completed.  Full report located under Chart Review in the Imaging Section.  Brief recommendations include the following:  Clinical Impression  Ms. Nogales presented with complaints of a pharyngeal globus sensation, regurgitation, and a hoarse vocal quality. She does not exhibit an oral or pharyngeal dysphagia (no penetration or aspiration), however suspect primary esophageal dysphagia due to appearance of slower transit of bolus through esophagus (MBS does not diagnose below the level of the esophagus). Liquids appeared to aid in the clearance of regular solids during esophageal scan. The barium pill transited without difficulty, although pt complained of globus sensation. Educated pt re: esophageal precautions (follow solids with liquids, stay upright after meals) and advised pt to consider GI evaluation. Recommended regular solids, thin liquids (straws ok), meds whole with liquids. No swallowing treatment necessary at this time.   Swallow Evaluation Recommendations   Recommended Consults: Consider GI evaluation   SLP Diet Recommendations: Regular solids;Thin liquid   Liquid Administration via: Cup;Straw   Medication Administration: Whole meds with liquid   Supervision: Patient able to self feed   Compensations: Slow rate;Small sips/bites;Follow solids with liquid       Oral Care Recommendations: Oral care BID        Houston Siren 06/13/2016,3:31 PM   Orbie Pyo [place] [phone]

## 2016-06-14 ENCOUNTER — Other Ambulatory Visit: Payer: Self-pay | Admitting: Family Medicine

## 2016-06-14 DIAGNOSIS — R29898 Other symptoms and signs involving the musculoskeletal system: Secondary | ICD-10-CM

## 2016-06-14 DIAGNOSIS — R531 Weakness: Secondary | ICD-10-CM

## 2016-06-16 ENCOUNTER — Ambulatory Visit (HOSPITAL_COMMUNITY)
Admission: RE | Admit: 2016-06-16 | Discharge: 2016-06-16 | Disposition: A | Discharge status: Home / Self Care | Payer: Medicare Other | Source: Ambulatory Visit | Attending: Internal Medicine | Admitting: Internal Medicine

## 2016-06-16 VITALS — BP 118/72 | HR 85 | Wt 186.8 lb

## 2016-06-16 DIAGNOSIS — Z79899 Other long term (current) drug therapy: Secondary | ICD-10-CM | POA: Diagnosis not present

## 2016-06-16 DIAGNOSIS — C50112 Malignant neoplasm of central portion of left female breast: Secondary | ICD-10-CM | POA: Diagnosis not present

## 2016-06-16 DIAGNOSIS — Z170 Estrogen receptor positive status [ER+]: Secondary | ICD-10-CM | POA: Diagnosis not present

## 2016-06-16 DIAGNOSIS — I110 Hypertensive heart disease with heart failure: Secondary | ICD-10-CM | POA: Diagnosis not present

## 2016-06-16 DIAGNOSIS — I2692 Saddle embolus of pulmonary artery without acute cor pulmonale: Secondary | ICD-10-CM

## 2016-06-16 DIAGNOSIS — Z7984 Long term (current) use of oral hypoglycemic drugs: Secondary | ICD-10-CM | POA: Insufficient documentation

## 2016-06-16 DIAGNOSIS — C50919 Malignant neoplasm of unspecified site of unspecified female breast: Secondary | ICD-10-CM | POA: Insufficient documentation

## 2016-06-16 DIAGNOSIS — E119 Type 2 diabetes mellitus without complications: Secondary | ICD-10-CM | POA: Insufficient documentation

## 2016-06-16 DIAGNOSIS — I5022 Chronic systolic (congestive) heart failure: Secondary | ICD-10-CM | POA: Insufficient documentation

## 2016-06-16 DIAGNOSIS — I272 Pulmonary hypertension, unspecified: Secondary | ICD-10-CM | POA: Diagnosis not present

## 2016-06-16 DIAGNOSIS — Z7901 Long term (current) use of anticoagulants: Secondary | ICD-10-CM | POA: Diagnosis not present

## 2016-06-16 DIAGNOSIS — Z86711 Personal history of pulmonary embolism: Secondary | ICD-10-CM | POA: Diagnosis not present

## 2016-06-16 DIAGNOSIS — I2510 Atherosclerotic heart disease of native coronary artery without angina pectoris: Secondary | ICD-10-CM | POA: Insufficient documentation

## 2016-06-16 DIAGNOSIS — I959 Hypotension, unspecified: Secondary | ICD-10-CM | POA: Diagnosis not present

## 2016-06-16 LAB — BASIC METABOLIC PANEL
Anion gap: 7 (ref 5–15)
BUN: 19 mg/dL (ref 6–20)
CHLORIDE: 109 mmol/L (ref 101–111)
CO2: 22 mmol/L (ref 22–32)
CREATININE: 1.25 mg/dL — AB (ref 0.44–1.00)
Calcium: 9.2 mg/dL (ref 8.9–10.3)
GFR calc Af Amer: 50 mL/min — ABNORMAL LOW (ref >60)
GFR calc non Af Amer: 43 mL/min — ABNORMAL LOW (ref >60)
GLUCOSE: 77 mg/dL (ref 65–99)
Potassium: 5 mmol/L (ref 3.5–5.1)
SODIUM: 138 mmol/L (ref 135–145)

## 2016-06-16 MED ORDER — RIVAROXABAN 20 MG PO TABS: 20.0000 mg | ORAL_TABLET | Freq: Every day | ORAL | 3 refills | Status: DC

## 2016-06-16 NOTE — Progress Notes (Signed)
Advanced Heart Failure Medication Review by a Pharmacist  Does the patient  feel that his/her medications are working for him/her?  yes  Has the patient been experiencing any side effects to the medications prescribed?  no  Does the patient measure his/her own blood pressure or blood glucose at home?  yes   Does the patient have any problems obtaining medications due to transportation or finances?   no  Understanding of regimen: good Understanding of indications: good Potential of compliance: good Patient understands to avoid NSAIDs. Patient understands to avoid decongestants.  Issues to address at subsequent visits: None   Pharmacist comments: Ms. Depolo is a pleasant 66 yo AAF presenting to advanced HF clinic with her daughter. They stated that she is compliant with her medications. She plans to switch over from Eliquis to Xarelto so that she only has to take 1 tablet a day. They had no other questions or concerns.   Belia Heman, PharmD PGY1 Pharmacy Resident [phone] (Pager) 06/16/2016 2:15 PM  Time with patient: 10 min Preparation and documentation time: 2 min  Total time: 12 min

## 2016-06-16 NOTE — Progress Notes (Signed)
Advanced Heart Failure Clinic Note   Referring Physician: Dr. Jana Hakim Primary Care: Dorena Dew, FNP Primary Cardiologist: Dr. Haroldine Laws   HPI:  Carrie Watts is a 66 y.o. female with a history of HTN, DM, L breast cancer s/p mastectomy and 5 rounds of chemo with cyclophosphamide,adriamycin and Taxol) in [place] and recent admission for PE and DVT at [facility] (04/24/16-05/03/16)  She flew back from [place] 1st or 2nd week of Jan 2018. Came to ER 1/14 with R sided facial droop and difficulty speaking and sob. Workup done in the ED showed a saddle pulmonary emboli. LE venous doppler showed right LE DVT. Her PE could be related to long flight travel vs. Hypercoagulable state secondary to breast cancer. Ruled out for stroke, MRI/MRA brain unremarkable, echo with bubble study stable, carotid duplex nonacute. Started on  on Lovenox, transitioned to apixaban at discharge. Her EF was 55-60% 04/24/16.  Seen oncology clinic 05/11/16. Patient continued to have dyspnea since discharge with intermittent chest pain. Repeat CTA showed resolving clot burden. Mild cardiomegaly. Borderline, improved RIGHT heart strain (RV/LV 0.9, previously 1.2). Repeat echo 2/18 showed EF 40-45% Mild to moderate global reduction in LV systolic function; grade 1 diastolic dysfunction; mild LVH; mild MR; trace TR with mildly elevated pulmonary pressure. Normal RV function. Cath with very mild CAD  She presents today for follow up. Feeling much better after recent medication adjustment. Still as trace edema in the evenings that is gone by the morning when she keeps her feet propped up. Denies orthopnea.  Still getting dizzy at times, but feels like it is more of a room spinning situation. BP has been more normal. Remaining over 100 or 110s.  No bleeding Eliquis. Wants to switch to Xarelto when she runs out this month.   Cath 05/18/16  Mid RCA lesion, 10 %stenosed.  Mid LAD lesion, 40 %stenosed.  Mid Cx lesion, 10  %stenosed.  The left ventricular ejection fraction is 40-45% by visual estimate.  There is mild left ventricular systolic dysfunction.  Vasospasm noted in the right radial artery.  LV end diastolic pressure is normal, 11 mm Hg.    Past Medical History:  Diagnosis Date  . Angina of effort ([place])   . Atrial fibrillation ([place])   . Cancer (HCC)    breast  . CHF (congestive heart failure) ([place])   . Deep venous thrombosis ([place])   . Diabetes mellitus without complication ([place])   . Hypercholesterolemia   . Hypertension   . Pulmonary embolism ([place])   . Renal disorder   . Stroke [facility])     Current Outpatient Prescriptions  Medication Sig Dispense Refill  . anastrozole (ARIMIDEX) 1 MG tablet Take 1 tablet (1 mg total) by mouth daily. 30 tablet 1  . apixaban (ELIQUIS) 5 MG TABS tablet Take 1 tablet (5 mg total) by mouth 2 (two) times daily. 60 tablet 0  . atorvastatin (LIPITOR) 20 MG tablet Take 1 tablet (20 mg total) by mouth at bedtime. 30 tablet 0  . carvedilol (COREG) 6.25 MG tablet Take 1 tablet (6.25 mg total) by mouth 2 (two) times daily with a meal. 60 tablet 3  . folic acid (FOLVITE) 409 MCG tablet Take 800 mcg by mouth every morning.     [name] glipiZIDE (GLUCOTROL XL) 5 MG 24 hr tablet Take 5 mg by mouth daily as needed. For blood sugar >250    . glucose monitoring kit (FREESTYLE) monitoring kit 1 each by Does not apply route 4 (four) times daily -  after meals and at bedtime. 1 month Diabetic Testing Supplies for QAC-QHS accuchecks.  Diagnosis E11.65. Any brand OK 1 each 1  . losartan (COZAAR) 50 MG tablet Take 1 tablet (50 mg total) by mouth daily. 30 tablet 1  . metFORMIN (GLUCOPHAGE) 1000 MG tablet Take 1,000 mg by mouth 2 (two) times daily.  0  . Multiple Vitamin (MULTIVITAMIN WITH MINERALS) TABS tablet Take 1 tablet by mouth every morning.     [name] omeprazole (PRILOSEC) 20 MG capsule Take 20 mg by mouth every morning.     . ondansetron (ZOFRAN) 4 MG tablet Take 1 tablet (4 mg  total) by mouth every 8 (eight) hours as needed for nausea or vomiting. 30 tablet 1  . ranitidine (ZANTAC) 300 MG tablet Take 300 mg by mouth at bedtime.    . feeding supplement, GLUCERNA SHAKE, (GLUCERNA SHAKE) LIQD Take 237 mLs by mouth 3 (three) times daily between meals. (Patient not taking: Reported on 06/16/2016) 30 Can 0   No current facility-administered medications for this encounter.     Allergies  Allergen Reactions  . Dust Mite Extract       Social History   Social History  . Marital status: Divorced    Spouse name: N/A  . Number of children: N/A  . Years of education: N/A   Occupational History  . Not on file.   Social History Main Topics  . Smoking status: Never Smoker  . Smokeless tobacco: Never Used  . Alcohol use No  . Drug use: No  . Sexual activity: Not on file   Other Topics Concern  . Not on file   Social History Narrative  . No narrative on file      Family History  Problem Relation Age of Onset  . Heart attack Mother   . Clotting disorder Neg Hx     Vitals:   06/16/16 1403  BP: 118/72  Pulse: 85  SpO2: 100%  Weight: 186 lb 12.8 oz (84.7 kg)   Wt Readings from Last 3 Encounters:  06/16/16 186 lb 12.8 oz (84.7 kg)  06/07/16 184 lb 3.2 oz (83.6 kg)  06/02/16 183 lb 4 oz (83.1 kg)     PHYSICAL EXAM: General:  Well appearing. NAD.  HEENT:Normal x for alopecia Neck: Supple. No JVD. Carotids 2+ bilat; no bruits. No thyromegaly or nodule noted.  Cor: PMI nondisplaced. RRR. No M/G/R noted.  Lungs: CTAB, normal effort.  Abdomen: soft, NT, ND, no HSM. No bruits or masses. +BS  Extremities: no cyanosis, clubbing, rash, no peripheral edema. Neuro: alert & oriented x 3, cranial nerves grossly intact. moves all 4 extremities w/o difficulty. Affect pleasant.   ASSESSMENT & PLAN:  1. Chronic systolic HF    --EF 18-29% in 2/18. Cath with minimal CAD. ? Related to adriamycin versus stress related to massive PE    --NYHA III.     -- Volume  status stable . No longer orthostatic.     -- Continue coreg 6.25 mg BID.     -- Continue losartan    -- Repeat BMET today.    -- Plan RTC with Echo at end of May/Early June.  2. Recent saddle PE/DVT    -- Pt wishes to transition to Xarelto. Will write script for 20 mg daily. Instructed to transition from Eliquis to Xarelto at time of next dose (i.e, if Eliquis taken in AM, would start Xarelto that PM)    -- Will discuss repeat CT at next visit in May.  After that would follow with VQ scans.  3. Breast CA    -- Per Oncology. Has complete neo-adjuvant chemo including Adriamycin    -- Last echo as above. Repeat Echo in May/June.  4. Hypotension    -- Much improved. Continue current medications. Take 20 mg lasix only as needed. No more than 1-2 times weekly.   Overall stable with medication changes at last visit. Will plan follow up with repeat Echo 2-3 months.   Shirley Friar, PA-C  2:15 PM  Total time spent > 25 minutes. Over half that time spent discussing the above, including transition from Eliquis to Xarelto, HF symptoms, and sliding scale diuretics.

## 2016-06-16 NOTE — Patient Instructions (Signed)
Routine lab work today. Will notify you of abnormal results, otherwise no news is good news!  Xarelto 20 mg has been sent to your pharmacy. Take this once daily once you have finished your Eliquis.  Follow up with echocardiogram and appointment with Dr. Haroldine Laws at the end of May.  Do the following things EVERYDAY: 1) Weigh yourself in the morning before breakfast. Write it down and keep it in a log. 2) Take your medicines as prescribed 3) Eat low salt foods-Limit salt (sodium) to 2000 mg per day.  4) Stay as active as you can everyday 5) Limit all fluids for the day to less than 2 liters

## 2016-06-23 ENCOUNTER — Telehealth: Payer: Self-pay

## 2016-06-23 NOTE — Telephone Encounter (Signed)
Called, no answer. Left message advising that I have sent the referral back in and if she does not receive a call by next week to call me back and left call back number. Thanks!

## 2016-06-27 ENCOUNTER — Other Ambulatory Visit: Payer: Self-pay | Admitting: Adult Health

## 2016-06-27 ENCOUNTER — Ambulatory Visit: Payer: Medicaid Other

## 2016-06-27 ENCOUNTER — Ambulatory Visit: Payer: Medicare Other | Attending: Radiation Oncology

## 2016-06-27 DIAGNOSIS — R1310 Dysphagia, unspecified: Secondary | ICD-10-CM | POA: Insufficient documentation

## 2016-06-27 DIAGNOSIS — C779 Secondary and unspecified malignant neoplasm of lymph node, unspecified: Principal | ICD-10-CM

## 2016-06-27 DIAGNOSIS — C50912 Malignant neoplasm of unspecified site of left female breast: Secondary | ICD-10-CM

## 2016-06-27 NOTE — Therapy (Signed)
[facility] [address] [place], [place], [address] Phone: [phone]   Fax:  [phone]  Patient Details  Name: Dynasia Kercheval MRN: [id number] Date of Birth: 04/16/1950 Referring Provider:  Eppie Gibson, MD  Encounter Date: 06/27/2016    SPEECH SCREEN  Pt was seen for speech/swallow screening following her modified barium swallow exam (MBSS) on 06-13-16, in which regular diet and thin liquids were recommended, and no formal swallowing treatment was recommended.  Today, SLP reviewed results and recommendations from MBSS, and pt return demonstrated.   Pt appears very unsteady on her feet and has short stride length. She may well benefit from skilled physcial therapy either HH or OP basis.  If pt were deemed homebound, then home health PT would be appropriate. If pt is not deemed homebound, then referral for outpatient PT would be necessary. This could be done by sending a referral via fax for PT to [facility] at [phone].   [facility] ,[place], [place] [phone]  06/27/2016, 1:40 PM  [facility] [address] [place], [place], [address] Phone: [phone]   Fax:  [phone]

## 2016-06-28 ENCOUNTER — Ambulatory Visit (HOSPITAL_BASED_OUTPATIENT_CLINIC_OR_DEPARTMENT_OTHER): Payer: Medicare Other | Admitting: Oncology

## 2016-06-28 ENCOUNTER — Other Ambulatory Visit (HOSPITAL_BASED_OUTPATIENT_CLINIC_OR_DEPARTMENT_OTHER): Payer: Medicare Other

## 2016-06-28 VITALS — BP 125/76 | HR 70 | Temp 97.8°F | Resp 18 | Ht 62.0 in | Wt 189.2 lb

## 2016-06-28 DIAGNOSIS — C773 Secondary and unspecified malignant neoplasm of axilla and upper limb lymph nodes: Secondary | ICD-10-CM

## 2016-06-28 DIAGNOSIS — I2699 Other pulmonary embolism without acute cor pulmonale: Secondary | ICD-10-CM | POA: Diagnosis not present

## 2016-06-28 DIAGNOSIS — C50812 Malignant neoplasm of overlapping sites of left female breast: Secondary | ICD-10-CM

## 2016-06-28 DIAGNOSIS — I639 Cerebral infarction, unspecified: Secondary | ICD-10-CM | POA: Diagnosis not present

## 2016-06-28 DIAGNOSIS — C779 Secondary and unspecified malignant neoplasm of lymph node, unspecified: Principal | ICD-10-CM

## 2016-06-28 DIAGNOSIS — C50912 Malignant neoplasm of unspecified site of left female breast: Secondary | ICD-10-CM

## 2016-06-28 DIAGNOSIS — Z170 Estrogen receptor positive status [ER+]: Secondary | ICD-10-CM | POA: Diagnosis not present

## 2016-06-28 DIAGNOSIS — C50112 Malignant neoplasm of central portion of left female breast: Secondary | ICD-10-CM

## 2016-06-28 DIAGNOSIS — I82401 Acute embolism and thrombosis of unspecified deep veins of right lower extremity: Secondary | ICD-10-CM

## 2016-06-28 LAB — CBC WITH DIFFERENTIAL/PLATELET
BASO%: 0.2 % (ref 0.0–2.0)
Basophils Absolute: 0 10*3/uL (ref 0.0–0.1)
EOS%: 1.9 % (ref 0.0–7.0)
Eosinophils Absolute: 0.1 10*3/uL (ref 0.0–0.5)
HEMATOCRIT: 30.3 % — AB (ref 34.8–46.6)
HGB: 9.8 g/dL — ABNORMAL LOW (ref 11.6–15.9)
LYMPH#: 1.9 10*3/uL (ref 0.9–3.3)
LYMPH%: 36 % (ref 14.0–49.7)
MCH: 30.2 pg (ref 25.1–34.0)
MCHC: 32.3 g/dL (ref 31.5–36.0)
MCV: 93.2 fL (ref 79.5–101.0)
MONO#: 0.3 10*3/uL (ref 0.1–0.9)
MONO%: 4.7 % (ref 0.0–14.0)
NEUT%: 57.2 % (ref 38.4–76.8)
NEUTROS ABS: 3 10*3/uL (ref 1.5–6.5)
PLATELETS: 255 10*3/uL (ref 145–400)
RBC: 3.25 10*6/uL — ABNORMAL LOW (ref 3.70–5.45)
RDW: 15.5 % — ABNORMAL HIGH (ref 11.2–14.5)
WBC: 5.3 10*3/uL (ref 3.9–10.3)

## 2016-06-28 LAB — COMPREHENSIVE METABOLIC PANEL
ALBUMIN: 3.4 g/dL — AB (ref 3.5–5.0)
ALT: 17 U/L (ref 0–55)
ANION GAP: 10 meq/L (ref 3–11)
AST: 18 U/L (ref 5–34)
Alkaline Phosphatase: 57 U/L (ref 40–150)
BILIRUBIN TOTAL: 0.34 mg/dL (ref 0.20–1.20)
BUN: 19.1 mg/dL (ref 7.0–26.0)
CALCIUM: 9.6 mg/dL (ref 8.4–10.4)
CO2: 22 mEq/L (ref 22–29)
CREATININE: 1 mg/dL (ref 0.6–1.1)
Chloride: 111 mEq/L — ABNORMAL HIGH (ref 98–109)
EGFR: 66 mL/min/{1.73_m2} — ABNORMAL LOW (ref >90)
Glucose: 117 mg/dl (ref 70–140)
Potassium: 4.1 mEq/L (ref 3.5–5.1)
Sodium: 143 mEq/L (ref 136–145)
TOTAL PROTEIN: 6.7 g/dL (ref 6.4–8.3)

## 2016-06-28 NOTE — Progress Notes (Signed)
[place]  [phone] Fax:(336) [phone]     ID: Carrie Watts DOB: 11/03/1950  MR#: [id number]  ZTI#:[id number]  Patient Care Team: Dorena Dew, FNP as PCP - General (Family Medicine) Chauncey Cruel, MD as Consulting Physician (Oncology) Chauncey Cruel, MD OTHER MD:  CHIEF COMPLAINT: Estrogen receptor positive breast cancer  CURRENT TREATMENT: Anastrozole   BREAST CANCER HISTORY: From the original intake note:  The patient tells me she herself palpated a mass in her left breast and brought it to medical attention. She underwent mammography, ultrasonography, and biopsy and then proceeded to left mastectomy and axillary lymph node dissection at [facility] in [place]. The pathology from this procedure (SFS [id number]) describes a 2.1 cm tumor, grade 2, mixed infiltrating lobular and ductal, involving multiple quadrants. The deep margin was clear. 2 of 15 lymph nodes were involved.  The patient was then treated with what I her report seems to have been doxorubicin and cyclophosphamide given every 3 weeks 4, beginning October and completed December, at which point she was started on what sounds like paclitaxel weekly. According to her account she received 1 dose of paclitaxel 04/06/2016. At that time she moved to this area to be closer to her daughter and to receive further treatment.  Of note, on 04/24/2016 she presented to the emergency room with chest pain and shortness of breath, with a CT angiogram on that date showing a saddle embolus with bilateral lobe wall and segmental pulmonary emboli. Doppler ultrasonography the next day documented a right lower extremity deep venous thrombosis. The patient was started on Lovenox and transitioned to apixaban   Her subsequent history is as detailed below  INTERVAL HISTORY: Carrie Watts returns today for follow-up of her estrogen receptor positive breast cancer accompanied by her daughter Carrie Watts.  The patient continues on anastrozole, with good tolerance. Hot flashes and vaginal dryness are not a major issue. She never developed the arthralgias or myalgias that many patients can experience on this medication. She obtains it at a good price.  Her daughter make sure that we got all the papers from her earlier treatment and that is useful. However what we really want to know does not seem to be in these papers which is whether they ever sent and FISH study on her original tumor, it was equivocal by immunohistochemistry for HER-2 amplification  She continues to work on the residuals of her stroke. She had a swallowing study which she finds was helpful and she is now eating and drinking better, without choking or coughing, and getting good nourishment. She is also walking much better although she is not using a cane or walker all the time and this is a real concern.   REVIEW OF SYSTEMS: The patient is tolerating her apixaban without any unusual bleeding other than minimal epistaxis if she blows her nose. Aside from these issues a detailed review of systems today was stable  PAST MEDICAL HISTORY: Past Medical History:  Diagnosis Date  . Angina of effort ([place])   . Atrial fibrillation ([place])   . Cancer (HCC)    breast  . CHF (congestive heart failure) ([place])   . Deep venous thrombosis ([place])   . Diabetes mellitus without complication ([place])   . Hypercholesterolemia   . Hypertension   . Pulmonary embolism ([place])   . Renal disorder   . Stroke [facility])     PAST SURGICAL HISTORY: Past Surgical History:  Procedure Laterality Date  . BREAST SURGERY    .  COLONOSCOPY N/A 05/16/2016   Procedure: COLONOSCOPY;  Surgeon: [name], MD;  Location: [place];  Service: Endoscopy;  Laterality: N/A;  . ESOPHAGOGASTRODUODENOSCOPY N/A 05/16/2016   Procedure: ESOPHAGOGASTRODUODENOSCOPY (EGD);  Surgeon: [name], MD;  Location: [place];  Service: Endoscopy;  Laterality: N/A;  . LEFT HEART CATH  AND CORONARY ANGIOGRAPHY N/A 05/18/2016   Procedure: Left Heart Cath and Coronary Angiography;  Surgeon: Jettie Booze, MD;  Location: [place] CV LAB;  Service: Cardiovascular;  Laterality: N/A;  . PARTIAL HYSTERECTOMY      FAMILY HISTORY Family History  Problem Relation Age of Onset  . Heart attack Mother   . Clotting disorder Neg Hx   The patient's father died from colorectal cancer at age 78. The patient's mother died at age 73 from heart disease. The patient has 3 brothers, 1 sister. The sister was diagnosed with breast cancer in her late 71s. The patient has a niece diagnosed with breast cancer at age 49.  GYNECOLOGIC HISTORY:  No LMP recorded. Patient has had a hysterectomy. Menarche age 70, first live birth age 62. The patient is GX P5. She status post menopause but does not recall the date. She did not take hormone replacement. She never used oral contraceptives.  SOCIAL HISTORY:  The patient used to do gardening. She is originally from [place]. She is divorced. She is currently living with her daughter Carrie Watts hearing [place]. [name] is an Therapist, sports. The patient's other children are either in [place] or [place]. The patient has 8 grandchildren. She is a Nurse, learning disability.    ADVANCED DIRECTIVES: In place   HEALTH MAINTENANCE: Social History  Substance Use Topics  . Smoking status: Never Smoker  . Smokeless tobacco: Never Used  . Alcohol use No     Colonoscopy: Never  PAP:  Bone density: Never   Allergies  Allergen Reactions  . Dust Mite Extract     Current Outpatient Prescriptions  Medication Sig Dispense Refill  . anastrozole (ARIMIDEX) 1 MG tablet Take 1 tablet (1 mg total) by mouth daily. 30 tablet 1  . apixaban (ELIQUIS) 5 MG TABS tablet Take 1 tablet (5 mg total) by mouth 2 (two) times daily. 60 tablet 0  . atorvastatin (LIPITOR) 20 MG tablet Take 1 tablet (20 mg total) by mouth at bedtime. 30 tablet 0  . carvedilol (COREG) 6.25 MG tablet Take 1 tablet  (6.25 mg total) by mouth 2 (two) times daily with a meal. 60 tablet 3  . feeding supplement, GLUCERNA SHAKE, (GLUCERNA SHAKE) LIQD Take 237 mLs by mouth 3 (three) times daily between meals. (Patient not taking: Reported on 05/19/7865) 30 Can 0  . folic acid (FOLVITE) 672 MCG tablet Take 800 mcg by mouth every morning.     [name] glipiZIDE (GLUCOTROL XL) 5 MG 24 hr tablet Take 5 mg by mouth daily as needed. For blood sugar >250    . glucose monitoring kit (FREESTYLE) monitoring kit 1 each by Does not apply route 4 (four) times daily - after meals and at bedtime. 1 month Diabetic Testing Supplies for QAC-QHS accuchecks.  Diagnosis E11.65. Any brand OK 1 each 1  . losartan (COZAAR) 50 MG tablet Take 1 tablet (50 mg total) by mouth daily. 30 tablet 1  . metFORMIN (GLUCOPHAGE) 1000 MG tablet Take 1,000 mg by mouth 2 (two) times daily.  0  . Multiple Vitamin (MULTIVITAMIN WITH MINERALS) TABS tablet Take 1 tablet by mouth every morning.     [name] omeprazole (PRILOSEC) 20  MG capsule Take 20 mg by mouth every morning.     . ondansetron (ZOFRAN) 4 MG tablet Take 1 tablet (4 mg total) by mouth every 8 (eight) hours as needed for nausea or vomiting. 30 tablet 1  . ranitidine (ZANTAC) 300 MG tablet Take 300 mg by mouth at bedtime.    . rivaroxaban (XARELTO) 20 MG TABS tablet Take 1 tablet (20 mg total) by mouth daily with supper. 30 tablet 3   No current facility-administered medications for this visit.     OBJECTIVE: Middle-aged African-American woman Who appears stated age; she is not in a wheelchair today Vitals:   06/28/16 1345  BP: 125/76  Pulse: 70  Resp: 18  Temp: 97.8 F (36.6 C)     Body mass index is 34.61 kg/m.    ECOG FS:2 - Symptomatic, <50% confined to bed  Sclerae unicteric, pupils round and equal Oropharynx clear and moist--poor dentition No cervical or supraclavicular adenopathy Lungs no rales or rhonchi Heart regular rate and rhythm Abd soft, obese, nontender, positive bowel sounds MSK no  focal spinal tenderness, no upper extremity lymphedema Neuro: well oriented, positive affect; was able to climb onto the examination table with minimal assistance Breasts: The right breast is benign. The left breast has undergone mastectomy. There is no evidence of local recurrence. Both axillae are benign.   LAB RESULTS:  CMP     Component Value Date/Time   NA 143 06/28/2016 1231   K 4.1 06/28/2016 1231   CL 109 06/16/2016 1454   CO2 22 06/28/2016 1231   GLUCOSE 117 06/28/2016 1231   BUN 19.1 06/28/2016 1231   CREATININE 1.0 06/28/2016 1231   CALCIUM 9.6 06/28/2016 1231   PROT 6.7 06/28/2016 1231   ALBUMIN 3.4 (L) 06/28/2016 1231   AST 18 06/28/2016 1231   ALT 17 06/28/2016 1231   ALKPHOS 57 06/28/2016 1231   BILITOT 0.34 06/28/2016 1231   GFRNONAA 43 (L) 06/16/2016 1454   GFRNONAA 23 (L) 06/03/2016 1119   GFRAA 50 (L) 06/16/2016 1454   GFRAA 26 (L) 06/03/2016 1119    INo results found for: SPEP, UPEP  Lab Results  Component Value Date   WBC 5.3 06/28/2016   NEUTROABS 3.0 06/28/2016   HGB 9.8 (L) 06/28/2016   HCT 30.3 (L) 06/28/2016   MCV 93.2 06/28/2016   PLT 255 06/28/2016      Chemistry      Component Value Date/Time   NA 143 06/28/2016 1231   K 4.1 06/28/2016 1231   CL 109 06/16/2016 1454   CO2 22 06/28/2016 1231   BUN 19.1 06/28/2016 1231   CREATININE 1.0 06/28/2016 1231      Component Value Date/Time   CALCIUM 9.6 06/28/2016 1231   ALKPHOS 57 06/28/2016 1231   AST 18 06/28/2016 1231   ALT 17 06/28/2016 1231   BILITOT 0.34 06/28/2016 1231       No results found for: LABCA2  No components found for: LABCA125  No results for input(s): INR in the last 168 hours.  Urinalysis    Component Value Date/Time   COLORURINE YELLOW 05/27/2016 0853   APPEARANCEUR CLEAR 05/27/2016 0853   LABSPEC 1.008 05/27/2016 0853   PHURINE 5.0 05/27/2016 0853   GLUCOSEU NEGATIVE 05/27/2016 0853   HGBUR MODERATE (A) 05/27/2016 0853   BILIRUBINUR NEGATIVE  05/27/2016 0853   KETONESUR NEGATIVE 05/27/2016 0853   PROTEINUR NEGATIVE 05/27/2016 0853   UROBILINOGEN 0.2 05/23/2016 1348   NITRITE NEGATIVE 05/27/2016 0853   LEUKOCYTESUR SMALL (  A) 05/27/2016 0853     STUDIES: Dg Op Swallowing Func-medicare/speech Path  Result Date: 06/13/2016 Objective Swallowing Evaluation: Type of Study: MBS-Modified Barium Swallow Study Patient Details Name: Kayelyn Lemon MRN: [id number] Date of Birth: 10/24/1950 Today's Date: 06/13/2016 Time: SLP Start Time (ACUTE ONLY): 0814-SLP Stop Time (ACUTE ONLY): 0835 SLP Time Calculation (min) (ACUTE ONLY): 21 min Past Medical History: Past Medical History: Diagnosis Date . Angina of effort (HCC)  . Atrial fibrillation (HCC)  . Cancer (HCC)   breast . CHF (congestive heart failure) (HCC)  . Deep venous thrombosis (HCC)  . Diabetes mellitus without complication (HCC)  . Hypercholesterolemia  . Hypertension  . Pulmonary embolism (HCC)  . Renal disorder  . Stroke [facility])  Past Surgical History: Past Surgical History: Procedure Laterality Date . BREAST SURGERY   . COLONOSCOPY N/A 05/16/2016  Procedure: COLONOSCOPY;  Surgeon: Rachael Fee, MD;  Location: [facility] ENDOSCOPY;  Service: Endoscopy;  Laterality: N/A; . ESOPHAGOGASTRODUODENOSCOPY N/A 05/16/2016  Procedure: ESOPHAGOGASTRODUODENOSCOPY (EGD);  Surgeon: Rachael Fee, MD;  Location: [facility] ENDOSCOPY;  Service: Endoscopy;  Laterality: N/A; . LEFT HEART CATH AND CORONARY ANGIOGRAPHY N/A 05/18/2016  Procedure: Left Heart Cath and Coronary Angiography;  Surgeon: Corky Crafts, MD;  Location: [facility] INVASIVE CV LAB;  Service: Cardiovascular;  Laterality: N/A; . PARTIAL HYSTERECTOMY   HPI: Ptis a 66 y.o.femaleseen for outpatient MBS with complaints of pharyngeal globus sensation, vocal quality changes, regurgitation of food from prior night. PMH:  left breast mass in [place] in 2016 with course of chemotherapy, significant of angina of effort, atrial fibrillation,cancer, CHF, CVA, deep venous thrombosis,  diabetes mellitus, hypercholesterolemia, hypertension, pulmonary embolism, renal disorder, and stroke. BSE 04/24/16 recommended Dys 3, thin liquids  No Data Recorded Assessment / Plan / Recommendation CHL IP CLINICAL IMPRESSIONS 06/13/2016 Clinical Impression Ms. Fazekas presented with complaints of a pharyngeal globus sensation, regurgitation, and a hoarse vocal quality. She does not exhibit an oral or pharyngeal dysphagia (no penetration or aspiration), however suspect primary esophageal dysphagia due to appearance of slower transit of bolus through esophagus (MBS does not diagnose below the level of the esophagus). Liquids appeared to aid in the clearance of regular solids during esophageal scan. The barium pill transited without difficulty, although pt complained of globus sensation. Educated pt re: esophageal precautions (follow solids with liquids, stay upright after meals) and advised pt to consider GI evaluation. Recommended regular solids, thin liquids (straws ok), meds whole with liquids. No swallowing treatment necessary at this time. SLP Visit Diagnosis Dysphagia, pharyngoesophageal phase (R13.14) Attention and concentration deficit following -- Frontal lobe and executive function deficit following -- Impact on safety and function Mild aspiration risk   CHL IP TREATMENT RECOMMENDATION 06/13/2016 Treatment Recommendations No treatment recommended at this time   Prognosis 06/13/2016 Prognosis for Safe Diet Advancement Good Barriers to Reach Goals -- Barriers/Prognosis Comment -- CHL IP DIET RECOMMENDATION 06/13/2016 SLP Diet Recommendations Regular solids;Thin liquid Liquid Administration via Cup;Straw Medication Administration Whole meds with liquid Compensations Slow rate;Small sips/bites;Follow solids with liquid Postural Changes Seated upright at 90 degrees   CHL IP OTHER RECOMMENDATIONS 06/13/2016 Recommended Consults Consider GI evaluation Oral Care Recommendations Oral care BID Other Recommendations --   CHL  IP FOLLOW UP RECOMMENDATIONS 05/02/2016 Follow up Recommendations None   CHL IP FREQUENCY AND DURATION 04/25/2016 Speech Therapy Frequency (ACUTE ONLY) min 2x/week Treatment Duration --      CHL IP ORAL PHASE 06/13/2016 Oral Phase WFL Oral - Pudding Teaspoon -- Oral - Pudding Cup -- Oral - Honey  Teaspoon -- Oral - Honey Cup -- Oral - Nectar Teaspoon -- Oral - Nectar Cup -- Oral - Nectar Straw -- Oral - Thin Teaspoon -- Oral - Thin Cup -- Oral - Thin Straw -- Oral - Puree -- Oral - Mech Soft -- Oral - Regular -- Oral - Multi-Consistency -- Oral - Pill -- Oral Phase - Comment --  CHL IP PHARYNGEAL PHASE 06/13/2016 Pharyngeal Phase WFL Pharyngeal- Pudding Teaspoon -- Pharyngeal -- Pharyngeal- Pudding Cup -- Pharyngeal -- Pharyngeal- Honey Teaspoon -- Pharyngeal -- Pharyngeal- Honey Cup -- Pharyngeal -- Pharyngeal- Nectar Teaspoon -- Pharyngeal -- Pharyngeal- Nectar Cup -- Pharyngeal -- Pharyngeal- Nectar Straw -- Pharyngeal -- Pharyngeal- Thin Teaspoon -- Pharyngeal -- Pharyngeal- Thin Cup (No Data) Pharyngeal -- Pharyngeal- Thin Straw -- Pharyngeal -- Pharyngeal- Puree -- Pharyngeal -- Pharyngeal- Mechanical Soft -- Pharyngeal -- Pharyngeal- Regular WFL Pharyngeal -- Pharyngeal- Multi-consistency -- Pharyngeal -- Pharyngeal- Pill WFL Pharyngeal -- Pharyngeal Comment --  No flowsheet data found. CHL IP GO 06/13/2016 Functional Assessment Tool Used skilled clinical judgement Functional Limitations Swallowing Swallow Current Status [phone]) [place] Swallow Goal Status (Q0347) [facility] Swallow Discharge Status (Q2595) [place] Motor Speech Current Status [phone]) (None) Motor Speech Goal Status [phone]) (None) Motor Speech Goal Status (R5188) (None) Spoken Language Comprehension Current Status [phone]) (None) Spoken Language Comprehension Goal Status (Y3016) (None) Spoken Language Comprehension Discharge Status [phone]) (None) Spoken Language Expression Current Status [phone]) (None) Spoken Language Expression Goal Status [phone]) (None) Spoken Language  Expression Discharge Status [phone]) (None) Attention Current Status (W2376) (None) Attention Goal Status (E8315) (None) Attention Discharge Status [phone]) (None) Memory Current Status (W7371) (None) Memory Goal Status (G6269) (None) Memory Discharge Status (S8546) (None) Voice Current Status (E7035) (None) Voice Goal Status (K0938) (None) Voice Discharge Status (H8299) (None) Other Speech-Language Pathology Functional Limitation Current Status (B7169) (None) Other Speech-Language Pathology Functional Limitation Goal Status (C7893) (None) Other Speech-Language Pathology Functional Limitation Discharge Status [phone]) (None) [place] Siren 06/13/2016, 3:24 PM  Orbie Pyo Colvin Caroli.Ed Engineer, agricultural [phone]           CLINICAL DATA:  Dysphagia EXAM: MODIFIED BARIUM SWALLOW TECHNIQUE: Different consistencies of barium were administered orally to the patient by the Speech Pathologist. Imaging of the pharynx was performed in the lateral projection. FLUOROSCOPY TIME:  Fluoroscopy Time:  57 seconds Number of Acquired Spot Images: 0 COMPARISON:  None. FINDINGS: Thin liquid- within normal limits Cracker-the cracker cleared the pharynx normally. However, it was held up in the upper esophagus, clearing with an additional swallow of thin liquids. Barium tablet -  within normal limits IMPRESSION: The cracker and barium cleared the pharynx normally. However, it was held up in the upper esophagus, clearing with an additional swallow with thin liquids. Please refer to the Speech Pathologists report for complete details and recommendations. Electronically Signed   By: Dorise Bullion III M.D   On: 06/13/2016 09:02    ELIGIBLE FOR AVAILABLE RESEARCH PROTOCOL: no  ASSESSMENT: 66 y.o. [place] woman originally from [place], status post left mastectomy and sentinel lymph node sampling 07/29/2015 for a pT2 pN1, stage IIB invasive breast cancer, mixed ductal and lobular, strongly estrogen and progesterone receptor positive,  HER-2 equivocal  (1) she received adjuvant chemotherapy consisting (most likely) of cyclophosphamide and doxorubicin 4 given between October and December 2017, then started paclitaxel 04/06/2016, discontinued after one dose because of neuropathy and other complications  (2) considered adjuvant radiation, decided against it due to delay  (3) anastrozole started 05/11/2016  (4) pulmonary embolus and right lower extremity  deep vein thrombosis diagnosed 04/24/2016,  (a) status post enoxaparin 04/24/2016 through 05/03/2016  (b) transitioned to apixaban 05/03/2016  (5) status post stroke with moderate residual  PLAN: Lynnmarie is tolerating the anastrozole remarkably well and the plan will be to continue that to a minimum of 5 years.  We have not been able to obtain a copy of the fish from her original biopsy if indeed it was sent. The daughter is going to call the physician. We will send a letter if that is not effective.  Webb Silversmith has also tolerating the apixaban with no bleeding complications other than minimal epistaxis.  It is gratifying to see how much progress she is making as far as her stroke symptoms are concerned. She is hoping to be able to get back to her home. If she continues to improve that might become possible  For now however she will remain under her daughter's care. The one thing that I have strongly emphasized is that she needs to use something to help her balance so she does not fall and break a hip or have other problems needing surgery, which would be a major complication given her situation  She will see me again in 3 months. They know to call for any problems that may develop before that visit.    Chauncey Cruel, MD   06/28/2016 2:05 PM Medical Oncology and Hematology [facility] [address] [place],  [address] Tel. [phone]    Fax. [phone]

## 2016-06-29 ENCOUNTER — Ambulatory Visit: Payer: Medicaid Other | Admitting: Family Medicine

## 2016-06-30 ENCOUNTER — Ambulatory Visit (INDEPENDENT_AMBULATORY_CARE_PROVIDER_SITE_OTHER): Payer: Medicare Other | Admitting: Family Medicine

## 2016-06-30 ENCOUNTER — Encounter: Payer: Self-pay | Admitting: Family Medicine

## 2016-06-30 VITALS — BP 118/70 | HR 65 | Temp 97.8°F | Resp 18 | Ht 62.0 in | Wt 187.0 lb

## 2016-06-30 DIAGNOSIS — R42 Dizziness and giddiness: Secondary | ICD-10-CM | POA: Diagnosis not present

## 2016-06-30 DIAGNOSIS — I639 Cerebral infarction, unspecified: Secondary | ICD-10-CM

## 2016-06-30 DIAGNOSIS — IMO0002 Reserved for concepts with insufficient information to code with codable children: Secondary | ICD-10-CM

## 2016-06-30 DIAGNOSIS — N179 Acute kidney failure, unspecified: Secondary | ICD-10-CM | POA: Diagnosis not present

## 2016-06-30 DIAGNOSIS — H9190 Unspecified hearing loss, unspecified ear: Secondary | ICD-10-CM | POA: Diagnosis not present

## 2016-06-30 DIAGNOSIS — I69398 Other sequelae of cerebral infarction: Secondary | ICD-10-CM

## 2016-06-30 DIAGNOSIS — I5042 Chronic combined systolic (congestive) and diastolic (congestive) heart failure: Secondary | ICD-10-CM | POA: Diagnosis not present

## 2016-06-30 DIAGNOSIS — E1129 Type 2 diabetes mellitus with other diabetic kidney complication: Secondary | ICD-10-CM | POA: Diagnosis not present

## 2016-06-30 DIAGNOSIS — M26622 Arthralgia of left temporomandibular joint: Secondary | ICD-10-CM

## 2016-06-30 DIAGNOSIS — E669 Obesity, unspecified: Secondary | ICD-10-CM | POA: Diagnosis not present

## 2016-06-30 DIAGNOSIS — I10 Essential (primary) hypertension: Secondary | ICD-10-CM | POA: Diagnosis not present

## 2016-06-30 DIAGNOSIS — I2699 Other pulmonary embolism without acute cor pulmonale: Secondary | ICD-10-CM | POA: Diagnosis not present

## 2016-06-30 DIAGNOSIS — R531 Weakness: Secondary | ICD-10-CM | POA: Diagnosis not present

## 2016-06-30 DIAGNOSIS — C50919 Malignant neoplasm of unspecified site of unspecified female breast: Secondary | ICD-10-CM | POA: Diagnosis not present

## 2016-06-30 DIAGNOSIS — R300 Dysuria: Secondary | ICD-10-CM | POA: Diagnosis not present

## 2016-06-30 DIAGNOSIS — N183 Chronic kidney disease, stage 3 (moderate): Secondary | ICD-10-CM | POA: Diagnosis not present

## 2016-06-30 DIAGNOSIS — N390 Urinary tract infection, site not specified: Secondary | ICD-10-CM | POA: Diagnosis not present

## 2016-06-30 MED ORDER — MELOXICAM 7.5 MG PO TABS: 7.5000 mg | ORAL_TABLET | Freq: Two times a day (BID) | ORAL | 0 refills | Status: DC | PRN

## 2016-06-30 MED FILL — MELOXICAM 7.5 MG TABLET: 7.5 | 15 days supply | Qty: 30 | Fill #0

## 2016-06-30 NOTE — Patient Instructions (Signed)
Purchase an over the counter oral appliance to prevent teeth grinding.  For pain take Meloxicam 7.5 mg once to twice daily for jaw pain.   I have placed both referrals for physical therapy and vestibular rehabilitation.  Keep follow-up with Danielle Rankin FNP   Jaw Dislocation A jaw dislocation is when the joint that connects the jaw bones (temporomandibular joint) moves out of place. This can be very painful. Your jaw must be moved back into place by your doctor (manual reduction). Follow these instructions at home: Injury care   If you have a jaw bandage (Barton bandage) or a neck brace, wear it as told by your doctor.  If directed, put ice on the injured area:  Put ice in a plastic bag.  Place a towel between your skin and the bag.  Leave the ice on for 20 minutes, 2-3 times per day.  Take over-the-counter and prescription medicines only as told by your doctor.  Rest your jaw as told by your doctor.  Avoid activities that are like the one that caused your injury.  Do not open your mouth wide until you doctor says that you can.  When you sneeze or yawn, put a hand under your jaw. This will keep your mouth from opening too wide.  Follow instructions from your doctor about what you can eat and drink while your jaw is healing. Instructions may include:  Eating soft foods.  Eating liquid food.  Eating food that has been cut into small pieces.  Keep all follow-up visits as told by your doctor. This is important. Contact a doctor if:  You have pain that gets worse, and medicines do not help your pain. Get help right away if:  Your jaw becomes dislocated again.  You have trouble breathing. This information is not intended to replace advice given to you by your health care provider. Make sure you discuss any questions you have with your health care provider. Document Released: 12/08/2010 Document Revised: 09/03/2015 Document Reviewed: 08/25/2014 Elsevier Interactive  Patient Education  2017 [employer].

## 2016-06-30 NOTE — Progress Notes (Signed)
Patient ID: Carrie Watts, female    DOB: [birth year], 66 y.o.   MRN: [id number]  PCP: Dorena Dew, FNP  Chief Complaint  Patient presents with  . Jaw Pain    CHILLS,FEVER, ALWAYS COLD  . Ear Pain    LEFT SIDE  . Referral    NEURO    Subjective:  HPI  Carrie Watts is a 66 y.o. female presents for evaluation of left jaw pain radiating into the left ear. She presents newly to me. Chronic medical problems include: Recent CVA with deficits of weakness, T2DM, BCA, CKD 4, Hypertension, and CHF. Daughter accompanies patient to visit today. Daughter is concern as she never heard back regarding physical therapy evaluation for mother. Patient recently was approved for medicare/medicaid. She recently located here from [place] to live with her daughter.  TMJ Reports over the last few weeks she has experienced worsening pain of the left jaw. She is hearing a clicking sound with opening and closing of there mouth.  When she opens her mouth very wide she experiences significant pain which radiation in the internal left ear. She is uncertain if she grits or grinds her teeth.  She denies any URI symptoms or drainage from ear. No draining of ears or nasal stuffiness recently.  Hearing Loss Bilateral ears when someone is speaking it sounds as if they are in a barrel. Denies any prior hearing problems, although reports persistent dizziness and vertigo symptoms. Her daughter is requesting referrals to vestibular rehab, neurology as she is concerned these symptoms are related to her mother's recent CVA in January.  Social History   Social History  . Marital status: Divorced    Spouse name: N/A  . Number of children: N/A  . Years of education: N/A   Occupational History  . Not on file.   Social History Main Topics  . Smoking status: Never Smoker  . Smokeless tobacco: Never Used  . Alcohol use No  . Drug use: No  . Sexual activity: Not on file   Other Topics Concern  . Not on file    Social History Narrative  . No narrative on file    Family History  Problem Relation Age of Onset  . Heart attack Mother   . Clotting disorder Neg Hx    Review of Systems See HPI Patient Active Problem List   Diagnosis Date Noted  . Chronic kidney disease, stage IV (severe) ([place]) 06/08/2016  . Carcinoma of central portion of left breast in female, estrogen receptor positive ([place]) 06/07/2016  . Generalized weakness 05/27/2016  . Elevated lactic acid level 05/27/2016  . UTI (urinary tract infection) 05/27/2016  . Abdominal pain 05/27/2016  . Pulmonary embolism ([place])   . CHF (congestive heart failure) ([place])   . Essential hypertension 05/23/2016  . Benign neoplasm of ascending colon   . Uncontrolled type 2 diabetes mellitus with complication ([place])   . Pulmonary hypertension   . Primary cancer of left breast with stage 2 nodal metastasis per American Joint Committee on Cancer 7th edition (N2) ([place])   . Abnormal CT scan, liver   . Occult blood in stools   . Abnormal echocardiogram   . Elevated troponin   . Hypoxia 05/12/2016  . Symptomatic anemia 05/11/2016  . Malignant neoplasm of overlapping sites of left breast in female, estrogen receptor positive ([place]) 05/11/2016  . Hyperlipidemia   . Saddle pulmonary embolus ([place]) 04/24/2016  . Diabetes mellitus without complication ([place]) 86/16/8372  . Stroke-like symptoms 04/24/2016  . History  of cancer of left breast 04/24/2016  . Pressure injury of skin 04/24/2016    Allergies  Allergen Reactions  . Dust Mite Extract     Prior to Admission medications   Medication Sig Start Date End Date Taking? Authorizing Provider  anastrozole (ARIMIDEX) 1 MG tablet Take 1 tablet (1 mg total) by mouth daily. 05/22/16  Yes Costin Karlyne Greenspan, MD  apixaban (ELIQUIS) 5 MG TABS tablet Take 1 tablet (5 mg total) by mouth 2 (two) times daily. 06/09/16  Yes Dorena Dew, FNP  atorvastatin (LIPITOR) 20 MG tablet Take 1 tablet (20 mg total) by mouth  at bedtime. 06/09/16  Yes Dorena Dew, FNP  carvedilol (COREG) 6.25 MG tablet Take 1 tablet (6.25 mg total) by mouth 2 (two) times daily with a meal. 06/02/16  Yes Jolaine Artist, MD  feeding supplement, GLUCERNA SHAKE, (GLUCERNA SHAKE) LIQD Take 237 mLs by mouth 3 (three) times daily between meals. 05/29/16  Yes Jennifer Chahn-Yang Choi, DO  folic acid (FOLVITE) 166 MCG tablet Take 800 mcg by mouth every morning.    Yes Historical Provider, MD  glipiZIDE (GLUCOTROL XL) 5 MG 24 hr tablet Take 5 mg by mouth daily as needed. For blood sugar >250   Yes Historical Provider, MD  glucose monitoring kit (FREESTYLE) monitoring kit 1 each by Does not apply route 4 (four) times daily - after meals and at bedtime. 1 month Diabetic Testing Supplies for QAC-QHS accuchecks.  Diagnosis E11.65. Any brand OK 05/03/16  Yes Thurnell Lose, MD  losartan (COZAAR) 50 MG tablet Take 1 tablet (50 mg total) by mouth daily. 05/22/16  Yes Costin Karlyne Greenspan, MD  metFORMIN (GLUCOPHAGE) 1000 MG tablet Take 1,000 mg by mouth 2 (two) times daily. 05/03/16  Yes Historical Provider, MD  Multiple Vitamin (MULTIVITAMIN WITH MINERALS) TABS tablet Take 1 tablet by mouth every morning.    Yes Historical Provider, MD  omeprazole (PRILOSEC) 20 MG capsule Take 20 mg by mouth every morning.    Yes Historical Provider, MD  ondansetron (ZOFRAN) 4 MG tablet Take 1 tablet (4 mg total) by mouth every 8 (eight) hours as needed for nausea or vomiting. 05/23/16  Yes Dorena Dew, FNP  ranitidine (ZANTAC) 300 MG tablet Take 300 mg by mouth at bedtime.   Yes Historical Provider, MD  rivaroxaban (XARELTO) 20 MG TABS tablet Take 1 tablet (20 mg total) by mouth daily with supper. 06/16/16  Yes Shirley Friar, PA-C    Past Medical, Surgical Family and Social History reviewed and updated.    Objective:   Today's Vitals   06/30/16 1314  BP: 118/70  Pulse: 65  Resp: 18  Temp: 97.8 F (36.6 C)  TempSrc: Oral  SpO2: 100%  Weight: 187  lb (84.8 kg)  Height: _0  (1.575 m)    Wt Readings from Last 3 Encounters:  06/30/16 187 lb (84.8 kg)  06/28/16 189 lb 3.2 oz (85.8 kg)  06/16/16 186 lb 12.8 oz (84.7 kg)    Physical Exam  Constitutional: She is oriented to person, place, and time. She appears well-developed and well-nourished.  HENT:  Head: Normocephalic and atraumatic.  Neck: Normal range of motion. Neck supple.  Cardiovascular: Normal rate, regular rhythm, normal heart sounds and intact distal pulses.   Pulmonary/Chest: Effort normal and breath sounds normal.  Lymphadenopathy:    She has no cervical adenopathy.  Neurological: She is alert and oriented to person, place, and time. GCS eye subscore is 4. GCS verbal subscore  is 5. GCS motor subscore is 6.  Generalized left sided weakness compared to right. Gait is unsteady but stable  Skin: Skin is warm and dry.  Psychiatric: She has a normal mood and affect. Her behavior is normal. Judgment and thought content normal.    Assessment & Plan:  1. Arthralgia of Left Temporomandibular joint   Meloxicam 7.5 mg once daily as needed for jaw pain. Lower dose due to significantly impaired renal function and caution to use for short course. Prednisone is not a good option as patient has uncontrolled diabetes. Purchase an over the counter oral appliance to prevent teeth grinding.  2. Weakness due to cerebrovascular accident [facility]) - Ambulatory referral to Home Health  3. Vertigo following CVA (cerebrovascular accident) - PT Vestibular Evaluation  4. Hearing loss, unspecified hearing loss type, unspecified laterality - Ambulatory referral to Audiology   Carroll Sage. Kenton Kingfisher, MSN, FNP-C [facility]

## 2016-07-03 ENCOUNTER — Telehealth: Payer: Self-pay | Admitting: Family Medicine

## 2016-07-03 NOTE — Telephone Encounter (Signed)
Call patients daughter Guy Begin to inquire if mother's TMJ has improved. If it has I would like for her to discontinue taking the Meloxicam.  If she is still experiencing pain, she may continue Meloxicam for an additional 5 days at the dose of 7.5 mg daily. These instructions are related to patients renal function we must be caution as to the extent of time she takes NSAID

## 2016-07-04 ENCOUNTER — Telehealth: Payer: Self-pay | Admitting: Hematology

## 2016-07-04 MED ORDER — METFORMIN HCL 1000 MG PO TABS: 1000.0000 mg | ORAL_TABLET | Freq: Two times a day (BID) | ORAL | 0 refills | Status: DC

## 2016-07-04 MED FILL — ?METFORMIN HCL 1,000 MG TAB: 1000 | 30 days supply | Qty: 60 | Fill #0

## 2016-07-04 NOTE — Telephone Encounter (Signed)
Left a vm for patient to callback 

## 2016-07-04 NOTE — Telephone Encounter (Signed)
Medication refilled for 30 day supply

## 2016-07-04 NOTE — Telephone Encounter (Signed)
Patient daughter states that she has only took 1 pill and the pain is not as bad as it was. Patient was advised that she could take it for 5 more days if needed and then just discontinue.

## 2016-07-04 NOTE — Telephone Encounter (Signed)
Patient requesting refill of Metformin 1000mg  / LOV 06/30/2016

## 2016-07-13 ENCOUNTER — Ambulatory Visit
Admission: RE | Admit: 2016-07-13 | Discharge: 2016-07-13 | Disposition: A | Discharge status: Home / Self Care | Payer: Medicaid Other | Source: Ambulatory Visit | Attending: Oncology | Admitting: Oncology

## 2016-07-13 ENCOUNTER — Ambulatory Visit
Admission: RE | Admit: 2016-07-13 | Discharge: 2016-07-13 | Disposition: A | Discharge status: Home / Self Care | Payer: Medicare Other | Source: Ambulatory Visit | Attending: Oncology | Admitting: Oncology

## 2016-07-13 DIAGNOSIS — C779 Secondary and unspecified malignant neoplasm of lymph node, unspecified: Secondary | ICD-10-CM

## 2016-07-13 DIAGNOSIS — Z13820 Encounter for screening for osteoporosis: Secondary | ICD-10-CM | POA: Diagnosis not present

## 2016-07-13 DIAGNOSIS — C50112 Malignant neoplasm of central portion of left female breast: Secondary | ICD-10-CM

## 2016-07-13 DIAGNOSIS — C50812 Malignant neoplasm of overlapping sites of left female breast: Secondary | ICD-10-CM

## 2016-07-13 DIAGNOSIS — Z1231 Encounter for screening mammogram for malignant neoplasm of breast: Secondary | ICD-10-CM

## 2016-07-13 DIAGNOSIS — Z780 Asymptomatic menopausal state: Secondary | ICD-10-CM | POA: Diagnosis not present

## 2016-07-13 DIAGNOSIS — C50912 Malignant neoplasm of unspecified site of left female breast: Secondary | ICD-10-CM

## 2016-07-13 DIAGNOSIS — Z170 Estrogen receptor positive status [ER+]: Principal | ICD-10-CM

## 2016-07-15 ENCOUNTER — Telehealth: Payer: Self-pay

## 2016-07-15 MED ORDER — CARVEDILOL 6.25 MG PO TABS: 6.2500 mg | ORAL_TABLET | Freq: Two times a day (BID) | ORAL | 2 refills | Status: DC

## 2016-07-15 MED ORDER — LOSARTAN POTASSIUM 50 MG PO TABS: 50.0000 mg | ORAL_TABLET | Freq: Every day | ORAL | 2 refills | Status: DC

## 2016-07-15 MED ORDER — ATORVASTATIN CALCIUM 20 MG PO TABS: 20.0000 mg | ORAL_TABLET | Freq: Every day | ORAL | 2 refills | Status: DC

## 2016-07-15 MED FILL — XARELTO 20 MG TABLET: 20 | 30 days supply | Qty: 30 | Fill #0

## 2016-07-15 MED FILL — CARVEDILOL 6.25 MG TABLET: 6.25 | 30 days supply | Qty: 60 | Fill #0

## 2016-07-15 MED FILL — LOSARTAN POTASSIUM 50 MG TA: 50 | 30 days supply | Qty: 30 | Fill #0

## 2016-07-15 MED FILL — ?ATORVASTATIN 20 MG TABLET: 20 | 30 days supply | Qty: 30 | Fill #0

## 2016-07-15 NOTE — Telephone Encounter (Signed)
Refill for losartan, coreg, and Lipitor sent into pharmacy. Thanks!

## 2016-07-21 ENCOUNTER — Ambulatory Visit (INDEPENDENT_AMBULATORY_CARE_PROVIDER_SITE_OTHER): Payer: Medicare Other | Admitting: Family Medicine

## 2016-07-21 ENCOUNTER — Encounter: Payer: Self-pay | Admitting: Family Medicine

## 2016-07-21 VITALS — BP 113/66 | HR 78 | Temp 98.1°F | Resp 16 | Ht 62.0 in | Wt 187.0 lb

## 2016-07-21 DIAGNOSIS — R531 Weakness: Secondary | ICD-10-CM

## 2016-07-21 DIAGNOSIS — E119 Type 2 diabetes mellitus without complications: Secondary | ICD-10-CM

## 2016-07-21 DIAGNOSIS — I10 Essential (primary) hypertension: Secondary | ICD-10-CM

## 2016-07-21 DIAGNOSIS — I2692 Saddle embolus of pulmonary artery without acute cor pulmonale: Secondary | ICD-10-CM

## 2016-07-21 DIAGNOSIS — I639 Cerebral infarction, unspecified: Secondary | ICD-10-CM

## 2016-07-21 DIAGNOSIS — M79652 Pain in left thigh: Secondary | ICD-10-CM

## 2016-07-21 MED ORDER — MENTHOL (TOPICAL ANALGESIC) 4 % EX GEL: 1.0000 "application " | Freq: Three times a day (TID) | CUTANEOUS | 5 refills | Status: DC | PRN

## 2016-07-21 NOTE — Patient Instructions (Signed)
Left thigh discomfort: Apply Biofreeze cream to left thigh three times daily as needed for mild to moderate pain  Bilateral lower extremity swelling:  Elevate lower extremities to heart level while at rest  History of Pulmonary Embolism and travel: It is ok to travel to [place], take anti-coagulation consistently.  Understand What Can Increase Your Risk for Blood Clots Even if you travel a long distance, the risk of developing a blood clot is generally very small. Your level of risk depends on the duration of travel as well as whether you have any other risks for blood clots. Most people who develop travel-associated blood clots have one or more other risks for blood clots, such as: Older age (risk increases after age 54)  Obesity (body mass index [BMI] greater than 30kg/m2)  Recent surgery or injury (within 3 months)  Use of estrogen-containing contraceptives (for example, birth control pills, rings,patches)  Hormone replacement therapy (medical treatment in which hormones are given to reduce the effects of menopause)  Pregnancy and the postpartum period (up to 6 weeks after childbirth)  Previous blood clot or a family history of blood clots  Active cancer or recent cancer treatment  Limited mobility (for example, a leg cast)  Catheter placed in a large vein  Varicose veins The combination of long-distance travel with one or more of these risks may increase the likelihood of developing a blood clot. The more risks you have, the greater your chances of experiencing a blood clot. If you plan on traveling soon, talk with your doctor to learn more about what you can do to protect your health. The most important thing you can do is to learn and recognize the symptoms of blood clots.

## 2016-07-21 NOTE — Progress Notes (Signed)
Subjective:    Patient ID: Carrie Watts, female    DOB: Nov 20, 1950, 66 y.o.   MRN: [id number]  HPI  Carrie Watts, a 67 year old female with a history of DMII, hypertension,  DVT, PE, and breast cancer presents accompanied by daughter for a follow up of chronic conditions.  She was recently diagnosed with a saddle pulmonary embolism. She has been taking Eliquis for PE. She was admitted to inpatient services in January. She developed chest pain and stroke like symptoms. She was having frequent flares of left sided, sharp chest pains prior to inpatient admission. Patient has been on anticoagulant therapy over the past several months. She is planning to take a trip to [place] by plane and is concerned of developing a PE.    Patient also has a history of left breast cancer and is followed by Dr. Griffith Citron, oncologist. She is consistently taking Arimidex.    Patient has a history of DM II. She has been taking Metformin.Carrie Watts most recent hemoglobin a1C is 6.5. She denies foot ulcerations, increase appetite, nausea, paresthesia of the feet, polydipsia, polyuria, visual disturbances, vomitting and weight loss. She also has a history of hypertension.  She is not exercising and is not adherent to low salt diet.  Patient does not check blood pressure at home.  Patient denies irregular heart beat, lower extremity edema, orthopnea and palpitations.  Cardiovascular risk factors include: dyslipidemia and hypertension.   She continues to have weakness and deconditioning. Patient was previously referred to physical therapy.  Past Medical History:  Diagnosis Date  . Angina of effort ([place])   . Atrial fibrillation ([place])   . Cancer (HCC)    breast  . CHF (congestive heart failure) ([place])   . Deep venous thrombosis ([place])   . Diabetes mellitus without complication ([place])   . Hypercholesterolemia   . Hypertension   . Pulmonary embolism ([place])   . Renal disorder   . Stroke [facility])    Social History    Social History  . Marital status: Divorced    Spouse name: N/A  . Number of children: N/A  . Years of education: N/A   Occupational History  . Not on file.   Social History Main Topics  . Smoking status: Never Smoker  . Smokeless tobacco: Never Used  . Alcohol use No  . Drug use: No  . Sexual activity: Not on file   Other Topics Concern  . Not on file   Social History Narrative  . No narrative on file   Immunization History  Administered Date(s) Administered  . Influenza,inj,Quad PF,36+ Mos 05/15/2016  . Pneumococcal Polysaccharide-23 05/15/2016     Review of Systems  Constitutional: Negative for unexpected weight change.  HENT: Negative.   Eyes: Negative.  Negative for visual disturbance.  Respiratory: Negative.  Negative for shortness of breath.   Cardiovascular: Negative.  Negative for chest pain, palpitations and leg swelling.  Gastrointestinal: Negative.   Endocrine: Negative.  Negative for cold intolerance, heat intolerance, polydipsia, polyphagia and polyuria.  Genitourinary: Negative.   Musculoskeletal: Positive for myalgias (occasional right thigh pain).       Ambulating with assistance.   Skin: Negative.   Allergic/Immunologic: Negative.  Negative for environmental allergies and immunocompromised state.  Neurological: Positive for weakness.  Hematological: Negative.        Objective:   Physical Exam  Constitutional: She is oriented to person, place, and time. She appears well-developed and well-nourished.  HENT:  Head: Normocephalic and atraumatic.  Right Ear: External ear normal.  Left Ear: External ear normal.  Nose: Nose normal.  Mouth/Throat: Oropharynx is clear and moist.  Eyes: Conjunctivae and EOM are normal. Pupils are equal, round, and reactive to light.  Neck: Normal range of motion. Neck supple.  Cardiovascular: Normal rate, normal heart sounds and intact distal pulses.   Pulmonary/Chest: Effort normal and breath sounds normal.  S/P  mastectomy, post surgical scar; tender to palpation  Abdominal: Soft. Bowel sounds are normal.  Musculoskeletal:  Generalized weakness Ambulating with assistance    2/5 weakness to upper extremities  Neurological: She is alert and oriented to person, place, and time. She has normal reflexes.  Monofilament test negative  Skin: Skin is warm and dry. No rash noted.  Hyperpigmentation to fingernails and toenails  Psychiatric: She has a normal mood and affect. Her behavior is normal. Judgment and thought content normal.     BP 113/66 (BP Location: Right Arm, Patient Position: Sitting, Cuff Size: Large)   Pulse 78   Temp 98.1 F (36.7 C) (Oral)   Resp 16   Ht 5\' 2"  (1.575 m)   Wt 187 lb (84.8 kg)   SpO2 100%   BMI 34.20 kg/m  Assessment & Plan:  1. Diabetes mellitus without complication (HCC) Current hemoglobin a1C is 6.5, will continue Metformin and carbohydrate modified diet. Her daughter assists with meal planning.  2. Essential hypertension Blood pressure is at goal on current medication regimen. She is also followed by cardiology - CBC with Differential; Future - COMPLETE METABOLIC PANEL WITH GFR; Future 3. Acute saddle pulmonary embolism without acute cor pulmonale (HCC) Continue Xarelto as previously prescribed. Reviewed previous CT from January; patient had a decreased clot burden at the time. She is requesting a repeat CT angiogram. My recommendation is for Carrie Watts to take Xarelto consistently and take her trip, will not repeat imaging at this time. I 4. Weakness generalized Patient warrants an outpatient PT evaluation. She also will need a home evaluation. She is a high fall risk at home and is having difficulty with ADLs. Referral was previously sent.   5. Left thigh pain - Menthol, Topical Analgesic, (BIOFREEZE) 4 % GEL; Apply 1 application topically 3 (three) times daily as needed.  Dispense: 1 Tube; Refill: 5   RTC: 3 months for chronic conditions. Will follow up  by phone following medication review   Donia Pounds  MSN, FNP-C [facility] [address] [place], [place] [address] [phone]

## 2016-07-27 ENCOUNTER — Telehealth: Payer: Self-pay | Admitting: Internal Medicine

## 2016-07-27 ENCOUNTER — Telehealth: Payer: Self-pay

## 2016-07-27 DIAGNOSIS — M6281 Muscle weakness (generalized): Secondary | ICD-10-CM | POA: Diagnosis not present

## 2016-07-27 DIAGNOSIS — I69398 Other sequelae of cerebral infarction: Secondary | ICD-10-CM | POA: Diagnosis not present

## 2016-07-27 DIAGNOSIS — I130 Hypertensive heart and chronic kidney disease with heart failure and stage 1 through stage 4 chronic kidney disease, or unspecified chronic kidney disease: Secondary | ICD-10-CM | POA: Diagnosis not present

## 2016-07-27 DIAGNOSIS — Z853 Personal history of malignant neoplasm of breast: Secondary | ICD-10-CM | POA: Diagnosis not present

## 2016-07-27 DIAGNOSIS — N184 Chronic kidney disease, stage 4 (severe): Secondary | ICD-10-CM | POA: Diagnosis not present

## 2016-07-27 DIAGNOSIS — I509 Heart failure, unspecified: Secondary | ICD-10-CM | POA: Diagnosis not present

## 2016-07-27 DIAGNOSIS — Z7984 Long term (current) use of oral hypoglycemic drugs: Secondary | ICD-10-CM | POA: Diagnosis not present

## 2016-07-27 DIAGNOSIS — E1122 Type 2 diabetes mellitus with diabetic chronic kidney disease: Secondary | ICD-10-CM | POA: Diagnosis not present

## 2016-07-27 NOTE — Telephone Encounter (Signed)
Spoke with patients daughter, She was asking about the Audiology Referral. I have provided number for patient to call to check on referral status. Thanks!

## 2016-07-27 NOTE — Telephone Encounter (Signed)
Caller calling to request orders for home health phy therapy 2x week for 4 weeks. Please f/u with caller.

## 2016-07-27 NOTE — Telephone Encounter (Signed)
Verbal orders are needed for this patient.

## 2016-07-29 ENCOUNTER — Telehealth: Payer: Self-pay | Admitting: *Deleted

## 2016-07-29 DIAGNOSIS — I509 Heart failure, unspecified: Secondary | ICD-10-CM | POA: Diagnosis not present

## 2016-07-29 DIAGNOSIS — I130 Hypertensive heart and chronic kidney disease with heart failure and stage 1 through stage 4 chronic kidney disease, or unspecified chronic kidney disease: Secondary | ICD-10-CM | POA: Diagnosis not present

## 2016-07-29 DIAGNOSIS — I69398 Other sequelae of cerebral infarction: Secondary | ICD-10-CM | POA: Diagnosis not present

## 2016-07-29 DIAGNOSIS — M6281 Muscle weakness (generalized): Secondary | ICD-10-CM | POA: Diagnosis not present

## 2016-07-29 DIAGNOSIS — E1122 Type 2 diabetes mellitus with diabetic chronic kidney disease: Secondary | ICD-10-CM | POA: Diagnosis not present

## 2016-07-29 DIAGNOSIS — N184 Chronic kidney disease, stage 4 (severe): Secondary | ICD-10-CM | POA: Diagnosis not present

## 2016-07-29 NOTE — Telephone Encounter (Signed)
Call from [facility] reporting they have obtained films of pt's mammogram from [place] to take to [facility]. Asks if Dr. Jana Hakim also needs to view films. Informed her he will likely not need to view these personally as long as the imaging center has them to compare. She will store them at home in case they are needed.

## 2016-07-30 NOTE — Telephone Encounter (Signed)
Please give the verbal order.  Thanks.  

## 2016-08-01 ENCOUNTER — Telehealth: Payer: Self-pay | Admitting: Hematology

## 2016-08-01 NOTE — Telephone Encounter (Signed)
Carrie Watts with [facility] is calling for verbal orders for physical therapy.  Physical therapy twice a week for two weeks / Please call kelly back with approval at [phone]

## 2016-08-02 DIAGNOSIS — M6281 Muscle weakness (generalized): Secondary | ICD-10-CM | POA: Diagnosis not present

## 2016-08-02 DIAGNOSIS — E1122 Type 2 diabetes mellitus with diabetic chronic kidney disease: Secondary | ICD-10-CM | POA: Diagnosis not present

## 2016-08-02 DIAGNOSIS — N184 Chronic kidney disease, stage 4 (severe): Secondary | ICD-10-CM | POA: Diagnosis not present

## 2016-08-02 DIAGNOSIS — I130 Hypertensive heart and chronic kidney disease with heart failure and stage 1 through stage 4 chronic kidney disease, or unspecified chronic kidney disease: Secondary | ICD-10-CM | POA: Diagnosis not present

## 2016-08-02 DIAGNOSIS — I69398 Other sequelae of cerebral infarction: Secondary | ICD-10-CM | POA: Diagnosis not present

## 2016-08-02 DIAGNOSIS — I509 Heart failure, unspecified: Secondary | ICD-10-CM | POA: Diagnosis not present

## 2016-08-03 ENCOUNTER — Other Ambulatory Visit: Payer: Self-pay | Admitting: Family Medicine

## 2016-08-04 MED ORDER — METFORMIN HCL 1000 MG PO TABS: 1000.0000 mg | ORAL_TABLET | Freq: Two times a day (BID) | ORAL | 0 refills | Status: DC

## 2016-08-04 MED FILL — ANASTROZOLE 1 MG TABLET: 1 | 30 days supply | Qty: 30 | Fill #0

## 2016-08-04 MED FILL — traMADol HCL 50 MG TABS: 50 | 15 days supply | Qty: 60 | Fill #0

## 2016-08-04 MED FILL — TRUE METRIX GLUCOSE TEST ST: 13 days supply | Qty: 50 | Fill #0

## 2016-08-04 MED FILL — metFORMIN HCL 1000 MG TABS: 1000 | 30 days supply | Qty: 60 | Fill #0

## 2016-08-05 DIAGNOSIS — I130 Hypertensive heart and chronic kidney disease with heart failure and stage 1 through stage 4 chronic kidney disease, or unspecified chronic kidney disease: Secondary | ICD-10-CM | POA: Diagnosis not present

## 2016-08-05 DIAGNOSIS — I69398 Other sequelae of cerebral infarction: Secondary | ICD-10-CM | POA: Diagnosis not present

## 2016-08-05 DIAGNOSIS — N184 Chronic kidney disease, stage 4 (severe): Secondary | ICD-10-CM | POA: Diagnosis not present

## 2016-08-05 DIAGNOSIS — M26629 Arthralgia of temporomandibular joint, unspecified side: Secondary | ICD-10-CM | POA: Diagnosis not present

## 2016-08-05 DIAGNOSIS — E1122 Type 2 diabetes mellitus with diabetic chronic kidney disease: Secondary | ICD-10-CM | POA: Diagnosis not present

## 2016-08-05 DIAGNOSIS — M6281 Muscle weakness (generalized): Secondary | ICD-10-CM | POA: Diagnosis not present

## 2016-08-05 DIAGNOSIS — I509 Heart failure, unspecified: Secondary | ICD-10-CM | POA: Diagnosis not present

## 2016-08-08 NOTE — Telephone Encounter (Signed)
MA provided VO for PT 2 times a week for 4 weeks. Medical Assistant left message on patient's home and cell voicemail. Voicemail states to give a call back to [place] with [facility] at [phone].

## 2016-08-09 DIAGNOSIS — I130 Hypertensive heart and chronic kidney disease with heart failure and stage 1 through stage 4 chronic kidney disease, or unspecified chronic kidney disease: Secondary | ICD-10-CM | POA: Diagnosis not present

## 2016-08-09 DIAGNOSIS — E1122 Type 2 diabetes mellitus with diabetic chronic kidney disease: Secondary | ICD-10-CM | POA: Diagnosis not present

## 2016-08-09 DIAGNOSIS — M6281 Muscle weakness (generalized): Secondary | ICD-10-CM | POA: Diagnosis not present

## 2016-08-09 DIAGNOSIS — I509 Heart failure, unspecified: Secondary | ICD-10-CM | POA: Diagnosis not present

## 2016-08-09 DIAGNOSIS — I69398 Other sequelae of cerebral infarction: Secondary | ICD-10-CM | POA: Diagnosis not present

## 2016-08-09 DIAGNOSIS — N184 Chronic kidney disease, stage 4 (severe): Secondary | ICD-10-CM | POA: Diagnosis not present

## 2016-08-16 MED FILL — XARELTO 20 MG TABLET: 20 | 15 days supply | Qty: 15 | Fill #1

## 2016-08-18 ENCOUNTER — Telehealth: Payer: Self-pay | Admitting: Internal Medicine

## 2016-08-18 DIAGNOSIS — I69398 Other sequelae of cerebral infarction: Secondary | ICD-10-CM | POA: Diagnosis not present

## 2016-08-18 DIAGNOSIS — I509 Heart failure, unspecified: Secondary | ICD-10-CM | POA: Diagnosis not present

## 2016-08-18 DIAGNOSIS — M6281 Muscle weakness (generalized): Secondary | ICD-10-CM | POA: Diagnosis not present

## 2016-08-18 DIAGNOSIS — N184 Chronic kidney disease, stage 4 (severe): Secondary | ICD-10-CM | POA: Diagnosis not present

## 2016-08-18 DIAGNOSIS — E1122 Type 2 diabetes mellitus with diabetic chronic kidney disease: Secondary | ICD-10-CM | POA: Diagnosis not present

## 2016-08-18 DIAGNOSIS — I130 Hypertensive heart and chronic kidney disease with heart failure and stage 1 through stage 4 chronic kidney disease, or unspecified chronic kidney disease: Secondary | ICD-10-CM | POA: Diagnosis not present

## 2016-08-18 NOTE — Telephone Encounter (Signed)
Carrie Watts called the office to get verbal orders for home physical therapy for one to two times a week for three weeks. Pt missed a visit because she was out of town. Please follow up.  Thank you.

## 2016-08-18 NOTE — Telephone Encounter (Signed)
Medical Assistant left message on patient's home and cell voicemail. Voicemail states to give a call back to [place] with [facility] at [phone]. MA provided VO for 2 times a week for 3 weeks of PT.

## 2016-08-22 ENCOUNTER — Telehealth: Payer: Self-pay

## 2016-08-22 DIAGNOSIS — E1122 Type 2 diabetes mellitus with diabetic chronic kidney disease: Secondary | ICD-10-CM | POA: Diagnosis not present

## 2016-08-22 DIAGNOSIS — I509 Heart failure, unspecified: Secondary | ICD-10-CM | POA: Diagnosis not present

## 2016-08-22 DIAGNOSIS — M6281 Muscle weakness (generalized): Secondary | ICD-10-CM | POA: Diagnosis not present

## 2016-08-22 DIAGNOSIS — N184 Chronic kidney disease, stage 4 (severe): Secondary | ICD-10-CM | POA: Diagnosis not present

## 2016-08-22 DIAGNOSIS — I130 Hypertensive heart and chronic kidney disease with heart failure and stage 1 through stage 4 chronic kidney disease, or unspecified chronic kidney disease: Secondary | ICD-10-CM | POA: Diagnosis not present

## 2016-08-22 DIAGNOSIS — I69398 Other sequelae of cerebral infarction: Secondary | ICD-10-CM | POA: Diagnosis not present

## 2016-08-22 MED ORDER — ATORVASTATIN CALCIUM 20 MG PO TABS: 20.0000 mg | ORAL_TABLET | Freq: Every day | ORAL | 2 refills | Status: DC

## 2016-08-22 MED ORDER — RIVAROXABAN 20 MG PO TABS: 20.0000 mg | ORAL_TABLET | Freq: Every day | ORAL | 3 refills | Status: DC

## 2016-08-22 MED ORDER — LOSARTAN POTASSIUM 50 MG PO TABS: 50.0000 mg | ORAL_TABLET | Freq: Every day | ORAL | 2 refills | Status: DC

## 2016-08-22 MED FILL — ATORVASTATIN 20 MG TABLET: 20 | 30 days supply | Qty: 30 | Fill #0

## 2016-08-22 MED FILL — LOSARTAN POTASSIUM 50 MG TA: 50 | 30 days supply | Qty: 30 | Fill #0

## 2016-08-22 NOTE — Telephone Encounter (Signed)
[facility] Thanks!  

## 2016-08-24 DIAGNOSIS — I509 Heart failure, unspecified: Secondary | ICD-10-CM | POA: Diagnosis not present

## 2016-08-24 DIAGNOSIS — M6281 Muscle weakness (generalized): Secondary | ICD-10-CM | POA: Diagnosis not present

## 2016-08-24 DIAGNOSIS — I69398 Other sequelae of cerebral infarction: Secondary | ICD-10-CM | POA: Diagnosis not present

## 2016-08-24 DIAGNOSIS — I130 Hypertensive heart and chronic kidney disease with heart failure and stage 1 through stage 4 chronic kidney disease, or unspecified chronic kidney disease: Secondary | ICD-10-CM | POA: Diagnosis not present

## 2016-08-24 DIAGNOSIS — E1122 Type 2 diabetes mellitus with diabetic chronic kidney disease: Secondary | ICD-10-CM | POA: Diagnosis not present

## 2016-08-24 DIAGNOSIS — N184 Chronic kidney disease, stage 4 (severe): Secondary | ICD-10-CM | POA: Diagnosis not present

## 2016-08-29 ENCOUNTER — Other Ambulatory Visit: Payer: Self-pay | Admitting: Family Medicine

## 2016-08-29 DIAGNOSIS — N179 Acute kidney failure, unspecified: Secondary | ICD-10-CM | POA: Diagnosis not present

## 2016-08-29 DIAGNOSIS — E1129 Type 2 diabetes mellitus with other diabetic kidney complication: Secondary | ICD-10-CM | POA: Diagnosis not present

## 2016-08-29 MED FILL — XARELTO 20 MG TABLET: 20 | 45 days supply | Qty: 45 | Fill #2

## 2016-08-30 ENCOUNTER — Telehealth: Payer: Self-pay

## 2016-08-30 DIAGNOSIS — I69398 Other sequelae of cerebral infarction: Secondary | ICD-10-CM | POA: Diagnosis not present

## 2016-08-30 DIAGNOSIS — N184 Chronic kidney disease, stage 4 (severe): Secondary | ICD-10-CM | POA: Diagnosis not present

## 2016-08-30 DIAGNOSIS — I509 Heart failure, unspecified: Secondary | ICD-10-CM | POA: Diagnosis not present

## 2016-08-30 DIAGNOSIS — I130 Hypertensive heart and chronic kidney disease with heart failure and stage 1 through stage 4 chronic kidney disease, or unspecified chronic kidney disease: Secondary | ICD-10-CM | POA: Diagnosis not present

## 2016-08-30 DIAGNOSIS — E1122 Type 2 diabetes mellitus with diabetic chronic kidney disease: Secondary | ICD-10-CM | POA: Diagnosis not present

## 2016-08-30 DIAGNOSIS — M6281 Muscle weakness (generalized): Secondary | ICD-10-CM | POA: Diagnosis not present

## 2016-08-30 MED ORDER — CARVEDILOL 6.25 MG PO TABS: 6.2500 mg | ORAL_TABLET | Freq: Two times a day (BID) | ORAL | 2 refills | Status: DC

## 2016-08-30 MED ORDER — METFORMIN HCL 1000 MG PO TABS: 1000.0000 mg | ORAL_TABLET | Freq: Two times a day (BID) | ORAL | 2 refills | Status: DC

## 2016-08-30 MED FILL — CARVEDILOL 6.25 MG TABLET: 6.25 | 30 days supply | Qty: 60 | Fill #0

## 2016-08-30 MED FILL — metFORMIN HCL 1000 MG TABS: 1000 | 30 days supply | Qty: 60 | Fill #0

## 2016-08-30 NOTE — Telephone Encounter (Signed)
This has been refilled.  Thanks. 

## 2016-09-01 DIAGNOSIS — I509 Heart failure, unspecified: Secondary | ICD-10-CM | POA: Diagnosis not present

## 2016-09-01 DIAGNOSIS — M6281 Muscle weakness (generalized): Secondary | ICD-10-CM | POA: Diagnosis not present

## 2016-09-01 DIAGNOSIS — I69398 Other sequelae of cerebral infarction: Secondary | ICD-10-CM | POA: Diagnosis not present

## 2016-09-01 DIAGNOSIS — E1122 Type 2 diabetes mellitus with diabetic chronic kidney disease: Secondary | ICD-10-CM | POA: Diagnosis not present

## 2016-09-01 DIAGNOSIS — I130 Hypertensive heart and chronic kidney disease with heart failure and stage 1 through stage 4 chronic kidney disease, or unspecified chronic kidney disease: Secondary | ICD-10-CM | POA: Diagnosis not present

## 2016-09-01 DIAGNOSIS — N184 Chronic kidney disease, stage 4 (severe): Secondary | ICD-10-CM | POA: Diagnosis not present

## 2016-09-06 ENCOUNTER — Other Ambulatory Visit: Payer: Self-pay | Admitting: Family Medicine

## 2016-09-06 ENCOUNTER — Telehealth: Payer: Self-pay

## 2016-09-06 DIAGNOSIS — E1122 Type 2 diabetes mellitus with diabetic chronic kidney disease: Secondary | ICD-10-CM | POA: Diagnosis not present

## 2016-09-06 DIAGNOSIS — N184 Chronic kidney disease, stage 4 (severe): Secondary | ICD-10-CM | POA: Diagnosis not present

## 2016-09-06 DIAGNOSIS — R17 Unspecified jaundice: Secondary | ICD-10-CM

## 2016-09-06 DIAGNOSIS — I130 Hypertensive heart and chronic kidney disease with heart failure and stage 1 through stage 4 chronic kidney disease, or unspecified chronic kidney disease: Secondary | ICD-10-CM | POA: Diagnosis not present

## 2016-09-06 DIAGNOSIS — M6281 Muscle weakness (generalized): Secondary | ICD-10-CM | POA: Diagnosis not present

## 2016-09-06 DIAGNOSIS — I69398 Other sequelae of cerebral infarction: Secondary | ICD-10-CM | POA: Diagnosis not present

## 2016-09-06 DIAGNOSIS — I509 Heart failure, unspecified: Secondary | ICD-10-CM | POA: Diagnosis not present

## 2016-09-06 NOTE — Telephone Encounter (Signed)
Patient called and made and appointment for Friday June 1st with [place], with the complaint of "yellowing eyes". After speaking with provider regarding this, provider is asking that patient come tomorrow for a Lab draw before the appointment. I have called today 09/06/2016 @8 :35am  and left a message asking patient to return call. Need to schedule patient for this lab draw when she returns call. Thanks!

## 2016-09-07 ENCOUNTER — Ambulatory Visit (HOSPITAL_COMMUNITY)
Admission: RE | Admit: 2016-09-07 | Discharge: 2016-09-07 | Disposition: A | Discharge status: Home / Self Care | Payer: Medicare Other | Source: Ambulatory Visit | Attending: Family Medicine | Admitting: Family Medicine

## 2016-09-07 ENCOUNTER — Encounter: Payer: Self-pay | Admitting: Family Medicine

## 2016-09-07 ENCOUNTER — Encounter (HOSPITAL_COMMUNITY): Payer: Self-pay | Admitting: Internal Medicine

## 2016-09-07 ENCOUNTER — Ambulatory Visit (HOSPITAL_COMMUNITY)
Admission: RE | Admit: 2016-09-07 | Discharge: 2016-09-07 | Disposition: A | Discharge status: Home / Self Care | Payer: Medicare Other | Source: Ambulatory Visit | Attending: Internal Medicine | Admitting: Internal Medicine

## 2016-09-07 ENCOUNTER — Ambulatory Visit (INDEPENDENT_AMBULATORY_CARE_PROVIDER_SITE_OTHER): Payer: Medicare Other | Admitting: Family Medicine

## 2016-09-07 VITALS — BP 135/74 | HR 67 | Wt 184.1 lb

## 2016-09-07 VITALS — BP 117/74 | HR 70 | Temp 98.3°F | Resp 16 | Ht 62.0 in | Wt 184.0 lb

## 2016-09-07 DIAGNOSIS — E119 Type 2 diabetes mellitus without complications: Secondary | ICD-10-CM

## 2016-09-07 DIAGNOSIS — I4891 Unspecified atrial fibrillation: Secondary | ICD-10-CM | POA: Insufficient documentation

## 2016-09-07 DIAGNOSIS — I509 Heart failure, unspecified: Secondary | ICD-10-CM

## 2016-09-07 DIAGNOSIS — R8299 Other abnormal findings in urine: Secondary | ICD-10-CM | POA: Diagnosis not present

## 2016-09-07 DIAGNOSIS — R82998 Other abnormal findings in urine: Secondary | ICD-10-CM

## 2016-09-07 DIAGNOSIS — Z170 Estrogen receptor positive status [ER+]: Secondary | ICD-10-CM | POA: Diagnosis not present

## 2016-09-07 DIAGNOSIS — R0609 Other forms of dyspnea: Secondary | ICD-10-CM | POA: Diagnosis not present

## 2016-09-07 DIAGNOSIS — R0600 Dyspnea, unspecified: Secondary | ICD-10-CM

## 2016-09-07 DIAGNOSIS — K649 Unspecified hemorrhoids: Secondary | ICD-10-CM | POA: Diagnosis not present

## 2016-09-07 DIAGNOSIS — I10 Essential (primary) hypertension: Secondary | ICD-10-CM

## 2016-09-07 DIAGNOSIS — Z79899 Other long term (current) drug therapy: Secondary | ICD-10-CM | POA: Diagnosis not present

## 2016-09-07 DIAGNOSIS — I5022 Chronic systolic (congestive) heart failure: Secondary | ICD-10-CM | POA: Diagnosis not present

## 2016-09-07 DIAGNOSIS — Z8673 Personal history of transient ischemic attack (TIA), and cerebral infarction without residual deficits: Secondary | ICD-10-CM | POA: Insufficient documentation

## 2016-09-07 DIAGNOSIS — I639 Cerebral infarction, unspecified: Secondary | ICD-10-CM

## 2016-09-07 DIAGNOSIS — C50112 Malignant neoplasm of central portion of left female breast: Secondary | ICD-10-CM

## 2016-09-07 DIAGNOSIS — Z7984 Long term (current) use of oral hypoglycemic drugs: Secondary | ICD-10-CM | POA: Diagnosis not present

## 2016-09-07 DIAGNOSIS — E1122 Type 2 diabetes mellitus with diabetic chronic kidney disease: Secondary | ICD-10-CM | POA: Diagnosis not present

## 2016-09-07 DIAGNOSIS — I2699 Other pulmonary embolism without acute cor pulmonale: Secondary | ICD-10-CM

## 2016-09-07 DIAGNOSIS — N189 Chronic kidney disease, unspecified: Secondary | ICD-10-CM | POA: Diagnosis not present

## 2016-09-07 DIAGNOSIS — K5900 Constipation, unspecified: Secondary | ICD-10-CM

## 2016-09-07 DIAGNOSIS — I130 Hypertensive heart and chronic kidney disease with heart failure and stage 1 through stage 4 chronic kidney disease, or unspecified chronic kidney disease: Secondary | ICD-10-CM | POA: Diagnosis not present

## 2016-09-07 LAB — ECHOCARDIOGRAM COMPLETE
AVLVOTPG: 3 mmHg
CHL CUP DOP CALC LVOT VTI: 19.3 cm
CHL CUP MV DEC (S): 162
CHL CUP TV REG PEAK VELOCITY: 198 cm/s
E decel time: 162 msec
E/e' ratio: 11.56
FS: 26 % — AB (ref 28–44)
HEIGHTINCHES: 62 in
IVS/LV PW RATIO, ED: 0.95
LA ID, A-P, ES: 32 mm
LA diam index: 1.73 cm/m2
LA vol index: 28.3 mL/m2
LAVOL: 52.4 mL
LAVOLA4C: 38.8 mL
LDCA: 3.14 cm2
LEFT ATRIUM END SYS DIAM: 32 mm
LV E/e' medial: 11.56
LV E/e'average: 11.56
LV PW d: 9.26 mm — AB (ref 0.6–1.1)
LVELAT: 8.16 cm/s
LVOT SV: 61 mL
LVOT peak vel: 84.9 cm/s
LVOTD: 20 mm
MV pk E vel: 94.3 m/s
MVPG: 4 mmHg
MVPKAVEL: 87 m/s
RV sys press: 19 mmHg
TAPSE: 21.6 mm
TDI e' lateral: 8.16
TDI e' medial: 7.62
TR max vel: 198 cm/s
WEIGHTICAEL: 2944 [oz_av]

## 2016-09-07 LAB — POCT URINALYSIS DIP (DEVICE)
Bilirubin Urine: NEGATIVE
GLUCOSE, UA: NEGATIVE mg/dL
Ketones, ur: NEGATIVE mg/dL
Nitrite: NEGATIVE
PROTEIN: NEGATIVE mg/dL
Specific Gravity, Urine: 1.01 (ref 1.005–1.030)
Urobilinogen, UA: 0.2 mg/dL (ref 0.0–1.0)
pH: 5.5 (ref 5.0–8.0)

## 2016-09-07 LAB — POCT GLYCOSYLATED HEMOGLOBIN (HGB A1C): HEMOGLOBIN A1C: 5.6

## 2016-09-07 MED ORDER — DOCUSATE SODIUM 100 MG PO CAPS: 100.0000 mg | ORAL_CAPSULE | Freq: Two times a day (BID) | ORAL | 1 refills | Status: DC

## 2016-09-07 MED ORDER — HYDROCORTISONE 2.5 % RE CREA: 1.0000 "application " | TOPICAL_CREAM | Freq: Two times a day (BID) | RECTAL | 0 refills | Status: DC | PRN

## 2016-09-07 MED ORDER — LOSARTAN POTASSIUM 50 MG PO TABS: 75.0000 mg | ORAL_TABLET | Freq: Every day | ORAL | 6 refills | Status: DC

## 2016-09-07 MED FILL — HYDROCORTISONE 2.5% CREAM: 2.5 | 15 days supply | Qty: 30 | Fill #0

## 2016-09-07 MED FILL — LOSARTAN POTASSIUM 50 MG TA: 50 | 30 days supply | Qty: 45 | Fill #0

## 2016-09-07 NOTE — Patient Instructions (Addendum)
Hemorrhoids:  Colace 100 mg HS A high fiber diet with plenty of fluids (up to 8 glasses of water daily) is suggested to relieve these symptoms.    Dyspnea on exertion:  Patient has a 2 D echocardiogram scheduled for today at 2 pm Will follow up by phone with laboratory results    Hemorrhoids Hemorrhoids are swollen veins in and around the rectum or anus. Hemorrhoids can cause pain, itching, or bleeding. Most of the time, they do not cause serious problems. They usually get better with diet changes, lifestyle changes, and other home treatments. Follow these instructions at home: Eating and drinking   Eat foods that have fiber, such as whole grains, beans, nuts, fruits, and vegetables. Ask your doctor about taking products that have added fiber (fibersupplements).  Drink enough fluid to keep your pee (urine) clear or pale yellow. For Pain and Swelling   Take a warm-water bath (sitz bath) for 20 minutes to ease pain. Do this 3-4 times a day.  If directed, put ice on the painful area. It may be helpful to use ice between your warm baths.  Put ice in a plastic bag.  Place a towel between your skin and the bag.  Leave the ice on for 20 minutes, 2-3 times a day. General instructions   Take over-the-counter and prescription medicines only as told by your doctor.  Medicated creams and medicines that are inserted into the anus (suppositories) may be used or applied as told.  Exercise often.  Go to the bathroom when you have the urge to poop (to have a bowel movement). Do not wait.  Avoid pushing too hard (straining) when you poop.  Keep the butt area dry and clean. Use wet toilet paper or moist paper towels.  Do not sit on the toilet for a long time. Contact a doctor if:  You have any of these:  Pain and swelling that do not get better with treatment or medicine.  Bleeding that will not stop.  Trouble pooping or you cannot poop.  Pain or swelling outside the area of the  hemorrhoids. This information is not intended to replace advice given to you by your health care provider. Make sure you discuss any questions you have with your health care provider. Document Released: 01/05/2008 Document Revised: 09/03/2015 Document Reviewed: 12/10/2014 Elsevier Interactive Patient Education  2017 Elsevier Inc.  Constipation, Adult Constipation is when a person:  Poops (has a bowel movement) fewer times in a week than normal.  Has a hard time pooping.  Has poop that is dry, hard, or bigger than normal. Follow these instructions at home: Eating and drinking    Eat foods that have a lot of fiber, such as:  Fresh fruits and vegetables.  Whole grains.  Beans.  Eat less of foods that are high in fat, low in fiber, or overly processed, such as:  [place] fries.  Hamburgers.  Cookies.  Candy.  Soda.  Drink enough fluid to keep your pee (urine) clear or pale yellow. General instructions   Exercise regularly or as told by your doctor.  Go to the restroom when you feel like you need to poop. Do not hold it in.  Take over-the-counter and prescription medicines only as told by your doctor. These include any fiber supplements.  Do pelvic floor retraining exercises, such as:  Doing deep breathing while relaxing your lower belly (abdomen).  Relaxing your pelvic floor while pooping.  Watch your condition for any changes.  Keep all follow-up  visits as told by your doctor. This is important. Contact a doctor if:  You have pain that gets worse.  You have a fever.  You have not pooped for 4 days.  You throw up (vomit).  You are not hungry.  You lose weight.  You are bleeding from the anus.  You have thin, pencil-like poop (stool). Get help right away if:  You have a fever, and your symptoms suddenly get worse.  You leak poop or have blood in your poop.  Your belly feels hard or bigger than normal (is bloated).  You have very bad belly  pain.  You feel dizzy or you faint. This information is not intended to replace advice given to you by your health care provider. Make sure you discuss any questions you have with your health care provider. Document Released: 09/14/2007 Document Revised: 10/16/2015 Document Reviewed: 09/16/2015 Elsevier Interactive Patient Education  2017 [employer].

## 2016-09-07 NOTE — Progress Notes (Signed)
  Echocardiogram 2D Echocardiogram has been performed.  Carrie Watts 09/07/2016, 3:04 PM

## 2016-09-07 NOTE — Progress Notes (Signed)
Advanced Heart Failure Clinic Note   Referring Physician: Dr. Jana Hakim Primary Care: Dorena Dew, FNP Primary Cardiologist: Dr. Haroldine Laws   HPI:  Carrie Watts is a 66 y.o. female with a history of HTN, DM, L breast cancer s/p mastectomy and 5 rounds of chemo with cyclophosphamide,adriamycin and Taxol) in [place] and recent admission for PE and DVT at [facility] (04/24/16-05/03/16)  She flew back from [place] 1st or 2nd week of Jan 2018. Came to ER 1/14 with R sided facial droop and difficulty speaking and sob. Workup done in the ED showed a saddle pulmonary emboli. LE venous doppler showed right LE DVT. Her PE could be related to long flight travel vs. Hypercoagulable state secondary to breast cancer. Ruled out for stroke, MRI/MRA brain unremarkable, echo with bubble study stable, carotid duplex nonacute. Started on  on Lovenox, transitioned to apixaban at discharge. Her EF was 55-60% 04/24/16.  Seen oncology clinic 05/11/16. Patient continued to have dyspnea since discharge with intermittent chest pain. Repeat CTA showed resolving clot burden. Mild cardiomegaly. Borderline, improved RIGHT heart strain (RV/LV 0.9, previously 1.2). Repeat echo 2/18 showed EF 40-45% Mild to moderate global reduction in LV systolic function; grade 1 diastolic dysfunction; mild LVH; mild MR; trace TR with mildly elevated pulmonary pressure. Normal RV function. Cath with very mild CAD  She presents today for HF follow up. She had 2 episodes of chest pain last week, feels like a squeezing sensation underneath her left breast. No associated symptoms of nausea, diaphoresis or SOB. Thinks that it is reflux, she takes Zantac and pain resolves. Has some SOB with walking into clinic, walking around the block. SOB stairs, has 15 steps in her home. Morning BP's are in the 140-150's. She takes her medicine and BP goes down to 120-130's. Does not weight at home regularly, eats a high sodium diet, drinks less than 2L a day. Denies  orthopnea and PND. She took a lasix last week for pedal swelling, did urinate more and swelling resolved.   Cath 05/18/16  Mid RCA lesion, 10 %stenosed.  Mid LAD lesion, 40 %stenosed.  Mid Cx lesion, 10 %stenosed.  The left ventricular ejection fraction is 40-45% by visual estimate.  There is mild left ventricular systolic dysfunction.  Vasospasm noted in the right radial artery.  LV end diastolic pressure is normal, 11 mm Hg.   Echo 04/2016: EF 55-60%, negative bubble study. Echo 05/2016: EF 40-45% Echo 08/2016: EF 50%    Past Medical History:  Diagnosis Date  . Angina of effort ([place])   . Atrial fibrillation ([place])   . Cancer (HCC)    breast  . CHF (congestive heart failure) ([place])   . Deep venous thrombosis ([place])   . Diabetes mellitus without complication ([place])   . Hypercholesterolemia   . Hypertension   . Pulmonary embolism ([place])   . Renal disorder   . Stroke [facility])     Current Outpatient Prescriptions  Medication Sig Dispense Refill  . anastrozole (ARIMIDEX) 1 MG tablet Take 1 tablet (1 mg total) by mouth daily. 30 tablet 1  . atorvastatin (LIPITOR) 20 MG tablet Take 1 tablet (20 mg total) by mouth at bedtime. 30 tablet 2  . carvedilol (COREG) 6.25 MG tablet Take 1 tablet (6.25 mg total) by mouth 2 (two) times daily with a meal. 60 tablet 2  . docusate sodium (COLACE) 100 MG capsule Take 1 capsule (100 mg total) by mouth 2 (two) times daily. 30 capsule 1  . feeding supplement, GLUCERNA SHAKE, ([place]  SHAKE) LIQD Take 237 mLs by mouth 3 (three) times daily between meals. 30 Can 0  . folic acid (FOLVITE) 426 MCG tablet Take 800 mcg by mouth every morning.     [name] glucose monitoring kit (FREESTYLE) monitoring kit 1 each by Does not apply route 4 (four) times daily - after meals and at bedtime. 1 month Diabetic Testing Supplies for QAC-QHS accuchecks.  Diagnosis E11.65. Any brand OK 1 each 1  . hydrocortisone (ANUSOL-HC) 2.5 % rectal cream Place 1 application rectally 2  (two) times daily as needed for hemorrhoids or itching. 30 g 0  . losartan (COZAAR) 50 MG tablet Take 1 tablet (50 mg total) by mouth daily. 30 tablet 2  . meloxicam (MOBIC) 7.5 MG tablet Take 1 tablet (7.5 mg total) by mouth 2 (two) times daily as needed for pain. (Patient not taking: Reported on 09/07/2016) 30 tablet 0  . Menthol, Topical Analgesic, (BIOFREEZE) 4 % GEL Apply 1 application topically 3 (three) times daily as needed. 1 Tube 5  . metFORMIN (GLUCOPHAGE) 1000 MG tablet Take 1 tablet (1,000 mg total) by mouth 2 (two) times daily. 60 tablet 2  . Multiple Vitamin (MULTIVITAMIN WITH MINERALS) TABS tablet Take 1 tablet by mouth every morning.     [name] omeprazole (PRILOSEC) 20 MG capsule Take 20 mg by mouth every morning.     . ondansetron (ZOFRAN) 4 MG tablet Take 1 tablet (4 mg total) by mouth every 8 (eight) hours as needed for nausea or vomiting. 30 tablet 1  . ranitidine (ZANTAC) 300 MG tablet Take 300 mg by mouth at bedtime.    . rivaroxaban (XARELTO) 20 MG TABS tablet Take 1 tablet (20 mg total) by mouth daily with supper. 30 tablet 3   No current facility-administered medications for this encounter.     Allergies  Allergen Reactions  . Dust Mite Extract       Social History   Social History  . Marital status: Divorced    Spouse name: N/A  . Number of children: N/A  . Years of education: N/A   Occupational History  . Not on file.   Social History Main Topics  . Smoking status: Never Smoker  . Smokeless tobacco: Never Used  . Alcohol use No  . Drug use: No  . Sexual activity: Not on file   Other Topics Concern  . Not on file   Social History Narrative  . No narrative on file      Family History  Problem Relation Age of Onset  . Heart attack Mother   . Clotting disorder Neg Hx     There were no vitals filed for this visit. Wt Readings from Last 3 Encounters:  09/07/16 184 lb (83.5 kg)  07/21/16 187 lb (84.8 kg)  06/30/16 187 lb (84.8 kg)     PHYSICAL  EXAM: General:  Well appearing, NAD. Walked into clinic without difficulty.  HEENT:Normal, alopecia Neck: Supple. 6-7 cm JVD. Carotids 2+ bilat; no bruits. No thyromegaly or nodule noted.  Cor: PMI nondisplaced. Regular rate and rhythm. No M/G/R noted.  Lungs: clear bilaterally, normal effort.  Abdomen: soft, NT, ND, no HSM. No bruits or masses. Bowel sounds present in all quadrants  Extremities: no cyanosis, clubbing, rash, 1+ pedal edema.  Neuro: alert & oriented x 3, cranial nerves grossly intact. moves all 4 extremities w/o difficulty. Affect pleasant.   ASSESSMENT & PLAN:  1. Chronic systolic HF: NICM, cath in 05/2016 with minimal CAD. ? Related to adriamycin  versus stress related to massive PE - NYHA II-III - Volume status stable on exam - Continue Coreg 6.110m BID.  - Increase losartan to 720mdaily.  - Continue prn lasix.  - Repeat Echo today- EF 50%, RV ok.  2. Recent saddle PE/DVT - Continue Xarelto full dose for one year. Then, would benefit from chronic therapy with 1026marelto daily or Eliquis 2.5mg16mD. She has increased risk for thromboembolic events with history of breast CA and frequently travels to [place] Will repeat chest CT to assess clot burden per her request.  3. Breast CA    -- Per Oncology. Has completed neo-adjuvant chemo including Adriamycin    4. Hypertension - Increase losartan as above.   Follow up in 3-4 months.   ErinArbutus Leas  2:37 PM   Patient seen and examined with ErinJettie Booze. We discussed all aspects of the encounter. I agree with the assessment and plan as stated above.   Echo reviewed personally EF ~50%. Recent LV dysfunction likely due to massive PE. Doubt adriamycin toxicity. Cath films reviewed and no CAD. There is no RV strain on echo. Will repeat CT to reassess PE burden and resolution. Given breast CA and frequent travels would treat PE for 1 year then switch to Xarelto 10 daily for reduction in recurrent events. Agree with  increasing losartan for HTN.

## 2016-09-07 NOTE — Patient Instructions (Addendum)
Will schedule you for chest CT to evaluate pulmonary embolism. Radiology will call you to schedule.  INCREASE Losartan to 75 mg (1.5 tabs) once daily.  Follow up 3 months with Dr. Haroldine Laws. We will call you closer to this time, or you may call our office to schedule 1 month before you are due to be seen.  Do the following things EVERYDAY: 1) Weigh yourself in the morning before breakfast. Write it down and keep it in a log. 2) Take your medicines as prescribed 3) Eat low salt foods-Limit salt (sodium) to 2000 mg per day.  4) Stay as active as you can everyday 5) Limit all fluids for the day to less than 2 liters

## 2016-09-08 ENCOUNTER — Telehealth: Payer: Self-pay

## 2016-09-08 ENCOUNTER — Other Ambulatory Visit: Payer: Self-pay

## 2016-09-08 ENCOUNTER — Ambulatory Visit (HOSPITAL_BASED_OUTPATIENT_CLINIC_OR_DEPARTMENT_OTHER): Payer: Medicare Other | Admitting: Oncology

## 2016-09-08 ENCOUNTER — Other Ambulatory Visit (HOSPITAL_BASED_OUTPATIENT_CLINIC_OR_DEPARTMENT_OTHER): Payer: Medicare Other

## 2016-09-08 VITALS — BP 127/77 | HR 73 | Temp 98.2°F | Resp 18 | Ht 62.0 in | Wt 184.8 lb

## 2016-09-08 DIAGNOSIS — C50812 Malignant neoplasm of overlapping sites of left female breast: Secondary | ICD-10-CM

## 2016-09-08 DIAGNOSIS — C50912 Malignant neoplasm of unspecified site of left female breast: Secondary | ICD-10-CM

## 2016-09-08 DIAGNOSIS — I2699 Other pulmonary embolism without acute cor pulmonale: Secondary | ICD-10-CM

## 2016-09-08 DIAGNOSIS — Z170 Estrogen receptor positive status [ER+]: Secondary | ICD-10-CM | POA: Diagnosis not present

## 2016-09-08 DIAGNOSIS — C773 Secondary and unspecified malignant neoplasm of axilla and upper limb lymph nodes: Secondary | ICD-10-CM

## 2016-09-08 DIAGNOSIS — C779 Secondary and unspecified malignant neoplasm of lymph node, unspecified: Secondary | ICD-10-CM

## 2016-09-08 DIAGNOSIS — Z9181 History of falling: Secondary | ICD-10-CM | POA: Diagnosis not present

## 2016-09-08 DIAGNOSIS — I639 Cerebral infarction, unspecified: Secondary | ICD-10-CM

## 2016-09-08 DIAGNOSIS — I82401 Acute embolism and thrombosis of unspecified deep veins of right lower extremity: Secondary | ICD-10-CM

## 2016-09-08 DIAGNOSIS — C50112 Malignant neoplasm of central portion of left female breast: Secondary | ICD-10-CM

## 2016-09-08 LAB — COMPLETE METABOLIC PANEL WITH GFR
ALT: 15 U/L (ref 6–29)
AST: 20 U/L (ref 10–35)
Albumin: 4 g/dL (ref 3.6–5.1)
Alkaline Phosphatase: 61 U/L (ref 33–130)
BILIRUBIN TOTAL: 0.5 mg/dL (ref 0.2–1.2)
BUN: 16 mg/dL (ref 7–25)
CALCIUM: 9.8 mg/dL (ref 8.6–10.4)
CO2: 24 mmol/L (ref 20–31)
CREATININE: 1.11 mg/dL — AB (ref 0.50–0.99)
Chloride: 107 mmol/L (ref 98–110)
GFR, EST AFRICAN AMERICAN: 59 mL/min — AB (ref >=60)
GFR, EST NON AFRICAN AMERICAN: 52 mL/min — AB (ref >=60)
Glucose, Bld: 129 mg/dL — ABNORMAL HIGH (ref 65–99)
Potassium: 4.2 mmol/L (ref 3.5–5.3)
Sodium: 143 mmol/L (ref 135–146)
TOTAL PROTEIN: 6.7 g/dL (ref 6.1–8.1)

## 2016-09-08 LAB — CBC WITH DIFFERENTIAL/PLATELET
BASO%: 0.3 % (ref 0.0–2.0)
BASOS PCT: 0 %
Basophils Absolute: 0 cells/uL (ref 0–200)
Basophils Absolute: 0 10*3/uL (ref 0.0–0.1)
EOS%: 4.6 % (ref 0.0–7.0)
Eosinophils Absolute: 396 cells/uL (ref 15–500)
Eosinophils Absolute: 0.3 10*3/uL (ref 0.0–0.5)
Eosinophils Relative: 6 %
HCT: 35.3 % (ref 35.0–45.0)
HEMATOCRIT: 34.5 % — AB (ref 34.8–46.6)
HGB: 11.2 g/dL — ABNORMAL LOW (ref 11.6–15.9)
Hemoglobin: 11.1 g/dL — ABNORMAL LOW (ref 11.7–15.5)
LYMPH#: 1.9 10*3/uL (ref 0.9–3.3)
LYMPH%: 29.4 % (ref 14.0–49.7)
LYMPHS PCT: 23 %
Lymphs Abs: 1518 cells/uL (ref 850–3900)
MCH: 29.4 pg (ref 27.0–33.0)
MCH: 29.6 pg (ref 25.1–34.0)
MCHC: 31.4 g/dL — ABNORMAL LOW (ref 32.0–36.0)
MCHC: 32.5 g/dL (ref 31.5–36.0)
MCV: 93.6 fL (ref 80.0–100.0)
MCV: 91.3 fL (ref 79.5–101.0)
MONO#: 0.3 10*3/uL (ref 0.1–0.9)
MONO%: 3.9 % (ref 0.0–14.0)
MONOS PCT: 4 %
MPV: 9.4 fL (ref 7.5–12.5)
Monocytes Absolute: 264 cells/uL (ref 200–950)
NEUT%: 61.8 % (ref 38.4–76.8)
NEUTROS ABS: 4422 {cells}/uL (ref 1500–7800)
NEUTROS ABS: 4 10*3/uL (ref 1.5–6.5)
Neutrophils Relative %: 67 %
PLATELETS: 259 10*3/uL (ref 140–400)
Platelets: 242 10*3/uL (ref 145–400)
RBC: 3.77 MIL/uL — AB (ref 3.80–5.10)
RBC: 3.78 10*6/uL (ref 3.70–5.45)
RDW: 13.7 % (ref 11.0–15.0)
RDW: 14.1 % (ref 11.2–14.5)
WBC: 6.6 10*3/uL (ref 3.8–10.8)
WBC: 6.5 10*3/uL (ref 3.9–10.3)

## 2016-09-08 LAB — COMPREHENSIVE METABOLIC PANEL
ALK PHOS: 65 U/L (ref 40–150)
ALT: 19 U/L (ref 0–55)
ANION GAP: 11 meq/L (ref 3–11)
AST: 22 U/L (ref 5–34)
Albumin: 3.8 g/dL (ref 3.5–5.0)
BILIRUBIN TOTAL: 0.45 mg/dL (ref 0.20–1.20)
BUN: 13.9 mg/dL (ref 7.0–26.0)
CO2: 27 mEq/L (ref 22–29)
Calcium: 10.1 mg/dL (ref 8.4–10.4)
Chloride: 110 mEq/L — ABNORMAL HIGH (ref 98–109)
Creatinine: 1 mg/dL (ref 0.6–1.1)
EGFR: 64 mL/min/{1.73_m2} — AB (ref >90)
GLUCOSE: 91 mg/dL (ref 70–140)
POTASSIUM: 4.2 meq/L (ref 3.5–5.1)
Sodium: 148 mEq/L — ABNORMAL HIGH (ref 136–145)
TOTAL PROTEIN: 7.2 g/dL (ref 6.4–8.3)

## 2016-09-08 MED ORDER — ATORVASTATIN CALCIUM 20 MG PO TABS: 20.0000 mg | ORAL_TABLET | Freq: Every day | ORAL | 2 refills | Status: DC

## 2016-09-08 NOTE — Progress Notes (Signed)
[place]  [phone] [phone]) [phone]     ID: Carrie Watts DOB: 1951/03/06  MR#: [id number]  QZE#:[id number]  Patient Care Team: Dorena Dew, FNP as PCP - General (Family Medicine) Zenaya Ulatowski, Virgie Dad, MD as Consulting Physician (Oncology) Chauncey Cruel, MD OTHER MD:  CHIEF COMPLAINT: Estrogen receptor positive breast cancer  CURRENT TREATMENT: Anastrozole   BREAST CANCER HISTORY: From the original intake note:  The patient tells me she herself palpated a mass in her left breast and brought it to medical attention. She underwent mammography, ultrasonography, and biopsy and then proceeded to left mastectomy and axillary lymph node dissection at [facility] in [place]. The pathology from this procedure (SFS [id number]) describes a 2.1 cm tumor, grade 2, mixed infiltrating lobular and ductal, involving multiple quadrants. The deep margin was clear. 2 of 15 lymph nodes were involved.  The patient was then treated with what I her report seems to have been doxorubicin and cyclophosphamide given every 3 weeks 4, beginning October and completed December, at which point she was started on what sounds like paclitaxel weekly. According to her account she received 1 dose of paclitaxel 04/06/2016. At that time she moved to this area to be closer to her daughter and to receive further treatment.  Of note, on 04/24/2016 she presented to the emergency room with chest pain and shortness of breath, with a CT angiogram on that date showing a saddle embolus with bilateral lobe wall and segmental pulmonary emboli. Doppler ultrasonography the next day documented a right lower extremity deep venous thrombosis. The patient was started on Lovenox and transitioned to apixaban   Her subsequent history is as detailed below  INTERVAL HISTORY: Carrie Watts returns today for follow-up of her estrogen receptor positive breast cancer accompanied by her son-in-law and  his baby daughter. Saniyya continues on anastrozole. She tolerates this well.  Hot flashes and vaginal dryness are not a major issue. She never developed the arthralgias or myalgias that many patients can experience on this medication. She obtains it at a good price.  She continues to make progress from her stroke. She did have a fall on May 2 and briefly injured her right hand.  Sometimes she feels a little hot sometimes she feels a little cold. She has seen a little bit of blood in her stool namely on the tissue. She tries to exercise chiefly by walking.  REVIEW OF SYSTEMS: She continues on apixaban without significant bleeding complications. A detailed review of systems today was otherwise stable.  PAST MEDICAL HISTORY: Past Medical History:  Diagnosis Date  . Angina of effort ([place])   . Atrial fibrillation ([place])   . Cancer (HCC)    breast  . CHF (congestive heart failure) ([place])   . Deep venous thrombosis ([place])   . Diabetes mellitus without complication ([place])   . Hypercholesterolemia   . Hypertension   . Pulmonary embolism ([place])   . Renal disorder   . Stroke [facility])     PAST SURGICAL HISTORY: Past Surgical History:  Procedure Laterality Date  . BREAST SURGERY    . COLONOSCOPY N/A 05/16/2016   Procedure: COLONOSCOPY;  Surgeon: [name], MD;  Location: [place];  Service: Endoscopy;  Laterality: N/A;  . ESOPHAGOGASTRODUODENOSCOPY N/A 05/16/2016   Procedure: ESOPHAGOGASTRODUODENOSCOPY (EGD);  Surgeon: [name], MD;  Location: [place];  Service: Endoscopy;  Laterality: N/A;  . LEFT HEART CATH AND CORONARY ANGIOGRAPHY N/A 05/18/2016   Procedure: Left Heart Cath and Coronary Angiography;  Surgeon: Jettie Booze, MD;  Location: [place];  Service: Cardiovascular;  Laterality: N/A;  . MASTECTOMY Left 2017  . PARTIAL HYSTERECTOMY      FAMILY HISTORY Family History  Problem Relation Age of Onset  . Heart attack Mother   . Clotting disorder Neg Hx   The  patient's father died from colorectal cancer at age 68. The patient's mother died at age 68 from heart disease. The patient has 3 brothers, 1 sister. The sister was diagnosed with breast cancer in her late 68s. The patient has a niece diagnosed with breast cancer at age 68.  GYNECOLOGIC HISTORY:  No LMP recorded. Patient has had a hysterectomy. Menarche age 68, first live birth age 75. The patient is GX P5. She status post menopause but does not recall the date. She did not take hormone replacement. She never used oral contraceptives.  SOCIAL HISTORY:  The patient used to do gardening. She is originally from [place]. She is divorced. She is currently living with her daughter [name] hearing [place]. [name] is an Therapist, sports. The patient's other children are either in [place] or [place]. The patient has 8 grandchildren. She is a Nurse, learning disability.    ADVANCED DIRECTIVES: In place   HEALTH MAINTENANCE: Social History  Substance Use Topics  . Smoking status: Never Smoker  . Smokeless tobacco: Never Used  . Alcohol use No     Colonoscopy: Never  PAP:  Bone density: Never   Allergies  Allergen Reactions  . Dust Mite Extract     Current Outpatient Prescriptions  Medication Sig Dispense Refill  . anastrozole (ARIMIDEX) 1 MG tablet Take 1 tablet (1 mg total) by mouth daily. 30 tablet 1  . atorvastatin (LIPITOR) 20 MG tablet Take 1 tablet (20 mg total) by mouth at bedtime. 30 tablet 2  . carvedilol (COREG) 6.25 MG tablet Take 1 tablet (6.25 mg total) by mouth 2 (two) times daily with a meal. 60 tablet 2  . docusate sodium (COLACE) 100 MG capsule Take 1 capsule (100 mg total) by mouth 2 (two) times daily. 30 capsule 1  . feeding supplement, GLUCERNA SHAKE, (GLUCERNA SHAKE) LIQD Take 237 mLs by mouth 3 (three) times daily between meals. 30 Can 0  . folic acid (FOLVITE) 829 MCG tablet Take 800 mcg by mouth every morning.     [name] glucose monitoring kit (FREESTYLE) monitoring kit 1 each by Does not  apply route 4 (four) times daily - after meals and at bedtime. 1 month Diabetic Testing Supplies for QAC-QHS accuchecks.  Diagnosis E11.65. Any brand OK 1 each 1  . hydrocortisone (ANUSOL-HC) 2.5 % rectal cream Place 1 application rectally 2 (two) times daily as needed for hemorrhoids or itching. 30 g 0  . losartan (COZAAR) 50 MG tablet Take 1.5 tablets (75 mg total) by mouth daily. 45 tablet 6  . meloxicam (MOBIC) 7.5 MG tablet Take 1 tablet (7.5 mg total) by mouth 2 (two) times daily as needed for pain. 30 tablet 0  . Menthol, Topical Analgesic, (BIOFREEZE) 4 % GEL Apply 1 application topically 3 (three) times daily as needed. 1 Tube 5  . metFORMIN (GLUCOPHAGE) 1000 MG tablet Take 1 tablet (1,000 mg total) by mouth 2 (two) times daily. 60 tablet 2  . Multiple Vitamin (MULTIVITAMIN WITH MINERALS) TABS tablet Take 1 tablet by mouth every morning.     [name] omeprazole (PRILOSEC) 20 MG capsule Take 20 mg by mouth every morning.     . ondansetron (ZOFRAN) 4  MG tablet Take 1 tablet (4 mg total) by mouth every 8 (eight) hours as needed for nausea or vomiting. 30 tablet 1  . ranitidine (ZANTAC) 300 MG tablet Take 300 mg by mouth at bedtime.    . rivaroxaban (XARELTO) 20 MG TABS tablet Take 1 tablet (20 mg total) by mouth daily with supper. 30 tablet 3   No current facility-administered medications for this visit.     OBJECTIVE: Middle-aged African-American woman In no acute distress Vitals:   09/08/16 1156  BP: 127/77  Pulse: 73  Resp: 18  Temp: 98.2 F (36.8 C)     Body mass index is 33.8 kg/m.    ECOG FS:2 - Symptomatic, <50% confined to bed  Sclerae unicteric, EOMs intact Oropharynx clear and moist No cervical or supraclavicular adenopathy Lungs no rales or rhonchi Heart regular rate and rhythm Abd soft, nontender, positive bowel sounds MSK no focal spinal tenderness, no upper extremity lymphedema Neuro: nonfocal, well oriented, speech still slightly labored, positive affect Breasts: The  right breast is unremarkable. The left breast is status post mastectomy with no evidence of chest wall recurrence. Both axillae are benign.  LAB RESULTS:  CMP     Component Value Date/Time   NA 143 09/07/2016 1021   NA 143 06/28/2016 1231   K 4.2 09/07/2016 1021   K 4.1 06/28/2016 1231   CL 107 09/07/2016 1021   CO2 24 09/07/2016 1021   CO2 22 06/28/2016 1231   GLUCOSE 129 (H) 09/07/2016 1021   GLUCOSE 117 06/28/2016 1231   BUN 16 09/07/2016 1021   BUN 19.1 06/28/2016 1231   CREATININE 1.11 (H) 09/07/2016 1021   CREATININE 1.0 06/28/2016 1231   CALCIUM 9.8 09/07/2016 1021   CALCIUM 9.6 06/28/2016 1231   PROT 6.7 09/07/2016 1021   PROT 6.7 06/28/2016 1231   ALBUMIN 4.0 09/07/2016 1021   ALBUMIN 3.4 (L) 06/28/2016 1231   AST 20 09/07/2016 1021   AST 18 06/28/2016 1231   ALT 15 09/07/2016 1021   ALT 17 06/28/2016 1231   ALKPHOS 61 09/07/2016 1021   ALKPHOS 57 06/28/2016 1231   BILITOT 0.5 09/07/2016 1021   BILITOT 0.34 06/28/2016 1231   GFRNONAA 52 (L) 09/07/2016 1021   GFRAA 59 (L) 09/07/2016 1021    INo results found for: SPEP, UPEP  Lab Results  Component Value Date   WBC 6.5 09/08/2016   NEUTROABS 4.0 09/08/2016   HGB 11.2 (L) 09/08/2016   HCT 34.5 (L) 09/08/2016   MCV 91.3 09/08/2016   PLT 242 09/08/2016      Chemistry      Component Value Date/Time   NA 143 09/07/2016 1021   NA 143 06/28/2016 1231   K 4.2 09/07/2016 1021   K 4.1 06/28/2016 1231   CL 107 09/07/2016 1021   CO2 24 09/07/2016 1021   CO2 22 06/28/2016 1231   BUN 16 09/07/2016 1021   BUN 19.1 06/28/2016 1231   CREATININE 1.11 (H) 09/07/2016 1021   CREATININE 1.0 06/28/2016 1231      Component Value Date/Time   CALCIUM 9.8 09/07/2016 1021   CALCIUM 9.6 06/28/2016 1231   ALKPHOS 61 09/07/2016 1021   ALKPHOS 57 06/28/2016 1231   AST 20 09/07/2016 1021   AST 18 06/28/2016 1231   ALT 15 09/07/2016 1021   ALT 17 06/28/2016 1231   BILITOT 0.5 09/07/2016 1021   BILITOT 0.34 06/28/2016  1231       No results found for: LABCA2  No components  found for: BOFBP102  No results for input(s): INR in the last 168 hours.  Urinalysis    Component Value Date/Time   COLORURINE YELLOW 05/27/2016 0853   APPEARANCEUR CLEAR 05/27/2016 0853   LABSPEC 1.010 09/07/2016 1014   PHURINE 5.5 09/07/2016 1014   GLUCOSEU NEGATIVE 09/07/2016 1014   HGBUR TRACE (A) 09/07/2016 1014   BILIRUBINUR NEGATIVE 09/07/2016 1014   KETONESUR NEGATIVE 09/07/2016 1014   PROTEINUR NEGATIVE 09/07/2016 1014   UROBILINOGEN 0.2 09/07/2016 1014   NITRITE NEGATIVE 09/07/2016 1014   LEUKOCYTESUR TRACE (A) 09/07/2016 1014     STUDIES: Mammography with tomography of the right breast at the [facility] 07/13/2016 showed the breast density to be category B. There are no findings suggestive of malignancy.  ELIGIBLE FOR AVAILABLE RESEARCH PROTOCOL: no  ASSESSMENT: 66 y.o. [place] woman originally from [place], status post left mastectomy and sentinel lymph node sampling 07/29/2015 for a pT2 pN1, stage IIB invasive breast cancer, mixed ductal and lobular, strongly estrogen and progesterone receptor positive, HER-2 equivocal  (1) she received adjuvant chemotherapy consisting (most likely) of cyclophosphamide and doxorubicin 4 given between October and December 2017, then started paclitaxel 04/06/2016, discontinued after one dose because of neuropathy and other complications  (2) considered adjuvant radiation, decided against it due to delay  (3) anastrozole started 05/11/2016  (a) bone density 07/13/2016 normal with a T score of -0.2  (4) pulmonary embolus and right lower extremity deep vein thrombosis diagnosed 04/24/2016,  (a) status post enoxaparin 04/24/2016 through 05/03/2016  (b) transitioned to apixaban 05/03/2016  (5) status post stroke with moderate residual  PLAN: AnnaIs now a year out from definitive surgery for her breast cancer with no evidence of disease recurrence. This is  favorable.  She is tolerating anastrozole well and the plan will be to continue that for a total of 5 years.  We reviewed her bone density results which are very favorable.  She is at high fall risk and we discussed that at length today. I placed an order so she may be able to obtain a walking cane.  She is beginning to agitate to get back to [place]. She continues to make steady progress post stroke  She will return to see me in 6 months. She knows to call for any problems that may develop before that visit.   Chauncey Cruel, MD   09/08/2016 12:01 PM Medical Oncology and Hematology [facility] [address] [place], [place] [address] Tel. [phone]    Fax. [phone]

## 2016-09-08 NOTE — Telephone Encounter (Signed)
Refill for [facility]vastatin [facility] we don't prescribe the anastrozole, she will need to get this from Oncology. Thanks!

## 2016-09-09 ENCOUNTER — Ambulatory Visit: Payer: Medicare Other | Admitting: Family Medicine

## 2016-09-09 LAB — URINE CULTURE

## 2016-09-11 NOTE — Progress Notes (Signed)
Patient ID: Carrie Watts, female    DOB: April 23, 1950, 66 y.o.   MRN: [id number]  HPI  Ms. Carrie Watts, a 66 year old female with a history of DMII, hypertension,  DVT, PE, and breast cancer presents accompanied by daughter complaining of yellowing of eyes and rectal bleeding. She says that her daughter noticed that her eyes were slightly yellow on last weekend. She denies shortness of breath, fatigue, dysuria. She endorses rectal bleeding. She noticed blood in stool, primarily on toilet paper.  Patient has associated symptoms of constipation and visible blood: scant. The patient denies abdominal pain, alternating loose stools and constipation, diarrhea and loose stools.  She has not had episodes of rectal bleeding. She is also on chronic anticoagulation therapy.  Past Medical History:  Diagnosis Date  . Angina of effort ([place])   . Atrial fibrillation ([place])   . Cancer (HCC)    breast  . CHF (congestive heart failure) ([place])   . Deep venous thrombosis ([place])   . Diabetes mellitus without complication ([place])   . Hypercholesterolemia   . Hypertension   . Pulmonary embolism ([place])   . Renal disorder   . Stroke [facility])    Social History   Social History  . Marital status: Divorced    Spouse name: N/A  . Number of children: N/A  . Years of education: N/A   Occupational History  . Not on file.   Social History Main Topics  . Smoking status: Never Smoker  . Smokeless tobacco: Never Used  . Alcohol use No  . Drug use: No  . Sexual activity: Not on file   Other Topics Concern  . Not on file   Social History Narrative  . No narrative on file   Immunization History  Administered Date(s) Administered  . Influenza,inj,Quad PF,36+ Mos 05/15/2016  . Pneumococcal Polysaccharide-23 05/15/2016      Review of Systems  Constitutional: Positive for malaise/fatigue. Negative for chills, diaphoresis and fever.  HENT: Negative.   Eyes: Negative.   Respiratory: Positive for shortness of breath  (periodically).   Cardiovascular: Negative.   Gastrointestinal: Positive for blood in stool and constipation.  Skin: Negative.   Neurological: Negative.   Endo/Heme/Allergies: Negative.   Psychiatric/Behavioral: Negative.    Objective:   Physical Exam  Constitutional: She is oriented to person, place, and time. Vital signs are normal.  HENT:  Head: Normocephalic and atraumatic.  Eyes: Conjunctivae are normal. No scleral icterus.  Neck: Trachea normal.  Cardiovascular: Normal rate and regular rhythm.   Pulmonary/Chest: Effort normal and breath sounds normal.  Abdominal: Soft. Normal appearance. There is no tenderness.  Genitourinary: Rectal exam shows external hemorrhoid.  Neurological: She is oriented to person, place, and time.  Skin: Skin is warm, dry and intact.       BP 117/74 (BP Location: Right Arm, Patient Position: Sitting, Cuff Size: Large)   Pulse 70   Temp 98.3 F (36.8 C) (Oral)   Resp 16   Ht 5\' 2"  (1.575 m)   Wt 184 lb (83.5 kg)   SpO2 100%   BMI 33.65 kg/m  Assessment & Plan:  1. Hemorrhoids, unspecified hemorrhoid type Occult blood negative. Discussed blood in stool at length. If bleeding persists, patient may warrant a GI visit. Last colonoscopy was in February 2018. I suspect that hemorrhoids are due to pressure from straining. Will add a daily stool softener to medication regimen. She was also reminded to increase daily fiber intake and water. Reminded to avoid long periods  of sitting and go to toilet immediately when she feels the urge to defecate.    - IFOBT POC (occult bld, rslt in office); Future - hydrocortisone (ANUSOL-HC) 2.5 % rectal cream; Place 1 application rectally 2 (two) times daily as needed for hemorrhoids or itching.  Dispense: 30 g; Refill: 0  2. Constipation, unspecified constipation type A high fiber diet with plenty of fluids (up to 8 glasses of water daily) is suggested to relieve these symptoms.   - docusate sodium (COLACE) 100 MG  capsule; Take 1 capsule (100 mg total) by mouth 2 (two) times daily.  Dispense: 30 capsule; Refill: 1  3. Dyspnea on exertion - COMPLETE METABOLIC PANEL WITH GFR - CBC with Differential  4. Congestive heart failure, unspecified HF chronicity, unspecified heart failure type [facility]) Patient is scheduled for a 2 D echocardiogram this afternoon. I will defer to cardiology.   5. Urine leukocytes - Urine culture  6. Diabetes mellitus without complication ([place]) - POCT urinalysis dip (device) - HgB A1c   Donia Pounds  MSN, FNP-C [facility] [address] [place], [place] [address] [phone]

## 2016-09-12 ENCOUNTER — Telehealth: Payer: Self-pay

## 2016-09-12 NOTE — Addendum Note (Signed)
Addendum  created 09/12/16 1102 by Shellee Streng, MD   Sign clinical note    

## 2016-09-12 NOTE — Telephone Encounter (Signed)
Patient returned call, she states she is feeling better and has had no rectal bleeding. I advised her to keep her next appointment and call if she needs something before then. Thanks!

## 2016-09-12 NOTE — Telephone Encounter (Signed)
Called, no answer and left message for patient to call back and let us know how she is doing. Thanks!

## 2016-09-12 NOTE — Telephone Encounter (Signed)
-----   Message from Dorena Dew, FNP sent at 09/11/2016  8:50 PM EDT ----- Please call Ms. Nawabi to inquire about rectal bleeding. Have symptoms improved?  Thanks

## 2016-09-14 ENCOUNTER — Telehealth: Payer: Self-pay

## 2016-09-15 ENCOUNTER — Other Ambulatory Visit: Payer: Self-pay | Admitting: Family Medicine

## 2016-09-15 DIAGNOSIS — K649 Unspecified hemorrhoids: Secondary | ICD-10-CM

## 2016-09-15 DIAGNOSIS — K5909 Other constipation: Secondary | ICD-10-CM

## 2016-09-15 NOTE — Telephone Encounter (Signed)
Called, daughter was asking about gasto referral. I have send message to provider regarding this. Thanks!

## 2016-09-16 ENCOUNTER — Other Ambulatory Visit (HOSPITAL_COMMUNITY): Payer: Self-pay | Admitting: Internal Medicine

## 2016-09-16 ENCOUNTER — Encounter: Payer: Self-pay | Admitting: Physician Assistant

## 2016-09-16 ENCOUNTER — Telehealth: Payer: Self-pay

## 2016-09-16 ENCOUNTER — Ambulatory Visit (HOSPITAL_COMMUNITY)
Admission: RE | Admit: 2016-09-16 | Discharge: 2016-09-16 | Disposition: A | Discharge status: Home / Self Care | Payer: Medicare Other | Source: Ambulatory Visit | Attending: Internal Medicine | Admitting: Internal Medicine

## 2016-09-16 DIAGNOSIS — I2699 Other pulmonary embolism without acute cor pulmonale: Secondary | ICD-10-CM | POA: Diagnosis present

## 2016-09-16 DIAGNOSIS — R918 Other nonspecific abnormal finding of lung field: Secondary | ICD-10-CM | POA: Insufficient documentation

## 2016-09-16 DIAGNOSIS — Z86711 Personal history of pulmonary embolism: Secondary | ICD-10-CM | POA: Diagnosis not present

## 2016-09-16 DIAGNOSIS — R079 Chest pain, unspecified: Secondary | ICD-10-CM | POA: Diagnosis not present

## 2016-09-16 DIAGNOSIS — K769 Liver disease, unspecified: Secondary | ICD-10-CM | POA: Diagnosis not present

## 2016-09-16 MED ORDER — LOSARTAN POTASSIUM 50 MG PO TABS: 75.0000 mg | ORAL_TABLET | Freq: Every day | ORAL | 1 refills | Status: DC

## 2016-09-16 MED ORDER — CARVEDILOL 6.25 MG PO TABS: 6.2500 mg | ORAL_TABLET | Freq: Two times a day (BID) | ORAL | 1 refills | Status: DC

## 2016-09-16 MED ORDER — ATORVASTATIN CALCIUM 20 MG PO TABS: 20.0000 mg | ORAL_TABLET | Freq: Every day | ORAL | 1 refills | Status: DC

## 2016-09-16 MED ORDER — METFORMIN HCL 1000 MG PO TABS: 1000.0000 mg | ORAL_TABLET | Freq: Two times a day (BID) | ORAL | 1 refills | Status: DC

## 2016-09-16 MED ORDER — IOPAMIDOL (ISOVUE-370) INJECTION 76%
INTRAVENOUS | Status: AC
  Administered 2016-09-16: 100 mL
  Filled 2016-09-16: qty 100

## 2016-09-16 MED ORDER — RIVAROXABAN 20 MG PO TABS: 20.0000 mg | ORAL_TABLET | Freq: Every day | ORAL | 1 refills | Status: DC

## 2016-09-16 MED FILL — ANASTROZOLE 1 MG TABLET: 1 | 90 days supply | Qty: 90 | Fill #0 | Status: TO

## 2016-09-16 NOTE — Telephone Encounter (Signed)
REFILLS ON MEDICATIONS SENT INTO PHARMACY FOR 90 DAY SUPPLY, PATIENT WILL BE OUT OF THE COUNTRY FOR OVER 2 MONTHS. THANKS!

## 2016-09-19 MED FILL — LOSARTAN POTASSIUM 50 MG TA: 50 | 90 days supply | Qty: 135 | Fill #0 | Status: TO

## 2016-09-19 MED FILL — metFORMIN HCL 1000 MG TABS: 1000 | 90 days supply | Qty: 180 | Fill #0 | Status: TO

## 2016-09-19 MED FILL — CARVEDILOL 6.25 MG TABLET: 6.25 | 90 days supply | Qty: 180 | Fill #0 | Status: TO

## 2016-09-19 MED FILL — ATORVASTATIN 20 MG TABLET: 20 | 90 days supply | Qty: 90 | Fill #0 | Status: TO

## 2016-09-19 MED FILL — XARELTO 20 MG TABLET: 20 | 90 days supply | Qty: 90 | Fill #0 | Status: TO

## 2016-09-23 ENCOUNTER — Other Ambulatory Visit: Payer: Self-pay

## 2016-09-23 ENCOUNTER — Ambulatory Visit (INDEPENDENT_AMBULATORY_CARE_PROVIDER_SITE_OTHER): Payer: Medicare Other | Admitting: Physician Assistant

## 2016-09-23 ENCOUNTER — Encounter: Payer: Self-pay | Admitting: Physician Assistant

## 2016-09-23 VITALS — BP 120/70 | Ht 62.0 in | Wt 184.0 lb

## 2016-09-23 DIAGNOSIS — R1013 Epigastric pain: Secondary | ICD-10-CM

## 2016-09-23 DIAGNOSIS — K625 Hemorrhage of anus and rectum: Secondary | ICD-10-CM | POA: Diagnosis not present

## 2016-09-23 DIAGNOSIS — I639 Cerebral infarction, unspecified: Secondary | ICD-10-CM

## 2016-09-23 DIAGNOSIS — K648 Other hemorrhoids: Secondary | ICD-10-CM

## 2016-09-23 MED ORDER — HYDROCORTISONE ACETATE 25 MG RE SUPP: 25.0000 mg | Freq: Two times a day (BID) | RECTAL | 1 refills | Status: DC

## 2016-09-23 MED ORDER — TRUEPLUS LANCETS 28G MISC: 1.0000 | Freq: Two times a day (BID) | 3 refills | Status: DC

## 2016-09-23 MED ORDER — GLUCOSE BLOOD VI STRP: ORAL_STRIP | 12 refills | Status: DC

## 2016-09-23 NOTE — Patient Instructions (Addendum)
If you are age 68 or older, your body mass index should be between 23-30. Your Body mass index is 33.65 kg/m. If this is out of the aforementioned range listed, please consider follow up with your Primary Care Provider.  If you are age 70 or younger, your body mass index should be between 19-25. Your Body mass index is 33.65 kg/m. If this is out of the aformentioned range listed, please consider follow up with your Primary Care Provider.   We have sent the following medications to your pharmacy for you to pick up at your convenience: Hydrocortisone Suppositories.  If above too expensive, use your hydrocortisone ointment inserted rectally up to level of knuckle of pinky finger.  Follow up with Dr. Ardis Hughs as needed.  Thank you for choosing me and [facility].   Ellouise Newer, PA-C

## 2016-09-23 NOTE — Progress Notes (Signed)
Chief Complaint: Nausea and vomiting, rectal bleeding  HPI:  Carrie Watts is a 66 year old female with a past medical history of A. Fib maintained on Xarelto (echo 09/07/16 with LVEF 55%-60%), breast cancer currently on chemotherapy, CHF, DVT, diabetes, hypertension, pulmonary embolism and stroke, who was referred to me by Carrie Dew, FNP for a complaint of nausea and vomiting as well as rectal bleeding .     Patient was evaluated at [facility] by Dr. Ardis Hughs in 05/16/16 with EGD and colonoscopy. EGD was completely normal to the duodenum. Colonoscopy revealed one 3 mm polyp in the ascending colon, diverticulosis in the left colon and was otherwise normal. She was assigned to Dr. Fuller Plan at that time who did the initial consult.   Today, the patient is a significant clinic and tells me that currently she is on chemotherapy she will have intermittent nausea and vomiting which she relates to her "breast cancer pills". She does have this but it is not every day but maybe once a week and typically after eating food, it does not matter what the food is. It always seems to be around the time that she has taken her medication. Patient does use Zofran 4 mg at those times she relieves her symptoms.   Patient also complains today that she has seen some bright red blood on the toilet paper when wiping. She tells me that this is "very intermittent". Sometimes this only occurs once a month. Patient tells me that typically this goes along with days where she has excessive diarrhea due to her diet or is slightly constipated. Patient does use stool softeners as needed. Patient tells me that sometimes this is a little bit more blood than others. This has not occurred very recently. Patient tells me she went to see her PCP a week or 2 ago and was diagnosed with a thrombosed internal hemorrhoid. She tells me this is new for her as she was never told she had hemorrhoids before. She has been using hydrocortisone ointment  around the outside of her rectum as needed.   Patient denies fever, chills, melena, weight loss, fatigue, anorexia, heartburn, reflux, abdominal pain, rectal pain or symptoms that awaken her at night.  Past Medical History:  Diagnosis Date  . Angina of effort ([place])   . Atrial fibrillation ([place])   . Cancer (HCC)    breast  . CHF (congestive heart failure) ([place])   . Deep venous thrombosis ([place])   . Diabetes mellitus without complication ([place])   . Hypercholesterolemia   . Hypertension   . Pulmonary embolism ([place])   . Renal disorder   . Stroke [facility])     Past Surgical History:  Procedure Laterality Date  . BREAST SURGERY    . COLONOSCOPY N/A 05/16/2016   Procedure: COLONOSCOPY;  Surgeon: [name], MD;  Location: [place];  Service: Endoscopy;  Laterality: N/A;  . ESOPHAGOGASTRODUODENOSCOPY N/A 05/16/2016   Procedure: ESOPHAGOGASTRODUODENOSCOPY (EGD);  Surgeon: [name], MD;  Location: [place];  Service: Endoscopy;  Laterality: N/A;  . LEFT HEART CATH AND CORONARY ANGIOGRAPHY N/A 05/18/2016   Procedure: Left Heart Cath and Coronary Angiography;  Surgeon: Jettie Booze, MD;  Location: [place];  Service: Cardiovascular;  Laterality: N/A;  . MASTECTOMY Left 2017  . PARTIAL HYSTERECTOMY      Current Outpatient Prescriptions  Medication Sig Dispense Refill  . anastrozole (ARIMIDEX) 1 MG tablet Take 1 tablet (1 mg total) by mouth daily. 30 tablet 1  .  atorvastatin (LIPITOR) 20 MG tablet Take 1 tablet (20 mg total) by mouth at bedtime. 90 tablet 1  . carvedilol (COREG) 6.25 MG tablet Take 1 tablet (6.25 mg total) by mouth 2 (two) times daily with a meal. 180 tablet 1  . docusate sodium (COLACE) 100 MG capsule Take 1 capsule (100 mg total) by mouth 2 (two) times daily. 30 capsule 1  . feeding supplement, GLUCERNA SHAKE, (GLUCERNA SHAKE) LIQD Take 237 mLs by mouth 3 (three) times daily between meals. 30 Can 0  . folic acid (FOLVITE) 568 MCG tablet Take 800  mcg by mouth every morning.     [name] glucose blood (TRUE METRIX BLOOD GLUCOSE TEST) test strip Use as instructed 100 each 12  . glucose monitoring kit (FREESTYLE) monitoring kit 1 each by Does not apply route 4 (four) times daily - after meals and at bedtime. 1 month Diabetic Testing Supplies for QAC-QHS accuchecks.  Diagnosis E11.65. Any brand OK 1 each 1  . hydrocortisone (ANUSOL-HC) 2.5 % rectal cream Place 1 application rectally 2 (two) times daily as needed for hemorrhoids or itching. 30 g 0  . hydrocortisone (ANUSOL-HC) 25 MG suppository Place 1 suppository (25 mg total) rectally 2 (two) times daily. Use for 7 days 14 suppository 1  . losartan (COZAAR) 50 MG tablet Take 1.5 tablets (75 mg total) by mouth daily. 135 tablet 1  . meloxicam (MOBIC) 7.5 MG tablet Take 1 tablet (7.5 mg total) by mouth 2 (two) times daily as needed for pain. 30 tablet 0  . Menthol, Topical Analgesic, (BIOFREEZE) 4 % GEL Apply 1 application topically 3 (three) times daily as needed. 1 Tube 5  . metFORMIN (GLUCOPHAGE) 1000 MG tablet Take 1 tablet (1,000 mg total) by mouth 2 (two) times daily. 180 tablet 1  . Multiple Vitamin (MULTIVITAMIN WITH MINERALS) TABS tablet Take 1 tablet by mouth every morning.     [name] omeprazole (PRILOSEC) 20 MG capsule Take 20 mg by mouth every morning.     . ondansetron (ZOFRAN) 4 MG tablet Take 1 tablet (4 mg total) by mouth every 8 (eight) hours as needed for nausea or vomiting. 30 tablet 1  . ranitidine (ZANTAC) 300 MG tablet Take 300 mg by mouth at bedtime.    . rivaroxaban (XARELTO) 20 MG TABS tablet Take 1 tablet (20 mg total) by mouth daily with supper. 90 tablet 1  . TRUEPLUS LANCETS 28G MISC 1 each by Does not apply route 2 (two) times daily. 100 each 3   No current facility-administered medications for this visit.     Allergies as of 09/23/2016 - Review Complete 09/23/2016  Allergen Reaction Noted  . Dust mite extract  04/24/2016    Family History  Problem Relation Age of Onset   . Heart attack Mother   . Clotting disorder Neg Hx     Social History   Social History  . Marital status: Divorced    Spouse name: N/A  . Number of children: N/A  . Years of education: N/A   Occupational History  . Not on file.   Social History Main Topics  . Smoking status: Never Smoker  . Smokeless tobacco: Never Used  . Alcohol use No  . Drug use: No  . Sexual activity: Not on file   Other Topics Concern  . Not on file   Social History Narrative  . No narrative on file    Review of Systems:    Constitutional: No fever or chills Skin: No rash Cardiovascular:  No chest pain Respiratory: No SOB  Gastrointestinal: See HPI and otherwise negative Genitourinary: No dysuria Neurological: No headache, dizziness or syncope Musculoskeletal: No new muscle or joint pain Hematologic: No bruising Psychiatric: No history of depression or anxiety   Physical Exam:  Vital signs: BP 120/70   Ht '5\' 2"'$  (1.575 m)   Wt 184 lb (83.5 kg)   BMI 33.65 kg/m    Constitutional:   Pleasant African American female appears to be in NAD, Well developed, Well nourished, alert and cooperative Head:  Normocephalic and atraumatic. Eyes:   PEERL, EOMI. No icterus. Conjunctiva pink. Ears:  Normal auditory acuity. Neck:  Supple Throat: Oral cavity and pharynx without inflammation, swelling or lesion.  Respiratory: Respirations even and unlabored. Lungs clear to auscultation bilaterally.   No wheezes, crackles, or rhonchi.  Cardiovascular: Normal S1, S2. No MRG. Regular rate and rhythm. No peripheral edema, cyanosis or pallor.  Gastrointestinal:  Obese, Soft, nondistended, nontender. No rebound or guarding. Normal bowel sounds. No appreciable masses or hepatomegaly. Rectal:  External: hemorrhoid tag; Anoscopy: Inflamed internal hemorrhoids in all3 columns with stigmata of recent bleeding Msk:  Symmetrical without gross deformities. Without edema, no deformity or joint abnormality.  Neurologic:   Alert and  oriented x4;  grossly normal neurologically.  Skin:   Dry and intact without significant lesions or rashes. Psychiatric: Demonstrates good judgement and reason without abnormal affect or behaviors.  MOST RECENT LABS AND IMAGING: CBC    Component Value Date/Time   WBC 6.5 09/08/2016 1136   WBC 6.6 09/07/2016 1021   RBC 3.78 09/08/2016 1136   RBC 3.77 (L) 09/07/2016 1021   HGB 11.2 (L) 09/08/2016 1136   HCT 34.5 (L) 09/08/2016 1136   PLT 242 09/08/2016 1136   MCV 91.3 09/08/2016 1136   MCH 29.6 09/08/2016 1136   MCH 29.4 09/07/2016 1021   MCHC 32.5 09/08/2016 1136   MCHC 31.4 (L) 09/07/2016 1021   RDW 14.1 09/08/2016 1136   LYMPHSABS 1.9 09/08/2016 1136   MONOABS 0.3 09/08/2016 1136   EOSABS 0.3 09/08/2016 1136   BASOSABS 0.0 09/08/2016 1136    CMP     Component Value Date/Time   NA 148 (H) 09/08/2016 1136   K 4.2 09/08/2016 1136   CL 107 09/07/2016 1021   CO2 27 09/08/2016 1136   GLUCOSE 91 09/08/2016 1136   BUN 13.9 09/08/2016 1136   CREATININE 1.0 09/08/2016 1136   CALCIUM 10.1 09/08/2016 1136   PROT 7.2 09/08/2016 1136   ALBUMIN 3.8 09/08/2016 1136   AST 22 09/08/2016 1136   ALT 19 09/08/2016 1136   ALKPHOS 65 09/08/2016 1136   BILITOT 0.45 09/08/2016 1136   GFRNONAA 52 (L) 09/07/2016 1021   GFRAA 59 (L) 09/07/2016 1021    Assessment: 1. Nausea and vomiting: Intermittent, typically with chemotherapy pills, controlled with Zofran 4 mg patient on Omeprazole 20 mg daily and Ranitidine 300 mg at night; most likely related to chemotherapy with recent EGD being normal on February 2. Constipation: Intermittently, resolved with when necessary Colace 3. Rectal bleeding: Very intermittently over the past few years, typically related to either constipation or diarrhea, bright red blood on the toilet paper, internal hemorrhoids at time of anoscopy today inflamed with stigmata of recent bleeding   Plan: 1. It was discussed today that patient just had recent  evaluation with EGD and colonoscopy in February of this year. It is likely that her nausea and vomiting is related to her current chemotherapy. Would recommend that she continue  Zofran 4 mg when necessary as this seems very intermittent. 2. Discussed patient's rectal bleeding today, rectal exam revealed inflamed internal hemorrhoids on anoscopy. I would recommend the patient start suppositories. 3. Prescribed Anusol suppositories 25 mg twice a day 7 days with one refill. 4. Did discuss that if the above is too expensive for the patient she can continue her hydrocortisone ointment twice a day inserted rectally with a gloved finger 5. Again did discuss that it is important that she tries to keep her bowel movements as regular as possible. 6. Patient to continue her Omeprazole 20 mg once daily, Ranitidine 300 mg at night. 7. Patient to follow in clinic as needed with Dr. Fuller Plan or myself.  Carrie Newer, PA-C [facility] 09/23/2016, 2:24 PM  Cc: Carrie Dew, FNP

## 2016-09-23 NOTE — Telephone Encounter (Signed)
Patient needed an rx for test strips and lancets for her true metrix machine. I have sent these in. Thanks!

## 2016-09-25 NOTE — Progress Notes (Signed)
Reviewed and agree with management plan.  Yoltzin Barg T. Quashawn Jewkes, MD FACG 

## 2016-09-28 ENCOUNTER — Ambulatory Visit: Payer: Medicare Other | Admitting: Oncology

## 2016-09-28 ENCOUNTER — Other Ambulatory Visit: Payer: Medicare Other

## 2016-10-07 ENCOUNTER — Telehealth (HOSPITAL_COMMUNITY): Payer: Self-pay | Admitting: *Deleted

## 2016-10-07 ENCOUNTER — Encounter (HOSPITAL_COMMUNITY): Payer: Self-pay | Admitting: *Deleted

## 2016-10-07 NOTE — Telephone Encounter (Signed)
CT ANGIO CHEST PE W OR WO CONTRAST  Order: [id number]  Status:  Final result  Visible to patient:  Yes (MyChart)  Dx:  Other pulmonary embolism without acut...  Notes recorded by Kennieth Rad, RN on 10/07/2016 at 2:08 PM EDT Called and left another VM, will mail letter today. ------  Notes recorded by Kennieth Rad, RN on 10/03/2016 at 1:29 PM EDT Called and left message asking for her to call us back. ------  Notes recorded by Kennieth Rad, RN on 09/30/2016 at 4:09 PM EDT Called and left message for her to call us back. ------  Notes recorded by Scarlette Calico, RN on 09/28/2016 at 1:51 PM EDT Left message to call back ------  Notes recorded by Bensimhon, Shaune Pascal, MD on 09/18/2016 at 3:45 PM EDT PE has resolved. Please let them know.

## 2016-10-10 NOTE — Telephone Encounter (Signed)
Patient called back and left message on triage line regarding results.  I called patient back but had to leave message for her to call us back.

## 2016-10-20 ENCOUNTER — Ambulatory Visit: Payer: Medicare Other | Admitting: Family Medicine

## 2016-10-21 DIAGNOSIS — H2513 Age-related nuclear cataract, bilateral: Secondary | ICD-10-CM | POA: Diagnosis not present

## 2016-10-21 DIAGNOSIS — E119 Type 2 diabetes mellitus without complications: Secondary | ICD-10-CM | POA: Diagnosis not present

## 2016-11-11 NOTE — Progress Notes (Signed)
PT Progress Note Addendum for G-Codes    14-Jun-2016 1517  PT G-Codes **NOT FOR INPATIENT CLASS**  Functional Assessment Tool Used Clinical judgement  Functional Assessment Tool Used (Outpatient Only) clinical judgement  Functional Limitation Mobility: Walking and moving around  Mobility: Walking and Moving Around Current Status [phone]) CI  Mobility: Walking and Moving Around Goal Status [phone]) [place], [place], DPT [phone]

## 2016-11-21 NOTE — Progress Notes (Signed)
Physical Therapy Note  These are the G-codes associated with the PT eval on 29-Apr-2016.    April 29, 2016 1650  PT G-Codes **NOT FOR INPATIENT CLASS**  Functional Assessment Tool Used Clinical judgement  Functional Limitation Mobility: Walking and moving around  Mobility: Walking and Moving Around Current Status (U6333) CK  Mobility: Walking and Moving Around Goal Status (L4562) CI    Kittie Plater, PT, DPT Pager #: [phone] Office #: [phone]

## 2016-11-29 NOTE — Progress Notes (Signed)
SLP Note (Late Entry):    04/24/16 0900  SLP G-Codes **NOT FOR INPATIENT CLASS**  Functional Assessment Tool Used skilled clinical judgment  Functional Limitations Swallowing  Swallow Current Status (K3524) CI  Swallow Goal Status (E1859) CI  SLP Evaluations  $ SLP Speech Visit 1 Procedure  SLP Evaluations  $BSS Swallow 1 Procedure    Note entered on behalf of Arvil Chaco, [place]   Germain Osgood, M.A. CCC-SLP [phone]

## 2016-12-19 ENCOUNTER — Telehealth: Payer: Self-pay

## 2016-12-19 MED ORDER — RIVAROXABAN 20 MG PO TABS: 20.0000 mg | ORAL_TABLET | Freq: Every day | ORAL | 1 refills | Status: DC

## 2016-12-19 MED ORDER — CARVEDILOL 6.25 MG PO TABS: 6.2500 mg | ORAL_TABLET | Freq: Two times a day (BID) | ORAL | 1 refills | Status: DC

## 2016-12-19 MED ORDER — ATORVASTATIN CALCIUM 20 MG PO TABS: 20.0000 mg | ORAL_TABLET | Freq: Every day | ORAL | 1 refills | Status: DC

## 2016-12-19 MED ORDER — METFORMIN HCL 1000 MG PO TABS: 1000.0000 mg | ORAL_TABLET | Freq: Two times a day (BID) | ORAL | 1 refills | Status: DC

## 2016-12-19 MED ORDER — LOSARTAN POTASSIUM 50 MG PO TABS: 75.0000 mg | ORAL_TABLET | Freq: Every day | ORAL | 1 refills | Status: DC

## 2016-12-19 NOTE — Telephone Encounter (Signed)
Refilled all medications except for the anastrozole which is prescribed by oncology. Called and spoke to patients daughter and advised her that she will need to contact oncology. Thanks!

## 2016-12-20 ENCOUNTER — Other Ambulatory Visit (HOSPITAL_BASED_OUTPATIENT_CLINIC_OR_DEPARTMENT_OTHER): Payer: Medicare Other

## 2016-12-20 ENCOUNTER — Telehealth: Payer: Self-pay | Admitting: Oncology

## 2016-12-20 ENCOUNTER — Ambulatory Visit (HOSPITAL_BASED_OUTPATIENT_CLINIC_OR_DEPARTMENT_OTHER): Payer: Medicare Other | Admitting: Oncology

## 2016-12-20 VITALS — BP 160/78 | HR 62 | Temp 97.9°F | Resp 18 | Ht 62.0 in | Wt 188.2 lb

## 2016-12-20 DIAGNOSIS — Z8673 Personal history of transient ischemic attack (TIA), and cerebral infarction without residual deficits: Secondary | ICD-10-CM

## 2016-12-20 DIAGNOSIS — I82401 Acute embolism and thrombosis of unspecified deep veins of right lower extremity: Secondary | ICD-10-CM

## 2016-12-20 DIAGNOSIS — I2699 Other pulmonary embolism without acute cor pulmonale: Secondary | ICD-10-CM

## 2016-12-20 DIAGNOSIS — C773 Secondary and unspecified malignant neoplasm of axilla and upper limb lymph nodes: Secondary | ICD-10-CM

## 2016-12-20 DIAGNOSIS — Z170 Estrogen receptor positive status [ER+]: Principal | ICD-10-CM

## 2016-12-20 DIAGNOSIS — C50912 Malignant neoplasm of unspecified site of left female breast: Secondary | ICD-10-CM

## 2016-12-20 DIAGNOSIS — C779 Secondary and unspecified malignant neoplasm of lymph node, unspecified: Secondary | ICD-10-CM

## 2016-12-20 DIAGNOSIS — C50112 Malignant neoplasm of central portion of left female breast: Secondary | ICD-10-CM | POA: Diagnosis not present

## 2016-12-20 DIAGNOSIS — N951 Menopausal and female climacteric states: Secondary | ICD-10-CM

## 2016-12-20 DIAGNOSIS — C50812 Malignant neoplasm of overlapping sites of left female breast: Secondary | ICD-10-CM | POA: Diagnosis not present

## 2016-12-20 LAB — COMPREHENSIVE METABOLIC PANEL
ALT: 18 U/L (ref 0–55)
AST: 18 U/L (ref 5–34)
Albumin: 3.5 g/dL (ref 3.5–5.0)
Alkaline Phosphatase: 56 U/L (ref 40–150)
Anion Gap: 7 mEq/L (ref 3–11)
BUN: 17 mg/dL (ref 7.0–26.0)
CO2: 24 mEq/L (ref 22–29)
Calcium: 9.8 mg/dL (ref 8.4–10.4)
Chloride: 109 mEq/L (ref 98–109)
Creatinine: 1.1 mg/dL (ref 0.6–1.1)
EGFR: 57 mL/min/{1.73_m2} — ABNORMAL LOW (ref >90)
Glucose: 140 mg/dl (ref 70–140)
Potassium: 4.4 mEq/L (ref 3.5–5.1)
Sodium: 141 mEq/L (ref 136–145)
Total Bilirubin: 0.52 mg/dL (ref 0.20–1.20)
Total Protein: 6.9 g/dL (ref 6.4–8.3)

## 2016-12-20 LAB — CBC WITH DIFFERENTIAL/PLATELET
BASO%: 0.2 % (ref 0.0–2.0)
BASOS ABS: 0 10*3/uL (ref 0.0–0.1)
EOS ABS: 0.2 10*3/uL (ref 0.0–0.5)
EOS%: 4.6 % (ref 0.0–7.0)
HCT: 35.7 % (ref 34.8–46.6)
HGB: 11.9 g/dL (ref 11.6–15.9)
LYMPH%: 29.7 % (ref 14.0–49.7)
MCH: 29.8 pg (ref 25.1–34.0)
MCHC: 33.3 g/dL (ref 31.5–36.0)
MCV: 89.6 fL (ref 79.5–101.0)
MONO#: 0.3 10*3/uL (ref 0.1–0.9)
MONO%: 5.8 % (ref 0.0–14.0)
NEUT#: 3.1 10*3/uL (ref 1.5–6.5)
NEUT%: 59.7 % (ref 38.4–76.8)
Platelets: 226 10*3/uL (ref 145–400)
RBC: 3.99 10*6/uL (ref 3.70–5.45)
RDW: 14.9 % — ABNORMAL HIGH (ref 11.2–14.5)
WBC: 5.2 10*3/uL (ref 3.9–10.3)
lymph#: 1.5 10*3/uL (ref 0.9–3.3)

## 2016-12-20 NOTE — Telephone Encounter (Signed)
Gave patient avs and calendar with appts.  °

## 2016-12-20 NOTE — Progress Notes (Signed)
[facility]  [phone] [phone]     ID: Carmelite Violet DOB: 1950/09/15  MR#: [id number]  PFR#:[id number]  Patient Care Team: Patient, No Pcp Per as PCP - General (General Practice) Magrinat, Valentino Hue, MD as Consulting Physician (Oncology) Lowella Dell, MD OTHER MD:  CHIEF COMPLAINT: Estrogen receptor positive breast cancer  CURRENT TREATMENT: Anastrozole   BREAST CANCER HISTORY: From the original intake note:  The patient tells me she herself palpated a mass in her left breast and brought it to medical attention. She underwent mammography, ultrasonography, and biopsy and then proceeded to left mastectomy and axillary lymph node dissection at [facility] in [place]. The pathology from this procedure (SFS [id number]) describes a 2.1 cm tumor, grade 2, mixed infiltrating lobular and ductal, involving multiple quadrants. The deep margin was clear. 2 of 15 lymph nodes were involved.  The patient was then treated with what I her report seems to have been doxorubicin and cyclophosphamide given every 3 weeks 4, beginning October and completed December, at which point she was started on what sounds like paclitaxel weekly. According to her account she received 1 dose of paclitaxel 04/06/2016. At that time she moved to this area to be closer to her daughter and to receive further treatment.  Of note, on 04/24/2016 she presented to the emergency room with chest pain and shortness of breath, with a CT angiogram on that date showing a saddle embolus with bilateral lobe wall and segmental pulmonary emboli. Doppler ultrasonography the next day documented a right lower extremity deep venous thrombosis. The patient was started on Lovenox and transitioned to apixaban   Her subsequent history is as detailed below  INTERVAL HISTORY: Carrie Watts returns today for follow-up and treatment of her estrogen receptor positive breast cancer. She continues on  anastrozole,  with some hot flashes, and chills. She notes that the hot flashes never wake her from her sleep. She states that she receives her anastrozole through Medicaid.   She brought in a log of her recent blood pressures for review. Pt brought in her Medical Records from her previous physicians in [place] for review. She has been visit with a grandchild who is 67 year old and another grandchild on the way. She states that her birthday is upcoming.    REVIEW OF SYSTEMS: She continues on apixaban without significant bleeding complications. She reports associated dizziness, intermittent, progressive. A detailed review of systems today was otherwise stable.  PAST MEDICAL HISTORY: Past Medical History:  Diagnosis Date  . Angina of effort (HCC)   . Atrial fibrillation (HCC)   . Cancer (HCC)    breast  . CHF (congestive heart failure) (HCC)   . Deep venous thrombosis (HCC)   . Diabetes mellitus without complication (HCC)   . Hypercholesterolemia   . Hypertension   . Pulmonary embolism (HCC)   . Renal disorder   . Stroke [facility])     PAST SURGICAL HISTORY: Past Surgical History:  Procedure Laterality Date  . BREAST SURGERY    . COLONOSCOPY N/A 05/16/2016   Procedure: COLONOSCOPY;  Surgeon: Rachael Fee, MD;  Location: [facility] ENDOSCOPY;  Service: Endoscopy;  Laterality: N/A;  . ESOPHAGOGASTRODUODENOSCOPY N/A 05/16/2016   Procedure: ESOPHAGOGASTRODUODENOSCOPY (EGD);  Surgeon: Rachael Fee, MD;  Location: [facility] ENDOSCOPY;  Service: Endoscopy;  Laterality: N/A;  . LEFT HEART CATH AND CORONARY ANGIOGRAPHY N/A 05/18/2016   Procedure: Left Heart Cath and Coronary Angiography;  Surgeon: Corky Crafts, MD;  Location: [facility] INVASIVE CV LAB;  Service: Cardiovascular;  Laterality: N/A;  . MASTECTOMY Left 2017  . PARTIAL HYSTERECTOMY      FAMILY HISTORY Family History  Problem Relation Age of Onset  . Heart attack Mother   . Clotting disorder Neg Hx   The patient's father died from colorectal  cancer at age 68. The patient's mother died at age 68 from heart disease. The patient has 3 brothers, 1 sister. The sister was diagnosed with breast cancer in her late 68s. The patient has a niece diagnosed with breast cancer at age 8.  GYNECOLOGIC HISTORY:  No LMP recorded. Patient has had a hysterectomy. Menarche age 42, first live birth age 77. The patient is GX P5. She status post menopause but does not recall the date. She did not take hormone replacement. She never used oral contraceptives.  SOCIAL HISTORY:  The patient used to do gardening. She is originally from [place]. She is divorced. She is currently living with her daughter [name] hearing [place]. [name] is an Therapist, sports. The patient's other children are either in [place] or [place]. The patient has 8 grandchildren. She is a Nurse, learning disability.    ADVANCED DIRECTIVES: In place   HEALTH MAINTENANCE: Social History  Substance Use Topics  . Smoking status: Never Smoker  . Smokeless tobacco: Never Used  . Alcohol use No     Colonoscopy: Never  PAP:  Bone density: Never   Allergies  Allergen Reactions  . Dust Mite Extract     Current Outpatient Prescriptions  Medication Sig Dispense Refill  . anastrozole (ARIMIDEX) 1 MG tablet Take 1 tablet (1 mg total) by mouth daily. 30 tablet 1  . atorvastatin (LIPITOR) 20 MG tablet Take 1 tablet (20 mg total) by mouth at bedtime. 90 tablet 1  . carvedilol (COREG) 6.25 MG tablet Take 1 tablet (6.25 mg total) by mouth 2 (two) times daily with a meal. 180 tablet 1  . docusate sodium (COLACE) 100 MG capsule Take 1 capsule (100 mg total) by mouth 2 (two) times daily. 30 capsule 1  . feeding supplement, GLUCERNA SHAKE, (GLUCERNA SHAKE) LIQD Take 237 mLs by mouth 3 (three) times daily between meals. 30 Can 0  . folic acid (FOLVITE) 595 MCG tablet Take 800 mcg by mouth every morning.     [name] glucose blood (TRUE METRIX BLOOD GLUCOSE TEST) test strip Use as instructed 100 each 12  . glucose  monitoring kit (FREESTYLE) monitoring kit 1 each by Does not apply route 4 (four) times daily - after meals and at bedtime. 1 month Diabetic Testing Supplies for QAC-QHS accuchecks.  Diagnosis E11.65. Any brand OK 1 each 1  . losartan (COZAAR) 50 MG tablet Take 1.5 tablets (75 mg total) by mouth daily. 135 tablet 1  . meloxicam (MOBIC) 7.5 MG tablet Take 1 tablet (7.5 mg total) by mouth 2 (two) times daily as needed for pain. 30 tablet 0  . Menthol, Topical Analgesic, (BIOFREEZE) 4 % GEL Apply 1 application topically 3 (three) times daily as needed. 1 Tube 5  . metFORMIN (GLUCOPHAGE) 1000 MG tablet Take 1 tablet (1,000 mg total) by mouth 2 (two) times daily. 180 tablet 1  . Multiple Vitamin (MULTIVITAMIN WITH MINERALS) TABS tablet Take 1 tablet by mouth every morning.     [name] omeprazole (PRILOSEC) 20 MG capsule Take 20 mg by mouth every morning.     . ondansetron (ZOFRAN) 4 MG tablet Take 1 tablet (4 mg total) by mouth every 8 (eight) hours as needed for nausea or vomiting.  30 tablet 1  . ranitidine (ZANTAC) 300 MG tablet Take 300 mg by mouth at bedtime.    . rivaroxaban (XARELTO) 20 MG TABS tablet Take 1 tablet (20 mg total) by mouth daily with supper. 90 tablet 1  . TRUEPLUS LANCETS 28G MISC 1 each by Does not apply route 2 (two) times daily. 100 each 3   No current facility-administered medications for this visit.     OBJECTIVE: Middle-aged African-American woman Who appears stated age  66:   12/20/16 0830  BP: (!) 160/78  Pulse: 62  Resp: 18  Temp: 97.9 F (36.6 C)  SpO2: 100%     Body mass index is 34.42 kg/m.    ECOG FS:2 - Symptomatic, <50% confined to bed  Sclerae unicteric, pupils round and equal Oropharynx clear and moist No cervical or supraclavicular adenopathy Lungs no rales or rhonchi Heart regular rate and rhythm Abd soft, nontender, positive bowel sounds MSK no focal spinal tenderness, no upper extremity lymphedema Neuro: nonfocal, well oriented, appropriate  affect Breasts: The right breast is unremarkable. The left breast is status post mastectomy. There is mild discomfort to palpation, which is not unexpected. There is no evidence of local recurrence. Both axillae are benign.  LAB RESULTS:  CMP     Component Value Date/Time   NA 148 (H) 09/08/2016 1136   K 4.2 09/08/2016 1136   CL 107 09/07/2016 1021   CO2 27 09/08/2016 1136   GLUCOSE 91 09/08/2016 1136   BUN 13.9 09/08/2016 1136   CREATININE 1.0 09/08/2016 1136   CALCIUM 10.1 09/08/2016 1136   PROT 7.2 09/08/2016 1136   ALBUMIN 3.8 09/08/2016 1136   AST 22 09/08/2016 1136   ALT 19 09/08/2016 1136   ALKPHOS 65 09/08/2016 1136   BILITOT 0.45 09/08/2016 1136   GFRNONAA 52 (L) 09/07/2016 1021   GFRAA 59 (L) 09/07/2016 1021    INo results found for: SPEP, UPEP  Lab Results  Component Value Date   WBC 5.2 12/20/2016   NEUTROABS 3.1 12/20/2016   HGB 11.9 12/20/2016   HCT 35.7 12/20/2016   MCV 89.6 12/20/2016   PLT 226 12/20/2016      Chemistry      Component Value Date/Time   NA 148 (H) 09/08/2016 1136   K 4.2 09/08/2016 1136   CL 107 09/07/2016 1021   CO2 27 09/08/2016 1136   BUN 13.9 09/08/2016 1136   CREATININE 1.0 09/08/2016 1136      Component Value Date/Time   CALCIUM 10.1 09/08/2016 1136   ALKPHOS 65 09/08/2016 1136   AST 22 09/08/2016 1136   ALT 19 09/08/2016 1136   BILITOT 0.45 09/08/2016 1136       No results found for: LABCA2  No components found for: LABCA125  No results for input(s): INR in the last 168 hours.  Urinalysis    Component Value Date/Time   COLORURINE YELLOW 05/27/2016 0853   APPEARANCEUR CLEAR 05/27/2016 0853   LABSPEC 1.010 09/07/2016 1014   PHURINE 5.5 09/07/2016 1014   GLUCOSEU NEGATIVE 09/07/2016 1014   HGBUR TRACE (A) 09/07/2016 1014   BILIRUBINUR NEGATIVE 09/07/2016 1014   KETONESUR NEGATIVE 09/07/2016 1014   PROTEINUR NEGATIVE 09/07/2016 1014   UROBILINOGEN 0.2 09/07/2016 1014   NITRITE NEGATIVE 09/07/2016 1014    LEUKOCYTESUR TRACE (A) 09/07/2016 1014     STUDIES: Mammography with tomography of the right breast at the [facility] 07/13/2016 showed the breast density to be category B. There are no findings suggestive of malignancy.  ELIGIBLE FOR AVAILABLE RESEARCH PROTOCOL: no  ASSESSMENT: 66 y.o. [place] woman originally from [place], status post left mastectomy and sentinel lymph node sampling 07/29/2015 for a pT2 pN1, stage IIB invasive breast cancer, mixed ductal and lobular, strongly estrogen and progesterone receptor positive, HER-2 negative by FISH  (1) she received adjuvant chemotherapy consisting (most likely) of cyclophosphamide and doxorubicin 4 given between October and December 2017, then started paclitaxel 04/06/2016, discontinued after one dose because of neuropathy and other complications  (2) considered adjuvant radiation, decided against it due to delay  (3) anastrozole started 05/11/2016  (a) bone density 07/13/2016 normal with a T score of -0.2  (4) pulmonary embolus and right lower extremity deep vein thrombosis diagnosed 04/24/2016,  (a) status post enoxaparin 04/24/2016 through 05/03/2016  (b) transitioned to apixaban 05/03/2016  (5) status post stroke with moderate residual  PLAN: Brittain is now a year and a half out from definitive surgery for her breast cancer with no evidence of disease recurrence. This is favorable.  She is tolerating anastrozole well. The plan will be to continue that for a total of 5 years.  We did get a copy today of her prior records from [place] and this includes the fish for HER-2 which was convincingly negative with a ratio of 1.1.  She continues on apixaban. She has had no problems from that medication  She is considering returning to [place]. However she would come here for her yearly mammogram and I will plan to see her a year from now or earlier depending on her schedule.  She knows to call for any problems that may develop before  her next visit here  Magrinat, Virgie Dad, MD  12/20/16 8:47 AM Medical Oncology and Hematology [facility] [place],  [address] Tel. [phone]    Fax. [phone]  This document serves as a record of services personally performed by Lurline Del, MD. It was created on her behalf by Steva Colder, a trained medical scribe. The creation of this record is based on the scribe's personal observations and the provider's statements to them. This document has been checked and approved by the attending provider.

## 2016-12-21 LAB — VITAMIN D 25 HYDROXY (VIT D DEFICIENCY, FRACTURES): Vitamin D, 25-Hydroxy: 43 ng/mL (ref 30.0–100.0)

## 2016-12-28 ENCOUNTER — Encounter: Payer: Self-pay | Admitting: Family Medicine

## 2016-12-28 ENCOUNTER — Ambulatory Visit (INDEPENDENT_AMBULATORY_CARE_PROVIDER_SITE_OTHER): Payer: Medicare Other | Admitting: Family Medicine

## 2016-12-28 ENCOUNTER — Other Ambulatory Visit: Payer: Self-pay | Admitting: Oncology

## 2016-12-28 VITALS — BP 140/72 | HR 83 | Temp 98.3°F | Resp 16 | Ht 62.0 in | Wt 183.0 lb

## 2016-12-28 DIAGNOSIS — I639 Cerebral infarction, unspecified: Secondary | ICD-10-CM | POA: Diagnosis not present

## 2016-12-28 DIAGNOSIS — R413 Other amnesia: Secondary | ICD-10-CM | POA: Diagnosis not present

## 2016-12-28 DIAGNOSIS — N182 Chronic kidney disease, stage 2 (mild): Secondary | ICD-10-CM | POA: Diagnosis not present

## 2016-12-28 DIAGNOSIS — E119 Type 2 diabetes mellitus without complications: Secondary | ICD-10-CM

## 2016-12-28 DIAGNOSIS — R451 Restlessness and agitation: Secondary | ICD-10-CM

## 2016-12-28 DIAGNOSIS — F419 Anxiety disorder, unspecified: Secondary | ICD-10-CM

## 2016-12-28 DIAGNOSIS — E1129 Type 2 diabetes mellitus with other diabetic kidney complication: Secondary | ICD-10-CM | POA: Diagnosis not present

## 2016-12-28 LAB — POCT URINALYSIS DIP (DEVICE)
Bilirubin Urine: NEGATIVE
Glucose, UA: NEGATIVE mg/dL
Ketones, ur: NEGATIVE mg/dL
NITRITE: NEGATIVE
PH: 5.5 (ref 5.0–8.0)
PROTEIN: NEGATIVE mg/dL
Specific Gravity, Urine: 1.01 (ref 1.005–1.030)
UROBILINOGEN UA: 0.2 mg/dL (ref 0.0–1.0)

## 2016-12-28 LAB — POCT GLYCOSYLATED HEMOGLOBIN (HGB A1C): Hemoglobin A1C: 5.4

## 2016-12-28 MED ORDER — METFORMIN HCL 500 MG PO TABS: 500.0000 mg | ORAL_TABLET | Freq: Every day | ORAL | 1 refills | Status: DC

## 2016-12-28 MED FILL — ANASTROZOLE 1 MG TABLET: 1 | 30 days supply | Qty: 30 | Fill #1

## 2016-12-28 NOTE — Patient Instructions (Signed)
Your hemoglobin a1C has improved to 5.4, will decrease Metformin to 500 mg with breakfast.   Sent a referral to psychiatry for anxiety and irritability   Generalized Anxiety Disorder, Adult Generalized anxiety disorder (GAD) is a mental health disorder. People with this condition constantly worry about everyday events. Unlike normal anxiety, worry related to GAD is not triggered by a specific event. These worries also do not fade or get better with time. GAD interferes with life functions, including relationships, work, and school. GAD can vary from mild to severe. People with severe GAD can have intense waves of anxiety with physical symptoms (panic attacks). What are the causes? The exact cause of GAD is not known. What increases the risk? This condition is more likely to develop in:  Women.  People who have a family history of anxiety disorders.  People who are very shy.  People who experience very stressful life events, such as the death of a loved one.  People who have a very stressful family environment.  What are the signs or symptoms? People with GAD often worry excessively about many things in their lives, such as their health and family. They may also be overly concerned about:  Doing well at work.  Being on time.  Natural disasters.  Friendships.  Physical symptoms of GAD include:  Fatigue.  Muscle tension or having muscle twitches.  Trembling or feeling shaky.  Being easily startled.  Feeling like your heart is pounding or racing.  Feeling out of breath or like you cannot take a deep breath.  Having trouble falling asleep or staying asleep.  Sweating.  Nausea, diarrhea, or irritable bowel syndrome (IBS).  Headaches.  Trouble concentrating or remembering facts.  Restlessness.  Irritability.  How is this diagnosed? Your health care provider can diagnose GAD based on your symptoms and medical history. You will also have a physical exam. The  health care provider will ask specific questions about your symptoms, including how severe they are, when they started, and if they come and go. Your health care provider may ask you about your use of alcohol or drugs, including prescription medicines. Your health care provider may refer you to a mental health specialist for further evaluation. Your health care provider will do a thorough examination and may perform additional tests to rule out other possible causes of your symptoms. To be diagnosed with GAD, a person must have anxiety that:  Is out of his or her control.  Affects several different aspects of his or her life, such as work and relationships.  Causes distress that makes him or her unable to take part in normal activities.  Includes at least three physical symptoms of GAD, such as restlessness, fatigue, trouble concentrating, irritability, muscle tension, or sleep problems.  Before your health care provider can confirm a diagnosis of GAD, these symptoms must be present more days than they are not, and they must last for six months or longer. How is this treated? The following therapies are usually used to treat GAD:  Medicine. Antidepressant medicine is usually prescribed for long-term daily control. Antianxiety medicines may be added in severe cases, especially when panic attacks occur.  Talk therapy (psychotherapy). Certain types of talk therapy can be helpful in treating GAD by providing support, education, and guidance. Options include: ? Cognitive behavioral therapy (CBT). People learn coping skills and techniques to ease their anxiety. They learn to identify unrealistic or negative thoughts and behaviors and to replace them with positive ones. ? Acceptance and  commitment therapy (ACT). This treatment teaches people how to be mindful as a way to cope with unwanted thoughts and feelings. ? Biofeedback. This process trains you to manage your body's response (physiological  response) through breathing techniques and relaxation methods. You will work with a therapist while machines are used to monitor your physical symptoms.  Stress management techniques. These include yoga, meditation, and exercise.  A mental health specialist can help determine which treatment is best for you. Some people see improvement with one type of therapy. However, other people require a combination of therapies. Follow these instructions at home:  Take over-the-counter and prescription medicines only as told by your health care provider.  Try to maintain a normal routine.  Try to anticipate stressful situations and allow extra time to manage them.  Practice any stress management or self-calming techniques as taught by your health care provider.  Do not punish yourself for setbacks or for not making progress.  Try to recognize your accomplishments, even if they are small.  Keep all follow-up visits as told by your health care provider. This is important. Contact a health care provider if:  Your symptoms do not get better.  Your symptoms get worse.  You have signs of depression, such as: ? A persistently sad, cranky, or irritable mood. ? Loss of enjoyment in activities that used to bring you joy. ? Change in weight or eating. ? Changes in sleeping habits. ? Avoiding friends or family members. ? Loss of energy for normal tasks. ? Feelings of guilt or worthlessness. Get help right away if:  You have serious thoughts about hurting yourself or others. If you ever feel like you may hurt yourself or others, or have thoughts about taking your own life, get help right away. You can go to your nearest emergency department or call:  Your local emergency services (911 in the [place]  A suicide crisis helpline, such as the [place] at 604 448 5008. This is open 24 hours a day.  Summary  Generalized anxiety disorder (GAD) is a mental health disorder  that involves worry that is not triggered by a specific event.  People with GAD often worry excessively about many things in their lives, such as their health and family.  GAD may cause physical symptoms such as restlessness, trouble concentrating, sleep problems, frequent sweating, nausea, diarrhea, headaches, and trembling or muscle twitching.  A mental health specialist can help determine which treatment is best for you. Some people see improvement with one type of therapy. However, other people require a combination of therapies. This information is not intended to replace advice given to you by your health care provider. Make sure you discuss any questions you have with your health care provider. Document Released: 07/23/2012 Document Revised: 02/16/2016 Document Reviewed: 02/16/2016 Elsevier Interactive Patient Education  [employer].

## 2017-01-01 NOTE — Progress Notes (Signed)
Subjective:    Patient ID: Carrie Watts, female    DOB: December 15, 1950, 66 y.o.   MRN: [id number]  HPI  Carrie Watts, a 66 year old female with a history of DMII, hypertension,  DVT, PE, and breast cancer presents accompanied by daughter Carrie Watts for a follow up of chronic conditions.    Carrie Watts family states that she has difficulty remembering at times. Carrie Watts also states that patient has angry outbursts and often lashes out at family. The family and the patient identify problems with changes in short term memory.   Functional Assessment:  Activities of Daily Living (ADLs):   She is independent in the following: ambulation, bathing and hygiene, feeding, continence, grooming, toileting and dressing   Patient also has a history of left breast cancer and is followed by Dr. Griffith Watts, oncologist. She is consistently taking Arimidex.    Patient has a history of DM II. She has been taking Metformin.Carrie Watts is 6.5. She denies foot ulcerations, increase appetite, nausea, paresthesia of the feet, polydipsia, polyuria, visual disturbances, vomitting and weight loss. She also has a history of hypertension.  She is not exercising and is not adherent to low salt diet.  Patient does not check blood pressure at home.  Patient denies irregular heart beat, lower extremity edema, orthopnea and palpitations.  Cardiovascular risk factors include: dyslipidemia and hypertension.    Past Medical History:  Diagnosis Date  . Angina of effort ([place])   . Atrial fibrillation ([place])   . Cancer (HCC)    breast  . CHF (congestive heart failure) ([place])   . Deep venous thrombosis ([place])   . Diabetes mellitus without complication ([place])   . Hypercholesterolemia   . Hypertension   . Pulmonary embolism ([place])   . Renal disorder   . Stroke [facility])    Social History   Social History  . Marital status: Divorced    Spouse name: N/A  . Number of children: N/A  . Years of education: N/A    Occupational History  . Not on file.   Social History Main Topics  . Smoking status: Never Smoker  . Smokeless tobacco: Never Used  . Alcohol use No  . Drug use: No  . Sexual activity: Not on file   Other Topics Concern  . Not on file   Social History Narrative  . No narrative on file   Immunization History  Administered Date(s) Administered  . Influenza,inj,Quad PF,6+ Mos 05/15/2016  . Pneumococcal Polysaccharide-23 05/15/2016     Review of Systems  Constitutional: Negative for unexpected weight change.  HENT: Negative.   Eyes: Negative.  Negative for visual disturbance.  Respiratory: Negative.  Negative for shortness of breath.   Cardiovascular: Negative.  Negative for chest pain, palpitations and leg swelling.  Gastrointestinal: Negative.   Endocrine: Negative.  Negative for cold intolerance, heat intolerance, polydipsia, polyphagia and polyuria.  Genitourinary: Negative.   Musculoskeletal: Positive for myalgias (occasional right thigh pain).       Ambulating with assistance.   Skin: Negative.   Allergic/Immunologic: Negative.  Negative for environmental allergies and immunocompromised state.  Neurological: Positive for weakness.  Hematological: Negative.        Objective:   Physical Exam  Constitutional: She is oriented to person, place, and time. She appears well-developed and well-nourished.  HENT:  Head: Normocephalic and atraumatic.  Right Ear: External ear normal.  Left Ear: External ear normal.  Nose: Nose normal.  Mouth/Throat: Oropharynx is clear and moist.  Eyes: Pupils are equal, round, and reactive to light. Conjunctivae and EOM are normal.  Neck: Normal range of motion. Neck supple.  Cardiovascular: Normal rate, normal heart sounds and intact distal pulses.   Pulmonary/Chest: [place] normal and breath sounds normal.  S/P mastectomy, post surgical scar; tender to palpation  Abdominal: Soft. Bowel sounds are normal.  Musculoskeletal:   Generalized weakness Ambulating with assistance    2/5 weakness to upper extremities  Neurological: She is alert and oriented to person, place, and time. She has normal reflexes.  Monofilament test negative  Skin: Skin is warm and dry. No rash noted.  Hyperpigmentation to fingernails and toenails  Psychiatric: She has a normal mood and affect. Her behavior is normal. Judgment and thought content normal.     BP 140/72 (BP Location: Right Arm, Patient Position: Sitting, Cuff Size: Large) Comment: MANUALLY  Pulse 83   Temp 98.3 F (36.8 C) (Oral)   Resp 16   Ht 5\' 2"  (1.575 m)   Wt 183 lb (83 kg)   SpO2 100%   BMI 33.47 kg/m  Assessment & Plan:  1. Type 2 diabetes mellitus without complication, without long-term current use of insulin (HCC) Hemoglobin Watts has decreased to 5.4. Will decrease Metformin to 500 mg daily with breakfast. Will recheck Watts in 3 months, will discontinue Metformin.  - HgB Watts - metFORMIN (GLUCOPHAGE) 500 MG tablet; Take 1 tablet (500 mg total) by mouth daily with breakfast.  Dispense: 90 tablet; Refill: 1  2. Anxiety GAD 7 : Generalized Anxiety Score 12/28/2016 05/09/2016  Nervous, Anxious, on Edge 1 0  Control/stop worrying 1 1  Worry too much - different things 1 1  Trouble relaxing 0 1  Restless 0 0  Easily annoyed or irritable 3 0  Afraid - awful might happen 0 0  Total GAD 7 Score 6 3  Anxiety Difficulty Somewhat difficult -     - Ambulatory referral to Psychiatry  3. Agitation  - Ambulatory referral to Psychiatry  4. Short-term memory loss Patient does not appear to have memory loss. Explained that one's short term memory stores small bits of information for 15-30 seconds. She may want to start writing information down in a diary. Reminded to get plenty of sleep exercise and mental stimulation.  Avoid brain drains, like increased stress.  MMSE - Mini Mental State Exam 12/28/2016  Orientation to time 5  Orientation to time comments 5   Orientation to Place 4  Registration 3  Attention/ Calculation 3  Recall 3  Language- name 2 objects 2  Language- repeat 1  Language- follow 3 step command 3  Language- read & follow direction 1  Write a sentence 1  Copy design 1  Total score Ranger  MSN, FNP-C [facility] [address] [place], [place] [address] [phone]

## 2017-01-03 ENCOUNTER — Telehealth: Payer: Self-pay

## 2017-01-04 ENCOUNTER — Ambulatory Visit (HOSPITAL_COMMUNITY)
Admission: RE | Admit: 2017-01-04 | Discharge: 2017-01-04 | Disposition: A | Discharge status: Home / Self Care | Payer: Medicare Other | Source: Ambulatory Visit | Attending: Internal Medicine | Admitting: Internal Medicine

## 2017-01-04 ENCOUNTER — Other Ambulatory Visit: Payer: Self-pay | Admitting: Family Medicine

## 2017-01-04 ENCOUNTER — Encounter (HOSPITAL_COMMUNITY): Payer: Self-pay | Admitting: Internal Medicine

## 2017-01-04 VITALS — BP 116/78 | HR 79 | Wt 183.0 lb

## 2017-01-04 DIAGNOSIS — R2990 Unspecified symptoms and signs involving the nervous system: Secondary | ICD-10-CM

## 2017-01-04 DIAGNOSIS — I2692 Saddle embolus of pulmonary artery without acute cor pulmonale: Secondary | ICD-10-CM | POA: Insufficient documentation

## 2017-01-04 DIAGNOSIS — Z7984 Long term (current) use of oral hypoglycemic drugs: Secondary | ICD-10-CM | POA: Insufficient documentation

## 2017-01-04 DIAGNOSIS — I2602 Saddle embolus of pulmonary artery with acute cor pulmonale: Secondary | ICD-10-CM | POA: Diagnosis not present

## 2017-01-04 DIAGNOSIS — I110 Hypertensive heart disease with heart failure: Secondary | ICD-10-CM | POA: Diagnosis not present

## 2017-01-04 DIAGNOSIS — Z79899 Other long term (current) drug therapy: Secondary | ICD-10-CM | POA: Diagnosis not present

## 2017-01-04 DIAGNOSIS — Z7901 Long term (current) use of anticoagulants: Secondary | ICD-10-CM | POA: Insufficient documentation

## 2017-01-04 DIAGNOSIS — C50919 Malignant neoplasm of unspecified site of unspecified female breast: Secondary | ICD-10-CM | POA: Diagnosis not present

## 2017-01-04 DIAGNOSIS — E7800 Pure hypercholesterolemia, unspecified: Secondary | ICD-10-CM | POA: Insufficient documentation

## 2017-01-04 DIAGNOSIS — I5022 Chronic systolic (congestive) heart failure: Secondary | ICD-10-CM

## 2017-01-04 DIAGNOSIS — E119 Type 2 diabetes mellitus without complications: Secondary | ICD-10-CM | POA: Insufficient documentation

## 2017-01-04 DIAGNOSIS — I4891 Unspecified atrial fibrillation: Secondary | ICD-10-CM | POA: Diagnosis not present

## 2017-01-04 DIAGNOSIS — R531 Weakness: Secondary | ICD-10-CM

## 2017-01-04 DIAGNOSIS — I428 Other cardiomyopathies: Secondary | ICD-10-CM | POA: Insufficient documentation

## 2017-01-04 DIAGNOSIS — Z853 Personal history of malignant neoplasm of breast: Secondary | ICD-10-CM | POA: Insufficient documentation

## 2017-01-04 DIAGNOSIS — I2510 Atherosclerotic heart disease of native coronary artery without angina pectoris: Secondary | ICD-10-CM | POA: Insufficient documentation

## 2017-01-04 DIAGNOSIS — D229 Melanocytic nevi, unspecified: Secondary | ICD-10-CM | POA: Insufficient documentation

## 2017-01-04 DIAGNOSIS — I10 Essential (primary) hypertension: Secondary | ICD-10-CM

## 2017-01-04 DIAGNOSIS — Z8673 Personal history of transient ischemic attack (TIA), and cerebral infarction without residual deficits: Secondary | ICD-10-CM | POA: Insufficient documentation

## 2017-01-04 DIAGNOSIS — I2782 Chronic pulmonary embolism: Secondary | ICD-10-CM

## 2017-01-04 MED ORDER — LOSARTAN POTASSIUM 100 MG PO TABS: 100.0000 mg | ORAL_TABLET | Freq: Every day | ORAL | 3 refills | Status: DC

## 2017-01-04 NOTE — Patient Instructions (Signed)
Increase Losartan 100 mg (1 tab) daily  You have been referred to Neurology rehab (they will call you)   Your physician recommends that you schedule a follow-up appointment in: 4 months

## 2017-01-04 NOTE — Telephone Encounter (Signed)
CALLED NO ANSWER. LEFT A MESSAGE FOR PATIENT TO CALL BACK.

## 2017-01-04 NOTE — Progress Notes (Signed)
Advanced Heart Failure Clinic Note   Referring Physician: Dr. Jana Hakim Primary Care: Dorena Dew, FNP Primary Cardiologist: Dr. Haroldine Laws   HPI:  Carrie Watts is a 66 y.o. female with a history of HTN, DM, L breast cancer s/p mastectomy and 5 rounds of chemo with cyclophosphamide,adriamycin and Taxol) in [place] and recent admission for PE and DVT at [facility] (04/24/16-05/03/16)  She flew back from [place] 1st or 2nd week of Jan 2018. Came to ER 1/14 with R sided facial droop and difficulty speaking and sob. Workup done in the ED showed a saddle pulmonary emboli. LE venous doppler showed right LE DVT. Her PE could be related to long flight travel vs. Hypercoagulable state secondary to breast cancer. Ruled out for stroke, MRI/MRA brain unremarkable, echo with bubble study stable, carotid duplex nonacute. Started on  on Lovenox, transitioned to apixaban at discharge. Her EF was 55-60% 04/24/16.  Seen oncology clinic 05/11/16. Patient continued to have dyspnea since discharge with intermittent chest pain. Repeat CTA showed resolving clot burden. Mild cardiomegaly. Borderline, improved RIGHT heart strain (RV/LV 0.9, previously 1.2). Repeat echo 2/18 showed EF 40-45% Mild to moderate global reduction in LV systolic function; grade 1 diastolic dysfunction; mild LVH; mild MR; trace TR with mildly elevated pulmonary pressure. Normal RV function. Cath with very mild CAD  At last visit we did echo and EF 50-55% (5/18). We also did CT scan and no residual PE.  Here for routine f/u. Feeling better. No dyspnea or CP. Much more active. No edema, orthopnea or PND. Main complaint is worsening stuttering and problems with memory. No other focal neuro deficits. Taking BPs daily. SBP 115-155    Cath 05/18/16  Mid RCA lesion, 10 %stenosed.  Mid LAD lesion, 40 %stenosed.  Mid Cx lesion, 10 %stenosed.  The left ventricular ejection fraction is 40-45% by visual estimate.  There is mild left ventricular  systolic dysfunction.  Vasospasm noted in the right radial artery.  LV end diastolic pressure is normal, 11 mm Hg.   Echo 04/2016: EF 55-60%, negative bubble study. Echo 05/2016: EF 40-45% Echo 08/2016: EF 50%    Past Medical History:  Diagnosis Date  . Angina of effort ([place])   . Atrial fibrillation ([place])   . Cancer (HCC)    breast  . CHF (congestive heart failure) ([place])   . Deep venous thrombosis ([place])   . Diabetes mellitus without complication ([place])   . Hypercholesterolemia   . Hypertension   . Pulmonary embolism ([place])   . Renal disorder   . Stroke [facility])     Current Outpatient Prescriptions  Medication Sig Dispense Refill  . anastrozole (ARIMIDEX) 1 MG tablet Take 1 tablet (1 mg total) by mouth daily. 30 tablet 1  . anastrozole (ARIMIDEX) 1 MG tablet TAKE 1 TABLET(1 MG) BY MOUTH DAILY 90 tablet 0  . atorvastatin (LIPITOR) 20 MG tablet Take 1 tablet (20 mg total) by mouth at bedtime. 90 tablet 1  . carvedilol (COREG) 6.25 MG tablet Take 1 tablet (6.25 mg total) by mouth 2 (two) times daily with a meal. 180 tablet 1  . docusate sodium (COLACE) 100 MG capsule Take 1 capsule (100 mg total) by mouth 2 (two) times daily. 30 capsule 1  . feeding supplement, GLUCERNA SHAKE, (GLUCERNA SHAKE) LIQD Take 237 mLs by mouth 3 (three) times daily between meals. 30 Can 0  . folic acid (FOLVITE) 315 MCG tablet Take 800 mcg by mouth every morning.     [name] glucose blood (TRUE METRIX  BLOOD GLUCOSE TEST) test strip Use as instructed 100 each 12  . glucose monitoring kit (FREESTYLE) monitoring kit 1 each by Does not apply route 4 (four) times daily - after meals and at bedtime. 1 month Diabetic Testing Supplies for QAC-QHS accuchecks.  Diagnosis E11.65. Any brand OK 1 each 1  . losartan (COZAAR) 50 MG tablet Take 1.5 tablets (75 mg total) by mouth daily. 135 tablet 1  . meloxicam (MOBIC) 7.5 MG tablet Take 1 tablet (7.5 mg total) by mouth 2 (two) times daily as needed for pain. 30 tablet 0  .  Menthol, Topical Analgesic, (BIOFREEZE) 4 % GEL Apply 1 application topically 3 (three) times daily as needed. 1 Tube 5  . metFORMIN (GLUCOPHAGE) 500 MG tablet Take 1 tablet (500 mg total) by mouth daily with breakfast. 90 tablet 1  . Multiple Vitamin (MULTIVITAMIN WITH MINERALS) TABS tablet Take 1 tablet by mouth every morning.     [name] omeprazole (PRILOSEC) 20 MG capsule Take 20 mg by mouth every morning.     . ondansetron (ZOFRAN) 4 MG tablet Take 1 tablet (4 mg total) by mouth every 8 (eight) hours as needed for nausea or vomiting. 30 tablet 1  . ranitidine (ZANTAC) 300 MG tablet Take 300 mg by mouth at bedtime.    . rivaroxaban (XARELTO) 20 MG TABS tablet Take 1 tablet (20 mg total) by mouth daily with supper. 90 tablet 1  . TRUEPLUS LANCETS 28G MISC 1 each by Does not apply route 2 (two) times daily. 100 each 3   No current facility-administered medications for this encounter.     Allergies  Allergen Reactions  . Dust Mite Extract       Social History   Social History  . Marital status: Divorced    Spouse name: N/A  . Number of children: N/A  . Years of education: N/A   Occupational History  . Not on file.   Social History Main Topics  . Smoking status: Never Smoker  . Smokeless tobacco: Never Used  . Alcohol use No  . Drug use: No  . Sexual activity: Not on file   Other Topics Concern  . Not on file   Social History Narrative  . No narrative on file      Family History  Problem Relation Age of Onset  . Heart attack Mother   . Clotting disorder Neg Hx     Vitals:   01/04/17 1451  BP: 116/78  Pulse: 79  SpO2: 97%  Weight: 183 lb (83 kg)   Wt Readings from Last 3 Encounters:  01/04/17 183 lb (83 kg)  12/28/16 183 lb (83 kg)  12/20/16 188 lb 3.2 oz (85.4 kg)     PHYSICAL EXAM: General:  Well appearing. No resp difficulty HEENT: normal Neck: supple. no JVD. Carotids 2+ bilat; no bruits. No lymphadenopathy or thryomegaly appreciated. Cor: PMI  nondisplaced. Regular rate & rhythm. No rubs, gallops or murmurs. Lungs: clear Abdomen: obese, soft, nontender, nondistended. No hepatosplenomegaly. No bruits or masses. Good bowel sounds. Extremities: no cyanosis, clubbing, rash, edema Neuro: alert & orientedx3, cranial nerves grossly intact. moves all 4 extremities w/o difficulty. Affect pleasant. Stutters frequently    ASSESSMENT & PLAN:  1. Chronic systolic HF: NICM, cath in 05/2016 with minimal CAD. ? Related to adriamycin versus stress related to massive PE - Echo 5/18 EF 50-55%. Suspect transient LV dysfunttion due to massive PE and not adriamycin toxicity.  - Volume status stable on exam - Continue Coreg  6.15m BID.  - Increase losartan to 1048mdaily with elevated BP - Continue prn lasix.  2. Saddle PE/DVT 1/18 - Continue Xarelto full dose for one year. Then, would benefit from chronic therapy with 101marelto daily or Eliquis 2.5mg74mD. She has increased risk for thromboembolic events with history of breast CA and frequently travels to [place] Repeat chest CT 5/18 with no residual clot. No RV strain on echo  3. Breast CA    -- Per Oncology. Has completed neo-adjuvant chemo including Adriamycin 4. Hypertension - Increase losartan as above.  5. H/o CVA with worsening stuttering. - Refer to speech therapy. Continue atorva. No ASA with DOAC.   Follow up in 3-4 months.   BensGlori Bickers  3:01 PM

## 2017-01-04 NOTE — Addendum Note (Signed)
Encounter addended by: Shirley Muscat, RN on: 01/04/2017  3:25 PM<BR>    Actions taken: Visit diagnoses modified, Diagnosis association updated, Order list changed, Sign clinical note

## 2017-01-04 NOTE — Telephone Encounter (Signed)
Patient called back, ask about dermatology referral. I advised that we are sending this in for her and to listen out for a call from their office. Thanks!

## 2017-01-17 ENCOUNTER — Encounter (HOSPITAL_COMMUNITY): Payer: Self-pay | Admitting: Licensed Clinical Social Worker

## 2017-01-17 ENCOUNTER — Ambulatory Visit (INDEPENDENT_AMBULATORY_CARE_PROVIDER_SITE_OTHER): Payer: Medicare Other | Admitting: Licensed Clinical Social Worker

## 2017-01-17 DIAGNOSIS — Z639 Problem related to primary support group, unspecified: Secondary | ICD-10-CM

## 2017-01-17 NOTE — Progress Notes (Signed)
Carrie Watts is a 66 y.o. female patient who did not present today for therapy. Pt was greeted in the lobby by counselor and quickly stated "I don't need a counselor, I thought this was a dermatology appointment." Pt was accompanied by her daughter who then admitted that she did not tell her mother why she brought her mother to the appointment since her mother would not have come. Daughter states that "I don't see a future for my mother if she does not stop being so angry and moody".   Counselor sat in lobby w/ pt and daughter for approximately 15 min to discuss future meetings. Pt was somewhat agreeable to this despite initially stating she does not need "psychiatric help".   Counselor advised pt to call back and make an appt on her own time and initiative when she is ready. Counselor told pt she would not be billed for today's meeting. Pt agreed that "This might be a good option" and that she enjoyed talking to therapist in the lobby today.   Daughter agreed "this is all we can do right now".       Archie Balboa, LCAS-A

## 2017-01-19 ENCOUNTER — Other Ambulatory Visit: Payer: Self-pay | Admitting: Family Medicine

## 2017-01-26 ENCOUNTER — Other Ambulatory Visit: Payer: Self-pay

## 2017-01-26 MED ORDER — TRUEPLUS LANCETS 28G MISC
1.0000 | Freq: Four times a day (QID) | 3 refills | Status: DC
Start: 2017-01-26 — End: 2018-05-02

## 2017-03-08 ENCOUNTER — Other Ambulatory Visit: Payer: Medicare Other

## 2017-03-08 ENCOUNTER — Ambulatory Visit: Payer: Medicare Other | Admitting: Oncology

## 2017-03-29 ENCOUNTER — Ambulatory Visit: Payer: Medicare Other | Admitting: Family Medicine

## 2017-04-13 ENCOUNTER — Other Ambulatory Visit: Payer: Self-pay | Admitting: Oncology

## 2017-04-29 ENCOUNTER — Other Ambulatory Visit (HOSPITAL_COMMUNITY): Payer: Self-pay | Admitting: Internal Medicine

## 2017-05-13 ENCOUNTER — Other Ambulatory Visit: Payer: Self-pay | Admitting: Family Medicine

## 2017-06-16 ENCOUNTER — Other Ambulatory Visit: Payer: Self-pay | Admitting: Family Medicine

## 2017-06-16 DIAGNOSIS — E119 Type 2 diabetes mellitus without complications: Secondary | ICD-10-CM

## 2017-06-24 ENCOUNTER — Other Ambulatory Visit: Payer: Self-pay | Admitting: Family Medicine

## 2017-07-06 ENCOUNTER — Other Ambulatory Visit: Payer: Self-pay | Admitting: Oncology

## 2017-08-24 ENCOUNTER — Other Ambulatory Visit: Payer: Self-pay | Admitting: Oncology

## 2017-08-24 DIAGNOSIS — Z1231 Encounter for screening mammogram for malignant neoplasm of breast: Secondary | ICD-10-CM

## 2017-08-28 ENCOUNTER — Other Ambulatory Visit (HOSPITAL_COMMUNITY): Payer: Self-pay | Admitting: Internal Medicine

## 2017-08-29 ENCOUNTER — Ambulatory Visit
Admission: RE | Admit: 2017-08-29 | Discharge: 2017-08-29 | Disposition: A | Discharge status: Home / Self Care | Payer: Medicare Other | Source: Ambulatory Visit | Attending: Oncology | Admitting: Oncology

## 2017-08-29 DIAGNOSIS — E1129 Type 2 diabetes mellitus with other diabetic kidney complication: Secondary | ICD-10-CM | POA: Diagnosis not present

## 2017-08-29 DIAGNOSIS — Z1231 Encounter for screening mammogram for malignant neoplasm of breast: Secondary | ICD-10-CM | POA: Diagnosis not present

## 2017-08-29 DIAGNOSIS — N182 Chronic kidney disease, stage 2 (mild): Secondary | ICD-10-CM | POA: Diagnosis not present

## 2017-08-30 ENCOUNTER — Encounter: Payer: Self-pay | Admitting: Family Medicine

## 2017-08-30 ENCOUNTER — Ambulatory Visit (INDEPENDENT_AMBULATORY_CARE_PROVIDER_SITE_OTHER): Payer: Medicare Other | Admitting: Family Medicine

## 2017-08-30 VITALS — BP 129/77 | HR 65 | Temp 98.3°F | Resp 16 | Ht 62.0 in | Wt 199.0 lb

## 2017-08-30 DIAGNOSIS — E1165 Type 2 diabetes mellitus with hyperglycemia: Secondary | ICD-10-CM

## 2017-08-30 DIAGNOSIS — E118 Type 2 diabetes mellitus with unspecified complications: Secondary | ICD-10-CM

## 2017-08-30 DIAGNOSIS — I69393 Ataxia following cerebral infarction: Secondary | ICD-10-CM

## 2017-08-30 DIAGNOSIS — E119 Type 2 diabetes mellitus without complications: Secondary | ICD-10-CM | POA: Diagnosis not present

## 2017-08-30 DIAGNOSIS — M19042 Primary osteoarthritis, left hand: Secondary | ICD-10-CM

## 2017-08-30 DIAGNOSIS — Z23 Encounter for immunization: Secondary | ICD-10-CM

## 2017-08-30 LAB — POCT GLYCOSYLATED HEMOGLOBIN (HGB A1C): Hemoglobin A1C: 5.7 % — AB (ref 4.0–5.6)

## 2017-08-30 LAB — POCT URINALYSIS DIPSTICK
Bilirubin, UA: NEGATIVE
Glucose, UA: NEGATIVE
Ketones, UA: NEGATIVE
LEUKOCYTES UA: NEGATIVE
NITRITE UA: NEGATIVE
PH UA: 6 (ref 5.0–8.0)
PROTEIN UA: NEGATIVE
SPEC GRAV UA: 1.01 (ref 1.010–1.025)
UROBILINOGEN UA: 0.2 U/dL

## 2017-08-30 MED ORDER — ACETAMINOPHEN 500 MG PO TABS: 500.0000 mg | ORAL_TABLET | Freq: Four times a day (QID) | ORAL | 0 refills | Status: AC | PRN

## 2017-08-30 MED ORDER — METFORMIN HCL 500 MG PO TABS: ORAL_TABLET | ORAL | 1 refills | Status: DC

## 2017-08-30 NOTE — Patient Instructions (Addendum)
  Your hemoglobin a1C is 5.7, will continue Metformin 500 mg daily.  Recommend a lowfat, low carbohydrate diet divided over 5-6 small meals, increase water intake to 6-8 glasses, and 150 minutes per week of cardiovascular exercise.   Please increase your water intake  Follow up with specialists as scheduled.   For osteoarthritis pain, recommend tylenol 500 mg every 6 hours as needed.

## 2017-08-30 NOTE — Progress Notes (Signed)
Subjective:    Patient ID: Carrie Watts, female    DOB: November 05, 1950, 67 y.o.   MRN: [id number]  HPI  Carrie Watts, a 67 year old female with a history of DMII, hypertension,  DVT, PE, and breast cancer presents accompanied by daughter [name] for a follow up of chronic conditions.  Carrie Watts recently return from [place], [place] is her home.  She says that she is doing well overall. Patient continues to complain of periodic stuttering and slurred speech.  Patient has a history of strokelike symptoms and an abnormal head MRI in January 2018.  Impression of head MRI showed moderate atrophy with advanced nonacute white matter signal abnormality and she artery 1 malformation.  Patient denies headache, blurred vision, numbness to upper extremities, or recent falls    Patient also has a history of left breast cancer and is followed by Dr. Griffith Citron, oncologist. She is consistently taking Arimidex.    Patient has a history of DM II. She has been taking Metformin.Carrie Watts most recent hemoglobin a1C is 6.5. She denies foot ulcerations, increase appetite, nausea, paresthesia of the feet, polydipsia, polyuria, visual disturbances, vomitting and weight loss. She also has a history of hypertension.  She is not exercising and is not adherent to low salt diet.  Patient does not check blood pressure at home.  Patient denies irregular heart beat, lower extremity edema, orthopnea and palpitations.  Cardiovascular risk factors include: dyslipidemia and hypertension.   Patient is also complaining of bilateral hand pain.  Hand pain is primarily to right hand.  Patient is right-hand dominant.  She primarily notices pain upon awakening.  She has not attempted any over-the-counter interventions to alleviate symptoms.  She characterizes pain as intermittent and aching.  She does not have a history of ostial rheumatoid arthritis.   Past Medical History:  Diagnosis Date  . Angina of effort ([place])   . Atrial  fibrillation ([place])   . Cancer (HCC)    breast  . CHF (congestive heart failure) ([place])   . Deep venous thrombosis ([place])   . Diabetes mellitus without complication ([place])   . Hypercholesterolemia   . Hypertension   . Pulmonary embolism ([place])   . Renal disorder   . Stroke [facility])    Social History   Socioeconomic History  . Marital status: Divorced    Spouse name: Not on file  . Number of children: Not on file  . Years of education: Not on file  . Highest education level: Not on file  Occupational History  . Not on file  Social Needs  . Financial resource strain: Not on file  . Food insecurity:    Worry: Not on file    Inability: Not on file  . Transportation needs:    Medical: Not on file    Non-medical: Not on file  Tobacco Use  . Smoking status: Never Smoker  . Smokeless tobacco: Never Used  Substance and Sexual Activity  . Alcohol use: No  . Drug use: No  . Sexual activity: Not on file  Lifestyle  . Physical activity:    Days per week: Not on file    Minutes per session: Not on file  . Stress: Not on file  Relationships  . Social connections:    Talks on phone: Not on file    Gets together: Not on file    Attends religious service: Not on file    Active member of club or organization: Not on file  Attends meetings of clubs or organizations: Not on file    Relationship status: Not on file  . Intimate partner violence:    Fear of current or ex partner: Not on file    Emotionally abused: Not on file    Physically abused: Not on file    Forced sexual activity: Not on file  Other Topics Concern  . Not on file  Social History Narrative  . Not on file   Immunization History  Administered Date(s) Administered  . Influenza,inj,Quad PF,6+ Mos 05/15/2016  . Pneumococcal Polysaccharide-23 05/15/2016     Review of Systems  Constitutional: Negative for unexpected weight change.  HENT: Negative.   Eyes: Negative.  Negative for visual disturbance.  Respiratory:  Negative.  Negative for shortness of breath.   Cardiovascular: Negative.  Negative for chest pain, palpitations and leg swelling.  Gastrointestinal: Negative.   Endocrine: Negative.  Negative for cold intolerance, heat intolerance, polydipsia, polyphagia and polyuria.  Genitourinary: Negative.   Musculoskeletal: Positive for myalgias (occasional right thigh pain).       Ambulating with assistance.   Skin: Negative.   Allergic/Immunologic: Negative.  Negative for environmental allergies and immunocompromised state.  Neurological: Positive for weakness.  Hematological: Negative.        Objective:   Physical Exam  Constitutional: She is oriented to person, place, and time. She appears well-developed and well-nourished.  HENT:  Head: Normocephalic and atraumatic.  Right Ear: External ear normal.  Left Ear: External ear normal.  Nose: Nose normal.  Mouth/Throat: Oropharynx is clear and moist.  Eyes: Pupils are equal, round, and reactive to light. Conjunctivae and EOM are normal.  Neck: Normal range of motion. Neck supple.  Cardiovascular: Normal rate, normal heart sounds and intact distal pulses.  Pulmonary/Chest: [place] normal and breath sounds normal.  S/P mastectomy, post surgical scar; tender to palpation  Abdominal: Soft. Bowel sounds are normal.  Musculoskeletal:       Right hand: She exhibits normal range of motion, no tenderness and no swelling. Normal strength noted. She exhibits no finger abduction.       Left hand: She exhibits bony tenderness. She exhibits normal range of motion and no tenderness. Normal strength noted. She exhibits no finger abduction, no thumb/finger opposition and no wrist extension trouble.  Neurological: She is alert and oriented to person, place, and time. She has normal reflexes.  Monofilament test negative  Skin: Skin is warm and dry. No rash noted.  Hyperpigmentation to fingernails and toenails  Psychiatric: She has a normal mood and affect. Her  behavior is normal. Judgment and thought content normal.     BP 129/77 (BP Location: Right Arm, Patient Position: Sitting, Cuff Size: Normal)   Pulse 65   Temp 98.3 F (36.8 C) (Oral)   Resp 16   Ht 5\' 2"  (1.575 m)   Wt 199 lb (90.3 kg)   SpO2 100%   BMI 36.40 kg/m  Assessment & Plan:  1. Type 2 diabetes mellitus without complication, without long-term current use of insulin (HCC) Gl[place]in A1c is 5.7.  Will reduce metformin to 500 mg daily.  Continue carbohydrate modify diet and increase daily activity. - HgB A1c - Urinalysis Dipstick - metFORMIN (GLUCOPHAGE) 500 MG tablet; TAKE 1 TABLET(500 MG) BY MOUTH DAILY WITH BREAKFAST  Dispense: 90 tablet; Refill: 1 - Comprehensive metabolic panel - CBC with Differential  2. 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 - Ambulatory referral to Speech Therapy  3. Osteoarthritis of left hand, unspecified osteoarthritis type I suspect osteoarthritis due to nature of pain in hands bilaterally.  I recommend Tylenol 500 mg every 6 hours as needed for mild to moderate pain. - acetaminophen (TYLENOL) 500 MG tablet; Take 1 tablet (500 mg total) by mouth every 6 (six) hours as needed.  Dispense: 30 tablet; Refill: 0  4. Immunization due= - Pneumococcal conjugate vaccine 13-valent   RTC: 3 months for chronic conditions.   Donia Pounds  MSN, FNP-C [facility] [address] [place], [place] [address] [phone]

## 2017-08-31 LAB — CBC WITH DIFFERENTIAL/PLATELET
BASOS ABS: 0 10*3/uL (ref 0.0–0.2)
Basos: 0 %
EOS (ABSOLUTE): 0.2 10*3/uL (ref 0.0–0.4)
Eos: 4 %
HEMOGLOBIN: 14.1 g/dL (ref 11.1–15.9)
Hematocrit: 42.3 % (ref 34.0–46.6)
Immature Grans (Abs): 0 10*3/uL (ref 0.0–0.1)
Immature Granulocytes: 0 %
LYMPHS ABS: 2.3 10*3/uL (ref 0.7–3.1)
Lymphs: 35 %
MCH: 30.7 pg (ref 26.6–33.0)
MCHC: 33.3 g/dL (ref 31.5–35.7)
MCV: 92 fL (ref 79–97)
Monocytes Absolute: 0.3 10*3/uL (ref 0.1–0.9)
Monocytes: 4 %
Neutrophils Absolute: 3.8 10*3/uL (ref 1.4–7.0)
Neutrophils: 57 %
PLATELETS: 238 10*3/uL (ref 150–450)
RBC: 4.59 x10E6/uL (ref 3.77–5.28)
RDW: 13.7 % (ref 12.3–15.4)
WBC: 6.5 10*3/uL (ref 3.4–10.8)

## 2017-08-31 LAB — COMPREHENSIVE METABOLIC PANEL
A/G RATIO: 1.6 (ref 1.2–2.2)
ALBUMIN: 4.6 g/dL (ref 3.6–4.8)
ALK PHOS: 65 IU/L (ref 39–117)
ALT: 26 IU/L (ref 0–32)
AST: 26 IU/L (ref 0–40)
BILIRUBIN TOTAL: 0.5 mg/dL (ref 0.0–1.2)
BUN / CREAT RATIO: 19 (ref 12–28)
BUN: 23 mg/dL (ref 8–27)
CHLORIDE: 107 mmol/L — AB (ref 96–106)
CO2: 22 mmol/L (ref 20–29)
Calcium: 10.5 mg/dL — ABNORMAL HIGH (ref 8.7–10.3)
Creatinine, Ser: 1.19 mg/dL — ABNORMAL HIGH (ref 0.57–1.00)
GFR calc Af Amer: 54 mL/min/{1.73_m2} — ABNORMAL LOW (ref >59)
GFR calc non Af Amer: 47 mL/min/{1.73_m2} — ABNORMAL LOW (ref >59)
GLUCOSE: 91 mg/dL (ref 65–99)
Globulin, Total: 2.9 g/dL (ref 1.5–4.5)
POTASSIUM: 4.7 mmol/L (ref 3.5–5.2)
Sodium: 144 mmol/L (ref 134–144)
Total Protein: 7.5 g/dL (ref 6.0–8.5)

## 2017-09-01 ENCOUNTER — Ambulatory Visit (HOSPITAL_COMMUNITY)
Admission: RE | Admit: 2017-09-01 | Discharge: 2017-09-01 | Disposition: A | Discharge status: Home / Self Care | Payer: Medicare Other | Source: Ambulatory Visit | Attending: Internal Medicine | Admitting: Internal Medicine

## 2017-09-01 ENCOUNTER — Telehealth: Payer: Self-pay

## 2017-09-01 ENCOUNTER — Other Ambulatory Visit: Payer: Self-pay | Admitting: Family Medicine

## 2017-09-01 VITALS — BP 122/78 | HR 72 | Wt 200.0 lb

## 2017-09-01 DIAGNOSIS — I110 Hypertensive heart disease with heart failure: Secondary | ICD-10-CM | POA: Diagnosis not present

## 2017-09-01 DIAGNOSIS — I4891 Unspecified atrial fibrillation: Secondary | ICD-10-CM | POA: Diagnosis not present

## 2017-09-01 DIAGNOSIS — Z7901 Long term (current) use of anticoagulants: Secondary | ICD-10-CM | POA: Insufficient documentation

## 2017-09-01 DIAGNOSIS — Z79899 Other long term (current) drug therapy: Secondary | ICD-10-CM | POA: Diagnosis not present

## 2017-09-01 DIAGNOSIS — Z7984 Long term (current) use of oral hypoglycemic drugs: Secondary | ICD-10-CM | POA: Insufficient documentation

## 2017-09-01 DIAGNOSIS — I5022 Chronic systolic (congestive) heart failure: Secondary | ICD-10-CM

## 2017-09-01 DIAGNOSIS — E119 Type 2 diabetes mellitus without complications: Secondary | ICD-10-CM | POA: Insufficient documentation

## 2017-09-01 DIAGNOSIS — E7800 Pure hypercholesterolemia, unspecified: Secondary | ICD-10-CM | POA: Insufficient documentation

## 2017-09-01 DIAGNOSIS — Z853 Personal history of malignant neoplasm of breast: Secondary | ICD-10-CM | POA: Insufficient documentation

## 2017-09-01 DIAGNOSIS — Z86711 Personal history of pulmonary embolism: Secondary | ICD-10-CM | POA: Insufficient documentation

## 2017-09-01 DIAGNOSIS — I10 Essential (primary) hypertension: Secondary | ICD-10-CM | POA: Diagnosis not present

## 2017-09-01 DIAGNOSIS — I428 Other cardiomyopathies: Secondary | ICD-10-CM | POA: Insufficient documentation

## 2017-09-01 DIAGNOSIS — Z86718 Personal history of other venous thrombosis and embolism: Secondary | ICD-10-CM | POA: Insufficient documentation

## 2017-09-01 DIAGNOSIS — Z8673 Personal history of transient ischemic attack (TIA), and cerebral infarction without residual deficits: Secondary | ICD-10-CM | POA: Diagnosis not present

## 2017-09-01 DIAGNOSIS — Z9221 Personal history of antineoplastic chemotherapy: Secondary | ICD-10-CM | POA: Insufficient documentation

## 2017-09-01 DIAGNOSIS — R079 Chest pain, unspecified: Secondary | ICD-10-CM | POA: Diagnosis not present

## 2017-09-01 DIAGNOSIS — N181 Chronic kidney disease, stage 1: Secondary | ICD-10-CM

## 2017-09-01 DIAGNOSIS — I2699 Other pulmonary embolism without acute cor pulmonale: Secondary | ICD-10-CM | POA: Diagnosis not present

## 2017-09-01 DIAGNOSIS — I2510 Atherosclerotic heart disease of native coronary artery without angina pectoris: Secondary | ICD-10-CM | POA: Insufficient documentation

## 2017-09-01 MED ORDER — APIXABAN 2.5 MG PO TABS: 2.5000 mg | ORAL_TABLET | Freq: Two times a day (BID) | ORAL | 6 refills | Status: DC

## 2017-09-01 NOTE — Addendum Note (Signed)
Encounter addended by: Scarlette Calico, RN on: 09/01/2017 3:10 PM  Actions taken: Medication long-term status modified, Visit diagnoses modified, Order list changed, Diagnosis association updated

## 2017-09-01 NOTE — Telephone Encounter (Signed)
Called, no answer. Left a message for patient to call back for lab results. Thanks!

## 2017-09-01 NOTE — Patient Instructions (Signed)
Stop Xarelto  Start Eliquis 2.5 mg Twice daily   You have been referred to [facility] in 6 months, they will call you for an appointment

## 2017-09-01 NOTE — Progress Notes (Signed)
Advanced Heart Failure Clinic Note   Referring Physician: Dr. Jana Hakim Primary Care: Dorena Dew, FNP Primary Cardiologist: Dr. Haroldine Laws   HPI:  Carrie Watts is a 67 y.o. female with a history of HTN, DM, L breast cancer s/p mastectomy and 5 rounds of chemo with cyclophosphamide,adriamycin and Taxol) in [place] and recent admission for PE and DVT at [facility] (04/24/16-05/03/16)  She flew back from [place] 1st or 2nd week of Jan 2018. Came to ER 1/14 with R sided facial droop and difficulty speaking and sob. Workup done in the ED showed a saddle pulmonary emboli. LE venous doppler showed right LE DVT. Her PE could be related to long flight travel vs. Hypercoagulable state secondary to breast cancer. Ruled out for stroke, MRI/MRA brain unremarkable, echo with bubble study stable, carotid duplex nonacute. Started on  on Lovenox, transitioned to apixaban at discharge. Her EF was 55-60% 04/24/16.  Seen oncology clinic 05/11/16. Patient continued to have dyspnea since discharge with intermittent chest pain. Repeat CTA showed resolving clot burden. Mild cardiomegaly. Borderline, improved RIGHT heart strain (RV/LV 0.9, previously 1.2). Repeat echo 2/18 showed EF 40-45% Mild to moderate global reduction in LV systolic function; grade 1 diastolic dysfunction; mild LVH; mild MR; trace TR with mildly elevated pulmonary pressure. Normal RV function. Cath with very mild CAD  Echo (5/18) EF 50-55%. We also did CT scan and no residual PE.  Here for routine f/u. Has been doing well. Was in [place] recently had 2 episodes of atypical CP. Brief pain went away but cam back several times. Went to ER. ECG and troponin were normal. No pain since. Active around the house without CP. SBP 120-140    Cath 05/18/16  Mid RCA lesion, 10 %stenosed.  Mid LAD lesion, 40 %stenosed.  Mid Cx lesion, 10 %stenosed.  The left ventricular ejection fraction is 40-45% by visual estimate.  There is mild left ventricular  systolic dysfunction.  Vasospasm noted in the right radial artery.  LV end diastolic pressure is normal, 11 mm Hg.   Echo 04/2016: EF 55-60%, negative bubble study. Echo 05/2016: EF 40-45% Echo 08/2016: EF 50%    Past Medical History:  Diagnosis Date  . Angina of effort ([place])   . Atrial fibrillation ([place])   . Cancer (HCC)    breast  . CHF (congestive heart failure) ([place])   . Deep venous thrombosis ([place])   . Diabetes mellitus without complication ([place])   . Hypercholesterolemia   . Hypertension   . Pulmonary embolism ([place])   . Renal disorder   . Stroke [facility])     Current Outpatient Medications  Medication Sig Dispense Refill  . acetaminophen (TYLENOL) 500 MG tablet Take 1 tablet (500 mg total) by mouth every 6 (six) hours as needed. 30 tablet 0  . anastrozole (ARIMIDEX) 1 MG tablet Take 1 tablet (1 mg total) by mouth daily. 30 tablet 1  . atorvastatin (LIPITOR) 20 MG tablet TAKE 1 TABLET(20 MG) BY MOUTH AT BEDTIME 90 tablet 0  . carvedilol (COREG) 6.25 MG tablet TAKE 1 TABLET(6.25 MG) BY MOUTH TWICE DAILY WITH A MEAL 180 tablet 0  . docusate sodium (COLACE) 100 MG capsule Take 100 mg by mouth daily as needed for mild constipation.    . feeding supplement, GLUCERNA SHAKE, (GLUCERNA SHAKE) LIQD Take 237 mLs by mouth 3 (three) times daily between meals. 30 Can 0  . folic acid (FOLVITE) 761 MCG tablet Take 800 mcg by mouth every morning.     [name] glucose monitoring  kit (FREESTYLE) monitoring kit 1 each by Does not apply route 4 (four) times daily - after meals and at bedtime. 1 month Diabetic Testing Supplies for QAC-QHS accuchecks.  Diagnosis E11.65. Any brand OK 1 each 1  . losartan (COZAAR) 100 MG tablet TAKE 1 TABLET(100 MG) BY MOUTH DAILY 30 tablet 6  . Menthol, Topical Analgesic, (BIOFREEZE) 4 % GEL Apply 1 application topically 3 (three) times daily as needed. 1 Tube 5  . metFORMIN (GLUCOPHAGE) 500 MG tablet TAKE 1 TABLET(500 MG) BY MOUTH DAILY WITH BREAKFAST 90 tablet 1  .  Multiple Vitamin (MULTIVITAMIN WITH MINERALS) TABS tablet Take 1 tablet by mouth every morning.     . ondansetron (ZOFRAN) 4 MG tablet Take 1 tablet (4 mg total) by mouth every 8 (eight) hours as needed for nausea or vomiting. 30 tablet 1  . TRUE METRIX BLOOD GLUCOSE TEST test strip USE AS DIRECTED FOUR TIMES DAILY AFTER MEALS AND AT BEDTIME 300 each 0  . TRUEPLUS LANCETS 28G MISC 1 each by Does not apply route QID. 300 each 3  . XARELTO 20 MG TABS tablet TAKE 1 TABLET(20 MG) BY MOUTH DAILY WITH SUPPER 90 tablet 0   No current facility-administered medications for this encounter.     Allergies  Allergen Reactions  . Dust Mite Extract       Social History   Socioeconomic History  . Marital status: Divorced    Spouse name: Not on file  . Number of children: Not on file  . Years of education: Not on file  . Highest education level: Not on file  Occupational History  . Not on file  Social Needs  . Financial resource strain: Not on file  . Food insecurity:    Worry: Not on file    Inability: Not on file  . Transportation needs:    Medical: Not on file    Non-medical: Not on file  Tobacco Use  . Smoking status: Never Smoker  . Smokeless tobacco: Never Used  Substance and Sexual Activity  . Alcohol use: No  . Drug use: No  . Sexual activity: Not on file  Lifestyle  . Physical activity:    Days per week: Not on file    Minutes per session: Not on file  . Stress: Not on file  Relationships  . Social connections:    Talks on phone: Not on file    Gets together: Not on file    Attends religious service: Not on file    Active member of club or organization: Not on file    Attends meetings of clubs or organizations: Not on file    Relationship status: Not on file  . Intimate partner violence:    Fear of current or ex partner: Not on file    Emotionally abused: Not on file    Physically abused: Not on file    Forced sexual activity: Not on file  Other Topics Concern  .  Not on file  Social History Narrative  . Not on file      Family History  Problem Relation Age of Onset  . Heart attack Mother   . Clotting disorder Neg Hx     Vitals:   09/01/17 1427  BP: 122/78  Pulse: 72  SpO2: 97%  Weight: 200 lb (90.7 kg)   Wt Readings from Last 3 Encounters:  09/01/17 200 lb (90.7 kg)  08/30/17 199 lb (90.3 kg)  01/04/17 183 lb (83 kg)  PHYSICAL EXAM: General:  Well appearing. No resp difficulty HEENT: normal Neck: supple. no JVD. Carotids 2+ bilat; no bruits. No lymphadenopathy or thryomegaly appreciated. Cor: PMI nondisplaced. Regular rate & rhythm. No rubs, gallops or murmurs. Lungs: clear Abdomen: obese soft, nontender, nondistended. No hepatosplenomegaly. No bruits or masses. Good bowel sounds. Extremities: no cyanosis, clubbing, rash, edema Neuro: alert & orientedx3, cranial nerves grossly intact. moves all 4 extremities w/o difficulty. Affect pleasant. Mild stutterig.    ASSESSMENT & PLAN:  1. Chronic systolic HF: NICM, cath in 05/2016 with minimal CAD. ? Related to adriamycin versus stress related to massive PE - Echo 5/18 EF 50-55%. Suspect transient LV dysfunttion due to massive PE and not adriamycin toxicity.  - NYHA II. Volume status ok. - Continue Coreg 6.43m BID.  - Continue losartan to 10101mdaily with elevated BP - Continue prn lasix.  2. Saddle PE/DVT 1/18 -has completed one year of Xarelt. Will switch to Eliquis 2.44m64mID per [facility] data. She has increased risk for thromboembolic events with history of breast CA and frequently travels to [place]- Repeat chest CT 5/18 with no residual clot. No RV strain on echo  3. Breast CA  - Per Oncology. Has completed neo-adjuvant chemo including Adriamycin 4. Hypertension - Relatively well controlled. Can add amlodipine as needed 5. H/o CVA - Continue atorva. No ASA with DOAC.  6. Chest pain - doubt cardiac ECG and trop ok - only mild CAD on cath in 2018 - continue  statin. No ASA with DOAC.  7. DM2 - continue ARB and statin - continue metformin - Consider jardiance  LV function has normalized. Will have her f/u with [facility] in 6 months for f/u echo and recheck BP.   DanGlori BickersD  2:37 PM

## 2017-09-01 NOTE — Addendum Note (Signed)
Encounter addended by: Scarlette Calico, RN on: 09/01/2017 2:59 PM  Actions taken: Pharmacy for encounter modified, Order list changed, Sign clinical note

## 2017-09-01 NOTE — Progress Notes (Signed)
Orders Placed This Encounter  Procedures  . Basic metabolic panel    Standing Status:   Future    Standing Expiration Date:   09/02/2018    Donia Pounds  MSN, FNP-C [facility] [address] [place], [place] [address] [phone]

## 2017-09-01 NOTE — Telephone Encounter (Signed)
-----   Message from Dorena Dew, [place] sent at 09/01/2017  4:14 PM EDT ----- Regarding: lab results Please inform patient that that creatinine is mildly elevated, which is consistent with chronic kidney disease. Will repeat labs in 1 month.   Donia Pounds  MSN, FNP-C [facility] [address] [place], [place] [address] [phone]

## 2017-09-04 DIAGNOSIS — E119 Type 2 diabetes mellitus without complications: Secondary | ICD-10-CM | POA: Diagnosis not present

## 2017-09-05 ENCOUNTER — Other Ambulatory Visit: Payer: Self-pay | Admitting: Dermatology

## 2017-09-05 DIAGNOSIS — L821 Other seborrheic keratosis: Secondary | ICD-10-CM | POA: Diagnosis not present

## 2017-09-05 DIAGNOSIS — D485 Neoplasm of uncertain behavior of skin: Secondary | ICD-10-CM | POA: Diagnosis not present

## 2017-09-07 ENCOUNTER — Telehealth: Payer: Self-pay | Admitting: *Deleted

## 2017-09-07 DIAGNOSIS — E1122 Type 2 diabetes mellitus with diabetic chronic kidney disease: Secondary | ICD-10-CM | POA: Diagnosis not present

## 2017-09-07 DIAGNOSIS — I2699 Other pulmonary embolism without acute cor pulmonale: Secondary | ICD-10-CM | POA: Diagnosis not present

## 2017-09-07 DIAGNOSIS — N182 Chronic kidney disease, stage 2 (mild): Secondary | ICD-10-CM | POA: Diagnosis not present

## 2017-09-07 DIAGNOSIS — R300 Dysuria: Secondary | ICD-10-CM | POA: Diagnosis not present

## 2017-09-07 DIAGNOSIS — C50919 Malignant neoplasm of unspecified site of unspecified female breast: Secondary | ICD-10-CM | POA: Diagnosis not present

## 2017-09-07 DIAGNOSIS — E669 Obesity, unspecified: Secondary | ICD-10-CM | POA: Diagnosis not present

## 2017-09-07 DIAGNOSIS — N179 Acute kidney failure, unspecified: Secondary | ICD-10-CM | POA: Diagnosis not present

## 2017-09-07 DIAGNOSIS — I5042 Chronic combined systolic (congestive) and diastolic (congestive) heart failure: Secondary | ICD-10-CM | POA: Diagnosis not present

## 2017-09-07 DIAGNOSIS — I129 Hypertensive chronic kidney disease with stage 1 through stage 4 chronic kidney disease, or unspecified chronic kidney disease: Secondary | ICD-10-CM | POA: Diagnosis not present

## 2017-09-07 NOTE — Telephone Encounter (Signed)
This RN received transferred VM from scheduling left by the patient.  Message stated " I am Carrie Watts, I need an appointment for symptoms "  Pt left her DOB but not a return call number.  This RN returned call to number on demographic page - and obtained a VM of a woman speaking in another language.  This RN left message stating " call is being returned about appointment request please call back "  This RN's name and return call number given.

## 2017-09-14 ENCOUNTER — Other Ambulatory Visit: Payer: Self-pay | Admitting: Oncology

## 2017-09-22 ENCOUNTER — Ambulatory Visit (INDEPENDENT_AMBULATORY_CARE_PROVIDER_SITE_OTHER): Payer: Medicare Other | Admitting: Family Medicine

## 2017-09-22 ENCOUNTER — Encounter: Payer: Self-pay | Admitting: Family Medicine

## 2017-09-22 ENCOUNTER — Ambulatory Visit (HOSPITAL_COMMUNITY)
Admission: RE | Admit: 2017-09-22 | Discharge: 2017-09-22 | Disposition: A | Discharge status: Home / Self Care | Payer: Medicare Other | Source: Ambulatory Visit | Attending: Family Medicine | Admitting: Family Medicine

## 2017-09-22 VITALS — BP 147/81 | HR 86 | Temp 99.0°F | Resp 18 | Ht 62.0 in | Wt 196.0 lb

## 2017-09-22 DIAGNOSIS — R058 Other specified cough: Secondary | ICD-10-CM

## 2017-09-22 DIAGNOSIS — R0600 Dyspnea, unspecified: Secondary | ICD-10-CM | POA: Diagnosis not present

## 2017-09-22 DIAGNOSIS — J029 Acute pharyngitis, unspecified: Secondary | ICD-10-CM | POA: Diagnosis not present

## 2017-09-22 DIAGNOSIS — R062 Wheezing: Secondary | ICD-10-CM | POA: Diagnosis not present

## 2017-09-22 DIAGNOSIS — R05 Cough: Secondary | ICD-10-CM | POA: Insufficient documentation

## 2017-09-22 DIAGNOSIS — J069 Acute upper respiratory infection, unspecified: Secondary | ICD-10-CM

## 2017-09-22 DIAGNOSIS — R918 Other nonspecific abnormal finding of lung field: Secondary | ICD-10-CM | POA: Diagnosis not present

## 2017-09-22 LAB — POCT RAPID STREP A (OFFICE): Rapid Strep A Screen: NEGATIVE

## 2017-09-22 MED ORDER — AMOXICILLIN-POT CLAVULANATE 875-125 MG PO TABS: 1.0000 | ORAL_TABLET | Freq: Two times a day (BID) | ORAL | 0 refills | Status: AC

## 2017-09-22 MED ORDER — CHLORPHEN-PE-ACETAMINOPHEN 4-10-325 MG PO TABS: 1.0000 | ORAL_TABLET | Freq: Four times a day (QID) | ORAL | 0 refills | Status: DC | PRN

## 2017-09-22 NOTE — Progress Notes (Signed)
History of Present Illness   Patient Identification Carrie Watts is a 67 y.o. female.  Chief Complaint  Cough; Nasal Congestion; and Sore Throat   Carrie Watts, a 67 year old female with multiple comorbidities presents complaining of persistent cough, nasal congestion, fatigue, and sore throat.  Symptoms have been present for greater than 2 weeks.  Patient has attempted over-the-counter analgesics without satisfactory relief.  Patient states that cough is productive of green and sometimes blood-tinged sputum.  Also, she endorses sore throat and states that it is been difficult to swallow.  Patient denies fever, chills, chest pain, dysuria, nausea, vomiting, or diarrhea.  Other associated symptoms include shortness of breath.    Past Medical History:  Diagnosis Date  . Angina of effort ([place])   . Atrial fibrillation ([place])   . Cancer (HCC)    breast  . CHF (congestive heart failure) ([place])   . Deep venous thrombosis ([place])   . Diabetes mellitus without complication ([place])   . Hypercholesterolemia   . Hypertension   . Pulmonary embolism ([place])   . Renal disorder   . Stroke [facility])    Family History  Problem Relation Age of Onset  . Heart attack Mother   . Clotting disorder Neg Hx    Current Outpatient Medications  Medication Sig Dispense Refill  . acetaminophen (TYLENOL) 500 MG tablet Take 1 tablet (500 mg total) by mouth every 6 (six) hours as needed. 30 tablet 0  . anastrozole (ARIMIDEX) 1 MG tablet Take 1 tablet (1 mg total) by mouth daily. 30 tablet 1  . anastrozole (ARIMIDEX) 1 MG tablet TAKE 1 TABLET(1 MG) BY MOUTH DAILY 90 tablet 0  . apixaban (ELIQUIS) 2.5 MG TABS tablet Take 1 tablet (2.5 mg total) by mouth 2 (two) times daily. 60 tablet 6  . atorvastatin (LIPITOR) 20 MG tablet TAKE 1 TABLET(20 MG) BY MOUTH AT BEDTIME 90 tablet 0  . carvedilol (COREG) 6.25 MG tablet TAKE 1 TABLET(6.25 MG) BY MOUTH TWICE DAILY WITH A MEAL 180 tablet 0  . docusate sodium (COLACE) 100 MG capsule  Take 100 mg by mouth daily as needed for mild constipation.    . feeding supplement, GLUCERNA SHAKE, (GLUCERNA SHAKE) LIQD Take 237 mLs by mouth 3 (three) times daily between meals. 30 Can 0  . folic acid (FOLVITE) 284 MCG tablet Take 800 mcg by mouth every morning.     [name] glucose monitoring kit (FREESTYLE) monitoring kit 1 each by Does not apply route 4 (four) times daily - after meals and at bedtime. 1 month Diabetic Testing Supplies for QAC-QHS accuchecks.  Diagnosis E11.65. Any brand OK 1 each 1  . losartan (COZAAR) 100 MG tablet TAKE 1 TABLET(100 MG) BY MOUTH DAILY 30 tablet 6  . Menthol, Topical Analgesic, (BIOFREEZE) 4 % GEL Apply 1 application topically 3 (three) times daily as needed. 1 Tube 5  . metFORMIN (GLUCOPHAGE) 500 MG tablet TAKE 1 TABLET(500 MG) BY MOUTH DAILY WITH BREAKFAST 90 tablet 1  . Multiple Vitamin (MULTIVITAMIN WITH MINERALS) TABS tablet Take 1 tablet by mouth every morning.     . ondansetron (ZOFRAN) 4 MG tablet Take 1 tablet (4 mg total) by mouth every 8 (eight) hours as needed for nausea or vomiting. 30 tablet 1  . TRUE METRIX BLOOD GLUCOSE TEST test strip USE AS DIRECTED FOUR TIMES DAILY AFTER MEALS AND AT BEDTIME 300 each 0  . TRUEPLUS LANCETS 28G MISC 1 each by Does not apply route QID. 300 each 3   No current facility-administered  medications for this visit.    Allergies  Allergen Reactions  . Dust Mite Extract    Social History   Socioeconomic History  . Marital status: Divorced    Spouse name: Not on file  . Number of children: Not on file  . Years of education: Not on file  . Highest education level: Not on file  Occupational History  . Not on file  Social Needs  . Financial resource strain: Not on file  . Food insecurity:    Worry: Not on file    Inability: Not on file  . Transportation needs:    Medical: Not on file    Non-medical: Not on file  Tobacco Use  . Smoking status: Never Smoker  . Smokeless tobacco: Never Used  Substance and  Sexual Activity  . Alcohol use: No  . Drug use: No  . Sexual activity: Not on file  Lifestyle  . Physical activity:    Days per week: Not on file    Minutes per session: Not on file  . Stress: Not on file  Relationships  . Social connections:    Talks on phone: Not on file    Gets together: Not on file    Attends religious service: Not on file    Active member of club or organization: Not on file    Attends meetings of clubs or organizations: Not on file    Relationship status: Not on file  . Intimate partner violence:    Fear of current or ex partner: Not on file    Emotionally abused: Not on file    Physically abused: Not on file    Forced sexual activity: Not on file  Other Topics Concern  . Not on file  Social History Narrative  . Not on file  Review of Systems  Constitutional: Positive for malaise/fatigue. Negative for fever.  HENT: Negative.   Eyes: Negative.   Respiratory: Positive for cough and sputum production.   Cardiovascular: Negative.   Gastrointestinal: Negative.  Negative for abdominal pain and vomiting.  Genitourinary: Negative.   Musculoskeletal: Negative.   Skin: Negative.   Neurological: Negative.   Psychiatric/Behavioral: Negative.     Physical Exam  Physical Exam  Constitutional: She is oriented to person, place, and time. She appears well-developed. She appears distressed.  HENT:  Head: Normocephalic.  Right Ear: No drainage or tenderness. No middle ear effusion.  Left Ear: No drainage or swelling.  No middle ear effusion.  Mouth/Throat: Mucous membranes are normal. Oropharyngeal exudate and posterior oropharyngeal erythema present. Tonsils are 0 on the right. Tonsils are 0 on the left.  Eyes: Pupils are equal, round, and reactive to light.  Neck: Normal range of motion.  Cardiovascular: Normal rate and regular rhythm.  Pulmonary/Chest: Effort normal and breath sounds normal.  Abdominal: Soft. Bowel sounds are normal.  Neurological: She is  alert and oriented to person, place, and time.  Skin: Skin is warm and dry.     Sore throat - Rapid Strep A - Chlorphen-PE-Acetaminophen 4-10-325 MG TABS; Take 1 tablet by mouth every 6 (six) hours as needed.  Dispense: 20 tablet; Refill: 0  Upper respiratory tract infection, unspecified type - amoxicillin-clavulanate (AUGMENTIN) 875-125 MG tablet; Take 1 tablet by mouth 2 (two) times daily for 10 days.  Dispense: 20 tablet; Refill: 0 - Chlorphen-PE-Acetaminophen 4-10-325 MG TABS; Take 1 tablet by mouth every 6 (six) hours as needed.  Dispense: 20 tablet; Refill: 0   Productive cough  - DG Chest  2 View; Future   Wheezing Reviewed chest x-ray, which showed the following:  IMPRESSION: Question mild RIGHT infrahilar atelectasis versus infiltrate.  - DG Chest 2 View; Future  Start Augmentin 875-125 mg every 12 hours for 10 days  The patient was given clear instructions to go to ER or return to medical center if symptoms do not improve, worsen or new problems develop. The patient verbalized understanding.    Donia Pounds  MSN, FNP-C [facility] [address] [place], [place] [address] [phone]

## 2017-09-22 NOTE — Patient Instructions (Signed)
For upper respiratory infection, will start a trial of Augmentin 875-125 mg every 12 hours for 10 days.  Also, for associated upper respiratory symptoms Norel AD every 6 hours as needed for 5 days.   Recommend that you increase rest, handwashing, and fluid intake.    Your strep test was negative.  Recommend warm, salt water gargles as needed.     Upper Respiratory Infection, Adult Most upper respiratory infections (URIs) are caused by a virus. A URI affects the nose, throat, and upper air passages. The most common type of URI is often called "the common cold." Follow these instructions at home:  Take medicines only as told by your doctor.  Gargle warm saltwater or take cough drops to comfort your throat as told by your doctor.  Use a warm mist humidifier or inhale steam from a shower to increase air moisture. This may make it easier to breathe.  Drink enough fluid to keep your pee (urine) clear or pale yellow.  Eat soups and other clear broths.  Have a healthy diet.  Rest as needed.  Go back to work when your fever is gone or your doctor says it is okay. ? You may need to stay home longer to avoid giving your URI to others. ? You can also wear a face mask and wash your hands often to prevent spread of the virus.  Use your inhaler more if you have asthma.  Do not use any tobacco products, including cigarettes, chewing tobacco, or electronic cigarettes. If you need help quitting, ask your doctor. Contact a doctor if:  You are getting worse, not better.  Your symptoms are not helped by medicine.  You have chills.  You are getting more short of breath.  You have brown or red mucus.  You have yellow or brown discharge from your nose.  You have pain in your face, especially when you bend forward.  You have a fever.  You have puffy (swollen) neck glands.  You have pain while swallowing.  You have white areas in the back of your throat. Get help right away  if:  You have very bad or constant: ? Headache. ? Ear pain. ? Pain in your forehead, behind your eyes, and over your cheekbones (sinus pain). ? Chest pain.  You have long-lasting (chronic) lung disease and any of the following: ? Wheezing. ? Long-lasting cough. ? Coughing up blood. ? A change in your usual mucus.  You have a stiff neck.  You have changes in your: ? Vision. ? Hearing. ? Thinking. ? Mood. This information is not intended to replace advice given to you by your health care provider. Make sure you discuss any questions you have with your health care provider. Document Released: 09/14/2007 Document Revised: 11/29/2015 Document Reviewed: 07/03/2013 Elsevier Interactive Patient Education  2018 [employer].

## 2017-09-29 ENCOUNTER — Encounter: Payer: Self-pay | Admitting: Cardiovascular Disease

## 2017-09-29 ENCOUNTER — Ambulatory Visit (INDEPENDENT_AMBULATORY_CARE_PROVIDER_SITE_OTHER): Payer: Medicare Other | Admitting: Cardiovascular Disease

## 2017-09-29 VITALS — BP 132/82 | HR 76 | Ht 62.0 in | Wt 198.0 lb

## 2017-09-29 DIAGNOSIS — I2510 Atherosclerotic heart disease of native coronary artery without angina pectoris: Secondary | ICD-10-CM

## 2017-09-29 DIAGNOSIS — E118 Type 2 diabetes mellitus with unspecified complications: Secondary | ICD-10-CM | POA: Diagnosis not present

## 2017-09-29 DIAGNOSIS — Z7901 Long term (current) use of anticoagulants: Secondary | ICD-10-CM | POA: Diagnosis not present

## 2017-09-29 DIAGNOSIS — Z853 Personal history of malignant neoplasm of breast: Secondary | ICD-10-CM

## 2017-09-29 DIAGNOSIS — E668 Other obesity: Secondary | ICD-10-CM | POA: Diagnosis not present

## 2017-09-29 DIAGNOSIS — I5022 Chronic systolic (congestive) heart failure: Secondary | ICD-10-CM | POA: Diagnosis not present

## 2017-09-29 DIAGNOSIS — I10 Essential (primary) hypertension: Secondary | ICD-10-CM | POA: Diagnosis not present

## 2017-09-29 DIAGNOSIS — E785 Hyperlipidemia, unspecified: Secondary | ICD-10-CM

## 2017-09-29 NOTE — Progress Notes (Signed)
Cardiology Office Note    Date:  09/29/2017   ID:  Carrie Watts, DOB 03/01/1951, MRN [id number]  PCP:  Dorena Dew, FNP  Cardiologist:  Shelva Majestic, MD , Dr. Haroldine Laws (heart failure)  Initial visit with me to establish general cardiology care.  History of Present Illness:  Carrie Watts is a 67 y.o. female who is originally from [place] and has been followed by Dr. Haroldine Laws in advance heart failure clinic.  She presents now to establish general cardiology care with me.  Carrie Watts has a history of hypertension, type 2 diabetes mellitus, breast cancer and underwent mastectomy and 5 rounds of chemotherapy with cyclophosphamide, Adriamycin, and Taxol in [place].  In January 2018 she developed a saddle PE/DVT.  This felt that the PE may have been related to long flight travel versus high "hypercoagulable state secondary to breast cancer.  An MRI and MRA of her brain were negative.  An echo Doppler study with bubble study was stable and her carotid duplex evaluation did not show any acute abnormalities.  She was started on Lovenox, transition to [facility] at discharge.  In January 2018 EF was 55 to 60%.  Subsequently she continue to have dyspnea repeat CTA showed resolving clot burden.  Follow-up echo Doppler study showed EF of 40 to 45% with mild to moderate global reduction in systolic function, grade 1 diastolic dysfunction, mild LVH, mild MR, trace TR mildly elevated pulmonary pressure.  Previous right heart strain had resolved.  She had undergone cardiac catheterization in February 2018 which showed mild nonobstructive CAD and EF at that time was 40 to 45%.  EP was 11 mm.  Follow-up echo Doppler study in May 2018 showed an EF which had improved to 50 to 55%.  A CT scan did not show any residual PE.  Early May of this past year, while in [place] she had had 2 episodes of atypical chest pain.  She apparently was evaluated in the emergency room ECG and troponins were normal.  She was felt  most likely to have atypical chest pain.  She last saw Dr. Haroldine Laws on Sep 01, 2017.  He felt she was stable from a heart failure standpoint and suggested that she obtain a follow-up echo in 6 months and establish care cardiology.  She recently was evaluated by Cammie Sickle, FNP and 14 with symptoms of cough nasal congestion fatigue and sore throat.  There was questionable right hilar atelectasis versus infiltrate.  She was started on Augmentin 75/125 every 12 hours for 10 days.  Denies recent chest pain.  She denies exertional symptoms.  She notes a very rare isolated palpitation.  She admits that she snores.  She believes her sleep is restorative, denies frequent awakenings.  She now presents for cardiology evaluation.   Past Medical History:  Diagnosis Date  . Angina of effort ([place])   . Atrial fibrillation ([place])   . Cancer (HCC)    breast  . CHF (congestive heart failure) ([place])   . Deep venous thrombosis ([place])   . Diabetes mellitus without complication ([place])   . Hypercholesterolemia   . Hypertension   . Pulmonary embolism ([place])   . Renal disorder   . Stroke [facility])     Past Surgical History:  Procedure Laterality Date  . BREAST SURGERY    . COLONOSCOPY N/A 05/16/2016   Procedure: COLONOSCOPY;  Surgeon: [name], MD;  Location: [place];  Service: Endoscopy;  Laterality: N/A;  . ESOPHAGOGASTRODUODENOSCOPY N/A 05/16/2016   Procedure:  ESOPHAGOGASTRODUODENOSCOPY (EGD);  Surgeon: [name], MD;  Location: [place];  Service: Endoscopy;  Laterality: N/A;  . LEFT HEART CATH AND CORONARY ANGIOGRAPHY N/A 05/18/2016   Procedure: Left Heart Cath and Coronary Angiography;  Surgeon: Jettie Booze, MD;  Location: [place];  Service: Cardiovascular;  Laterality: N/A;  . MASTECTOMY Left 2017  . PARTIAL HYSTERECTOMY      Current Medications: Outpatient Medications Prior to Visit  Medication Sig Dispense Refill  . acetaminophen (TYLENOL) 500 MG tablet Take 1  tablet (500 mg total) by mouth every 6 (six) hours as needed. 30 tablet 0  . amoxicillin-clavulanate (AUGMENTIN) 875-125 MG tablet Take 1 tablet by mouth 2 (two) times daily for 10 days. 20 tablet 0  . anastrozole (ARIMIDEX) 1 MG tablet TAKE 1 TABLET(1 MG) BY MOUTH DAILY 90 tablet 0  . apixaban (ELIQUIS) 2.5 MG TABS tablet Take 1 tablet (2.5 mg total) by mouth 2 (two) times daily. 60 tablet 6  . atorvastatin (LIPITOR) 20 MG tablet TAKE 1 TABLET(20 MG) BY MOUTH AT BEDTIME 90 tablet 0  . carvedilol (COREG) 6.25 MG tablet TAKE 1 TABLET(6.25 MG) BY MOUTH TWICE DAILY WITH A MEAL 180 tablet 0  . Chlorphen-PE-Acetaminophen 4-10-325 MG TABS Take 1 tablet by mouth every 6 (six) hours as needed. 20 tablet 0  . docusate sodium (COLACE) 100 MG capsule Take 100 mg by mouth daily as needed for mild constipation.    . feeding supplement, GLUCERNA SHAKE, (GLUCERNA SHAKE) LIQD Take 237 mLs by mouth 3 (three) times daily between meals. 30 Can 0  . folic acid (FOLVITE) 176 MCG tablet Take 800 mcg by mouth every morning.     [name] glucose monitoring kit (FREESTYLE) monitoring kit 1 each by Does not apply route 4 (four) times daily - after meals and at bedtime. 1 month Diabetic Testing Supplies for QAC-QHS accuchecks.  Diagnosis E11.65. Any brand OK 1 each 1  . losartan (COZAAR) 100 MG tablet TAKE 1 TABLET(100 MG) BY MOUTH DAILY 30 tablet 6  . Menthol, Topical Analgesic, (BIOFREEZE) 4 % GEL Apply 1 application topically 3 (three) times daily as needed. 1 Tube 5  . metFORMIN (GLUCOPHAGE) 500 MG tablet TAKE 1 TABLET(500 MG) BY MOUTH DAILY WITH BREAKFAST 90 tablet 1  . Multiple Vitamin (MULTIVITAMIN WITH MINERALS) TABS tablet Take 1 tablet by mouth every morning.     . ondansetron (ZOFRAN) 4 MG tablet Take 1 tablet (4 mg total) by mouth every 8 (eight) hours as needed for nausea or vomiting. 30 tablet 1  . TRUE METRIX BLOOD GLUCOSE TEST test strip USE AS DIRECTED FOUR TIMES DAILY AFTER MEALS AND AT BEDTIME 300 each 0  .  TRUEPLUS LANCETS 28G MISC 1 each by Does not apply route QID. 300 each 3  . anastrozole (ARIMIDEX) 1 MG tablet Take 1 tablet (1 mg total) by mouth daily. (Patient not taking: Reported on 09/29/2017) 30 tablet 1   No facility-administered medications prior to visit.      Allergies:   Dust mite extract   Social History   Socioeconomic History  . Marital status: Divorced    Spouse name: Not on file  . Number of children: Not on file  . Years of education: Not on file  . Highest education level: Not on file  Occupational History  . Not on file  Social Needs  . Financial resource strain: Not on file  . Food insecurity:    Worry: Not on file    Inability:  Not on file  . Transportation needs:    Medical: Not on file    Non-medical: Not on file  Tobacco Use  . Smoking status: Never Smoker  . Smokeless tobacco: Never Used  Substance and Sexual Activity  . Alcohol use: No  . Drug use: No  . Sexual activity: Not on file  Lifestyle  . Physical activity:    Days per week: Not on file    Minutes per session: Not on file  . Stress: Not on file  Relationships  . Social connections:    Talks on phone: Not on file    Gets together: Not on file    Attends religious service: Not on file    Active member of club or organization: Not on file    Attends meetings of clubs or organizations: Not on file    Relationship status: Not on file  Other Topics Concern  . Not on file  Social History Narrative  . Not on file     Family History:  The patient's family history includes Heart attack in her mother.   ROS General: Negative; No fevers, chills, or night sweats;  HEENT: Negative; No changes in vision or hearing, sinus congestion, difficulty swallowing Pulmonary recent cough/congestion, improved since initiating Augmentin Cardiovascular: Negative; No chest pain, presyncope, syncope, palpitations GI: Negative; No nausea, vomiting, diarrhea, or abdominal pain GU: Negative; No dysuria,  hematuria, or difficulty voiding Musculoskeletal: Negative; no myalgias, joint pain, or weakness Hematologic/Oncology: Positive for history of breast carcinoma, status post mastectomy and chemotherapy Endocrine: Negative; no heat/cold intolerance; no diabetes Neuro: Negative; no changes in balance, headaches Skin: Negative; No rashes or skin lesions Psychiatric: Negative; No behavioral problems, depression Sleep: Negative; No snoring, daytime sleepiness, hypersomnolence, bruxism, restless legs, hypnogognic hallucinations, no cataplexy Other comprehensive 14 point system review is negative.   PHYSICAL EXAM:   VS:  BP 132/82   Pulse 76   Ht '5\' 2"'$  (1.575 m)   Wt 198 lb (89.8 kg)   BMI 36.21 kg/m     Repeat blood pressure by me was 124/80  Wt Readings from Last 3 Encounters:  09/29/17 198 lb (89.8 kg)  09/22/17 196 lb (88.9 kg)  09/01/17 200 lb (90.7 kg)    General: Alert, oriented, no distress.  Skin: normal turgor, no rashes, warm and dry HEENT: Normocephalic, atraumatic. Pupils equal round and reactive to light; sclera anicteric; extraocular muscles intact; Fundi no arteriolar narrowing.  No hemorrhages or exudates Nose without nasal septal hypertrophy Mouth/Parynx benign; Mallinpatti scale3 Neck: No JVD, no carotid bruits; normal carotid upstroke Lungs: clear to ausculatation and percussion; no wheezing or rales Chest wall: without tenderness to palpitation Heart: PMI not displaced, RRR, s1 s2 normal, 1/6 systolic murmur, no diastolic murmur, no rubs, gallops, thrills, or heaves Abdomen: soft, nontender; no hepatosplenomehaly, BS+; abdominal aorta nontender and not dilated by palpation. Back: no CVA tenderness Pulses 2+ Musculoskeletal: full range of motion, normal strength, no joint deformities Extremities: no clubbing cyanosis or edema, Homan's sign negative  Neurologic: grossly nonfocal; Cranial nerves grossly wnl Psychologic: Normal mood and affect   Studies/Labs  Reviewed:   EKG:  EKG is ordered today. ECG (independently read by me): Normal sinus rhythm at 67 bpm with an isolated PVC; normal intervals.  No ST segment changes.  Recent Labs: BMP Latest Ref Rng & Units 08/30/2017 12/20/2016 09/08/2016  Glucose 65 - 99 mg/dL 91 140 91  BUN 8 - 27 mg/dL 23 17.0 13.9  Creatinine 0.57 - 1.00 mg/dL  1.19(H) 1.1 1.0  BUN/Creat Ratio 12 - 28 19 - -  Sodium 134 - 144 mmol/L 144 141 148(H)  Potassium 3.5 - 5.2 mmol/L 4.7 4.4 4.2  Chloride 96 - 106 mmol/L 107(H) - -  CO2 20 - 29 mmol/L '22 24 27  '$ Calcium 8.7 - 10.3 mg/dL 10.5(H) 9.8 10.1     Hepatic Function Latest Ref Rng & Units 08/30/2017 12/20/2016 09/08/2016  Total Protein 6.0 - 8.5 g/dL 7.5 6.9 7.2  Albumin 3.6 - 4.8 g/dL 4.6 3.5 3.8  AST 0 - 40 IU/L '26 18 22  '$ ALT 0 - 32 IU/L '26 18 19  '$ Alk Phosphatase 39 - 117 IU/L 65 56 65  Total Bilirubin 0.0 - 1.2 mg/dL 0.5 0.52 0.45    CBC Latest Ref Rng & Units 08/30/2017 12/20/2016 09/08/2016  WBC 3.4 - 10.8 x10E3/uL 6.5 5.2 6.5  Hemoglobin 11.1 - 15.9 g/dL 14.1 11.9 11.2(L)  Hematocrit 34.0 - 46.6 % 42.3 35.7 34.5(L)  Platelets 150 - 450 x10E3/uL 238 226 242   Lab Results  Component Value Date   MCV 92 08/30/2017   MCV 89.6 12/20/2016   MCV 91.3 09/08/2016   Lab Results  Component Value Date   TSH 0.564 05/27/2016   Lab Results  Component Value Date   HGBA1C 5.7 (A) 08/30/2017     BNP No results found for: BNP  ProBNP No results found for: PROBNP   Lipid Panel     Component Value Date/Time   CHOL 165 06/03/2016 1119   TRIG 316 (H) 06/03/2016 1119   HDL 56 06/03/2016 1119   CHOLHDL 2.9 06/03/2016 1119   VLDL 63 (H) 06/03/2016 1119   LDLCALC 46 06/03/2016 1119     RADIOLOGY: Dg Chest 2 View  Result Date: 09/22/2017 CLINICAL DATA:  Wheezing, productive cough, history breast cancer EXAM: CHEST - 2 VIEW COMPARISON:  05/27/2016 FINDINGS: Borderline enlargement of cardiac silhouette. Mediastinal contours and pulmonary vascularity normal.  Question mild atelectasis versus infiltrate RIGHT infrahilar. Remaining lungs clear. No pleural effusion or pneumothorax. Surgical clips LEFT axilla. No acute osseous findings. IMPRESSION: Question mild RIGHT infrahilar atelectasis versus infiltrate. Electronically Signed   By: Lavonia Dana M.D.   On: 09/22/2017 10:19     Additional studies/ records that were reviewed today include:  Reviewed the records from Dr. Ronna Polio and Cammie Sickle.  I reviewed her echo Doppler studies and cardiac catheterization.    ASSESSMENT:    1. Chronic systolic heart failure ([place])   2. Essential hypertension   3. CAD in native artery   4. Anticoagulation adequate   5. History of breast cancer   6. Type 2 diabetes mellitus with complication, without long-term current use of insulin (HCC)   7. Hyperlipidemia with target LDL less than 70   8. Moderate obesity     PLAN:  Carrie Watts is a very pleasant 67 year old female who is originally from [place].  Permanently moved to the [place] approximately 20 years ago and is now a [place] citizen.  She has a history of hypertension, type 2 diabetes mellitus, breast carcinoma status post left breast mastectomy and had undergone chemotherapy with cyclophosphamide, Adriamycin, and Taxol.  She also history of development of a saddle pulmonary embolism and DVT and since January 2018 has been on anticoagulation therapy.  He had developed mild LV dysfunction with a EF dropping to 40 to 45% by echo Doppler study in February 2018.  Ultimately, LV function has normalized with her last echo in May  2018 showed an EF of 55 to 60%. With normal strain pattern.  Left ventricular dimensions were in the upper normal range at 51.3 at end diastole and 38.100 and systole.  There was probable very mild aortic sclerosis and trivial mitral regurgitation.  Retrospect, it was felt that her initial systolic dysfunction was related to massive PE rather than Adriamycin toxicity.  Clinically, she  appears well compensated.  Her blood pressure today on repeat by me was excellent on her medical regimen consisting of carvedilol 6.25 mg twice a day losartan 100 mg daily.  There is no signs of volume overload.  She was recently felt to have possible Ona and her symptoms have improved since initiating Augmentin on September 22, 2017.  She continues to be on long-term anticoagulation therapy is now on reduced dose Eliquis 2.5 mg twice daily per Am[facility] EXT data.  Is felt to have increased risk for future thromboembolic events with history of breast cancer and frequent travels back and forth to [place].  Her recent chest pain was atypical for ischemic symptoms and I reviewed her catheterization data which showed mild nonobstructive disease.  She is diabetic on metformin.  She is on ARB and statin.  She may be a candidate for Jardiance in light of her history of LV dysfunction if improved diabetic management is necessary.  She is on atorvastatin 20 mg daily for lipidemia.  Target LDL is less than 70 in this patient with atherosclerosis.  I recommended she undergo a follow-up echo Doppler study in December 2019.  I will see her back in follow-up and further recommendations were made at that time.  Medication Adjustments/Labs and Tests Ordered: Current medicines are reviewed at length with the patient today.  Concerns regarding medicines are outlined above.  Medication changes, Labs and Tests ordered today are listed in the Patient Instructions below. Patient Instructions  Medication Instructions:  Your physician recommends that you continue on your current medications as directed. Please refer to the Current Medication list given to you today.  Testing/Procedures: Your physician has requested that you have an echocardiogram in December 2019. Echocardiography is a painless test that uses sound waves to create images of your heart. It provides your doctor with information about the size and shape of your heart and  how well your heart's chambers and valves are working. This procedure takes approximately one hour. There are no restrictions for this procedure.  This will be done at our [facility] location:  [address]  Follow-Up: December with Dr. Claiborne Billings  Any Other Special Instructions Will Be Listed Below (If Applicable).     If you need a refill on your cardiac medications before your next appointment, please call your pharmacy.      Signed, Shelva Majestic, MD  09/29/2017 8:42 AM    [facility] [address], [place], [place], [place]  [address] Phone: [phone]

## 2017-09-29 NOTE — Patient Instructions (Signed)
Medication Instructions:  Your physician recommends that you continue on your current medications as directed. Please refer to the Current Medication list given to you today.  Testing/Procedures: Your physician has requested that you have an echocardiogram in December 2019. Echocardiography is a painless test that uses sound waves to create images of your heart. It provides your doctor with information about the size and shape of your heart and how well your heart's chambers and valves are working. This procedure takes approximately one hour. There are no restrictions for this procedure.  This will be done at our [facility] location:  [address]  Follow-Up: December with Dr. Claiborne Billings  Any Other Special Instructions Will Be Listed Below (If Applicable).     If you need a refill on your cardiac medications before your next appointment, please call your pharmacy.

## 2017-10-03 ENCOUNTER — Other Ambulatory Visit: Payer: Self-pay

## 2017-10-03 ENCOUNTER — Ambulatory Visit: Payer: Medicare Other | Attending: Family Medicine | Admitting: Speech Pathology

## 2017-10-03 DIAGNOSIS — R471 Dysarthria and anarthria: Secondary | ICD-10-CM | POA: Diagnosis not present

## 2017-10-03 NOTE — Patient Instructions (Signed)
  Slow rate of speech  Gently prolong your 1st sound  Stop and breath - speak "slow and flow"  Breath from your belly

## 2017-10-03 NOTE — Therapy (Signed)
[facility] [address] [place], [place], [address] Phone: [phone]   Fax:  [phone]  Speech Language Pathology Evaluation  Patient Details  Name: Carrie Watts MRN: [id number] Date of Birth: 1950/08/27 No data recorded  Encounter Date: 10/03/2017  End of Session - 10/03/17 1458    Visit Number  1    Number of Visits  12    Date for SLP Re-Evaluation  11/28/17    SLP Start Time  61    SLP Stop Time   1446    SLP Time Calculation (min)  44 min    Activity Tolerance  Patient tolerated treatment well       Past Medical History:  Diagnosis Date  . Angina of effort ([place])   . Atrial fibrillation ([place])   . Cancer (HCC)    breast  . CHF (congestive heart failure) ([place])   . Deep venous thrombosis ([place])   . Diabetes mellitus without complication ([place])   . Hypercholesterolemia   . Hypertension   . Pulmonary embolism ([place])   . Renal disorder   . Stroke [facility])     Past Surgical History:  Procedure Laterality Date  . BREAST SURGERY    . COLONOSCOPY N/A 05/16/2016   Procedure: COLONOSCOPY;  Surgeon: [name], MD;  Location: [facility];  Service: Endoscopy;  Laterality: N/A;  . ESOPHAGOGASTRODUODENOSCOPY N/A 05/16/2016   Procedure: ESOPHAGOGASTRODUODENOSCOPY (EGD);  Surgeon: [name], MD;  Location: [facility];  Service: Endoscopy;  Laterality: N/A;  . LEFT HEART CATH AND CORONARY ANGIOGRAPHY N/A 05/18/2016   Procedure: Left Heart Cath and Coronary Angiography;  Surgeon: Jettie Booze, MD;  Location: [facility];  Service: Cardiovascular;  Laterality: N/A;  . MASTECTOMY Left 2017  . PARTIAL HYSTERECTOMY      There were no vitals filed for this visit.  Subjective Assessment - 10/03/17 1520    Subjective  "I started chemo and I started dying"    Currently in Pain?  No/denies         SLP Evaluation [facility] - 10/03/17 0001      SLP Visit Information   SLP Received On  10/03/17    Onset Date  04/24/16    Medical Diagnosis  CVA late effects      Subjective   Patient/Family Stated Goal  "So I won't stutter anymore and staying to long to say it"      General Information   HPI  Ms. Carrie Watts, a 67 year old female with a history of DMII, hypertension,  DVT, PE, and breast cancer presents accompanied by daughter Carrie Watts for a follow up of chronic conditions.  Carrie Watts recently return from [place], which is her home.  She says that she is doing well overall. Patient continues to complain of periodic stuttering and slurred speech.  Patient has a history of strokelike symptoms and an abnormal head MRI in January 2018.  Impression of head MRI showed moderate atrophy with advanced nonacute white matter signal abnormality and she artery 1 malformation.  Patient denies headache, blurred vision, numbness to upper extremities, or recent falls    Mobility Status  walks independently      Balance Screen   Has the patient fallen in the past 6 months  No    Has the patient had a decrease in activity level because of a fear of falling?   No    Is the patient reluctant to leave their home because of a  fear of falling?   No      Prior Functional Status   Cognitive/Linguistic Baseline  Baseline deficits    Baseline deficit details  memory    Type of Home  House     Lives With  Daughter;Family    Available Support  Family    Vocation  On disability      Cognition   Overall Cognitive Status  History of cognitive impairments - at baseline      Auditory Comprehension   Overall Auditory Comprehension  Appears within functional limits for tasks assessed      Reading Comprehension   Reading Status  Not tested      Expression   Primary Mode of Expression  Verbal      Verbal Expression   Overall Verbal Expression  Appears within functional limits for tasks assessed    Initiation  No impairment    Automatic Speech  -- WFL    Level of Generative/Spontaneous Verbalization   Conversation    Repetition  No impairment    Naming  -- denies difficulty      Oral Motor/Sensory Function   Overall Oral Motor/Sensory Function  Appears within functional limits for tasks assessed    Labial Coordination  Reduced      Motor Speech   Overall Motor Speech  Impaired    Respiration  Within functional limits    Phonation  Normal    Resonance  Within functional limits    Articulation  Impaired    Level of Impairment  Conversation    Intelligibility  Intelligibility reduced    Word  75-100% accurate    Phrase  75-100% accurate    Sentence  75-100% accurate    Conversation  75-100% accurate    Motor Planning  Impaired    Level of Impairment  Sentence    Motor Speech Errors  Aware;Groping for words;Inconsistent    Effective Techniques  Slow rate;Pause                      SLP Education - 10/03/17 1443    Education Details  compensations for dysfluency    Person(s) Educated  Patient    Methods  Explanation;Demonstration;Verbal cues;Handout    Comprehension  Need further instruction         SLP Long Term Goals - 10/03/17 1511      SLP LONG TERM GOAL #1   Title  Pt will utilize compensations for dysarthria with occasional min A 10/12 sentences with occasional min A    Time  6    Period  Weeks    Status  New      SLP LONG TERM GOAL #2   Title  Pt will utilize compensations in 8 minute simple conversation with 3 or less dysfluencies with occasional min A over 2 sessions    Time  6    Period  Weeks    Status  New       Plan - 10/03/17 1517    Clinical Impression Statement  Carrie Watts,  67 y.o. female is referred by her PCP for ongoing speech difficulty since CVA like symptoms with hospitalization 04/24/16. She was seen by ST on acute care at that time and noted to have aphasia and dysfluencies. Today she presents with moderate neurogentic stutter/dysarthria. Dysarthria is characterized by initial sound prolonging and repetition, as well as final  vowel or semi vowel prolonging (which is atypical). She also presents with inconsistent and atypical facial  grimaces, lip twisting, torso twisting and closing her eyes while speaking., and other extraneous facial movements. Pt also presented with a perponderance of fillers "uh, oo, ah" inconsistently and occasionally during conversation, again atypical presentation.  She reports her dysfluencies are inconsistent and sometimes she can talk normal. I observed this today, while we had a conversation about pt's grandchildren, she was fluent for 4 turns of conversation, and another period of fluent conversation re: her hobbies are gardening and cooking, free from fillers. Diadochokinetic speech slightly slow and labored inconsistently. Rapid alternating speech tasks also slightly labored and irregular inconsistently. Pt overall hyperverbose with intelligibility judged to be 90% in quiet room. She denies word finding difficulties. Due to the inconsistent and somewhat atypical presentation, I question possible conversion.  I recommend short course of ST to maximize pt's fluency and intelligilbity for improved independence and QOL.       Patient will benefit from skilled therapeutic intervention in order to improve the following deficits and impairments:   Dysarthria and anarthria - Plan: SLP plan of care cert/re-cert    Problem List Patient Active Problem List   Diagnosis Date Noted  . Change in mole 01/04/2017  . Chronic kidney disease, stage IV (severe) ([place]) 06/08/2016  . Carcinoma of central portion of left breast in female, estrogen receptor positive ([place]) 06/07/2016  . Generalized weakness 05/27/2016  . Elevated lactic acid level 05/27/2016  . UTI (urinary tract infection) 05/27/2016  . Abdominal pain 05/27/2016  . Pulmonary embolism ([place])   . CHF (congestive heart failure) ([place])   . Essential hypertension 05/23/2016  . Benign neoplasm of ascending colon   . Uncontrolled type 2 diabetes  mellitus with complication ([place])   . Pulmonary hypertension ([place])   . Primary cancer of left breast with stage 2 nodal metastasis per American Joint Committee on Cancer 7th edition (N2) ([place])   . Abnormal CT scan, liver   . Occult blood in stools   . Abnormal echocardiogram   . Elevated troponin   . Hypoxia 05/12/2016  . Symptomatic anemia 05/11/2016  . Malignant neoplasm of overlapping sites of left breast in female, estrogen receptor positive ([place]) 05/11/2016  . Hyperlipidemia   . Saddle pulmonary embolus ([place]) 04/24/2016  . Diabetes mellitus without complication ([place]) 62/37/6283  . Stroke-like symptoms 04/24/2016  . History of cancer of left breast 04/24/2016  . Pressure injury of skin 04/24/2016    Shams Fill, Annye [place] MS, CCC-SLP 10/03/2017, 3:20 PM  [facility] [address] [place], [place], [address] Phone: [phone]   Fax:  [phone]  Name: Alanah Sakuma MRN: [id number] Date of Birth: April 25, 1950

## 2017-10-06 ENCOUNTER — Other Ambulatory Visit: Payer: Medicare Other

## 2017-10-06 DIAGNOSIS — N181 Chronic kidney disease, stage 1: Secondary | ICD-10-CM | POA: Diagnosis not present

## 2017-10-07 LAB — BASIC METABOLIC PANEL
BUN/Creatinine Ratio: 14 (ref 12–28)
BUN: 18 mg/dL (ref 8–27)
CO2: 24 mmol/L (ref 20–29)
Calcium: 10 mg/dL (ref 8.7–10.3)
Chloride: 104 mmol/L (ref 96–106)
Creatinine, Ser: 1.26 mg/dL — ABNORMAL HIGH (ref 0.57–1.00)
GFR, EST AFRICAN AMERICAN: 51 mL/min/{1.73_m2} — AB (ref >59)
GFR, EST NON AFRICAN AMERICAN: 44 mL/min/{1.73_m2} — AB (ref >59)
Glucose: 136 mg/dL — ABNORMAL HIGH (ref 65–99)
Potassium: 4.4 mmol/L (ref 3.5–5.2)
SODIUM: 142 mmol/L (ref 134–144)

## 2017-10-10 ENCOUNTER — Ambulatory Visit: Payer: Medicare Other | Attending: Family Medicine

## 2017-10-10 DIAGNOSIS — R471 Dysarthria and anarthria: Secondary | ICD-10-CM | POA: Insufficient documentation

## 2017-10-11 NOTE — Therapy (Signed)
[facility] [address] [place], [place], [address] Phone: [phone]   Fax:  [phone]  Speech Language Pathology Treatment  Patient Details  Name: Carrie Watts MRN: [id number] Date of Birth: 1951-01-15 No data recorded  Encounter Date: 10/10/2017  End of Session - 10/11/17 1002    Visit Number  2    Number of Visits  12    Date for SLP Re-Evaluation  11/28/17    SLP Start Time  1319    SLP Stop Time   1400    SLP Time Calculation (min)  41 min    Activity Tolerance  Patient tolerated treatment well       Past Medical History:  Diagnosis Date  . Angina of effort ([place])   . Atrial fibrillation ([place])   . Cancer (HCC)    breast  . CHF (congestive heart failure) ([place])   . Deep venous thrombosis ([place])   . Diabetes mellitus without complication ([place])   . Hypercholesterolemia   . Hypertension   . Pulmonary embolism ([place])   . Renal disorder   . Stroke [facility])     Past Surgical History:  Procedure Laterality Date  . BREAST SURGERY    . COLONOSCOPY N/A 05/16/2016   Procedure: COLONOSCOPY;  Surgeon: [name], MD;  Location: [facility];  Service: Endoscopy;  Laterality: N/A;  . ESOPHAGOGASTRODUODENOSCOPY N/A 05/16/2016   Procedure: ESOPHAGOGASTRODUODENOSCOPY (EGD);  Surgeon: [name], MD;  Location: [facility];  Service: Endoscopy;  Laterality: N/A;  . LEFT HEART CATH AND CORONARY ANGIOGRAPHY N/A 05/18/2016   Procedure: Left Heart Cath and Coronary Angiography;  Surgeon: Jettie Booze, MD;  Location: [facility];  Service: Cardiovascular;  Laterality: N/A;  . MASTECTOMY Left 2017  . PARTIAL HYSTERECTOMY      There were no vitals filed for this visit.  Subjective Assessment - 10/10/17 1342    Subjective  Pt reports she felt lt-sided numbness following her course of chemotherapy.    Currently in Pain?  Yes    Pain Score  2     Pain Location  Throat    Pain Orientation  Mid    Pain  Descriptors / Indicators  Sore    Pain Type  Acute pain    Pain Onset  Today    Pain Frequency  Constant    Aggravating Factors   coughing    Pain Relieving Factors  not coughing            ADULT SLP TREATMENT - 10/11/17 0001      General Information   Behavior/Cognition  Alert;Cooperative;Pleasant mood      Treatment Provided   Treatment provided  Cognitive-Linquistic      Cognitive-Linquistic Treatment   Treatment focused on  -- fluency/dysarthria    Skilled Treatment  "Sometimes I speak very normal - like my normal self before I got sick." Pt with overt s/s vocal spasm including what sounds like partial vocal fold closure on inhalation. SLP worked with pt on fluency with words and short phrases; SLP educated pt re: abdominal breathing and re-educated/revisited fluency enhancing strategies provided to her on handout from previous session. SLP targeted these strategies (slow rate, gentle onset of speech, full/adequate breath, and continuous voicing-"slow and flow") in sentence responses to picture stimuli. Pt was approx 75% successful with fluent speech using compensation. Of peculiar note was that pt became more dysfluent as session progressed. There were instances of 3-4 sentences three times during the  session that pt speech clarity and speech fluency were >95% WNL; where pt spoke at a faster rate of speech and without using fluency enhancing strategies.        Assessment / Recommendations / Plan   Plan  Continue with current plan of care      Progression Toward Goals   Progression toward goals  Progressing toward goals           SLP Long Term Goals - 10/11/17 1007      SLP LONG TERM GOAL #1   Title  Pt will utilize compensations for dysarthria with occasional min A 10/12 sentences with occasional min A    Time  6    Period  Weeks    Status  On-going      SLP LONG TERM GOAL #2   Title  Pt will utilize compensations in 8 minute simple conversation with 3 or less  dysfluencies with occasional min A over 2 sessions    Time  6    Period  Weeks    Status  On-going       Plan - 10/10/17 1350    Clinical Impression Statement  Pt with continued atypical presentation for aquired neuro disorder with what is best described as components of dysfluency, dysarthria, and apraxia of speech. This SLP also would consider a conversion disorder as a viable diagnosis due to the short pockets of WNL speech production x3 during the session today. SLP focused today's session on habitualization of fluency enhancing strategies with short verbal responses. Pt should cont skilled ST to focus on increasing pt's fluent/WNL speech, and to continue to allow SLP to skillfully listen to differentially diagnose a disorder based on neurological or a conversation basis.    Speech Therapy Frequency  2x / week    Duration  -- 6 weeks/12 therapy sessions    Treatment/Interventions  Cueing hierarchy;Oral motor exercises;SLP instruction and feedback;Compensatory strategies;Functional tasks;Compensatory techniques;Internal/external aids;Patient/family education;Multimodal communcation approach;Cognitive reorganization;Environmental controls    Potential to Achieve Goals  Fair    Potential Considerations  Other (comment) ? speech deficits due to conversion disorder       Patient will benefit from skilled therapeutic intervention in order to improve the following deficits and impairments:   Dysarthria and anarthria    Problem List Patient Active Problem List   Diagnosis Date Noted  . Change in mole 01/04/2017  . Chronic kidney disease, stage IV (severe) ([place]) 06/08/2016  . Carcinoma of central portion of left breast in female, estrogen receptor positive ([place]) 06/07/2016  . Generalized weakness 05/27/2016  . Elevated lactic acid level 05/27/2016  . UTI (urinary tract infection) 05/27/2016  . Abdominal pain 05/27/2016  . Pulmonary embolism ([place])   . CHF (congestive heart failure) ([place])    . Essential hypertension 05/23/2016  . Benign neoplasm of ascending colon   . Uncontrolled type 2 diabetes mellitus with complication ([place])   . Pulmonary hypertension ([place])   . Primary cancer of left breast with stage 2 nodal metastasis per American Joint Committee on Cancer 7th edition (N2) ([place])   . Abnormal CT scan, liver   . Occult blood in stools   . Abnormal echocardiogram   . Elevated troponin   . Hypoxia 05/12/2016  . Symptomatic anemia 05/11/2016  . Malignant neoplasm of overlapping sites of left breast in female, estrogen receptor positive ([place]) 05/11/2016  . Hyperlipidemia   . Saddle pulmonary embolus ([place]) 04/24/2016  . Diabetes mellitus without complication ([place]) 90/24/0973  . Stroke-like symptoms  04/24/2016  . History of cancer of left breast 04/24/2016  . Pressure injury of skin 04/24/2016    [facility] ,[place], CCC-SLP  10/11/2017, 10:08 AM  [facility] [address] [place], [place], [address] Phone: [phone]   Fax:  [phone]   Name: Carrie Watts MRN: [id number] Date of Birth: 01/04/1951

## 2017-10-11 NOTE — Patient Instructions (Signed)
  Please complete the assigned speech therapy homework prior to your next session and return it to the speech therapist at your next visit.  

## 2017-11-07 ENCOUNTER — Ambulatory Visit: Payer: Medicare Other

## 2017-11-10 ENCOUNTER — Ambulatory Visit: Payer: Medicare Other | Attending: Family Medicine

## 2017-11-14 ENCOUNTER — Ambulatory Visit: Payer: Medicare Other

## 2017-11-21 ENCOUNTER — Encounter: Payer: Self-pay | Admitting: Speech Pathology

## 2017-11-30 ENCOUNTER — Encounter: Payer: Self-pay | Admitting: Speech Pathology

## 2017-12-13 ENCOUNTER — Other Ambulatory Visit: Payer: Self-pay | Admitting: Oncology

## 2017-12-13 ENCOUNTER — Other Ambulatory Visit: Payer: Self-pay | Admitting: Family Medicine

## 2017-12-19 NOTE — Progress Notes (Signed)
[place]  Telephone:([phone]) [phone] Fax:(336) [phone]     ID: Carrie Watts DOB: 1950-08-28  MR#: [id number]  ZHY#:[id number]  Patient Care Team: Carrie Boast, FNP as PCP - General (Family Medicine) Carrie Watts, Carrie Dad, MD as Consulting Physician (Oncology) OTHER MD:  CHIEF COMPLAINT: Estrogen receptor positive breast cancer  CURRENT TREATMENT: Anastrozole   BREAST CANCER HISTORY: From the original intake note:  The patient tells me she herself palpated a mass in her left breast and brought it to medical attention. She underwent mammography, ultrasonography, and biopsy and then proceeded to left mastectomy and axillary lymph node dissection at [facility] in [place]. The pathology from this procedure (SFS [id number]) describes a 2.1 cm tumor, grade 2, mixed infiltrating lobular and ductal, involving multiple quadrants. The deep margin was clear. 2 of 15 lymph nodes were involved.  The patient was then treated with what I her report seems to have been doxorubicin and cyclophosphamide given every 3 weeks 4, beginning October and completed December, at which point she was started on what sounds like paclitaxel weekly. According to her account she received 1 dose of paclitaxel 04/06/2016. At that time she moved to this area to be closer to her daughter and to receive further treatment.  Of note, on 04/24/2016 she presented to the emergency room with chest pain and shortness of breath, with a CT angiogram on that date showing a saddle embolus with bilateral lobe wall and segmental pulmonary emboli. Doppler ultrasonography the next day documented a right lower extremity deep venous thrombosis. The patient was started on Lovenox and transitioned to apixaban   Her subsequent history is as detailed below  INTERVAL HISTORY: Carrie Watts returns today for follow-up and treatment of her estrogen receptor positive breast cancer accompanied by her daughter and grandbaby. She  continues on anastrozole, with good tolerance. She denies issues with hot flashes or vaginal dryness. She occasionally has shooting pains in the left breast.  Since her last visit, she underwent routine screnning unilateral right breast mammography with CAD and tomography on 08/29/2017 at [place] showing: breast density category B. There was no evidence of malignancy.    REVIEW OF SYSTEMS: Carrie Watts reports that she is doing well. She has not been walking as much, but she has been walking better than she used to. She also stays active with gardening. She also does plenty of house work. She is having pain in the 4th digit of the left hand.  She denies unusual headaches, visual changes, nausea, vomiting, or dizziness. There has been no unusual cough, phlegm production, or pleurisy. There has been no change in bowel or bladder habits. She denies unexplained fatigue or unexplained weight loss, bleeding, rash, or fever. A detailed review of systems was otherwise stable.    PAST MEDICAL HISTORY: Past Medical History:  Diagnosis Date  . Angina of effort ([place])   . Atrial fibrillation ([place])   . Cancer (HCC)    breast  . CHF (congestive heart failure) ([place])   . Deep venous thrombosis ([place])   . Diabetes mellitus without complication ([place])   . Hypercholesterolemia   . Hypertension   . Pulmonary embolism ([place])   . Renal disorder   . Stroke [facility])     PAST SURGICAL HISTORY: Past Surgical History:  Procedure Laterality Date  . BREAST SURGERY    . COLONOSCOPY N/A 05/16/2016   Procedure: COLONOSCOPY;  Surgeon: [name], MD;  Location: [place];  Service: Endoscopy;  Laterality: N/A;  .  ESOPHAGOGASTRODUODENOSCOPY N/A 05/16/2016   Procedure: ESOPHAGOGASTRODUODENOSCOPY (EGD);  Surgeon: [name], MD;  Location: [place];  Service: Endoscopy;  Laterality: N/A;  . LEFT HEART CATH AND CORONARY ANGIOGRAPHY N/A 05/18/2016   Procedure: Left Heart Cath and Coronary Angiography;  Surgeon:  Jettie Booze, MD;  Location: [facility] [facility];  Service: Cardiovascular;  Laterality: N/A;  . MASTECTOMY Left 2017  . PARTIAL HYSTERECTOMY      FAMILY HISTORY Family History  Problem Relation Age of Onset  . Heart attack Mother   . Clotting disorder Neg Hx   The patient's father died from colorectal cancer at age 30. The patient's mother died at age 77 from heart disease. The patient has 3 brothers, 1 sister. The sister was diagnosed with breast cancer in her late 27s. The patient has a niece diagnosed with breast cancer at age 60.  GYNECOLOGIC HISTORY:  No LMP recorded. Patient has had a hysterectomy. Menarche age 68, first live birth age 69. The patient is GX P5. She status post menopause but does not recall the date. She did not take hormone replacement. She never used oral contraceptives.  SOCIAL HISTORY:  The patient used to do gardening. She is originally from [place]. She is divorced. She is currently living with her daughter Carrie Watts here in [place]. 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 The patient's other children are either in [place] or [place]. The patient has 8 grandchildren. She is a Nurse, learning disability.    ADVANCED DIRECTIVES: In place   HEALTH MAINTENANCE: Social History   Tobacco Use  . Smoking status: Never Smoker  . Smokeless tobacco: Never Used  Substance Use Topics  . Alcohol use: No  . Drug use: No     Colonoscopy: Never  PAP:  Bone density: Never   Allergies  Allergen Reactions  . Dust Mite Extract     Current Outpatient Medications  Medication Sig Dispense Refill  . acetaminophen (TYLENOL) 500 MG tablet Take 1 tablet (500 mg total) by mouth every 6 (six) hours as needed. 30 tablet 0  . anastrozole (ARIMIDEX) 1 MG tablet Take 1 tablet (1 mg total) by mouth daily. 90 tablet 4  . apixaban (ELIQUIS) 2.5 MG TABS tablet Take 1 tablet (2.5 mg total) by mouth 2 (two) times daily. 60 tablet 6  . atorvastatin (LIPITOR) 20 MG tablet TAKE 1 TABLET BY MOUTH AT  BEDTIME 90 tablet 0  . carvedilol (COREG) 6.25 MG tablet TAKE 1 TABLET BY MOUTH TWICE DAILY WITH A MEAL 180 tablet 0  . Chlorphen-PE-Acetaminophen 4-10-325 MG TABS Take 1 tablet by mouth every 6 (six) hours as needed. 20 tablet 0  . docusate sodium (COLACE) 100 MG capsule Take 100 mg by mouth daily as needed for mild constipation.    . feeding supplement, GLUCERNA SHAKE, (GLUCERNA SHAKE) LIQD Take 237 mLs by mouth 3 (three) times daily between meals. 30 Can 0  . folic acid (FOLVITE) 338 MCG tablet Take 800 mcg by mouth every morning.     [name] glucose monitoring kit (FREESTYLE) monitoring kit 1 each by Does not apply route 4 (four) times daily - after meals and at bedtime. 1 month Diabetic Testing Supplies for QAC-QHS accuchecks.  Diagnosis E11.65. Any brand OK 1 each 1  . losartan (COZAAR) 100 MG tablet TAKE 1 TABLET(100 MG) BY MOUTH DAILY 30 tablet 6  . Menthol, Topical Analgesic, (BIOFREEZE) 4 % GEL Apply 1 application topically 3 (three) times daily as needed. 1 Tube 5  . metFORMIN (GLUCOPHAGE) 500  MG tablet TAKE 1 TABLET(500 MG) BY MOUTH DAILY WITH BREAKFAST 90 tablet 1  . Multiple Vitamin (MULTIVITAMIN WITH MINERALS) TABS tablet Take 1 tablet by mouth every morning.     . ondansetron (ZOFRAN) 4 MG tablet Take 1 tablet (4 mg total) by mouth every 8 (eight) hours as needed for nausea or vomiting. 30 tablet 1  . TRUE METRIX BLOOD GLUCOSE TEST test strip USE AS DIRECTED FOUR TIMES DAILY AFTER MEALS AND AT BEDTIME 300 each 0  . TRUEPLUS LANCETS 28G MISC 1 each by Does not apply route QID. 300 each 3   No current facility-administered medications for this visit.     OBJECTIVE: Middle-aged African-American woman Who appears stated age  There were no vitals filed for this visit.   There is no height or weight on file to calculate BMI.    ECOG FS:2 - Symptomatic, <50% confined to bed  Sclerae unicteric, EOMs intact No cervical or supraclavicular adenopathy Lungs no rales or rhonchi Heart regular  rate and rhythm Abd soft, nontender, positive bowel sounds MSK no focal spinal tenderness, no upper extremity lymphedema Neuro: nonfocal, well oriented, appropriate affect Breasts: The right breast is benign.  The left breast has undergone mastectomy.  There is no evidence of local recurrence.  Both axillae are benign.  LAB RESULTS:  CMP     Component Value Date/Time   NA 140 12/20/2017 1443   NA 142 10/06/2017 1059   NA 141 12/20/2016 0755   K 4.4 12/20/2017 1443   K 4.4 12/20/2016 0755   CL 108 12/20/2017 1443   CO2 23 12/20/2017 1443   CO2 24 12/20/2016 0755   GLUCOSE 87 12/20/2017 1443   GLUCOSE 140 12/20/2016 0755   BUN 17 12/20/2017 1443   BUN 18 10/06/2017 1059   BUN 17.0 12/20/2016 0755   CREATININE 1.16 (H) 12/20/2017 1443   CREATININE 1.1 12/20/2016 0755   CALCIUM 9.7 12/20/2017 1443   CALCIUM 9.8 12/20/2016 0755   PROT 7.0 12/20/2017 1443   PROT 7.5 08/30/2017 1127   PROT 6.9 12/20/2016 0755   ALBUMIN 3.6 12/20/2017 1443   ALBUMIN 4.6 08/30/2017 1127   ALBUMIN 3.5 12/20/2016 0755   AST 25 12/20/2017 1443   AST 18 12/20/2016 0755   ALT 20 12/20/2017 1443   ALT 18 12/20/2016 0755   ALKPHOS 66 12/20/2017 1443   ALKPHOS 56 12/20/2016 0755   BILITOT 0.4 12/20/2017 1443   BILITOT 0.5 08/30/2017 1127   BILITOT 0.52 12/20/2016 0755   GFRNONAA 47 (L) 12/20/2017 1443   GFRNONAA 52 (L) 09/07/2016 1021   GFRAA 55 (L) 12/20/2017 1443   GFRAA 59 (L) 09/07/2016 1021    INo results found for: SPEP, UPEP  Lab Results  Component Value Date   WBC 5.2 12/20/2017   NEUTROABS 3.3 12/20/2017   HGB 11.9 12/20/2017   HCT 36.0 12/20/2017   MCV 92.0 12/20/2017   PLT 186 12/20/2017      Chemistry      Component Value Date/Time   NA 140 12/20/2017 1443   NA 142 10/06/2017 1059   NA 141 12/20/2016 0755   K 4.4 12/20/2017 1443   K 4.4 12/20/2016 0755   CL 108 12/20/2017 1443   CO2 23 12/20/2017 1443   CO2 24 12/20/2016 0755   BUN 17 12/20/2017 1443   BUN 18  10/06/2017 1059   BUN 17.0 12/20/2016 0755   CREATININE 1.16 (H) 12/20/2017 1443   CREATININE 1.1 12/20/2016 0755  Component Value Date/Time   CALCIUM 9.7 12/20/2017 1443   CALCIUM 9.8 12/20/2016 0755   ALKPHOS 66 12/20/2017 1443   ALKPHOS 56 12/20/2016 0755   AST 25 12/20/2017 1443   AST 18 12/20/2016 0755   ALT 20 12/20/2017 1443   ALT 18 12/20/2016 0755   BILITOT 0.4 12/20/2017 1443   BILITOT 0.5 08/30/2017 1127   BILITOT 0.52 12/20/2016 0755       No results found for: LABCA2  No components found for: LABCA125  No results for input(s): INR in the last 168 hours.  Urinalysis    Component Value Date/Time   COLORURINE YELLOW 05/27/2016 0853   APPEARANCEUR CLEAR 05/27/2016 0853   LABSPEC 1.010 12/28/2016 1033   PHURINE 5.5 12/28/2016 1033   GLUCOSEU NEGATIVE 12/28/2016 1033   HGBUR TRACE (A) 12/28/2016 1033   BILIRUBINUR NEG 08/30/2017 1036   KETONESUR NEGATIVE 12/28/2016 1033   PROTEINUR Negative 08/30/2017 1036   PROTEINUR NEGATIVE 12/28/2016 1033   UROBILINOGEN 0.2 08/30/2017 1036   UROBILINOGEN 0.2 12/28/2016 1033   NITRITE NEG 08/30/2017 1036   NITRITE NEGATIVE 12/28/2016 1033   LEUKOCYTESUR Negative 08/30/2017 1036     STUDIES: Since her last visit, she underwent routine screnning unilateral right breast mammography with CAD and tomography on 08/29/2017 at [place] showing: breast density category B. There was no evidence of malignancy.   ELIGIBLE FOR AVAILABLE RESEARCH PROTOCOL: no  ASSESSMENT: 67 y.o. [place] woman originally from [place], status post left mastectomy and sentinel lymph node sampling 07/29/2015 for a pT2 pN1, stage IIB invasive breast cancer, mixed ductal and lobular, strongly estrogen and progesterone receptor positive, HER-2 negative by FISH  (1) she received adjuvant chemotherapy consisting (most likely) of cyclophosphamide and doxorubicin 4 given between October and December 2017, then started paclitaxel 04/06/2016,  discontinued after one dose because of neuropathy and other complications  (2) considered adjuvant radiation, decided against it due to delay  (3) anastrozole started 05/11/2016  (a) bone density 07/13/2016 normal with a T score of -0.2  (4) pulmonary embolus and right lower extremity deep vein thrombosis diagnosed 04/24/2016,  (a) status post enoxaparin 04/24/2016 through 05/03/2016  (b) transitioned to apixaban 05/03/2016  (5) status post stroke with moderate residual  PLAN: Emmalena is now 2-1/2 years out from definitive surgery for breast cancer with no evidence of disease recurrence.  This is very favorable.  She is tolerating anastrozole well and the plan will be to continue that for total of 5 years.  She will have a repeat bone density next year when she has her mammogram  She knows to call for any other issues that may develop before the next visit.  Keontae Levingston, Carrie Dad, MD  12/20/17 3:34 PM Medical Oncology and Hematology [facility] [address] [place], [place] [address] Tel. [phone]    Fax. [phone]  Alice Rieger, am acting as scribe for Chauncey Cruel MD.  I, Lurline Del MD, have reviewed the above documentation for accuracy and completeness, and I agree with the above.

## 2017-12-20 ENCOUNTER — Inpatient Hospital Stay: Payer: Medicare Other

## 2017-12-20 ENCOUNTER — Inpatient Hospital Stay: Payer: Medicare Other | Attending: Oncology | Admitting: Oncology

## 2017-12-20 VITALS — BP 168/84 | HR 67 | Temp 98.5°F | Resp 19 | Ht 62.0 in | Wt 199.6 lb

## 2017-12-20 DIAGNOSIS — C773 Secondary and unspecified malignant neoplasm of axilla and upper limb lymph nodes: Secondary | ICD-10-CM

## 2017-12-20 DIAGNOSIS — C779 Secondary and unspecified malignant neoplasm of lymph node, unspecified: Secondary | ICD-10-CM

## 2017-12-20 DIAGNOSIS — C50812 Malignant neoplasm of overlapping sites of left female breast: Secondary | ICD-10-CM

## 2017-12-20 DIAGNOSIS — C50912 Malignant neoplasm of unspecified site of left female breast: Secondary | ICD-10-CM

## 2017-12-20 DIAGNOSIS — Z7901 Long term (current) use of anticoagulants: Secondary | ICD-10-CM | POA: Insufficient documentation

## 2017-12-20 DIAGNOSIS — Z9012 Acquired absence of left breast and nipple: Secondary | ICD-10-CM | POA: Diagnosis not present

## 2017-12-20 DIAGNOSIS — I2699 Other pulmonary embolism without acute cor pulmonale: Secondary | ICD-10-CM | POA: Diagnosis not present

## 2017-12-20 DIAGNOSIS — I82401 Acute embolism and thrombosis of unspecified deep veins of right lower extremity: Secondary | ICD-10-CM

## 2017-12-20 DIAGNOSIS — Z170 Estrogen receptor positive status [ER+]: Principal | ICD-10-CM

## 2017-12-20 DIAGNOSIS — C50112 Malignant neoplasm of central portion of left female breast: Secondary | ICD-10-CM

## 2017-12-20 LAB — CBC WITH DIFFERENTIAL/PLATELET
BASOS ABS: 0 10*3/uL (ref 0.0–0.1)
BASOS PCT: 1 %
EOS ABS: 0.3 10*3/uL (ref 0.0–0.5)
Eosinophils Relative: 5 %
HCT: 36 % (ref 34.8–46.6)
HEMOGLOBIN: 11.9 g/dL (ref 11.6–15.9)
Lymphocytes Relative: 21 %
Lymphs Abs: 1.1 10*3/uL (ref 0.9–3.3)
MCH: 30.4 pg (ref 25.1–34.0)
MCHC: 33 g/dL (ref 31.5–36.0)
MCV: 92 fL (ref 79.5–101.0)
MONOS PCT: 11 %
Monocytes Absolute: 0.6 10*3/uL (ref 0.1–0.9)
NEUTROS PCT: 62 %
Neutro Abs: 3.3 10*3/uL (ref 1.5–6.5)
Platelets: 186 10*3/uL (ref 145–400)
RBC: 3.92 MIL/uL (ref 3.70–5.45)
RDW: 14.1 % (ref 11.2–14.5)
WBC: 5.2 10*3/uL (ref 3.9–10.3)

## 2017-12-20 LAB — COMPREHENSIVE METABOLIC PANEL
ALK PHOS: 66 U/L (ref 38–126)
ALT: 20 U/L (ref 0–44)
AST: 25 U/L (ref 15–41)
Albumin: 3.6 g/dL (ref 3.5–5.0)
Anion gap: 9 (ref 5–15)
BUN: 17 mg/dL (ref 8–23)
CALCIUM: 9.7 mg/dL (ref 8.9–10.3)
CO2: 23 mmol/L (ref 22–32)
CREATININE: 1.16 mg/dL — AB (ref 0.44–1.00)
Chloride: 108 mmol/L (ref 98–111)
GFR calc non Af Amer: 47 mL/min — ABNORMAL LOW (ref >60)
GFR, EST AFRICAN AMERICAN: 55 mL/min — AB (ref >60)
Glucose, Bld: 87 mg/dL (ref 70–99)
Potassium: 4.4 mmol/L (ref 3.5–5.1)
SODIUM: 140 mmol/L (ref 135–145)
Total Bilirubin: 0.4 mg/dL (ref 0.3–1.2)
Total Protein: 7 g/dL (ref 6.5–8.1)

## 2017-12-20 MED ORDER — ANASTROZOLE 1 MG PO TABS: 1.0000 mg | ORAL_TABLET | Freq: Every day | ORAL | 4 refills | Status: DC

## 2017-12-22 DIAGNOSIS — E1129 Type 2 diabetes mellitus with other diabetic kidney complication: Secondary | ICD-10-CM | POA: Diagnosis not present

## 2017-12-22 DIAGNOSIS — N182 Chronic kidney disease, stage 2 (mild): Secondary | ICD-10-CM | POA: Diagnosis not present

## 2018-03-02 ENCOUNTER — Encounter: Payer: Self-pay | Admitting: Family Medicine

## 2018-03-02 ENCOUNTER — Ambulatory Visit (INDEPENDENT_AMBULATORY_CARE_PROVIDER_SITE_OTHER): Payer: Medicare Other | Admitting: Family Medicine

## 2018-03-02 VITALS — BP 144/71 | HR 64 | Temp 98.6°F | Resp 16 | Ht 62.0 in | Wt 202.0 lb

## 2018-03-02 DIAGNOSIS — Z0000 Encounter for general adult medical examination without abnormal findings: Secondary | ICD-10-CM | POA: Diagnosis not present

## 2018-03-02 DIAGNOSIS — I639 Cerebral infarction, unspecified: Secondary | ICD-10-CM | POA: Diagnosis not present

## 2018-03-02 DIAGNOSIS — N182 Chronic kidney disease, stage 2 (mild): Secondary | ICD-10-CM | POA: Diagnosis not present

## 2018-03-02 DIAGNOSIS — E1129 Type 2 diabetes mellitus with other diabetic kidney complication: Secondary | ICD-10-CM | POA: Diagnosis not present

## 2018-03-02 DIAGNOSIS — E119 Type 2 diabetes mellitus without complications: Secondary | ICD-10-CM

## 2018-03-02 DIAGNOSIS — Z23 Encounter for immunization: Secondary | ICD-10-CM

## 2018-03-02 LAB — POCT GLYCOSYLATED HEMOGLOBIN (HGB A1C): Hemoglobin A1C: 5.6 % (ref 4.0–5.6)

## 2018-03-02 MED ORDER — ATORVASTATIN CALCIUM 20 MG PO TABS
20.0000 mg | ORAL_TABLET | Freq: Every day | ORAL | 3 refills | Status: DC
Start: 2018-03-02 — End: 2018-05-02

## 2018-03-02 MED ORDER — CARVEDILOL 6.25 MG PO TABS: ORAL_TABLET | ORAL | 3 refills | Status: DC

## 2018-03-02 NOTE — Patient Instructions (Addendum)
  Ms. Caffey , Thank you for taking time to come for your Medicare Wellness Visit. I appreciate your ongoing commitment to your health goals. Please review the following plan we discussed and let me know if I can assist you in the future.   These are the goals we discussed: Goals    . Prevent falls       This is a list of the screening recommended for you and due dates:  Health Maintenance  Topic Date Due  . Tetanus Vaccine  12/25/1967  . Complete foot exam   05/23/2017  . Hemoglobin A1C  03/02/2018  . Eye exam for diabetics  04/11/2018  . Mammogram  08/30/2019  . Colon Cancer Screening  05/16/2026  . DEXA scan (bone density measurement)  Completed  .  Hepatitis C: One time screening is recommended by Center for Disease Control  (CDC) for  adults born from [birth year] through 1967.   Completed  . Pneumonia vaccines  Completed

## 2018-03-02 NOTE — Progress Notes (Signed)
Subjective:    Carrie Watts is a 67 y.o. female who presents for Medicare Annual/Subsequent preventive examination.  Patient with a history of CVA and multiple pulmonary emboli.  Patient reports mild residual weakness and stuttering since her incident.  Patient with a history of estrogen receptor positive breast cancer of the left breast.  She is currently under treatment by Dr. Jana Hakim.  Current treatment includes anastrozole.  She received a left breast mastectomy while in [place]. On April 24, 2016 she presented to the  emergency room due to chest pain shortness of breath.  CT angiogram showed saddle embolus with bilateral lobe wall and segmental pulmonary emboli.  Doppler ultrasonography showed right lower extremity deep vein thrombosis.  Preventive Screening-Counseling & Management  Tobacco Social History   Tobacco Use  Smoking Status Never Smoker  Smokeless Tobacco Never Used    Current Problems (verified) Patient Active Problem List   Diagnosis Date Noted  . Change in mole 01/04/2017  . Chronic kidney disease, stage IV (severe) ([place]) 06/08/2016  . Carcinoma of central portion of left breast in female, estrogen receptor positive ([place]) 06/07/2016  . Generalized weakness 05/27/2016  . Elevated lactic acid level 05/27/2016  . UTI (urinary tract infection) 05/27/2016  . Abdominal pain 05/27/2016  . Pulmonary embolism ([place])   . CHF (congestive heart failure) ([place])   . Essential hypertension 05/23/2016  . Benign neoplasm of ascending colon   . Uncontrolled type 2 diabetes mellitus with complication ([place])   . Pulmonary hypertension ([place])   . Primary cancer of left breast with stage 2 nodal metastasis per American Joint Committee on Cancer 7th edition (N2) ([place])   . Abnormal CT scan, liver   . Occult blood in stools   . Abnormal echocardiogram   . Elevated troponin   . Hypoxia 05/12/2016  . Symptomatic anemia 05/11/2016  . Malignant neoplasm of overlapping sites of left breast  in female, estrogen receptor positive ([place]) 05/11/2016  . Hyperlipidemia   . Saddle pulmonary embolus ([place]) 04/24/2016  . Diabetes mellitus without complication ([place]) 09/60/4540  . Stroke-like symptoms 04/24/2016  . History of cancer of left breast 04/24/2016  . Pressure injury of skin 04/24/2016    Current Medications (verified) Current Outpatient Medications  Medication Sig Dispense Refill  . acetaminophen (TYLENOL) 500 MG tablet Take 1 tablet (500 mg total) by mouth every 6 (six) hours as needed. 30 tablet 0  . anastrozole (ARIMIDEX) 1 MG tablet Take 1 tablet (1 mg total) by mouth daily. 90 tablet 4  . apixaban (ELIQUIS) 2.5 MG TABS tablet Take 1 tablet (2.5 mg total) by mouth 2 (two) times daily. 60 tablet 6  . atorvastatin (LIPITOR) 20 MG tablet Take 1 tablet (20 mg total) by mouth at bedtime. 90 tablet 3  . carvedilol (COREG) 6.25 MG tablet TAKE 1 TABLET BY MOUTH TWICE DAILY WITH A MEAL 180 tablet 3  . docusate sodium (COLACE) 100 MG capsule Take 100 mg by mouth daily as needed for mild constipation.    . feeding supplement, GLUCERNA SHAKE, (GLUCERNA SHAKE) LIQD Take 237 mLs by mouth 3 (three) times daily between meals. 30 Can 0  . folic acid (FOLVITE) 981 MCG tablet Take 800 mcg by mouth every morning.     [name] glucose monitoring kit (FREESTYLE) monitoring kit 1 each by Does not apply route 4 (four) times daily - after meals and at bedtime. 1 month Diabetic Testing Supplies for QAC-QHS accuchecks.  Diagnosis E11.65. Any brand OK 1 each 1  . losartan (  COZAAR) 100 MG tablet TAKE 1 TABLET(100 MG) BY MOUTH DAILY 30 tablet 6  . Menthol, Topical Analgesic, (BIOFREEZE) 4 % GEL Apply 1 application topically 3 (three) times daily as needed. 1 Tube 5  . Multiple Vitamin (MULTIVITAMIN WITH MINERALS) TABS tablet Take 1 tablet by mouth every morning.     . TRUE METRIX BLOOD GLUCOSE TEST test strip USE AS DIRECTED FOUR TIMES DAILY AFTER MEALS AND AT BEDTIME 300 each 0  . TRUEPLUS LANCETS 28G MISC 1  each by Does not apply route QID. 300 each 3  . ondansetron (ZOFRAN) 4 MG tablet Take 1 tablet (4 mg total) by mouth every 8 (eight) hours as needed for nausea or vomiting. (Patient not taking: Reported on 03/02/2018) 30 tablet 1   No current facility-administered medications for this visit.      Allergies (verified) Dust mite extract   PAST HISTORY  Family History Family History  Problem Relation Age of Onset  . Heart attack Mother   . Clotting disorder Neg Hx     Social History Social History   Tobacco Use  . Smoking status: Never Smoker  . Smokeless tobacco: Never Used  Substance Use Topics  . Alcohol use: No    Are there smokers in your home (other than you)?  No  Risk Factors Current exercise habits: The patient does not participate in regular exercise at present.  Dietary issues discussed: Healthy eating habits discussed.    Cardiac risk factors: advanced age (older than 49 for men, 18 for women), diabetes mellitus, family history of premature cardiovascular disease, obesity (BMI >= 30 kg/m2) and sedentary lifestyle.  Depression Screen (Note: if answer to either of the following is "Yes", a more complete depression screening is indicated)   Q1: Over the past two weeks, have you felt down, depressed or hopeless? Yes  Q2: Over the past two weeks, have you felt little interest or pleasure in doing things? No  Have you lost interest or pleasure in daily life? Yes  Do you often feel hopeless? Yes  Do you cry easily over simple problems? No     Office Visit from 05/23/2016 in [facility]  PHQ-9 Total Score  11       Activities of Daily Living In your present state of health, do you have any difficulty performing the following activities?:  Driving? No Managing money?  No Feeding yourself? No Getting from bed to chair? No Climbing a flight of stairs? No Preparing food and eating?: No Bathing or showering? No Getting dressed: No Getting to  the toilet? No Using the toilet:No Moving around from place to place: No In the past year have you fallen or had a near fall?:Yes   Are you sexually active?  No  Do you have more than one partner?  No  Hearing Difficulties: No Do you often ask people to speak up or repeat themselves? Yes Do you experience ringing or noises in your ears? No Do you have difficulty understanding soft or whispered voices? No  Screening Results:    Right Ear: Pass Left Ear: Pass  Vision readings for this visit:  [facility] Distance [place] [facility] Distance [facility] Near [place] [facility] Near cc IOP  Right              Left                Do you feel that you have a problem with memory? No  Do you often misplace  items? No  Do you feel safe at home?  Yes  Cognitive Testing  Alert? Yes  Normal Appearance?Yes  Oriented to person? Yes  Place? Yes   Time? Yes  Recall of three objects?  Yes  Can perform simple calculations? Yes  Displays appropriate judgment?Yes  Can read the correct time from a watch face?Yes   Advanced Directives have been discussed with the patient? Yes   Indicate any recent Medical Services you may have received from other than [facility] providers in the past year (date may be approximate).  Immunization History  Administered Date(s) Administered  . Influenza,inj,Quad PF,6+ Mos 05/15/2016, 03/02/2018  . Pneumococcal Conjugate-13 08/30/2017  . Pneumococcal Polysaccharide-23 05/15/2016  . Tdap 03/02/2018    Screening Tests Health Maintenance  Topic Date Due  . OPHTHALMOLOGY EXAM  04/11/2018  . HEMOGLOBIN A1C  08/31/2018  . FOOT EXAM  03/03/2019  . MAMMOGRAM  08/30/2019  . COLONOSCOPY  05/16/2026  . TETANUS/TDAP  03/02/2028  . DEXA SCAN  Completed  . Hepatitis C Screening  Completed  . PNA vac Low Risk Adult  Completed    All answers were reviewed with the patient and necessary referrals were made:  History reviewed: allergies, current medications, past family history, past medical history, past  social history, past surgical history and problem list  Review of Systems Review of Systems  Constitutional: Negative.   HENT: Negative.   Eyes: Negative.   Respiratory: Negative.   Cardiovascular: Negative.   Gastrointestinal: Negative.   Genitourinary: Negative.   Musculoskeletal: Negative.   Skin: Negative.   Neurological: Negative.   Psychiatric/Behavioral: Negative.       Objective:     Physical Exam  Constitutional: She is oriented to person, place, and time and well-developed, well-nourished, and in no distress.  HENT:  Head: Normocephalic and atraumatic.  Eyes: Pupils are equal, round, and reactive to light. Conjunctivae and EOM are normal.  Neck: Normal range of motion. Neck supple.  Cardiovascular: Normal rate, regular rhythm and normal heart sounds.  Pulmonary/Chest: Effort normal and breath sounds normal. No respiratory distress.  Musculoskeletal: She exhibits edema.  Lymphadenopathy:    She has no cervical adenopathy.  Neurological: She is alert and oriented to person, place, and time. Gait normal.  Skin: Skin is warm and dry.  Psychiatric: Mood, memory, affect and judgment normal.    Body mass index is 36.95 kg/m.      Assessment:     1. Cerebrovascular accident (CVA), unspecified mechanism ([place]) Patient's daughter requesting due to hx of CVA.  - Ambulatory referral to Neurology  2. Medicare annual wellness visit, subsequent Completed today.   3. Type 2 diabetes mellitus without complication, without long-term current use of insulin (HCC) No medication changes warranted. Discussed increasing activity.  - HgB A1c - HM Diabetes Foot Exam  4. Need for immunization against influenza Influenza vaccination given in the office visit today.  - Flu Vaccine QUAD 36+ mos IM      Plan:     During the course of the visit the patient was educated and counseled about appropriate screening and preventive services including:    Pneumococcal vaccine    Influenza vaccine  Hepatitis B vaccine  Td vaccine  Nutrition counseling   Advanced directives: has NO advanced directive  - add't info requested. Referral to SW: yes  Diet review for nutrition referral? Yes ____  Not Indicated __x__   Patient Instructions (the written plan) was given to the patient.  Medicare Attestation I have personally  reviewed: The patient's medical and social history Their use of alcohol, tobacco or illicit drugs Their current medications and supplements The patient's functional ability including ADLs,fall risks, home safety risks, cognitive, and hearing and visual impairment Diet and physical activities Evidence for depression or mood disorders  The patient's weight, height, BMI, and visual acuity have been recorded in the chart.  I have made referrals, counseling, and provided education to the patient based on review of the above and I have provided the patient with a written personalized care plan for preventive services.    Ms. Doug Sou. Nathaneil Canary, FNP-BC [facility] [address] [place], [place] [address] [phone]  03/02/2018

## 2018-03-06 DIAGNOSIS — E669 Obesity, unspecified: Secondary | ICD-10-CM | POA: Diagnosis not present

## 2018-03-06 DIAGNOSIS — N179 Acute kidney failure, unspecified: Secondary | ICD-10-CM | POA: Diagnosis not present

## 2018-03-06 DIAGNOSIS — I129 Hypertensive chronic kidney disease with stage 1 through stage 4 chronic kidney disease, or unspecified chronic kidney disease: Secondary | ICD-10-CM | POA: Diagnosis not present

## 2018-03-06 DIAGNOSIS — R300 Dysuria: Secondary | ICD-10-CM | POA: Diagnosis not present

## 2018-03-06 DIAGNOSIS — I5042 Chronic combined systolic (congestive) and diastolic (congestive) heart failure: Secondary | ICD-10-CM | POA: Diagnosis not present

## 2018-03-06 DIAGNOSIS — E1122 Type 2 diabetes mellitus with diabetic chronic kidney disease: Secondary | ICD-10-CM | POA: Diagnosis not present

## 2018-03-06 DIAGNOSIS — N182 Chronic kidney disease, stage 2 (mild): Secondary | ICD-10-CM | POA: Diagnosis not present

## 2018-03-06 DIAGNOSIS — I2699 Other pulmonary embolism without acute cor pulmonale: Secondary | ICD-10-CM | POA: Diagnosis not present

## 2018-03-06 DIAGNOSIS — C50919 Malignant neoplasm of unspecified site of unspecified female breast: Secondary | ICD-10-CM | POA: Diagnosis not present

## 2018-03-22 ENCOUNTER — Ambulatory Visit (HOSPITAL_COMMUNITY): Payer: Medicare Other | Attending: Cardiovascular Disease

## 2018-03-22 ENCOUNTER — Other Ambulatory Visit: Payer: Self-pay

## 2018-03-22 DIAGNOSIS — I5022 Chronic systolic (congestive) heart failure: Secondary | ICD-10-CM

## 2018-03-28 ENCOUNTER — Encounter: Payer: Self-pay | Admitting: Cardiovascular Disease

## 2018-03-28 ENCOUNTER — Ambulatory Visit (INDEPENDENT_AMBULATORY_CARE_PROVIDER_SITE_OTHER): Payer: Medicare Other | Admitting: Cardiovascular Disease

## 2018-03-28 VITALS — BP 130/70 | HR 72 | Ht 62.0 in | Wt 197.8 lb

## 2018-03-28 DIAGNOSIS — Z7901 Long term (current) use of anticoagulants: Secondary | ICD-10-CM

## 2018-03-28 DIAGNOSIS — I5189 Other ill-defined heart diseases: Secondary | ICD-10-CM

## 2018-03-28 DIAGNOSIS — I519 Heart disease, unspecified: Secondary | ICD-10-CM | POA: Diagnosis not present

## 2018-03-28 DIAGNOSIS — Z853 Personal history of malignant neoplasm of breast: Secondary | ICD-10-CM

## 2018-03-28 DIAGNOSIS — I10 Essential (primary) hypertension: Secondary | ICD-10-CM

## 2018-03-28 DIAGNOSIS — E668 Other obesity: Secondary | ICD-10-CM

## 2018-03-28 DIAGNOSIS — E785 Hyperlipidemia, unspecified: Secondary | ICD-10-CM | POA: Diagnosis not present

## 2018-03-28 DIAGNOSIS — I2510 Atherosclerotic heart disease of native coronary artery without angina pectoris: Secondary | ICD-10-CM

## 2018-03-28 NOTE — Patient Instructions (Signed)
Medication Instructions:  Your physician recommends that you continue on your current medications as directed. Please refer to the Current Medication list given to you today.  If you need a refill on your cardiac medications before your next appointment, please call your pharmacy.   Follow-Up: At [facility], you and your health needs are our priority.  As part of our continuing mission to provide you with exceptional heart care, we have created designated Provider Care Teams.  These Care Teams include your primary Cardiologist (physician) and Advanced Practice Providers (APPs -  Physician Assistants and Nurse Practitioners) who all work together to provide you with the care you need, when you need it. You will need a follow up appointment in 12 months.  Please call our office 2 months in advance to schedule this appointment.  You may see Dr. Kelly or one of the following Advanced Practice Providers on your designated Care Team: Hao Meng, PA-C . Angela Duke, PA-C  

## 2018-03-28 NOTE — Progress Notes (Signed)
Cardiology Office Note    Date:  03/30/2018   ID:  Carrie Watts, DOB [birth year], MRN [id number]  PCP:  Lanae Boast, FNP  Cardiologist:  Shelva Majestic, MD , Dr. Haroldine Laws (heart failure)    History of Present Illness:  Carrie Watts is a 67 y.o. female who is originally from [place] who has been followed by Dr. Haroldine Laws in advance heart failure clinic.   She established general cardiology care with me June 2019 and presents today for 36-monthfollow-up evaluation.  Carrie Watts a history of hypertension, type 2 diabetes mellitus, breast cancer and underwent mastectomy and 5 rounds of chemotherapy with cyclophosphamide, Adriamycin, and Taxol in [place]  In January 2018 she developed a saddle PE/DVT.  This felt that the PE may have been related to long flight travel versus high "hypercoagulable state secondary to breast cancer.  An MRI and MRA of her brain were negative.  An echo Doppler study with bubble study was stable and her carotid duplex evaluation did not show any acute abnormalities.  [facility]  In January 2018 EF was 55 to 60%.  Subsequently she continue to have dyspnea repeat CTA showed resolving clot burden.  Follow-up echo Doppler study showed EF of 40 to 45% with mild to moderate global reduction in systolic function, grade 1 diastolic dysfunction, mild LVH, mild MR, trace TR mildly elevated pulmonary pressure.  Previous right heart strain had resolved.  She had undergone cardiac catheterization in February 2018 which showed mild nonobstructive CAD and EF at that time was 40 to 45%.  EP was 11 mm.  Follow-up echo Doppler study in May 2018 showed an EF which had improved to 50 to 55%.  A CT scan did not show any residual PE.  Early May of this past [place] chest [place].  She apparently was evaluated in the emergency room ECG and troponins were normal.  She was felt most likely to  have atypical chest pain.  She last saw Dr. BHaroldine Lawson Sep 01, 2017.  He felt she was stable from a heart failure standpoint and suggested that she obtain a follow-up echo in 6 months and establish care cardiology.  She  was evaluated by LCammie Sickle FNP with symptoms of cough nasal congestion fatigue and sore throat.  There was questionable right hilar atelectasis versus infiltrate.  She was started on Augmentin 75/125 every 12 hours for 10 days.    When I initially saw her, she denied any chest pain or exertional symptoms.  She had noticed the rare isolated palpitations.  On exam she was well compensated and did not have any overt heart failure.  Her blood pressure was stable on carvedilol 6.25 mg twice a day and losartan 100 mg daily.  She is on long-term anticoagulation therapy.  She was on metformin for diabetes mellitus.  I suggested she may be a future candidate for Jardiance in light of her history of LV dysfunction.  Her lipids were treated with atorvastatin.  Since I saw her, she underwent a follow-up echo Doppler study on March 22, 2018.  LV function was now normal with an EF of 60 to 65%.  There was mild grade 1 diastolic relaxation impairment.  There was mild MR.  Presently, she feels well.  She denies any exertional chest pain symptoms.  She denies PND orthopnea.  She presents for reevaluation.  Past Medical History:  Diagnosis  Date  . Angina of effort ([place])   . Atrial fibrillation ([place])   . Cancer (HCC)    breast  . CHF (congestive heart failure) ([place])   . Deep venous thrombosis ([place])   . Diabetes mellitus without complication ([place])   . Hypercholesterolemia   . Hypertension   . Pulmonary embolism ([place])   . Renal disorder   . Stroke [facility])     Past Surgical History:  Procedure Laterality Date  . BREAST SURGERY    . COLONOSCOPY N/A 05/16/2016   Procedure: COLONOSCOPY;  Surgeon: [name], MD;  Location: [facility];  Service: Endoscopy;  Laterality: N/A;  .  ESOPHAGOGASTRODUODENOSCOPY N/A 05/16/2016   Procedure: ESOPHAGOGASTRODUODENOSCOPY (EGD);  Surgeon: [name], MD;  Location: [facility];  Service: Endoscopy;  Laterality: N/A;  . LEFT HEART CATH AND CORONARY ANGIOGRAPHY N/A 05/18/2016   Procedure: Left Heart Cath and Coronary Angiography;  Surgeon: Jettie Booze, MD;  Location: [facility];  Service: Cardiovascular;  Laterality: N/A;  . MASTECTOMY Left 2017  . PARTIAL HYSTERECTOMY      Current Medications: Outpatient Medications Prior to Visit  Medication Sig Dispense Refill  . acetaminophen (TYLENOL) 500 MG tablet Take 1 tablet (500 mg total) by mouth every 6 (six) hours as needed. 30 tablet 0  . anastrozole (ARIMIDEX) 1 MG tablet Take 1 tablet (1 mg total) by mouth daily. 90 tablet 4  . apixaban (ELIQUIS) 2.5 MG TABS tablet Take 1 tablet (2.5 mg total) by mouth 2 (two) times daily. 60 tablet 6  . atorvastatin (LIPITOR) 20 MG tablet Take 1 tablet (20 mg total) by mouth at bedtime. 90 tablet 3  . carvedilol (COREG) 6.25 MG tablet TAKE 1 TABLET BY MOUTH TWICE DAILY WITH A MEAL 180 tablet 3  . docusate sodium (COLACE) 100 MG capsule Take 100 mg by mouth daily as needed for mild constipation.    . feeding supplement, GLUCERNA SHAKE, (GLUCERNA SHAKE) LIQD Take 237 mLs by mouth 3 (three) times daily between meals. 30 Can 0  . folic acid (FOLVITE) 235 MCG tablet Take 800 mcg by mouth every morning.     [name] glucose monitoring kit (FREESTYLE) monitoring kit 1 each by Does not apply route 4 (four) times daily - after meals and at bedtime. 1 month Diabetic Testing Supplies for QAC-QHS accuchecks.  Diagnosis E11.65. Any brand OK 1 each 1  . losartan (COZAAR) 100 MG tablet TAKE 1 TABLET(100 MG) BY MOUTH DAILY 30 tablet 6  . Menthol, Topical Analgesic, (BIOFREEZE) 4 % GEL Apply 1 application topically 3 (three) times daily as needed. 1 Tube 5  . Multiple Vitamin (MULTIVITAMIN WITH MINERALS) TABS tablet Take 1 tablet by mouth every morning.      . ondansetron (ZOFRAN) 4 MG tablet Take 1 tablet (4 mg total) by mouth every 8 (eight) hours as needed for nausea or vomiting. 30 tablet 1  . TRUE METRIX BLOOD GLUCOSE TEST test strip USE AS DIRECTED FOUR TIMES DAILY AFTER MEALS AND AT BEDTIME 300 each 0  . TRUEPLUS LANCETS 28G MISC 1 each by Does not apply route QID. 300 each 3   No facility-administered medications prior to visit.      Allergies:   Dust mite extract   Social History   Socioeconomic History  . Marital status: Divorced    Spouse name: Not on file  . Number of children: Not on file  . Years of education: Not on file  . Highest education level: Not on  file  Occupational History  . Not on file  Social Needs  . Financial resource strain: Not on file  . Food insecurity:    Worry: Not on file    Inability: Not on file  . Transportation needs:    Medical: Not on file    Non-medical: Not on file  Tobacco Use  . Smoking status: Never Smoker  . Smokeless tobacco: Never Used  Substance and Sexual Activity  . Alcohol use: No  . Drug use: No  . Sexual activity: Not on file  Lifestyle  . Physical activity:    Days per week: Not on file    Minutes per session: Not on file  . Stress: Not on file  Relationships  . Social connections:    Talks on phone: Not on file    Gets together: Not on file    Attends religious service: Not on file    Active member of club or organization: Not on file    Attends meetings of clubs or organizations: Not on file    Relationship status: Not on file  Other Topics Concern  . Not on file  Social History Narrative  . Not on file     Family History:  The patient's family history includes Heart attack in her mother.   ROS General: Negative; No fevers, chills, or night sweats;  HEENT: Negative; No changes in vision or hearing, sinus congestion, difficulty swallowing Pulmonary recent cough/congestion, improved since initiating Augmentin Cardiovascular: Negative; No chest pain,  presyncope, syncope, palpitations GI: Negative; No nausea, vomiting, diarrhea, or abdominal pain GU: Negative; No dysuria, hematuria, or difficulty voiding Musculoskeletal: Negative; no myalgias, joint pain, or weakness Hematologic/Oncology: Positive for history of breast carcinoma, status post mastectomy and chemotherapy Endocrine: Negative; no heat/cold intolerance; no diabetes Neuro: Negative; no changes in balance, headaches Skin: Negative; No rashes or skin lesions Psychiatric: Negative; No behavioral problems, depression Sleep: Negative; No snoring, daytime sleepiness, hypersomnolence, bruxism, restless legs, hypnogognic hallucinations, no cataplexy Other comprehensive 14 point system review is negative.   PHYSICAL EXAM:   VS:  BP 130/70   Pulse 72   Ht '5\' 2"'$  (1.575 m)   Wt 197 lb 12.8 oz (89.7 kg)   BMI 36.18 kg/m     Repeat blood pressure by me was 124/70  Wt Readings from Last 3 Encounters:  03/28/18 197 lb 12.8 oz (89.7 kg)  03/02/18 202 lb (91.6 kg)  12/20/17 199 lb 9.6 oz (90.5 kg)    General: Alert, oriented, no distress.  Skin: normal turgor, no rashes, warm and dry HEENT: Normocephalic, atraumatic. Pupils equal round and reactive to light; sclera anicteric; extraocular muscles intact;  Nose without nasal septal hypertrophy Mouth/Parynx benign; Mallinpatti scale 3 Neck: No JVD, no carotid bruits; normal carotid upstroke Lungs: clear to ausculatation and percussion; no wheezing or rales Chest wall: without tenderness to palpitation Heart: PMI not displaced, RRR, s1 s2 normal, 1/6 systolic murmur, no diastolic murmur, no rubs, gallops, thrills, or heaves Abdomen: soft, nontender; no hepatosplenomehaly, BS+; abdominal aorta nontender and not dilated by palpation. Back: no CVA tenderness Pulses 2+ Musculoskeletal: full range of motion, normal strength, no joint deformities Extremities: no clubbing cyanosis or edema, Homan's sign negative  Neurologic: grossly  nonfocal; Cranial nerves grossly wnl Psychologic: Normal mood and affect   Studies/Labs Reviewed:   EKG:  EKG is ordered today.  Normal sinus rhythm at 72 bpm.  Left axis deviation.  Poor anterior R wave progression.  June 2019 ECG (independently read by  me): Normal sinus rhythm at 67 bpm with an isolated PVC; normal intervals.  No ST segment changes.  Recent Labs: BMP Latest Ref Rng & Units 12/20/2017 10/06/2017 08/30/2017  Glucose 70 - 99 mg/dL 87 136(H) 91  BUN 8 - 23 mg/dL '17 18 23  '$ Creatinine 0.44 - 1.00 mg/dL 1.16(H) 1.26(H) 1.19(H)  BUN/Creat Ratio 12 - 28 - 14 19  Sodium 135 - 145 mmol/L 140 142 144  Potassium 3.5 - 5.1 mmol/L 4.4 4.4 4.7  Chloride 98 - 111 mmol/L 108 104 107(H)  CO2 22 - 32 mmol/L '23 24 22  '$ Calcium 8.9 - 10.3 mg/dL 9.7 10.0 10.5(H)     Hepatic Function Latest Ref Rng & Units 12/20/2017 08/30/2017 12/20/2016  Total Protein 6.5 - 8.1 g/dL 7.0 7.5 6.9  Albumin 3.5 - 5.0 g/dL 3.6 4.6 3.5  AST 15 - 41 U/L '25 26 18  '$ ALT 0 - 44 U/L '20 26 18  '$ Alk Phosphatase 38 - 126 U/L 66 65 56  Total Bilirubin 0.3 - 1.2 mg/dL 0.4 0.5 0.52    CBC Latest Ref Rng & Units 12/20/2017 08/30/2017 12/20/2016  WBC 3.9 - 10.3 K/uL 5.2 6.5 5.2  Hemoglobin 11.6 - 15.9 g/dL 11.9 14.1 11.9  Hematocrit 34.8 - 46.6 % 36.0 42.3 35.7  Platelets 145 - 400 K/uL 186 238 226   Lab Results  Component Value Date   MCV 92.0 12/20/2017   MCV 92 08/30/2017   MCV 89.6 12/20/2016   Lab Results  Component Value Date   TSH 0.564 05/27/2016   Lab Results  Component Value Date   HGBA1C 5.6 03/02/2018     BNP No results found for: BNP  ProBNP No results found for: PROBNP   Lipid Panel     Component Value Date/Time   CHOL 165 06/03/2016 1119   TRIG 316 (H) 06/03/2016 1119   HDL 56 06/03/2016 1119   CHOLHDL 2.9 06/03/2016 1119   VLDL 63 (H) 06/03/2016 1119   LDLCALC 46 06/03/2016 1119     RADIOLOGY: No results found.   Additional studies/ records that were reviewed today include:   Reviewed the records from Dr. Jeffie Pollock and Cammie Sickle.  I reviewed her echo Doppler studies and cardiac catheterization.    ASSESSMENT:    1. Essential hypertension   2. Anticoagulation adequate   3. Grade I diastolic dysfunction   4. History of breast cancer   5. Moderate obesity   6. Hyperlipidemia with target LDL less than 70     PLAN:  Carrie Watts is a very pleasant 67 year old female who is originally from [place].  She moved to the [place] approximately 20 years ago and is now a [place] citizen.  She has a history of hypertension, type 2 diabetes mellitus, breast carcinoma status post left breast mastectomy and had undergone chemotherapy with cyclophosphamide, Adriamycin, and Taxol.  She also history of development of a saddle pulmonary embolism and DVT and since January 2018 has been on anticoagulation therapy.  She had developed mild LV dysfunction with a EF dropping to 40 to 45% by echo Doppler study in February 2018.  Ultimately, LV function had normalized on her echo in May 2018 which showed an EF of 55 to 60%; strain  pattern was normal..  Left ventricular dimensions were in the upper normal range at 51.3 at end diastole and 38.100 and systole.  There was probable very mild aortic sclerosis and trivial mitral regurgitation.  Retrospectlivel, it was felt that her initial systolic  dysfunction was related to massive PE rather than Adriamycin toxicity.  When she establish cardiology care with me in June 2019, she was well compensated.  Her blood pressure today continues to be stable on her regimen consisting of carvedilol 6.25 mg twice a day in addition to losartan 100 mg daily.  I reviewed her most recent echo Doppler study of March 22, 2018 which again corroborates normal systolic function with an EF of 60 to 65%.  There was mild grade 1 diastolic dysfunction and mild MR.  She continues to be on atorvastatin 20 mg daily.  Lipid studies earlier had shown an LDL cholesterol at 46  and total cholesterol 165.  She is tolerating anticoagulation without bleeding.  She continues to monitor her sugar.  I reviewed laboratory from [facility] from November 2019.  Creatinine was 1.07 blood sugar was 90.  She was not anemic with a hemoglobin of 12.6 and hematocrit of 36.7.  As long as she remains stable, I will see her in 1 year for cardiology reevaluation.   Medication Adjustments/Labs and Tests Ordered: Current medicines are reviewed at length with the patient today.  Concerns regarding medicines are outlined above.  Medication changes, Labs and Tests ordered today are listed in the Patient Instructions below. Patient Instructions  Medication Instructions:  Your physician recommends that you continue on your current medications as directed. Please refer to the Current Medication list given to you today.  If you need a refill on your cardiac medications before your next appointment, please call your pharmacy.   Follow-Up: At [facility], you and your health needs are our priority.  As part of our continuing mission to provide you with exceptional heart care, we have created designated Provider Care Teams.  These Care Teams include your primary Cardiologist (physician) and Advanced Practice Providers (APPs -  Physician Assistants and Nurse Practitioners) who all work together to provide you with the care you need, when you need it. You will need a follow up appointment in 12 months.  Please call our office 2 months in advance to schedule this appointment.  You may see Dr. Claiborne Billings or one of the following Advanced Practice Providers on your designated Care Team: [place], [place] . Fabian Sharp, PA-C        Signed, Shelva Majestic, MD  03/30/2018 3:29 PM    [facility] [address] [place], [place], [place]  [address] Phone: [phone]

## 2018-03-30 ENCOUNTER — Encounter: Payer: Self-pay | Admitting: Cardiovascular Disease

## 2018-04-06 ENCOUNTER — Other Ambulatory Visit (HOSPITAL_COMMUNITY): Payer: Self-pay | Admitting: Internal Medicine

## 2018-04-09 ENCOUNTER — Other Ambulatory Visit (HOSPITAL_COMMUNITY): Payer: Self-pay

## 2018-04-09 NOTE — Telephone Encounter (Signed)
Spoke with Carrie Watts that medications would need to be sent to Dr Claiborne Billings @ [facility] for further refills since she is no longer being seen by Dr Haroldine Laws as of May 2019.

## 2018-05-02 ENCOUNTER — Other Ambulatory Visit: Payer: Self-pay | Admitting: Family Medicine

## 2018-05-02 ENCOUNTER — Other Ambulatory Visit: Payer: Self-pay | Admitting: Oncology

## 2018-06-03 ENCOUNTER — Other Ambulatory Visit: Payer: Self-pay | Admitting: Family Medicine

## 2018-06-04 ENCOUNTER — Ambulatory Visit: Payer: Self-pay | Admitting: Family Medicine

## 2018-06-11 ENCOUNTER — Other Ambulatory Visit: Payer: Self-pay | Admitting: Family Medicine

## 2018-06-11 DIAGNOSIS — E119 Type 2 diabetes mellitus without complications: Secondary | ICD-10-CM

## 2018-07-12 ENCOUNTER — Other Ambulatory Visit: Payer: Self-pay | Admitting: Oncology

## 2018-07-12 DIAGNOSIS — Z1231 Encounter for screening mammogram for malignant neoplasm of breast: Secondary | ICD-10-CM

## 2018-07-12 DIAGNOSIS — C779 Secondary and unspecified malignant neoplasm of lymph node, unspecified: Principal | ICD-10-CM

## 2018-07-12 DIAGNOSIS — Z170 Estrogen receptor positive status [ER+]: Secondary | ICD-10-CM

## 2018-07-12 DIAGNOSIS — C50912 Malignant neoplasm of unspecified site of left female breast: Secondary | ICD-10-CM

## 2018-07-12 DIAGNOSIS — C50812 Malignant neoplasm of overlapping sites of left female breast: Secondary | ICD-10-CM

## 2018-07-31 ENCOUNTER — Telehealth: Payer: Self-pay | Admitting: Diagnostic Neuroimaging

## 2018-07-31 NOTE — Telephone Encounter (Signed)
07/31/18 - Left voicemail to schedule patient for virtual video visit with Dr. Leta Baptist. Patient's daughter called me back and stated patient is out of the country and unable to get back in due to COVID-19. Patient's daughter states patient will be back in September.

## 2018-08-10 ENCOUNTER — Other Ambulatory Visit (HOSPITAL_COMMUNITY): Payer: Self-pay | Admitting: Internal Medicine

## 2018-08-22 ENCOUNTER — Ambulatory Visit: Payer: Self-pay | Admitting: Diagnostic Neuroimaging

## 2018-08-22 ENCOUNTER — Encounter

## 2018-08-23 ENCOUNTER — Ambulatory Visit: Payer: Self-pay | Admitting: Family Medicine

## 2018-10-14 IMAGING — CT CT ANGIO CHEST
2 of 6 series · 18 of 36 positions shown · IV contrast (Omni 300)
Comparison: None.

CLINICAL DATA: Cough, chest pain, and shortness of breath for 2
weeks. History of breast cancer post left mastectomy. Chemotherapy.
Multiple recent strokes. Diabetes.

EXAM:
CT ANGIOGRAPHY CHEST WITH CONTRAST
TECHNIQUE: Multidetector CT imaging of the chest was performed using the
standard protocol during bolus administration of intravenous
contrast. Multiplanar CT image reconstructions and MIPs were
obtained to evaluate the vascular anatomy.
CONTRAST:  100 mL Isovue 370

[Series 7: pe thins · axial · 0.59mm/px · z∈[-910,-668]mm · 17 of 274 slices shown]
[im 16/274  lung]
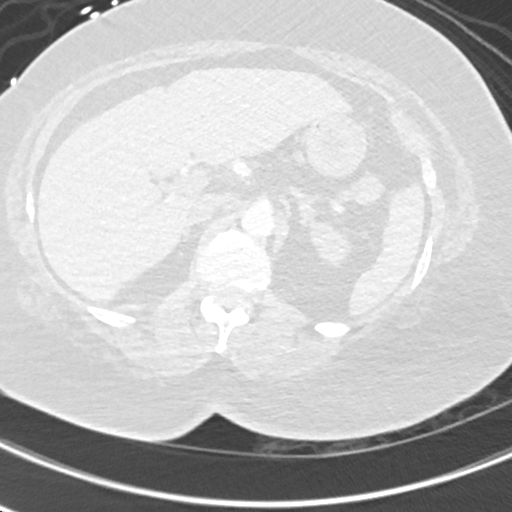
[im 31/274  mediastinal]
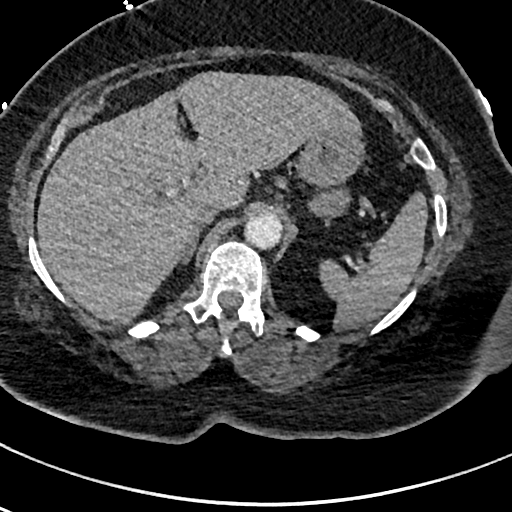
[im 46/274  lung]
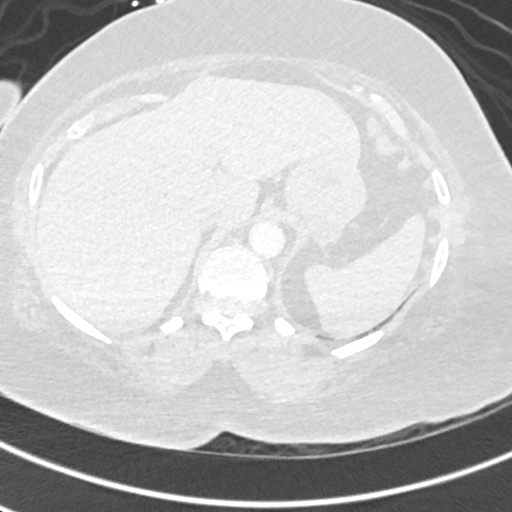
[im 61/274  mediastinal]
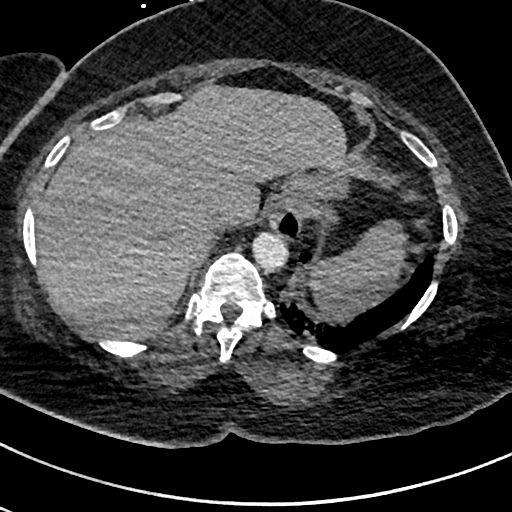
[im 76/274  lung]
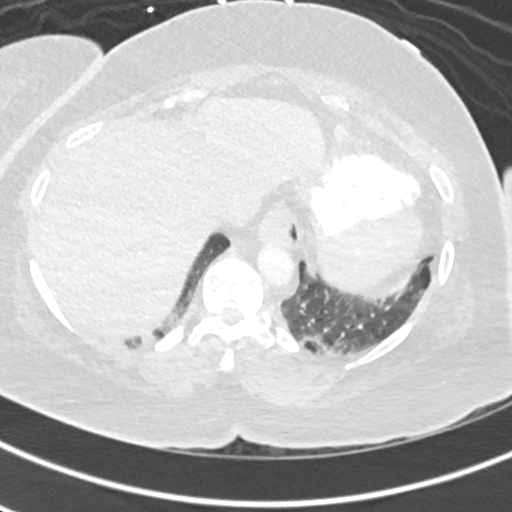
[im 92/274  mediastinal]
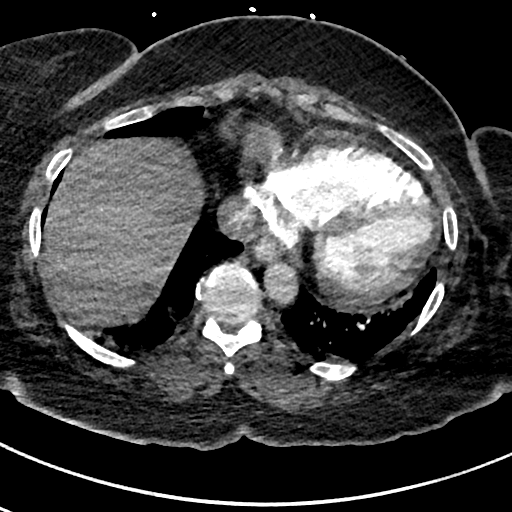
[im 107/274  lung]
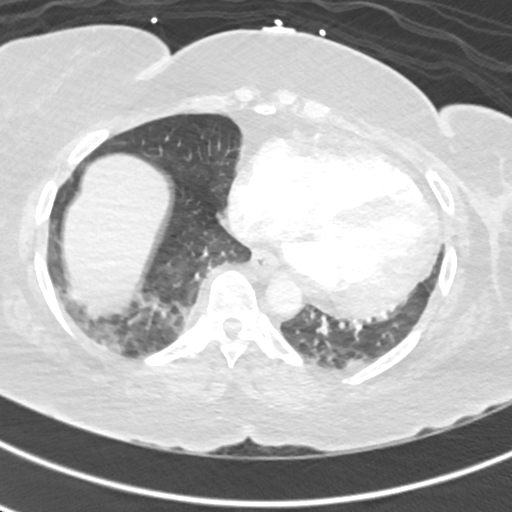
[im 122/274  mediastinal]
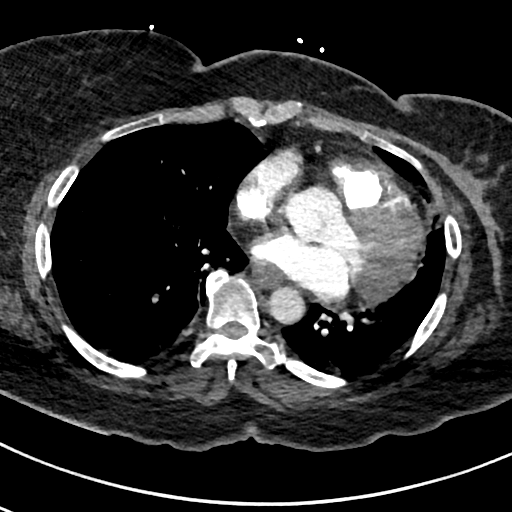
[im 137/274  lung]
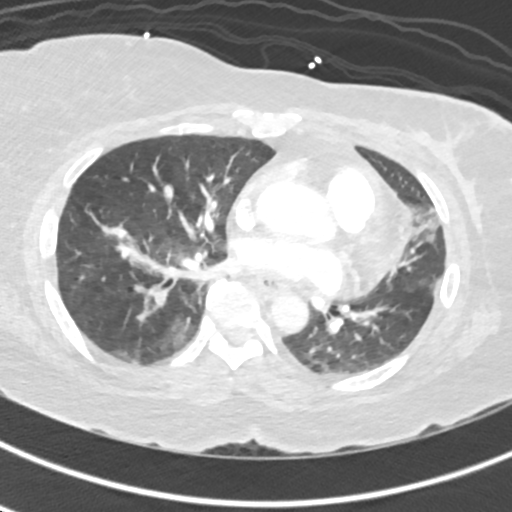
[im 152/274  mediastinal]
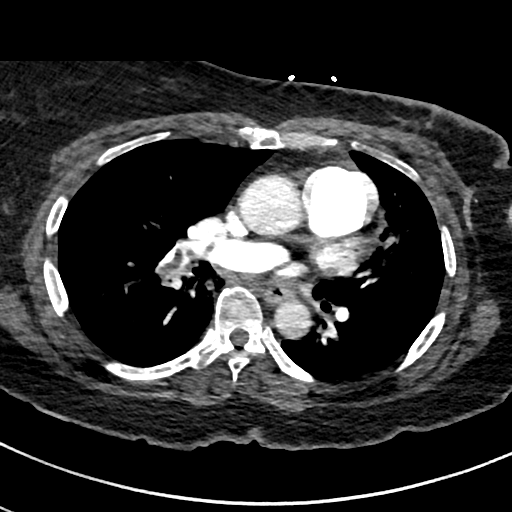
[im 167/274  lung]
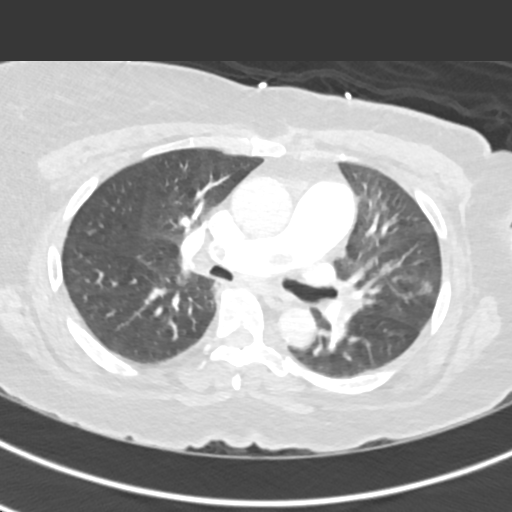
[im 183/274  mediastinal]
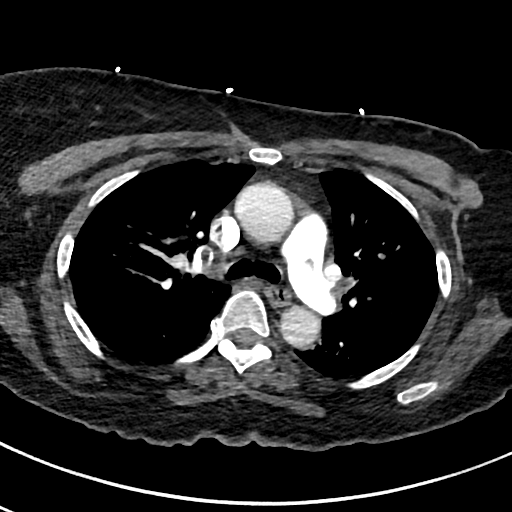
[im 198/274  lung]
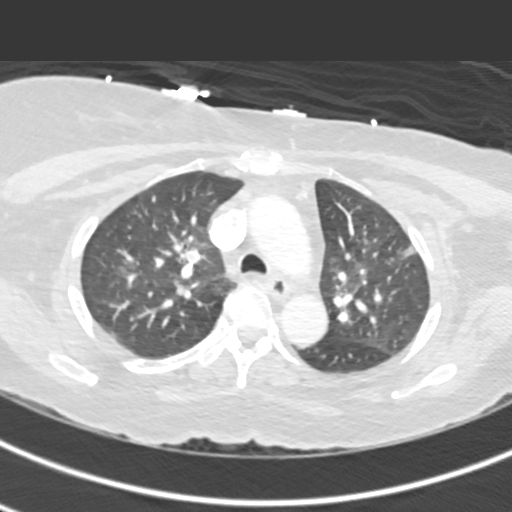
[im 213/274  mediastinal]
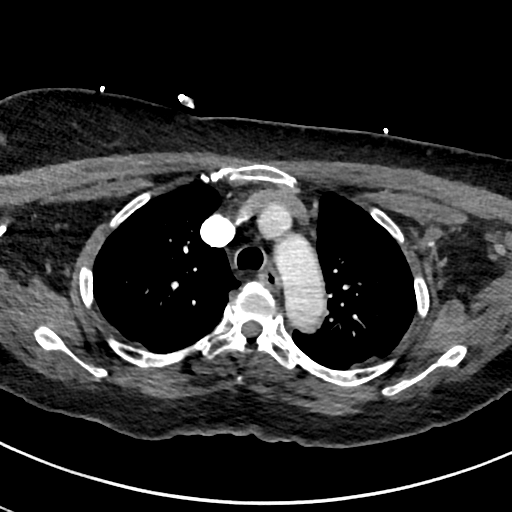
[im 228/274  lung]
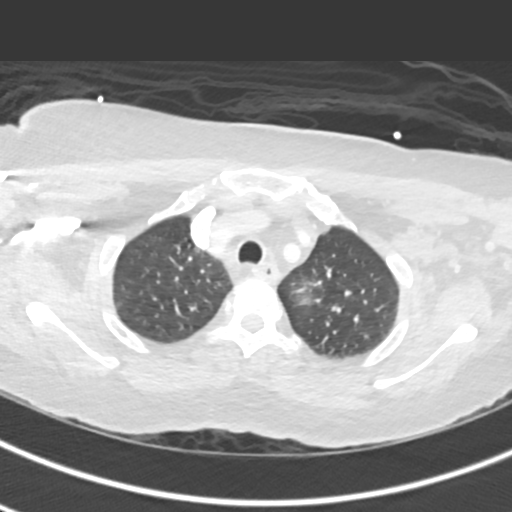
[im 243/274  mediastinal]
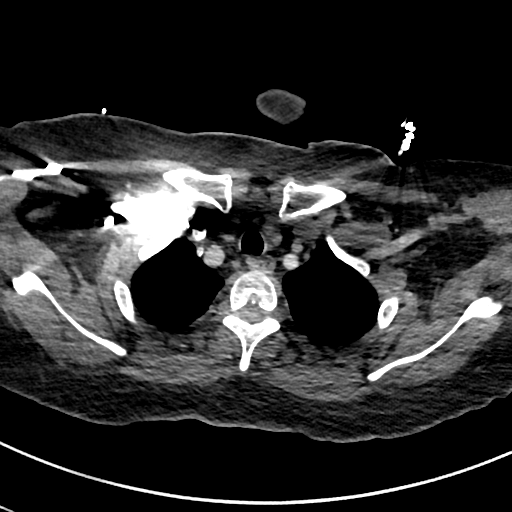
[im 258/274  lung]
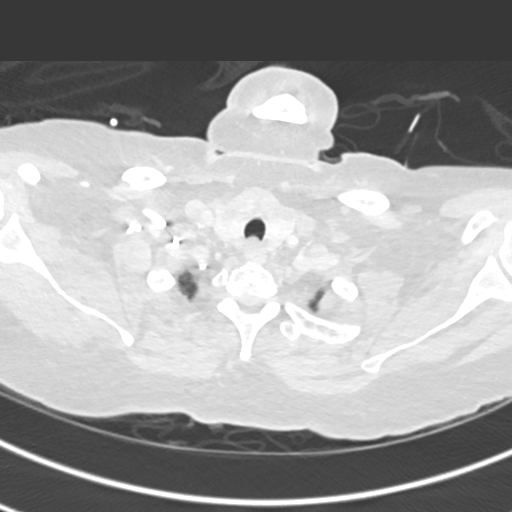

[Series 8: pe 2mm cor · coronal · 0.59mm/px · 1 of 108 slices shown]
[im 54/108  mediastinal]
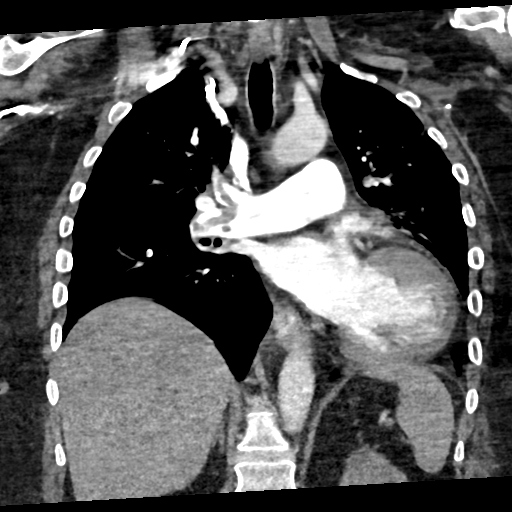

[18 of 36 positions shown; findings below may reference images not displayed]

FINDINGS: Cardiovascular: Filling defects demonstrated in the main and
bilateral lobar pulmonary artery is extending into bilateral upper
and lower lobe branches consistent with saddle embolus with
moderately large clot burden. The RV to LV ratio is elevated at
1.19, suggesting risk of right heart strain. No pericardial
effusion. Dilated ascending thoracic aorta at 3.5 cm. No dissection.
Scattered calcification.

Mediastinum/Nodes: Small esophageal hiatal hernia. Esophagus is
decompressed. No significant lymphadenopathy in the chest.

Lungs/Pleura: Evaluation is limited due to motion artifact. There
patchy nodular ground-glass infiltrative changes demonstrated
centrally in the lungs, in the lung bases, and scattered throughout
the upper lungs. This may be due to multifocal pneumonia, edema, or
infarcts. Metastatic disease would be a less likely consideration.
No pleural effusions. No pneumothorax.

Upper Abdomen: No acute abnormality.

Musculoskeletal: Postoperative changes consistent with left
mastectomy and surgical clips in the left axilla. No destructive
bone lesions.

Review of the MIP images confirms the above findings.
IMPRESSION: Positive examination for pulmonary embolus, including saddle embolus
and bilateral lobar and segmental pulmonary emboli. Moderately large
clot burden. Positive for acute PE with CT evidence of right heart
strain (RV/LV Ratio = 1.19) consistent with at least submassive
(intermediate risk) PE. The presence of right heart strain has been
associated with an increased risk of morbidity and mortality. Please
activate Code PE by paging [phone].

Patchy nodular ground-glass infiltrative changes in the lungs may be
due to multifocal pneumonia, edema, or infarcts. Metastatic disease
would be a less likely consideration but due to history of primary
carcinoma, non-contrast chest CT at 3-6 months is recommended. If
nodules persist, subsequent management will be based upon the most
suspicious nodule(s). This recommendation follows the consensus
statement: Guidelines for Management of Incidental Pulmonary Nodules
Detected on CT Images: From the [HOSPITAL] 8620; Radiology
8620; [DATE].

These results were called by telephone at the time of interpretation
on 04/24/2016 at [DATE] to Dr. ARDISA LOMPONG , who verbally acknowledged
these results.

## 2018-10-21 ENCOUNTER — Other Ambulatory Visit (HOSPITAL_COMMUNITY): Payer: Self-pay | Admitting: Internal Medicine

## 2018-12-08 ENCOUNTER — Other Ambulatory Visit: Payer: Self-pay | Admitting: Family Medicine

## 2018-12-08 DIAGNOSIS — E119 Type 2 diabetes mellitus without complications: Secondary | ICD-10-CM

## 2018-12-18 ENCOUNTER — Ambulatory Visit: Payer: Medicare Other | Admitting: Diagnostic Neuroimaging

## 2018-12-19 ENCOUNTER — Other Ambulatory Visit: Payer: Self-pay

## 2018-12-19 ENCOUNTER — Encounter (HOSPITAL_COMMUNITY): Payer: Self-pay | Admitting: *Deleted

## 2018-12-19 ENCOUNTER — Encounter (HOSPITAL_COMMUNITY): Payer: Self-pay

## 2018-12-19 ENCOUNTER — Ambulatory Visit: Payer: Self-pay

## 2018-12-20 ENCOUNTER — Other Ambulatory Visit (HOSPITAL_COMMUNITY): Payer: Self-pay

## 2018-12-20 MED ORDER — LOSARTAN POTASSIUM 100 MG PO TABS: ORAL_TABLET | ORAL | 6 refills | Status: DC

## 2018-12-21 ENCOUNTER — Ambulatory Visit: Payer: Self-pay | Admitting: Family Medicine

## 2018-12-24 ENCOUNTER — Inpatient Hospital Stay: Payer: Medicare Other | Admitting: Oncology

## 2018-12-24 ENCOUNTER — Inpatient Hospital Stay: Payer: Medicare Other | Attending: Oncology

## 2019-01-02 ENCOUNTER — Ambulatory Visit: Payer: Medicare Other | Admitting: Diagnostic Neuroimaging

## 2019-04-23 ENCOUNTER — Telehealth: Payer: Self-pay | Admitting: Oncology

## 2019-04-23 NOTE — Telephone Encounter (Signed)
Returned patient's phone call regarding rescheduling 09/14 appointment, per patient's request appointment has moved to 02/09.

## 2019-05-07 DIAGNOSIS — E1129 Type 2 diabetes mellitus with other diabetic kidney complication: Secondary | ICD-10-CM | POA: Diagnosis not present

## 2019-05-07 DIAGNOSIS — N182 Chronic kidney disease, stage 2 (mild): Secondary | ICD-10-CM | POA: Diagnosis not present

## 2019-05-08 DIAGNOSIS — N182 Chronic kidney disease, stage 2 (mild): Secondary | ICD-10-CM | POA: Diagnosis not present

## 2019-05-08 DIAGNOSIS — I82499 Acute embolism and thrombosis of other specified deep vein of unspecified lower extremity: Secondary | ICD-10-CM | POA: Diagnosis not present

## 2019-05-08 DIAGNOSIS — I2699 Other pulmonary embolism without acute cor pulmonale: Secondary | ICD-10-CM | POA: Diagnosis not present

## 2019-05-08 DIAGNOSIS — N179 Acute kidney failure, unspecified: Secondary | ICD-10-CM | POA: Diagnosis not present

## 2019-05-08 DIAGNOSIS — I639 Cerebral infarction, unspecified: Secondary | ICD-10-CM | POA: Diagnosis not present

## 2019-05-08 DIAGNOSIS — I129 Hypertensive chronic kidney disease with stage 1 through stage 4 chronic kidney disease, or unspecified chronic kidney disease: Secondary | ICD-10-CM | POA: Diagnosis not present

## 2019-05-08 DIAGNOSIS — E1122 Type 2 diabetes mellitus with diabetic chronic kidney disease: Secondary | ICD-10-CM | POA: Diagnosis not present

## 2019-05-08 DIAGNOSIS — C50919 Malignant neoplasm of unspecified site of unspecified female breast: Secondary | ICD-10-CM | POA: Diagnosis not present

## 2019-05-13 ENCOUNTER — Encounter: Payer: Self-pay | Admitting: Nurse Practitioner

## 2019-05-13 ENCOUNTER — Ambulatory Visit (INDEPENDENT_AMBULATORY_CARE_PROVIDER_SITE_OTHER): Payer: Medicare Other | Admitting: Nurse Practitioner

## 2019-05-13 ENCOUNTER — Other Ambulatory Visit: Payer: Self-pay

## 2019-05-13 VITALS — BP 145/78 | HR 61 | Temp 98.5°F | Resp 14 | Ht 61.0 in | Wt 204.0 lb

## 2019-05-13 DIAGNOSIS — I509 Heart failure, unspecified: Secondary | ICD-10-CM

## 2019-05-13 DIAGNOSIS — R110 Nausea: Secondary | ICD-10-CM

## 2019-05-13 DIAGNOSIS — Z0000 Encounter for general adult medical examination without abnormal findings: Secondary | ICD-10-CM

## 2019-05-13 DIAGNOSIS — R8290 Unspecified abnormal findings in urine: Secondary | ICD-10-CM

## 2019-05-13 DIAGNOSIS — Z6838 Body mass index (BMI) 38.0-38.9, adult: Secondary | ICD-10-CM

## 2019-05-13 DIAGNOSIS — I272 Pulmonary hypertension, unspecified: Secondary | ICD-10-CM

## 2019-05-13 DIAGNOSIS — E119 Type 2 diabetes mellitus without complications: Secondary | ICD-10-CM

## 2019-05-13 DIAGNOSIS — I10 Essential (primary) hypertension: Secondary | ICD-10-CM

## 2019-05-13 LAB — POCT URINALYSIS DIPSTICK
Bilirubin, UA: NEGATIVE
Glucose, UA: NEGATIVE
Ketones, UA: NEGATIVE
Nitrite, UA: NEGATIVE
Protein, UA: NEGATIVE
Spec Grav, UA: 1.015 (ref 1.010–1.025)
Urobilinogen, UA: 0.2 E.U./dL
pH, UA: 7 (ref 5.0–8.0)

## 2019-05-13 LAB — POCT GLYCOSYLATED HEMOGLOBIN (HGB A1C): Hemoglobin A1C: 5.8 % — AB (ref 4.0–5.6)

## 2019-05-13 MED ORDER — ONDANSETRON 8 MG PO TBDP: 8.0000 mg | ORAL_TABLET | Freq: Two times a day (BID) | ORAL | 0 refills | Status: AC

## 2019-05-13 NOTE — Progress Notes (Signed)
Established Patient Office Visit  Subjective:  Patient ID: Carrie Watts, female    DOB: [birth year]  Age: 69 y.o. MRN: [id number]  CC:  Chief Complaint  Patient presents with  . Diabetes  . Shortness of Breath  . Hand Pain    pain and hands locking up    HPI Carrie Watts presents for follow-up.  She is a previous patient of Lanae Boast FNP last visit was November 2019.  She has a history of angina, atrial fibrillation, heart failure, diabetes, hypertension, stroke in 2019, cancer, pulmonary embolism and DVT.  Her main concern is that she feels like she gets short of breath with walking distances greater than 50 feet.  She admits that she has to stop and rest. She has history of PE she is using the Eliquis. She denies any bleeding. She does have some dizziness that comes and goes.  She admits that the shortness of breath and dizziness seems to come when she is moving too fast.  She has occasional nausea no vomiting.  She denies chest pain.She was followed by cardiology; Dr. Claiborne Billings.  She has not been seen since December 2019. She has had several falls approximately 3 since her last visit.  She denies any injury.  She did fall in the yard with no one home.  She is in today without a cane or walker. She is having bilateral hand pain worse in the morning. She does admit that it gets better as she warms them up.  She denies swelling, numbness or tingling.        Past Medical History:  Diagnosis Date  . Angina of effort ([place])   . Atrial fibrillation ([place])   . Cancer (HCC)    breast  . CHF (congestive heart failure) ([place])   . Deep venous thrombosis ([place])   . Diabetes mellitus without complication ([place])   . Hypercholesterolemia   . Hypertension   . Pulmonary embolism ([place])   . Renal disorder   . Stroke [facility])     Past Surgical History:  Procedure Laterality Date  . BREAST SURGERY    . COLONOSCOPY N/A 05/16/2016   Procedure: COLONOSCOPY;  Surgeon: [name], MD;  Location: [facility];  Service: Endoscopy;  Laterality: N/A;  . ESOPHAGOGASTRODUODENOSCOPY N/A 05/16/2016   Procedure: ESOPHAGOGASTRODUODENOSCOPY (EGD);  Surgeon: [name], MD;  Location: [place];  Service: Endoscopy;  Laterality: N/A;  . LEFT HEART CATH AND CORONARY ANGIOGRAPHY N/A 05/18/2016   Procedure: Left Heart Cath and Coronary Angiography;  Surgeon: Jettie Booze, MD;  Location: [place] [facility];  Service: Cardiovascular;  Laterality: N/A;  . MASTECTOMY Left 2017  . PARTIAL HYSTERECTOMY      Family History  Problem Relation Age of Onset  . Heart attack Mother   . Clotting disorder Neg Hx     Social History   Socioeconomic History  . Marital status: Divorced    Spouse name: Not on file  . Number of children: Not on file  . Years of education: Not on file  . Highest education level: Not on file  Occupational History  . Not on file  Tobacco Use  . Smoking status: Never Smoker  . Smokeless tobacco: Never Used  Substance and Sexual Activity  . Alcohol use: No  . Drug use: No  . Sexual activity: Not on file  Other Topics Concern  . Not on file  Social History Narrative  . Not on file   Social Determinants of Health  Financial Resource Strain:   . Difficulty of Paying Living Expenses: Not on file  Food Insecurity:   . Worried About Charity fundraiser in the Last Year: Not on file  . Ran Out of Food in the Last Year: Not on file  Transportation Needs:   . Lack of Transportation (Medical): Not on file  . Lack of Transportation (Non-Medical): Not on file  Physical Activity:   . Days of Exercise per Week: Not on file  . Minutes of Exercise per Session: Not on file  Stress:   . Feeling of Stress : Not on file  Social Connections:   . Frequency of Communication with Friends and Family: Not on file  . Frequency of Social Gatherings with Friends and Family: Not on file  . Attends Religious Services: Not on file  . Active Member of Clubs or Organizations: Not on  file  . Attends Archivist Meetings: Not on file  . Marital Status: Not on file  Intimate Partner Violence:   . Fear of Current or Ex-Partner: Not on file  . Emotionally Abused: Not on file  . Physically Abused: Not on file  . Sexually Abused: Not on file    Outpatient Medications Prior to Visit  Medication Sig Dispense Refill  . acetaminophen (TYLENOL) 500 MG tablet Take 1 tablet (500 mg total) by mouth every 6 (six) hours as needed. 30 tablet 0  . anastrozole (ARIMIDEX) 1 MG tablet TAKE 1 TABLET(1 MG) BY MOUTH DAILY 90 tablet 4  . atorvastatin (LIPITOR) 20 MG tablet TAKE 1 TABLET(20 MG) BY MOUTH AT BEDTIME 90 tablet 3  . carvedilol (COREG) 6.25 MG tablet TAKE 1 TABLET(6.25 MG) BY MOUTH TWICE DAILY WITH A MEAL 180 tablet 3  . docusate sodium (COLACE) 100 MG capsule Take 100 mg by mouth daily as needed for mild constipation.    [name] ELIQUIS 2.5 MG TABS tablet TAKE 1 TABLET BY MOUTH TWICE DAILY 60 tablet 5  . folic acid (FOLVITE) 893 MCG tablet Take 800 mcg by mouth every morning.     [name] glucose monitoring kit (FREESTYLE) monitoring kit 1 each by Does not apply route 4 (four) times daily - after meals and at bedtime. 1 month Diabetic Testing Supplies for QAC-QHS accuchecks.  Diagnosis E11.65. Any brand OK 1 each 1  . losartan (COZAAR) 100 MG tablet TAKE 1 TABLET(100 MG) BY MOUTH DAILY 30 tablet 6  . Menthol, Topical Analgesic, (BIOFREEZE) 4 % GEL Apply 1 application topically 3 (three) times daily as needed. 1 Tube 5  . metFORMIN (GLUCOPHAGE) 500 MG tablet TAKE 1 TABLET(500 MG) BY MOUTH DAILY WITH BREAKFAST 90 tablet 1  . Multiple Vitamin (MULTIVITAMIN WITH MINERALS) TABS tablet Take 1 tablet by mouth every morning.     . ondansetron (ZOFRAN) 4 MG tablet Take 1 tablet (4 mg total) by mouth every 8 (eight) hours as needed for nausea or vomiting. 30 tablet 1  . TRUE METRIX BLOOD GLUCOSE TEST test strip USE AS DIRECTED FOUR TIMES DAILY AFTER MEALS AND AT BEDTIME 300 each 0  . TRUEPLUS  LANCETS 28G MISC USE TO TEST BLOOD SUGAR FOUR TIMES DAILY 300 each 3  . atorvastatin (LIPITOR) 20 MG tablet TAKE 1 TABLET BY MOUTH AT BEDTIME 90 tablet 3  . carvedilol (COREG) 6.25 MG tablet TAKE 1 TABLET BY MOUTH TWICE DAILY WITH A MEAL 180 tablet 3  . feeding supplement, GLUCERNA SHAKE, (GLUCERNA SHAKE) LIQD Take 237 mLs by mouth 3 (three) times daily between meals. (  Patient not taking: Reported on 05/13/2019) 30 Can 0  . losartan (COZAAR) 50 MG tablet TAKE 1 AND 1/2 TABLETS(75 MG) BY MOUTH DAILY (Patient not taking: Reported on 05/13/2019) 135 tablet 1   No facility-administered medications prior to visit.    Allergies  Allergen Reactions  . Dust Mite Extract     ROS Review of Systems  Constitutional: Negative.   HENT: Negative.   Eyes: Negative.   Respiratory: Positive for shortness of breath.   Cardiovascular: Negative.   Gastrointestinal: Negative.   Endocrine: Negative.   Genitourinary: Negative.   Musculoskeletal: Positive for arthralgias.       Hand pain  Skin: Negative.   Allergic/Immunologic: Negative.   Neurological: Positive for dizziness.       Happening often  Hematological: Negative.   Psychiatric/Behavioral: Negative.       Objective:    Physical Exam  Constitutional: She is oriented to person, place, and time. She appears well-developed and well-nourished.  HENT:  Head: Normocephalic.  Cardiovascular: Normal rate.  Murmur heard. Pulmonary/Chest: Effort normal and breath sounds normal.  Abdominal: Soft. Bowel sounds are normal.  Musculoskeletal:     Cervical back: Normal range of motion and neck supple.     Comments: Adequate range of motion Strength 4/5  Neurological: She is alert and oriented to person, place, and time.  Slow speech with the accident  Skin: Skin is warm and dry.  Psychiatric: She has a normal mood and affect. Her behavior is normal. Judgment and thought content normal.    BP (!) 145/78 (BP Location: Right Arm, Patient Position:  Sitting, Cuff Size: Large)   Pulse 61   Temp 98.5 F (36.9 C) (Oral)   Resp 14   Ht '5\' 1"'$  (1.549 m)   Wt 204 lb (92.5 kg)   SpO2 100%   BMI 38.55 kg/m  Wt Readings from Last 3 Encounters:  05/13/19 204 lb (92.5 kg)  03/28/18 197 lb 12.8 oz (89.7 kg)  03/02/18 202 lb (91.6 kg)     Health Maintenance Due  Topic Date Due  . OPHTHALMOLOGY EXAM  04/11/2018  . FOOT EXAM  03/03/2019    There are no preventive care reminders to display for this patient.  Lab Results  Component Value Date   TSH 0.564 05/27/2016   Lab Results  Component Value Date   WBC 5.2 12/20/2017   HGB 11.9 12/20/2017   HCT 36.0 12/20/2017   MCV 92.0 12/20/2017   PLT 186 12/20/2017   Lab Results  Component Value Date   NA 140 12/20/2017   K 4.4 12/20/2017   CHLORIDE 109 12/20/2016   CO2 23 12/20/2017   GLUCOSE 87 12/20/2017   BUN 17 12/20/2017   CREATININE 1.16 (H) 12/20/2017   BILITOT 0.4 12/20/2017   ALKPHOS 66 12/20/2017   AST 25 12/20/2017   ALT 20 12/20/2017   PROT 7.0 12/20/2017   ALBUMIN 3.6 12/20/2017   CALCIUM 9.7 12/20/2017   ANIONGAP 9 12/20/2017   EGFR 57 (L) 12/20/2016   Lab Results  Component Value Date   CHOL 165 06/03/2016   Lab Results  Component Value Date   HDL 56 06/03/2016   Lab Results  Component Value Date   LDLCALC 46 06/03/2016   Lab Results  Component Value Date   TRIG 316 (H) 06/03/2016   Lab Results  Component Value Date   CHOLHDL 2.9 06/03/2016   Lab Results  Component Value Date   HGBA1C 5.8 (A) 05/13/2019  Assessment & Plan:   Problem List Items Addressed This Visit      Unprioritized   CHF (congestive heart failure) ([place]) (Chronic)   Relevant Orders   Ambulatory referral to Cardiology   Essential hypertension   Relevant Orders   Ambulatory referral to Cardiology   Pulmonary hypertension ([place]) (Chronic)   Relevant Orders   Ambulatory referral to Cardiology    Other Visit Diagnoses    Type 2 diabetes mellitus without  complication, without long-term current use of insulin ([place])    -  Primary   Continue with current regimen   Relevant Orders   HgB A1c (Completed)   Urinalysis Dipstick (Completed)   Comp. Metabolic Panel (12)   TSH   Orthostatic vital signs (Completed)   Healthcare maintenance       Relevant Orders   Lipid panel   Body mass index (BMI) of 38.0-38.9 in adult       Abnormal urinalysis       Culture pending   Relevant Orders   Urine Culture   Nausea       Relevant Medications   ondansetron (ZOFRAN-ODT) 8 MG disintegrating tablet      Meds ordered this encounter  Medications  . ondansetron (ZOFRAN-ODT) 8 MG disintegrating tablet    Sig: Take 1 tablet (8 mg total) by mouth 2 (two) times daily.    Dispense:  60 tablet    Refill:  0    Do not add to the electronic "Automatic Refill" notification system. Patient may have prescription filled one day early if pharmacy is closed on scheduled refill date.    Order Specific Question:   Supervising Provider    Answer:   Tresa Garter [[id number]]    Follow-up: Return in 3 months (on 08/10/2019).    Vevelyn Francois, NP

## 2019-05-13 NOTE — Patient Instructions (Signed)
Arthritis Arthritis means joint pain. It can also mean joint disease. A joint is a place where bones come together. There are more than 100 types of arthritis. What are the causes? This condition may be caused by:  Wear and tear of a joint. This is the most common cause.  A lot of acid in the blood, which leads to pain in the joint (gout).  Pain and swelling (inflammation) in a joint.  Infection of a joint.  Injuries in the joint.  A reaction to medicines (allergy). In some cases, the cause may not be known. What are the signs or symptoms? Symptoms of this condition include:  Redness at a joint.  Swelling at a joint.  Stiffness at a joint.  Warmth coming from the joint.  A fever.  A feeling of being sick. How is this treated? This condition may be treated with:  Treating the cause, if it is known.  Rest.  Raising (elevating) the joint.  Putting cold or hot packs on the joint.  Medicines to treat symptoms and reduce pain and swelling.  Shots of medicines (cortisone) into the joint. You may also be told to make changes in your life, such as doing exercises and losing weight. Follow these instructions at home: Medicines  Take over-the-counter and prescription medicines only as told by your doctor.  Do not take aspirin for pain if your doctor says that you may have gout. Activity  Rest your joint if your doctor tells you to.  Avoid activities that make the pain worse.  Exercise your joint regularly as told by your doctor. Try doing exercises like: ? Swimming. ? Water aerobics. ? Biking. ? Walking. Managing pain, stiffness, and swelling      If told, put ice on the affected area. ? Put ice in a plastic bag. ? Place a towel between your skin and the bag. ? Leave the ice on for 20 minutes, 2-3 times per day.  If your joint is swollen, raise (elevate) it above the level of your heart if told by your doctor.  If your joint feels stiff in the morning,  try taking a warm shower.  If told, put heat on the affected area. Do this as often as told by your doctor. Use the heat source that your doctor recommends, such as a moist heat pack or a heating pad. If you have diabetes, do not apply heat without asking your doctor. To apply heat: ? Place a towel between your skin and the heat source. ? Leave the heat on for 20-30 minutes. ? Remove the heat if your skin turns bright red. This is very important if you are unable to feel pain, heat, or cold. You may have a greater risk of getting burned. General instructions  Do not use any products that contain nicotine or tobacco, such as cigarettes, e-cigarettes, and chewing tobacco. If you need help quitting, ask your doctor.  Keep all follow-up visits as told by your doctor. This is important. Contact a doctor if:  The pain gets worse.  You have a fever. Get help right away if:  You have very bad pain in your joint.  You have swelling in your joint.  Your joint is red.  Many joints become painful and swollen.  You have very bad back pain.  Your leg is very weak.  You cannot control your pee (urine) or poop (stool). Summary  Arthritis means joint pain. It can also mean joint disease. A joint is a place   where bones come together.  The most common cause of this condition is wear and tear of a joint.  Symptoms of this condition include redness, swelling, or stiffness of the joint.  This condition is treated with rest, raising the joint, medicines, and putting cold or hot packs on the joint.  Follow your doctor's instructions about medicines, activity, exercises, and other home care treatments. This information is not intended to replace advice given to you by your health care provider. Make sure you discuss any questions you have with your health care provider. Document Revised: 03/05/2018 Document Reviewed: 03/05/2018 Elsevier Patient Education  [place]. Vertigo Vertigo is  the feeling that you or the things around you are moving when they are not. This feeling can come and go at any time. Vertigo often goes away on its own. This condition can be dangerous if it happens when you are doing activities like driving or working with machines. Your doctor will do tests to find the cause of your vertigo. These tests will also help your doctor decide on the best treatment for you. Follow these instructions at home: Eating and drinking      Drink enough fluid to keep your pee (urine) pale yellow.  Do not drink alcohol. Activity  Return to your normal activities as told by your doctor. Ask your doctor what activities are safe for you.  In the morning, first sit up on the side of the bed. When you feel okay, stand slowly while you hold onto something until you know that your balance is fine.  Move slowly. Avoid sudden body or head movements or certain positions, as told by your doctor.  Use a cane if you have trouble standing or walking.  Sit down right away if you feel dizzy.  Avoid doing any tasks or activities that can cause danger to you or others if you get dizzy.  Avoid bending down if you feel dizzy. Place items in your home so that they are easy for you to reach without leaning over.  Do not drive or use heavy machinery if you feel dizzy. General instructions  Take over-the-counter and prescription medicines only as told by your doctor.  Keep all follow-up visits as told by your doctor. This is important. Contact a doctor if:  Your medicine does not help your vertigo.  You have a fever.  Your problems get worse or you have new symptoms.  Your family or friends see changes in your behavior.  The feeling of being sick to your stomach gets worse.  Your vomiting gets worse.  You lose feeling (have numbness) in part of your body.  You feel prickling and tingling in a part of your body. Get help right away if:  You have trouble moving or  talking.  You are always dizzy.  You pass out (faint).  You get very bad headaches.  You feel weak in your hands, arms, or legs.  You have changes in your hearing.  You have changes in how you see (vision).  You get a stiff neck.  Bright light starts to bother you. Summary  Vertigo is the feeling that you or the things around you are moving when they are not.  Your doctor will do tests to find the cause of your vertigo.  You may be told to avoid some tasks, positions, or movements.  Contact a doctor if your medicine is not helping, or if you have a fever, new symptoms, or a change in behavior.  Get help right away if you get very bad headaches, or if you have changes in how you speak, hear, or see. This information is not intended to replace advice given to you by your health care provider. Make sure you discuss any questions you have with your health care provider. Document Revised: 02/19/2018 Document Reviewed: 02/19/2018 Elsevier Patient Education  2020 [employer].

## 2019-05-15 LAB — COMP. METABOLIC PANEL (12)
AST: 25 IU/L (ref 0–40)
Albumin/Globulin Ratio: 1.5 (ref 1.2–2.2)
Albumin: 4.3 g/dL (ref 3.8–4.8)
Alkaline Phosphatase: 80 IU/L (ref 39–117)
BUN/Creatinine Ratio: 19 (ref 12–28)
BUN: 24 mg/dL (ref 8–27)
Bilirubin Total: 0.3 mg/dL (ref 0.0–1.2)
Calcium: 10.3 mg/dL (ref 8.7–10.3)
Chloride: 105 mmol/L (ref 96–106)
Creatinine, Ser: 1.26 mg/dL — ABNORMAL HIGH (ref 0.57–1.00)
GFR calc Af Amer: 50 mL/min/{1.73_m2} — ABNORMAL LOW (ref >59)
GFR calc non Af Amer: 43 mL/min/{1.73_m2} — ABNORMAL LOW (ref >59)
Globulin, Total: 2.8 g/dL (ref 1.5–4.5)
Glucose: 77 mg/dL (ref 65–99)
Potassium: 5 mmol/L (ref 3.5–5.2)
Sodium: 140 mmol/L (ref 134–144)
Total Protein: 7.1 g/dL (ref 6.0–8.5)

## 2019-05-15 LAB — URINE CULTURE

## 2019-05-15 LAB — TSH: TSH: 0.681 u[IU]/mL (ref 0.450–4.500)

## 2019-05-17 ENCOUNTER — Ambulatory Visit (INDEPENDENT_AMBULATORY_CARE_PROVIDER_SITE_OTHER): Payer: Medicare Other | Admitting: Cardiovascular Disease

## 2019-05-17 ENCOUNTER — Encounter: Payer: Self-pay | Admitting: Cardiovascular Disease

## 2019-05-17 ENCOUNTER — Other Ambulatory Visit: Payer: Self-pay

## 2019-05-17 DIAGNOSIS — M7989 Other specified soft tissue disorders: Secondary | ICD-10-CM | POA: Diagnosis not present

## 2019-05-17 DIAGNOSIS — Z7901 Long term (current) use of anticoagulants: Secondary | ICD-10-CM | POA: Diagnosis not present

## 2019-05-17 DIAGNOSIS — E782 Mixed hyperlipidemia: Secondary | ICD-10-CM

## 2019-05-17 DIAGNOSIS — I10 Essential (primary) hypertension: Secondary | ICD-10-CM

## 2019-05-17 DIAGNOSIS — I2692 Saddle embolus of pulmonary artery without acute cor pulmonale: Secondary | ICD-10-CM

## 2019-05-17 DIAGNOSIS — I509 Heart failure, unspecified: Secondary | ICD-10-CM

## 2019-05-17 MED ORDER — HYDROCHLOROTHIAZIDE 12.5 MG PO CAPS: 12.5000 mg | ORAL_CAPSULE | Freq: Every day | ORAL | 3 refills | Status: DC

## 2019-05-17 NOTE — Patient Instructions (Addendum)
Medication Instructions:  BEGIN TAKING HYDROCHLOROTHIAZIDE 12.5 MG (1 CAPSULE) EVERY OTHER DAY.   IF YOU ARE STILL HAVING SWELLING, YOU MAY TAKE 1 CAPSULE (12.5MG ) DAILY *If you need a refill on your cardiac medications before your next appointment, please call your pharmacy*  Lab Work: FASTING LIPIDS If you have labs (blood work) drawn today and your tests are completely normal, you will receive your results only by: [name] MyChart Message (if you have MyChart) OR . A paper copy in the mail If you have any lab test that is abnormal or we need to change your treatment, we will call you to review the results.  Follow-Up: At [facility], you and your health needs are our priority.  As part of our continuing mission to provide you with exceptional heart care, we have created designated Provider Care Teams.  These Care Teams include your primary Cardiologist (physician) and Advanced Practice Providers (APPs -  Physician Assistants and Nurse Practitioners) who all work together to provide you with the care you need, when you need it.  Your next appointment:   12 month(s)  The format for your next appointment:   In Person  Provider:   Shelva Majestic, MD   Currently, there is a hotline to call (active 04/19/19) to schedule vaccination appointments as no walk-ins will be accepted.   Number: [phone].     If an appointment is not available please go to [email or web address] to sign up for notification when additional vaccine appointments are available.     If you have further questions or concerns about the vaccine process, please visit [email or web address] or contact your primary care physician.

## 2019-05-17 NOTE — Progress Notes (Signed)
Cardiology Office Note    Date:  05/19/2019   ID:  Carrie Watts, DOB 1950-09-04, MRN [id number]  PCP:  Carrie Boast, FNP  Cardiologist:  Carrie Majestic, MD , Carrie Watts (heart failure)    History of Present Illness:  Carrie Watts is a 69 y.o. female who is originally from [place] who has been followed by Carrie Watts in advance heart failure clinic.   She established general cardiology care with me June 2019 and last saw her in December 2019.  She presents today for a 14 month follow-up evaluation.  Carrie Watts has a history of hypertension, type 2 diabetes mellitus, breast cancer and underwent mastectomy and 5 rounds of chemotherapy with cyclophosphamide, Adriamycin, and Taxol in [place].  In January 2018 she developed a saddle PE/DVT.  This felt that the PE may have been related to long flight travel versus high "hypercoagulable state secondary to breast cancer.  An MRI and MRA of her brain were negative.  An echo Doppler study with bubble study was stable and her carotid duplex evaluation did not show any acute abnormalities.  She was started on Lovenox, transition to [facility] at discharge.  In January 2018 EF was 55 to 60%.  Subsequently she continue to have dyspnea repeat CTA showed resolving clot burden.  Follow-up echo Doppler study showed EF of 40 to 45% with mild to moderate global reduction in systolic function, grade 1 diastolic dysfunction, mild LVH, mild MR, trace TR mildly elevated pulmonary pressure.  Previous right heart strain had resolved.  She had undergone cardiac catheterization in February 2018 which showed mild nonobstructive CAD and EF at that time was 40 to 45%.  EP was 11 mm.  Follow-up echo Doppler study in May 2018 showed an EF which had improved to 50 to 55%.  A CT scan did not show any residual PE.  Early May of this past year, while in [place] she had had 2 episodes of atypical chest pain.  She apparently was evaluated in the emergency room ECG and troponins were  normal.  She was felt most likely to have atypical chest pain.  She last saw Carrie Watts on Sep 01, 2017.  He felt she was stable from a heart failure standpoint and suggested that she obtain a follow-up echo in 6 months and establish care cardiology.  She  was evaluated by Carrie Sickle, FNP with symptoms of cough nasal congestion fatigue and sore throat.  There was questionable right hilar atelectasis versus infiltrate.  She was started on Augmentin 75/125 every 12 hours for 10 days.    When I initially saw her, she denied any chest pain or exertional symptoms.  She had noticed the rare isolated palpitations.  On exam she was well compensated and did not have any overt heart failure.  Her blood pressure was stable on carvedilol 6.25 mg twice a day and losartan 100 mg daily.  She is on long-term anticoagulation therapy.  She was on metformin for diabetes mellitus.  I suggested she may be a future candidate for Jardiance in light of her history of LV dysfunction.  Her lipids were treated with atorvastatin.  She underwent a follow-up echo Doppler study on March 22, 2018.  LV function was now normal with an EF of 60 to 65%.  There was mild grade 1 diastolic relaxation impairment.  There was mild MR. When last seen, she felt well, denied exertional chest pain symptoms, PND or orthopnea presently.  Since I last saw her, she admits  to occasional episodes of sharp atypical nonexertional chest pain which lasts seconds.  She admits to swelling in both her feet almost on a daily basis.  She has been trying to reduce her sodium intake.  She is unaware of any palpitations.  She denies PND orthopnea.  She has continued to be on Eliquis anticoagulation and is on losartan 100 mg daily in addition to carvedilol 6.25 mg twice a day for blood pressure control.  She is diabetic on metformin.  She has been on atorvastatin for hyperlipidemia.  She presents for evaluation.  Past Medical History:  Diagnosis Date  . Angina  of effort ([place])   . Atrial fibrillation ([place])   . Cancer (HCC)    breast  . CHF (congestive heart failure) ([place])   . Deep venous thrombosis ([place])   . Diabetes mellitus without complication ([place])   . Hypercholesterolemia   . Hypertension   . Pulmonary embolism ([place])   . Renal disorder   . Stroke [facility])     Past Surgical History:  Procedure Laterality Date  . BREAST SURGERY    . COLONOSCOPY N/A 05/16/2016   Procedure: COLONOSCOPY;  Surgeon: [name], MD;  Location: [place];  Service: Endoscopy;  Laterality: N/A;  . ESOPHAGOGASTRODUODENOSCOPY N/A 05/16/2016   Procedure: ESOPHAGOGASTRODUODENOSCOPY (EGD);  Surgeon: [name], MD;  Location: [place];  Service: Endoscopy;  Laterality: N/A;  . LEFT HEART CATH AND CORONARY ANGIOGRAPHY N/A 05/18/2016   Procedure: Left Heart Cath and Coronary Angiography;  Surgeon: Jettie Booze, MD;  Location: [place] [facility];  Service: Cardiovascular;  Laterality: N/A;  . MASTECTOMY Left 2017  . PARTIAL HYSTERECTOMY      Current Medications: Outpatient Medications Prior to Visit  Medication Sig Dispense Refill  . acetaminophen (TYLENOL) 500 MG tablet Take 1 tablet (500 mg total) by mouth every 6 (six) hours as needed. 30 tablet 0  . anastrozole (ARIMIDEX) 1 MG tablet TAKE 1 TABLET(1 MG) BY MOUTH DAILY 90 tablet 4  . atorvastatin (LIPITOR) 20 MG tablet TAKE 1 TABLET(20 MG) BY MOUTH AT BEDTIME 90 tablet 3  . carvedilol (COREG) 6.25 MG tablet TAKE 1 TABLET(6.25 MG) BY MOUTH TWICE DAILY WITH A MEAL 180 tablet 3  . docusate sodium (COLACE) 100 MG capsule Take 100 mg by mouth daily as needed for mild constipation.    [name] ELIQUIS 2.5 MG TABS tablet TAKE 1 TABLET BY MOUTH TWICE DAILY 60 tablet 5  . folic acid (FOLVITE) 546 MCG tablet Take 800 mcg by mouth every morning.     [name] glucose monitoring kit (FREESTYLE) monitoring kit 1 each by Does not apply route 4 (four) times daily - after meals and at bedtime. 1 month Diabetic Testing Supplies  for QAC-QHS accuchecks.  Diagnosis E11.65. Any brand OK 1 each 1  . losartan (COZAAR) 100 MG tablet TAKE 1 TABLET(100 MG) BY MOUTH DAILY 30 tablet 6  . Menthol, Topical Analgesic, (BIOFREEZE) 4 % GEL Apply 1 application topically 3 (three) times daily as needed. 1 Tube 5  . metFORMIN (GLUCOPHAGE) 500 MG tablet TAKE 1 TABLET(500 MG) BY MOUTH DAILY WITH BREAKFAST 90 tablet 1  . Multiple Vitamin (MULTIVITAMIN WITH MINERALS) TABS tablet Take 1 tablet by mouth every morning.     . ondansetron (ZOFRAN) 4 MG tablet Take 1 tablet (4 mg total) by mouth every 8 (eight) hours as needed for nausea or vomiting. 30 tablet 1  . ondansetron (ZOFRAN-ODT) 8 MG disintegrating tablet Take 1 tablet (8 mg total)  by mouth 2 (two) times daily. 60 tablet 0  . TRUE METRIX BLOOD GLUCOSE TEST test strip USE AS DIRECTED FOUR TIMES DAILY AFTER MEALS AND AT BEDTIME 300 each 0  . TRUEPLUS LANCETS 28G MISC USE TO TEST BLOOD SUGAR FOUR TIMES DAILY 300 each 3  . feeding supplement, GLUCERNA SHAKE, (GLUCERNA SHAKE) LIQD Take 237 mLs by mouth 3 (three) times daily between meals. (Patient not taking: Reported on 05/13/2019) 30 Can 0  . losartan (COZAAR) 50 MG tablet TAKE 1 AND 1/2 TABLETS(75 MG) BY MOUTH DAILY (Patient not taking: Reported on 05/13/2019) 135 tablet 1   No facility-administered medications prior to visit.     Allergies:   Dust mite extract   Social History   Socioeconomic History  . Marital status: Divorced    Spouse name: Not on file  . Number of children: Not on file  . Years of education: Not on file  . Highest education level: Not on file  Occupational History  . Not on file  Tobacco Use  . Smoking status: Never Smoker  . Smokeless tobacco: Never Used  Substance and Sexual Activity  . Alcohol use: No  . Drug use: No  . Sexual activity: Not on file  Other Topics Concern  . Not on file  Social History Narrative  . Not on file   Social Determinants of Health   Financial Resource Strain:   .  Difficulty of Paying Living Expenses: Not on file  Food Insecurity:   . Worried About Charity fundraiser in the Last Year: Not on file  . Ran Out of Food in the Last Year: Not on file  Transportation Needs:   . Lack of Transportation (Medical): Not on file  . Lack of Transportation (Non-Medical): Not on file  Physical Activity:   . Days of Exercise per Week: Not on file  . Minutes of Exercise per Session: Not on file  Stress:   . Feeling of Stress : Not on file  Social Connections:   . Frequency of Communication with Friends and Family: Not on file  . Frequency of Social Gatherings with Friends and Family: Not on file  . Attends Religious Services: Not on file  . Active Member of Clubs or Organizations: Not on file  . Attends Archivist Meetings: Not on file  . Marital Status: Not on file     Family History:  The patient's family history includes Heart attack in her mother.   ROS General: Negative; No fevers, chills, or night sweats;  HEENT: Negative; No changes in vision or hearing, sinus congestion, difficulty swallowing Pulmonary recent cough/congestion, improved since initiating Augmentin Cardiovascular: Negative; No chest pain, presyncope, syncope, palpitations GI: Negative; No nausea, vomiting, diarrhea, or abdominal pain GU: Negative; No dysuria, hematuria, or difficulty voiding Musculoskeletal: Negative; no myalgias, joint pain, or weakness Hematologic/Oncology: Positive for history of breast carcinoma, status post mastectomy and chemotherapy Endocrine: Negative; no heat/cold intolerance; no diabetes Neuro: Negative; no changes in balance, headaches Skin: Negative; No rashes or skin lesions Psychiatric: Negative; No behavioral problems, depression Sleep: Negative; No snoring, daytime sleepiness, hypersomnolence, bruxism, restless legs, hypnogognic hallucinations, no cataplexy Other comprehensive 14 point system review is negative.   PHYSICAL EXAM:   VS:  BP  138/90   Pulse 67   Ht '5\' 1"'$  (1.549 m)   Wt 204 lb 9.6 oz (92.8 kg)   BMI 38.66 kg/m     Repeat blood pressure by me 136/90  Wt Readings from Last  3 Encounters:  05/17/19 204 lb 9.6 oz (92.8 kg)  05/13/19 204 lb (92.5 kg)  03/28/18 197 lb 12.8 oz (89.7 kg)    General: Alert, oriented, no distress.  Skin: normal turgor, no rashes, warm and dry HEENT: Normocephalic, atraumatic. Pupils equal round and reactive to light; sclera anicteric; extraocular muscles intact;  Nose without nasal septal hypertrophy Mouth/Parynx benign; Mallinpatti scale 3 Neck: No JVD, no carotid bruits; normal carotid upstroke Lungs: clear to ausculatation and percussion; no wheezing or rales Chest wall: without tenderness to palpitation Heart: PMI not displaced, RRR, s1 s2 normal, 1/6 systolic murmur, no diastolic murmur, no rubs, gallops, thrills, or heaves Abdomen: soft, nontender; no hepatosplenomehaly, BS+; abdominal aorta nontender and not dilated by palpation. Back: no CVA tenderness Pulses 2+ Musculoskeletal: full range of motion, normal strength, no joint deformities Extremities: no clubbing cyanosis or edema, Homan's sign negative  Neurologic: grossly nonfocal; Cranial nerves grossly wnl Psychologic: Normal mood and affect   Studies/Labs Reviewed:   ECG (independently read by me) :Normal sinus rhythm Poor R wave progression     December 2019 EKG:  EKG is ordered today.  Normal sinus rhythm at 72 bpm.  Left axis deviation.  Poor anterior R wave progression.  June 2019 ECG (independently read by me): Normal sinus rhythm at 67 bpm with an isolated PVC; normal intervals.  No ST segment changes.  Recent Labs: BMP Latest Ref Rng & Units 05/13/2019 12/20/2017 10/06/2017  Glucose 65 - 99 mg/dL 77 87 136(H)  BUN 8 - 27 mg/dL '24 17 18  '$ Creatinine 0.57 - 1.00 mg/dL 1.26(H) 1.16(H) 1.26(H)  BUN/Creat Ratio 12 - 28 19 - 14  Sodium 134 - 144 mmol/L 140 140 142  Potassium 3.5 - 5.2 mmol/L 5.0 4.4 4.4    Chloride 96 - 106 mmol/L 105 108 104  CO2 22 - 32 mmol/L - 23 24  Calcium 8.7 - 10.3 mg/dL 10.3 9.7 10.0     Hepatic Function Latest Ref Rng & Units 05/13/2019 12/20/2017 08/30/2017  Total Protein 6.0 - 8.5 g/dL 7.1 7.0 7.5  Albumin 3.8 - 4.8 g/dL 4.3 3.6 4.6  AST 0 - 40 IU/L '25 25 26  '$ ALT 0 - 44 U/L - 20 26  Alk Phosphatase 39 - 117 IU/L 80 66 65  Total Bilirubin 0.0 - 1.2 mg/dL 0.3 0.4 0.5    CBC Latest Ref Rng & Units 12/20/2017 08/30/2017 12/20/2016  WBC 3.9 - 10.3 K/uL 5.2 6.5 5.2  Hemoglobin 11.6 - 15.9 g/dL 11.9 14.1 11.9  Hematocrit 34.8 - 46.6 % 36.0 42.3 35.7  Platelets 145 - 400 K/uL 186 238 226   Lab Results  Component Value Date   MCV 92.0 12/20/2017   MCV 92 08/30/2017   MCV 89.6 12/20/2016   Lab Results  Component Value Date   TSH 0.681 05/13/2019   Lab Results  Component Value Date   HGBA1C 5.8 (A) 05/13/2019     BNP No results found for: BNP  ProBNP No results found for: PROBNP   Lipid Panel     Component Value Date/Time   CHOL 165 06/03/2016 1119   TRIG 316 (H) 06/03/2016 1119   HDL 56 06/03/2016 1119   CHOLHDL 2.9 06/03/2016 1119   VLDL 63 (H) 06/03/2016 1119   LDLCALC 46 06/03/2016 1119     RADIOLOGY: No results found.   Additional studies/ records that were reviewed today include:  Reviewed the records from Dr. Jeffie Pollock and Carrie Watts.  I reviewed her echo Doppler  studies and cardiac catheterization.    ASSESSMENT:    1. Essential hypertension   2. Saddle embolus of pulmonary artery without acute cor pulmonale, unspecified chronicity ([place]): January 2018   3. Anticoagulation adequate   4. Mixed hyperlipidemia   5. Bilateral swelling of feet   6. Congestive heart failure, unspecified HF chronicity, unspecified heart failure type [facility])     PLAN:  Ms. Henkes is a very pleasant 69 year old female who is originally from [place].  She moved to the [place] over 20 years ago and is now a [place] citizen.  She has a history of  hypertension, type 2 diabetes mellitus, breast carcinoma status post left breast mastectomy and had undergone chemotherapy with cyclophosphamide, Adriamycin, and Taxol.  She also history of development of a saddle pulmonary embolism and DVT and since January 2018 has been on anticoagulation therapy.  She had developed mild LV dysfunction with a EF dropping to 40 to 45% by echo Doppler study in February 2018.  Ultimately, LV function had normalized on her echo in May 2018 which showed an EF of 55 to 60%; strain  pattern was normal..  Left ventricular dimensions were in the upper normal range at 51.3 at end diastole and 38.100 and systole.  There was probable very mild aortic sclerosis and trivial mitral regurgitation.  Retrospectively, it was felt that her initial systolic dysfunction was related to massive PE rather than Adriamycin toxicity.  When she establish cardiology care with me in June 2019, she was well compensated.  Her blood pressure today is elevated despite taking carvedilol 6.25 mg twice a day in addition to losartan 100 mg.  She admits to swelling in her feet on a daily basis.  I have suggested the addition of low-dose HCTZ 12.5 mg to her medical regimen.  She will try this every other day and if swelling develops on the off day to take on a daily basis.  Her last echo Doppler study in December 2019 showed an EF of 60 to 65% with grade 1 diastolic dysfunction.  She continues to be on atorvastatin 20 mg.  Lipid studies in 2018 did show significant triglyceride elevation at 316.[name]  She is followed by Dionisio David, NP.  I discussed sodium restriction.  She is diabetic on metformin.  If she is not had recent lipid studies I have recommended that these be obtained in the fasting state.  Adjustments will be made depending upon result.  Recent hemoglobin A1c was normal at 5.8 on May 13, 2019.  She has mild stage III chronic kidney disease with most recent creatinine at 1.26.  She is not having any heart  failure symptoms.  Will be followed for her breast CA.  I will see her in 1 year for follow-up cardiology evaluation.  Medication Adjustments/Labs and Tests Ordered: Current medicines are reviewed at length with the patient today.  Concerns regarding medicines are outlined above.  Medication changes, Labs and Tests ordered today are listed in the Patient Instructions below. Patient Instructions  Medication Instructions:  BEGIN TAKING HYDROCHLOROTHIAZIDE 12.5 MG (1 CAPSULE) EVERY OTHER DAY.   IF YOU ARE STILL HAVING SWELLING, YOU MAY TAKE 1 CAPSULE (12.'5MG'$ ) DAILY *If you need a refill on your cardiac medications before your next appointment, please call your pharmacy*  Lab Work: FASTING LIPIDS If you have labs (blood work) drawn today and your tests are completely normal, you will receive your results only by: [name] MyChart Message (if you have MyChart) OR . A paper copy  in the mail If you have any lab test that is abnormal or we need to change your treatment, we will call you to review the results.  Follow-Up: At [facility], you and your health needs are our priority.  As part of our continuing mission to provide you with exceptional heart care, we have created designated Provider Care Teams.  These Care Teams include your primary Cardiologist (physician) and Advanced Practice Providers (APPs -  Physician Assistants and Nurse Practitioners) who all work together to provide you with the care you need, when you need it.  Your next appointment:   12 month(s)  The format for your next appointment:   In Person  Provider:   Shelva Majestic, MD   Currently, there is a hotline to call (active 04/19/19) to schedule vaccination appointments as no walk-ins will be accepted.   Number: [phone].     If an appointment is not available please go to [email or web address] to sign up for notification when additional vaccine appointments are available.     If you have further questions or  concerns about the vaccine process, please visit [email or web address] or contact your primary care physician.       Signed, Carrie Majestic, MD  05/19/2019 3:28 PM    [facility] [address], [place], [place], [place]  [address] Phone: [phone]

## 2019-05-19 ENCOUNTER — Encounter: Payer: Self-pay | Admitting: Cardiovascular Disease

## 2019-05-20 ENCOUNTER — Telehealth: Payer: Self-pay

## 2019-05-20 ENCOUNTER — Telehealth: Payer: Self-pay | Admitting: Cardiovascular Disease

## 2019-05-20 NOTE — Telephone Encounter (Signed)
-----   Message from Vevelyn Francois, NP sent at 05/20/2019  1:47 PM EST ----- MyChart message sent Serum creatinine slightly elevated overall kidney function continues up and down.  Would like a nephrology referral just for overall evaluation of kidney function.  Please let me know if you are interested.

## 2019-05-20 NOTE — Progress Notes (Signed)
Carrie Watts  [phone] [phone]) [phone]     ID: Carrie Watts DOB: [birth year]  MR#: [id number]  KNL#:[id number]  Patient Care Team: Lanae Boast, FNP as PCP - General (Family Medicine) Troy Sine, MD as PCP - Cardiology (Cardiology) Camree Wigington, Virgie Dad, MD as Consulting Physician (Oncology) OTHER MD:  CHIEF COMPLAINT: Estrogen receptor positive breast cancer (s/p left mastectomy)  CURRENT TREATMENT: Anastrozole   INTERVAL HISTORY: Carrie Watts returns today for follow-up and treatment of her estrogen receptor positive breast cancer accompanied by her daughter.  Carrie Watts continues on anastrozole.  I believe she is tolerating it well.  She occasionally has some chills or hot flashes.  She has a feeling of dizziness which she thinks might be related to the anastrozole but very likely is not.  Since her last visit, she had some lab work while in [place].  This showed a completely normal CBC.  Hemoglobin A1c was 6.2.. She is scheduled for right screening mammogram on 05/30/2019.    REVIEW OF SYSTEMS: Carrie Watts tells me her throat gets dry and her voice kind of goes away.  She is not sure what the cause of this is.  She recently returned from [place] and she says everything there is shot down so she mostly stayed by herself.  She requested a prescription for a walking cane.  A detailed review of systems today was otherwise stable   BREAST CANCER HISTORY: From the original intake note:  The patient tells me she herself palpated a mass in her left breast and brought it to medical attention. She underwent mammography, ultrasonography, and biopsy and then proceeded to left mastectomy and axillary lymph node dissection at [facility] in [place]. The pathology from this procedure (SFS [id number]) describes a 2.1 cm tumor, grade 2, mixed infiltrating lobular and ductal, involving multiple quadrants. The deep margin was clear. 2 of 15 lymph nodes were  involved.  The patient was then treated with what I her report seems to have been doxorubicin and cyclophosphamide given every 3 weeks 4, beginning October and completed December, at which point she was started on what sounds like paclitaxel weekly. According to her account she received 1 dose of paclitaxel 04/06/2016. At that time she moved to this area to be closer to her daughter and to receive further treatment.  Of note, on 04/24/2016 she presented to the emergency room with chest pain and shortness of breath, with a CT angiogram on that date showing a saddle embolus with bilateral lobe wall and segmental pulmonary emboli. Doppler ultrasonography the next day documented a right lower extremity deep venous thrombosis. The patient was started on Lovenox and transitioned to apixaban   Her subsequent history is as detailed below   PAST MEDICAL HISTORY: Past Medical History:  Diagnosis Date  . Angina of effort ([place])   . Atrial fibrillation ([place])   . Cancer (HCC)    breast  . CHF (congestive heart failure) ([place])   . Deep venous thrombosis ([place])   . Diabetes mellitus without complication ([place])   . Hypercholesterolemia   . Hypertension   . Pulmonary embolism ([place])   . Renal disorder   . Stroke [facility])     PAST SURGICAL HISTORY: Past Surgical History:  Procedure Laterality Date  . BREAST SURGERY    . COLONOSCOPY N/A 05/16/2016   Procedure: COLONOSCOPY;  Surgeon: [name], MD;  Location: [facility];  Service: Endoscopy;  Laterality: N/A;  . ESOPHAGOGASTRODUODENOSCOPY N/A 05/16/2016   Procedure: ESOPHAGOGASTRODUODENOSCOPY (EGD);  Surgeon: [name], MD;  Location: [facility];  Service: Endoscopy;  Laterality: N/A;  . LEFT HEART CATH AND CORONARY ANGIOGRAPHY N/A 05/18/2016   Procedure: Left Heart Cath and Coronary Angiography;  Surgeon: Jettie Booze, MD;  Location: [place] CV LAB;  Service: Cardiovascular;  Laterality: N/A;  . MASTECTOMY Left 2017  . PARTIAL  HYSTERECTOMY      FAMILY HISTORY Family History  Problem Relation Age of Onset  . Heart attack Mother   . Clotting disorder Neg Hx   The patient's father died from colorectal cancer at age 54. The patient's mother died at age 47 from heart disease. The patient has 3 brothers, 1 sister. The sister was diagnosed with breast cancer in her late 42s. The patient has a niece diagnosed with breast cancer at age 72.   GYNECOLOGIC HISTORY:  No LMP recorded. Patient has had a hysterectomy. Menarche age 48, first live birth age 38. The patient is GX P5. She status post menopause but does not recall the date. She did not take hormone replacement. She never used oral contraceptives.   SOCIAL HISTORY:  The patient used to do gardening. She is originally from [place]. She is divorced. She is currently living with her daughter Carrie Watts here in [place]. 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 The patient's other children are either in [place] or [place]. The patient has 8 grandchildren. She is a Nurse, learning disability.    ADVANCED DIRECTIVES: In place   HEALTH MAINTENANCE: Social History   Tobacco Use  . Smoking status: Never Smoker  . Smokeless tobacco: Never Used  Substance Use Topics  . Alcohol use: No  . Drug use: No     Colonoscopy: Never  PAP:  Bone density: Never   Allergies  Allergen Reactions  . Dust Mite Extract     Current Outpatient Medications  Medication Sig Dispense Refill  . acetaminophen (TYLENOL) 500 MG tablet Take 1 tablet (500 mg total) by mouth every 6 (six) hours as needed. 30 tablet 0  . anastrozole (ARIMIDEX) 1 MG tablet TAKE 1 TABLET(1 MG) BY MOUTH DAILY 90 tablet 4  . atorvastatin (LIPITOR) 20 MG tablet TAKE 1 TABLET(20 MG) BY MOUTH AT BEDTIME 90 tablet 3  . carvedilol (COREG) 6.25 MG tablet TAKE 1 TABLET(6.25 MG) BY MOUTH TWICE DAILY WITH A MEAL 180 tablet 3  . docusate sodium (COLACE) 100 MG capsule Take 100 mg by mouth daily as needed for mild constipation.    [name] ELIQUIS 2.5 MG  TABS tablet TAKE 1 TABLET BY MOUTH TWICE DAILY 60 tablet 5  . folic acid (FOLVITE) 914 MCG tablet Take 800 mcg by mouth every morning.     [name] glucose monitoring kit (FREESTYLE) monitoring kit 1 each by Does not apply route 4 (four) times daily - after meals and at bedtime. 1 month Diabetic Testing Supplies for QAC-QHS accuchecks.  Diagnosis E11.65. Any brand OK 1 each 1  . hydrochlorothiazide (MICROZIDE) 12.5 MG capsule Take 1 capsule (12.5 mg total) by mouth daily. 90 capsule 3  . losartan (COZAAR) 100 MG tablet TAKE 1 TABLET(100 MG) BY MOUTH DAILY 30 tablet 6  . Menthol, Topical Analgesic, (BIOFREEZE) 4 % GEL Apply 1 application topically 3 (three) times daily as needed. 1 Tube 5  . metFORMIN (GLUCOPHAGE) 500 MG tablet TAKE 1 TABLET(500 MG) BY MOUTH DAILY WITH BREAKFAST 90 tablet 1  . Multiple Vitamin (MULTIVITAMIN WITH MINERALS) TABS tablet Take 1 tablet by mouth every morning.     . ondansetron (ZOFRAN)  4 MG tablet Take 1 tablet (4 mg total) by mouth every 8 (eight) hours as needed for nausea or vomiting. 30 tablet 1  . ondansetron (ZOFRAN-ODT) 8 MG disintegrating tablet Take 1 tablet (8 mg total) by mouth 2 (two) times daily. 60 tablet 0  . TRUE METRIX BLOOD GLUCOSE TEST test strip USE AS DIRECTED FOUR TIMES DAILY AFTER MEALS AND AT BEDTIME 300 each 0  . TRUEPLUS LANCETS 28G MISC USE TO TEST BLOOD SUGAR FOUR TIMES DAILY 300 each 3   No current facility-administered medications for this visit.    OBJECTIVE:  African-American woman in no acute distress  Vitals:   05/21/19 1046  BP: (!) 146/74  Pulse: 84  Resp: 18  Temp: 99.1 F (37.3 C)  SpO2: 100%     Body mass index is 38.79 kg/m.    ECOG FS:2 - Symptomatic, <50% confined to bed  Sclerae unicteric, EOMs intact Wearing a mask No cervical or supraclavicular adenopathy Lungs no rales or rhonchi Heart regular rate and rhythm Abd soft, nontender, positive bowel sounds MSK no focal spinal tenderness, no upper extremity  lymphedema Neuro: well oriented, appropriate affect Breasts: The right breast is unremarkable.  The left breast is status post mastectomy with no evidence of chest wall recurrence.  Both axillae are benign.   LAB RESULTS:  CMP     Component Value Date/Time   NA 145 05/21/2019 0949   NA 140 05/13/2019 1518   NA 141 12/20/2016 0755   K 4.6 05/21/2019 0949   K 4.4 12/20/2016 0755   CL 110 05/21/2019 0949   CO2 28 05/21/2019 0949   CO2 24 12/20/2016 0755   GLUCOSE 216 (H) 05/21/2019 0949   GLUCOSE 140 12/20/2016 0755   BUN 19 05/21/2019 0949   BUN 24 05/13/2019 1518   BUN 17.0 12/20/2016 0755   CREATININE 1.32 (H) 05/21/2019 0949   CREATININE 1.1 12/20/2016 0755   CALCIUM 9.3 05/21/2019 0949   CALCIUM 9.8 12/20/2016 0755   PROT 7.1 05/21/2019 0949   PROT 7.1 05/13/2019 1518   PROT 6.9 12/20/2016 0755   ALBUMIN 3.7 05/21/2019 0949   ALBUMIN 4.3 05/13/2019 1518   ALBUMIN 3.5 12/20/2016 0755   AST 23 05/21/2019 0949   AST 18 12/20/2016 0755   ALT 24 05/21/2019 0949   ALT 18 12/20/2016 0755   ALKPHOS 78 05/21/2019 0949   ALKPHOS 56 12/20/2016 0755   BILITOT 0.5 05/21/2019 0949   BILITOT 0.3 05/13/2019 1518   BILITOT 0.52 12/20/2016 0755   GFRNONAA 41 (L) 05/21/2019 0949   GFRNONAA 52 (L) 09/07/2016 1021   GFRAA 47 (L) 05/21/2019 0949   GFRAA 59 (L) 09/07/2016 1021    INo results found for: SPEP, UPEP  Lab Results  Component Value Date   WBC 8.1 05/21/2019   NEUTROABS 5.5 05/21/2019   HGB 12.3 05/21/2019   HCT 37.8 05/21/2019   MCV 93.8 05/21/2019   PLT 231 05/21/2019      Chemistry      Component Value Date/Time   NA 145 05/21/2019 0949   NA 140 05/13/2019 1518   NA 141 12/20/2016 0755   K 4.6 05/21/2019 0949   K 4.4 12/20/2016 0755   CL 110 05/21/2019 0949   CO2 28 05/21/2019 0949   CO2 24 12/20/2016 0755   BUN 19 05/21/2019 0949   BUN 24 05/13/2019 1518   BUN 17.0 12/20/2016 0755   CREATININE 1.32 (H) 05/21/2019 0949   CREATININE 1.1 12/20/2016  0755  Component Value Date/Time   CALCIUM 9.3 05/21/2019 0949   CALCIUM 9.8 12/20/2016 0755   ALKPHOS 78 05/21/2019 0949   ALKPHOS 56 12/20/2016 0755   AST 23 05/21/2019 0949   AST 18 12/20/2016 0755   ALT 24 05/21/2019 0949   ALT 18 12/20/2016 0755   BILITOT 0.5 05/21/2019 0949   BILITOT 0.3 05/13/2019 1518   BILITOT 0.52 12/20/2016 0755       No results found for: LABCA2  No components found for: LABCA125  No results for input(s): INR in the last 168 hours.  Urinalysis    Component Value Date/Time   COLORURINE YELLOW 05/27/2016 0853   APPEARANCEUR CLEAR 05/27/2016 0853   LABSPEC 1.010 12/28/2016 1033   PHURINE 5.5 12/28/2016 1033   GLUCOSEU NEGATIVE 12/28/2016 1033   HGBUR TRACE (A) 12/28/2016 1033   BILIRUBINUR neg 05/13/2019 1433   KETONESUR NEGATIVE 12/28/2016 1033   PROTEINUR Negative 05/13/2019 1433   PROTEINUR NEGATIVE 12/28/2016 1033   UROBILINOGEN 0.2 05/13/2019 1433   UROBILINOGEN 0.2 12/28/2016 1033   NITRITE neg 05/13/2019 1433   NITRITE NEGATIVE 12/28/2016 1033   LEUKOCYTESUR Moderate (2+) (A) 05/13/2019 1433     STUDIES: No results found.    ELIGIBLE FOR AVAILABLE RESEARCH PROTOCOL: no  ASSESSMENT: 69 y.o. [place] woman originally from [place], status post left mastectomy and sentinel lymph node sampling 07/29/2015 for a pT2 pN1, stage IIB invasive breast cancer, mixed ductal and lobular, strongly estrogen and progesterone receptor positive, HER-2 negative by FISH  (1) she received adjuvant chemotherapy consisting (most likely) of cyclophosphamide and doxorubicin 4 given between October and December 2017, then started paclitaxel 04/06/2016, discontinued after one dose because of neuropathy and other complications  (2) considered adjuvant radiation, decided against it due to delay  (3) anastrozole started 05/11/2016  (a) bone density 07/13/2016 normal with a T score of -0.2  (4) pulmonary embolus and right lower extremity deep vein  thrombosis diagnosed 04/24/2016,  (a) status post enoxaparin 04/24/2016 through 05/03/2016  (b) transitioned to apixaban 05/03/2016  (5) status post stroke with moderate residual   PLAN: Rafael is now 3-1/2 years out from definitive surgery for her breast cancer with no evidence of disease recurrence.  This is very favorable.  She is 3 years into anastrozole.  She would like to stop early but we have encouraged her to continue for an additional 2 years to complete the 5 years which are standard.  She will need a bone density and I have put in the order so she can have this done at the same time as her mammogram, which is scheduled her ready for 05/30/2019 at the breast center.  I wrote her for a cane.  I also think she would benefit from physical rehabilitation for her concern regarding dizziness.  There may be an element of vertigo there.  She is agreeable.  Otherwise she will return to see me in a year.  She knows to call for any other issues that may develop before then.  Total encounter time 25 minutes.*  Avrie Kedzierski, Virgie Dad, MD  05/21/19 11:30 AM Medical Oncology and Hematology [facility] [place], [place] [address] Tel. [phone]    Fax. [phone]   I, Wilburn Mylar, am acting as scribe for Dr. Virgie Dad. Storm Sovine.  I, Lurline Del MD, have reviewed the above documentation for accuracy and completeness, and I agree with the above.    *Total Encounter Time as defined by the Centers for Medicare and Medicaid  Services includes, in addition to the face-to-face time of a patient visit (documented in the note above) non-face-to-face time: obtaining and reviewing outside history, ordering and reviewing medications, tests or procedures, care coordination (communications with other health care professionals or caregivers) and documentation in the medical record.

## 2019-05-20 NOTE — Telephone Encounter (Signed)
Contacted patient's daughter Guy Begin) She states that pt does seen nephrology and saw them last on 05/08/19 with [facility] Dr. Jane Canary. She has a 6 month follow up with them. I will fax over labs from our office to them today. Thanks!

## 2019-05-20 NOTE — Telephone Encounter (Signed)
Pt c/o medication issue:  1. Name of Medication:   carvedilol (COREG) 6.25 MG tablet    losartan (COZAAR) 100 MG tablet   2. How are you currently taking this medication (dosage and times per day)?  Carvedilol is taken 1 tablet by mouth two times daily with meal Losartan is taken 1 tablet by mouth daily  3. Are you having a reaction (difficulty breathing--STAT)? No  4. What is your medication issue? Patient is calling wanting to know if she is to continue taking listed medications due to Dr. Claiborne Billings starting her on hydrochlorothiazide (MICROZIDE) 12.5 MG capsule. Patient would also like to know when she is due for and Echo since her last one was performed on 03/22/18. Please advise.

## 2019-05-20 NOTE — Telephone Encounter (Signed)
Pt aware to continue taking Carvedilol 6.25 mg and Losartan 100 mg in addition to HCTZ  12.5 mg every other day  Pt was asking when should she have repeat echo done last one was 2019 and pt noted chest pain last year while in [place] .

## 2019-05-21 ENCOUNTER — Telehealth: Payer: Self-pay | Admitting: Cardiovascular Disease

## 2019-05-21 ENCOUNTER — Inpatient Hospital Stay (HOSPITAL_BASED_OUTPATIENT_CLINIC_OR_DEPARTMENT_OTHER): Payer: Medicare Other | Admitting: Oncology

## 2019-05-21 ENCOUNTER — Inpatient Hospital Stay: Payer: Medicare Other | Attending: Oncology

## 2019-05-21 ENCOUNTER — Other Ambulatory Visit: Payer: Self-pay

## 2019-05-21 VITALS — BP 146/74 | HR 84 | Temp 99.1°F | Resp 18 | Ht 61.0 in | Wt 205.3 lb

## 2019-05-21 DIAGNOSIS — C50912 Malignant neoplasm of unspecified site of left female breast: Secondary | ICD-10-CM

## 2019-05-21 DIAGNOSIS — I5022 Chronic systolic (congestive) heart failure: Secondary | ICD-10-CM

## 2019-05-21 DIAGNOSIS — R42 Dizziness and giddiness: Secondary | ICD-10-CM | POA: Insufficient documentation

## 2019-05-21 DIAGNOSIS — Z9012 Acquired absence of left breast and nipple: Secondary | ICD-10-CM | POA: Diagnosis not present

## 2019-05-21 DIAGNOSIS — C50812 Malignant neoplasm of overlapping sites of left female breast: Secondary | ICD-10-CM

## 2019-05-21 DIAGNOSIS — I10 Essential (primary) hypertension: Secondary | ICD-10-CM

## 2019-05-21 DIAGNOSIS — C50112 Malignant neoplasm of central portion of left female breast: Secondary | ICD-10-CM

## 2019-05-21 DIAGNOSIS — Z170 Estrogen receptor positive status [ER+]: Secondary | ICD-10-CM | POA: Diagnosis not present

## 2019-05-21 DIAGNOSIS — C779 Secondary and unspecified malignant neoplasm of lymph node, unspecified: Secondary | ICD-10-CM

## 2019-05-21 DIAGNOSIS — R6883 Chills (without fever): Secondary | ICD-10-CM | POA: Diagnosis not present

## 2019-05-21 DIAGNOSIS — IMO0002 Reserved for concepts with insufficient information to code with codable children: Secondary | ICD-10-CM

## 2019-05-21 DIAGNOSIS — I509 Heart failure, unspecified: Secondary | ICD-10-CM

## 2019-05-21 DIAGNOSIS — I2699 Other pulmonary embolism without acute cor pulmonale: Secondary | ICD-10-CM | POA: Diagnosis not present

## 2019-05-21 DIAGNOSIS — I82401 Acute embolism and thrombosis of unspecified deep veins of right lower extremity: Secondary | ICD-10-CM | POA: Insufficient documentation

## 2019-05-21 DIAGNOSIS — Z7901 Long term (current) use of anticoagulants: Secondary | ICD-10-CM | POA: Diagnosis not present

## 2019-05-21 DIAGNOSIS — I272 Pulmonary hypertension, unspecified: Secondary | ICD-10-CM

## 2019-05-21 DIAGNOSIS — E1165 Type 2 diabetes mellitus with hyperglycemia: Secondary | ICD-10-CM

## 2019-05-21 DIAGNOSIS — E118 Type 2 diabetes mellitus with unspecified complications: Secondary | ICD-10-CM

## 2019-05-21 DIAGNOSIS — I2510 Atherosclerotic heart disease of native coronary artery without angina pectoris: Secondary | ICD-10-CM

## 2019-05-21 LAB — CBC WITH DIFFERENTIAL/PLATELET
Abs Immature Granulocytes: 0.03 10*3/uL (ref 0.00–0.07)
Basophils Absolute: 0 10*3/uL (ref 0.0–0.1)
Basophils Relative: 0 %
Eosinophils Absolute: 0.5 10*3/uL (ref 0.0–0.5)
Eosinophils Relative: 6 %
HCT: 37.8 % (ref 36.0–46.0)
Hemoglobin: 12.3 g/dL (ref 12.0–15.0)
Immature Granulocytes: 0 %
Lymphocytes Relative: 20 %
Lymphs Abs: 1.7 10*3/uL (ref 0.7–4.0)
MCH: 30.5 pg (ref 26.0–34.0)
MCHC: 32.5 g/dL (ref 30.0–36.0)
MCV: 93.8 fL (ref 80.0–100.0)
Monocytes Absolute: 0.4 10*3/uL (ref 0.1–1.0)
Monocytes Relative: 5 %
Neutro Abs: 5.5 10*3/uL (ref 1.7–7.7)
Neutrophils Relative %: 69 %
Platelets: 231 10*3/uL (ref 150–400)
RBC: 4.03 MIL/uL (ref 3.87–5.11)
RDW: 13.1 % (ref 11.5–15.5)
WBC: 8.1 10*3/uL (ref 4.0–10.5)
nRBC: 0 % (ref 0.0–0.2)

## 2019-05-21 LAB — COMPREHENSIVE METABOLIC PANEL
ALT: 24 U/L (ref 0–44)
AST: 23 U/L (ref 15–41)
Albumin: 3.7 g/dL (ref 3.5–5.0)
Alkaline Phosphatase: 78 U/L (ref 38–126)
Anion gap: 7 (ref 5–15)
BUN: 19 mg/dL (ref 8–23)
CO2: 28 mmol/L (ref 22–32)
Calcium: 9.3 mg/dL (ref 8.9–10.3)
Chloride: 110 mmol/L (ref 98–111)
Creatinine, Ser: 1.32 mg/dL — ABNORMAL HIGH (ref 0.44–1.00)
GFR calc Af Amer: 47 mL/min — ABNORMAL LOW (ref >60)
GFR calc non Af Amer: 41 mL/min — ABNORMAL LOW (ref >60)
Glucose, Bld: 216 mg/dL — ABNORMAL HIGH (ref 70–99)
Potassium: 4.6 mmol/L (ref 3.5–5.1)
Sodium: 145 mmol/L (ref 135–145)
Total Bilirubin: 0.5 mg/dL (ref 0.3–1.2)
Total Protein: 7.1 g/dL (ref 6.5–8.1)

## 2019-05-21 MED ORDER — ANASTROZOLE 1 MG PO TABS: 1.0000 mg | ORAL_TABLET | Freq: Every day | ORAL | 4 refills | Status: DC

## 2019-05-21 NOTE — Addendum Note (Signed)
Addended by: Chauncey Cruel on: 05/21/2019 11:35 AM   Modules accepted: Orders

## 2019-05-21 NOTE — Telephone Encounter (Signed)
DPR on file. Spoke with pt daughter Percell Boston who states pt currently in bathroom and she will relay info discussed to her. Informed pt daughter that triage nurse reviewed recent lab work from today and saw GFR and ser creat elevated. She states pt was recently seen in PCP office and advised to f/u with a nephrologist. She states pt has seen one in the past and they will f/u, but they would like some feedback from Dr. Claiborne Billings regarding kidney func and pt recent start on HCTZ. Informed pt daughter that encounter to be routed to Dr. Claiborne Billings and primary nurse

## 2019-05-21 NOTE — Telephone Encounter (Signed)
Patient is calling stating she had some labs performed recently and there is a problem with her kidney. She would like to know if this is something she and Dr. Claiborne Billings should be concerned about. Please advise.

## 2019-05-22 ENCOUNTER — Telehealth: Payer: Self-pay | Admitting: Oncology

## 2019-05-22 NOTE — Telephone Encounter (Signed)
I talk with patient regarding schedule  

## 2019-05-23 NOTE — Telephone Encounter (Signed)
Her last echo was in December 2019.  Not need one presently.  I will be seeing her back in 1 year it may be worthwhile to consider getting an echo prior to that next evaluation

## 2019-05-23 NOTE — Telephone Encounter (Signed)
Creatinine is very mildly elevated at 1.32.  I had recommended HCTZ 12.5 mg every other day or as needed for swelling.

## 2019-05-23 NOTE — Telephone Encounter (Signed)
Order for Echo placed for in December

## 2019-05-23 NOTE — Telephone Encounter (Signed)
Spoke with patient and daughter on the phone with regards to Dr.Kelly's review of kidney function and advice on HCTZ. Per Dr.Kelly "Creatinine is very mildly elevated at 1.32. I had recommended HCTZ 12.5 mg every other day or as needed for swelling." Pt's daughter had question regarding a possible follow up echo. Per dr. Claiborne Billings, pt had an ECHO done in December of 2019 and does not require an ECHO as of presently, but that perhaps the patient could get one before her year follow up. Possibly in December as 2 year follow up ECHO. Daughter and patient agreeable.  They also inquired about her recent EKG. Per Dr.Kelly, "she was in a normal rhythm and there was no change from the last time." No other questions or concerns at this time.

## 2019-05-24 ENCOUNTER — Other Ambulatory Visit: Payer: Self-pay | Admitting: Oncology

## 2019-05-24 DIAGNOSIS — R42 Dizziness and giddiness: Secondary | ICD-10-CM | POA: Insufficient documentation

## 2019-05-29 ENCOUNTER — Ambulatory Visit: Payer: Medicare Other | Attending: Oncology | Admitting: Physical Therapy

## 2019-05-29 ENCOUNTER — Other Ambulatory Visit: Payer: Self-pay

## 2019-05-29 ENCOUNTER — Encounter: Payer: Self-pay | Admitting: Physical Therapy

## 2019-05-29 DIAGNOSIS — R42 Dizziness and giddiness: Secondary | ICD-10-CM

## 2019-05-29 DIAGNOSIS — R209 Unspecified disturbances of skin sensation: Secondary | ICD-10-CM | POA: Insufficient documentation

## 2019-05-29 DIAGNOSIS — R2681 Unsteadiness on feet: Secondary | ICD-10-CM

## 2019-05-29 DIAGNOSIS — R262 Difficulty in walking, not elsewhere classified: Secondary | ICD-10-CM | POA: Insufficient documentation

## 2019-05-29 DIAGNOSIS — R208 Other disturbances of skin sensation: Secondary | ICD-10-CM

## 2019-05-29 NOTE — Therapy (Signed)
[facility] [address] [place], [place], [address] Phone: [phone]   Fax:  [phone]  Physical Therapy Evaluation  Patient Details  Name: Carrie Watts MRN: [id number] Date of Birth: February 21, 1951 Referring Provider (PT): Magrinat, Virgie Dad, MD   Encounter Date: 05/29/2019  PT End of Session - 05/29/19 1120    Visit Number  1    Number of Visits  9    Date for PT Re-Evaluation  07/28/19    Authorization Type  Medicare and Medicaid - 10th visit PN    PT Start Time  0717    PT Stop Time  0803    PT Time Calculation (min)  46 min    Activity Tolerance  Patient tolerated treatment well    Behavior During Therapy  [facility] for tasks assessed/performed       Past Medical History:  Diagnosis Date  . Angina of effort ([place])   . Atrial fibrillation ([place])   . Cancer (HCC)    breast  . CHF (congestive heart failure) ([place])   . Deep venous thrombosis ([place])   . Diabetes mellitus without complication ([place])   . Hypercholesterolemia   . Hypertension   . Pulmonary embolism ([place])   . Renal disorder   . Stroke [facility])     Past Surgical History:  Procedure Laterality Date  . BREAST SURGERY    . COLONOSCOPY N/A 05/16/2016   Procedure: COLONOSCOPY;  Surgeon: [name], MD;  Location: [place];  Service: Endoscopy;  Laterality: N/A;  . ESOPHAGOGASTRODUODENOSCOPY N/A 05/16/2016   Procedure: ESOPHAGOGASTRODUODENOSCOPY (EGD);  Surgeon: [name], MD;  Location: [place];  Service: Endoscopy;  Laterality: N/A;  . LEFT HEART CATH AND CORONARY ANGIOGRAPHY N/A 05/18/2016   Procedure: Left Heart Cath and Coronary Angiography;  Surgeon: Jettie Booze, MD;  Location: [place] CV [facility];  Service: Cardiovascular;  Laterality: N/A;  . MASTECTOMY Left 2017  . PARTIAL HYSTERECTOMY      There were no vitals filed for this visit.   Subjective Assessment - 05/29/19 0727    Subjective  Referred by oncologist for dizziness;  pt reports it has happened for a few months - feels like she is falling forwards.  Gets worse when lying down - feels like an earthquake.    Pertinent History  LUE RESTRICTED; Afib, L breast cancer with mastectomy, CHF, angina, DVT, PE, DM, HTN, renal disorder and CVA    Patient Stated Goals  To make the dizziness go away    Currently in Pain?  No/denies         [facility] PT Assessment - 05/29/19 0729      Assessment   Medical Diagnosis  Vertigo     Referring Provider (PT)  Magrinat, Virgie Dad, MD    Onset Date/Surgical Date  05/24/19    Prior Therapy  yes SLP       Precautions   Precautions  Other (comment)    Precaution Comments  LUE RESTRICTED; Afib, L breast cancer with mastectomy, CHF, angina, DVT, PE, DM, HTN, renal disorder and CVA      Balance Screen   Has the patient fallen in the past 6 months  No      [place] residence    Living Arrangements  Children      Prior Function   Level of Independence  Independent      Observation/Other Assessments   Focus on Therapeutic Outcomes ([place])  Reporting 52% function, 48% limitation on FOTO    Other Surveys   Dizziness Handicap Inventory (DHI)    Dizziness Handicap Inventory (DHI)   38      Sensation   Light Touch  Impaired Detail    Additional Comments  chemotherapy induced neuropathy      Ambulation/Gait   Ambulation/Gait  Yes    Ambulation/Gait Assistance  6: Modified independent (Device/Increase time)    Ambulation Distance (Feet)  115 Feet    Assistive device  None    Gait Pattern  Step-through pattern;Decreased step length - right;Decreased step length - left;Decreased stride length;Shuffle    Ambulation Surface  Level;Indoor           Vestibular Assessment - 05/29/19 0734      Vestibular Assessment   General Observation  Pt ambulates with shuffling gait.  No dizziness this morning.  Last episode was last week.      Symptom Behavior   Subjective history of current  problem  Has some hearing loss - not sure if it was due to surgery, chemo or fall.  No changes to vision but needs new glasses.  Denies HA and neck pain.  Pt does have nausea but no vomiting    Type of Dizziness   Spinning    Frequency of Dizziness  intermittent    Duration of Dizziness  seconds to minutes    Symptom Nature  Motion provoked    Aggravating Factors  Lying supine;Supine to sit;Forward bending    Relieving Factors  Rest    Progression of Symptoms  Better      Oculomotor Exam   Oculomotor Alignment  Normal    Ocular ROM  WFL    Spontaneous  Absent    Gaze-induced   Absent    Smooth Pursuits  Intact    Saccades  Intact      Oculomotor Exam-Fixation Suppressed    Left Head Impulse  slight dizziness; positive    Right Head Impulse  negative      Vestibulo-Ocular Reflex   VOR to Slow Head Movement  Normal    VOR Cancellation  Normal      Positional Testing   Dix-Hallpike  Dix-Hallpike Right;Dix-Hallpike Left    Horizontal Canal Testing  Horizontal Canal Right;Horizontal Canal Left      Dix-Hallpike Right   Dix-Hallpike Right Duration  0    Dix-Hallpike Right Symptoms  No nystagmus      Dix-Hallpike Left   Dix-Hallpike Left Duration  0    Dix-Hallpike Left Symptoms  No nystagmus      Horizontal Canal Right   Horizontal Canal Right Duration  0    Horizontal Canal Right Symptoms  Normal      Horizontal Canal Left   Horizontal Canal Left Duration  0    Horizontal Canal Left Symptoms  Normal          Objective measurements completed on examination: See above findings.              PT Education - 05/29/19 1120    Education Details  clinical findings, PT POC and goals    Person(s) Educated  Patient    Methods  Explanation    Comprehension  Verbalized understanding       PT Short Term Goals - 05/29/19 1133      PT SHORT TERM GOAL #1   Title  Pt will participate in further balance and vestibular testing to further assess falls risk.  Baseline  FGA, gait velocity, MSQ not assessed to date    Time  4    Period  Weeks    Status  New    Target Date  06/28/19      PT SHORT TERM GOAL #2   Title  Pt will demonstrate independence with initial HEP    Baseline  not initiated to date    Time  4    Period  Weeks    Status  New    Target Date  06/28/19      PT SHORT TERM GOAL #3   Title  Pt will report no dizziness when transitioning from supine <> sit    Baseline  mild dizziness    Time  4    Period  Weeks    Status  New    Target Date  06/28/19        PT Long Term Goals - 05/29/19 1139      PT LONG TERM GOAL #1   Title  Pt will demonstrate independence with final HEP    Time  8    Period  Weeks    Status  New    Target Date  07/28/19      PT LONG TERM GOAL #2   Title  Pt will report no dizziness with bed mobility, turning and bending forwards    Baseline  MSQ TBD    Time  8    Period  Weeks    Status  New    Target Date  07/28/19      PT LONG TERM GOAL #3   Title  Pt will demonstrate increase in gait velocity to 2.62 ft/sec to indicate decreased falls risk    Baseline  gait velocity TBD    Time  8    Period  Weeks    Status  New    Target Date  07/28/19      PT LONG TERM GOAL #4   Title  Pt will increase FGA by 4 points to indicate decreased falls risk    Baseline  FGA TBD    Time  8    Period  Weeks    Status  New    Target Date  07/28/19      PT LONG TERM GOAL #5   Title  Pt will decrease DHI by 18 points and will improve overall function to 62% on FOTO    Baseline  DHI: 38, 52% functional level    Time  8    Period  Weeks    Status  New    Target Date  07/28/19             Plan - 05/29/19 1121    Clinical Impression Statement  Pt is a 69 year old female referred to Neuro OPPT for evaluation of vertigo.  Pt's PMH is significant for the following: Afib, L breast cancer with mastectomy, CHF, angina, DVT, PE, DM, HTN, renal disorder and CVA. The following deficits were noted during  pt's exam: disequilibrium and motion sensitivity, impaired use of VOR, impaired sensation and impaired standing balance.  Pt's subjective report does suggest that pt may have had BPPV that has spontaneously resolved but no vertigo or nystagmus noted today with positional testing.  Pt would benefit from skilled PT to address these impairments and functional limitations to maximize functional mobility independence and reduce falls risk.    Personal Factors and Comorbidities  Comorbidity 3+;Past/Current Experience;Transportation    Comorbidities  Afib, L breast cancer with mastectomy, CHF, angina, DVT, PE, DM, HTN, renal disorder and CVA    Examination-Activity Limitations  Bed Mobility;Bend;Locomotion Level;Reach Overhead;Stand    Examination-Participation Restrictions  [employer] Activity    Stability/Clinical Decision Making  Evolving/Moderate complexity    Clinical Decision Making  Moderate    Rehab Potential  Good    PT Frequency  1x / week    PT Duration  8 weeks    PT Treatment/Interventions  ADLs/Self Care Home Management;Canalith Repostioning;Gait training;Stair training;Functional mobility training;Therapeutic activities;Therapeutic exercise;Balance training;Neuromuscular re-education;Patient/family education;Vestibular    PT Next Visit Plan  If vertigo returns - reassess for BPPV; Assess MSQ. assess balance - FGA, gait velocity.  initiate vestibular and balance HEP    Consulted and Agree with Plan of Care  Patient       Patient will benefit from skilled therapeutic intervention in order to improve the following deficits and impairments:  Abnormal gait, Decreased balance, Difficulty walking, Dizziness, Impaired sensation  Visit Diagnosis: Dizziness and giddiness  Unsteadiness on feet  Other disturbances of skin sensation  Difficulty in walking, not elsewhere classified     Problem List Patient Active Problem List   Diagnosis Date Noted  . Vertigo 05/24/2019  . Change  in mole 01/04/2017  . Chronic kidney disease, stage IV (severe) ([place]) 06/08/2016  . Carcinoma of central portion of left breast in female, estrogen receptor positive ([place]) 06/07/2016  . Generalized weakness 05/27/2016  . Elevated lactic acid level 05/27/2016  . UTI (urinary tract infection) 05/27/2016  . Abdominal pain 05/27/2016  . Pulmonary embolism ([place])   . CHF (congestive heart failure) ([place])   . Essential hypertension 05/23/2016  . Benign neoplasm of ascending colon   . Uncontrolled type 2 diabetes mellitus with complication ([place])   . Pulmonary hypertension ([place])   . Primary cancer of left breast with stage 2 nodal metastasis per American Joint Committee on Cancer 7th edition (N2) ([place])   . Abnormal CT scan, liver   . Occult blood in stools   . Abnormal echocardiogram   . Elevated troponin   . Hypoxia 05/12/2016  . Symptomatic anemia 05/11/2016  . Malignant neoplasm of overlapping sites of left breast in female, estrogen receptor positive ([place]) 05/11/2016  . Hyperlipidemia   . Saddle pulmonary embolus ([place]) 04/24/2016  . Diabetes mellitus without complication ([place]) [id number]  . Stroke-like symptoms 04/24/2016  . History of cancer of left breast 04/24/2016  . Pressure injury of skin 04/24/2016    Rico Junker, PT, DPT 05/29/19    11:47 AM    [facility] [address] [place], [place], [address] Phone: [phone]   Fax:  [phone]  Name: Carrie Watts MRN: [id number] Date of Birth: [birth year]

## 2019-05-30 ENCOUNTER — Ambulatory Visit
Admission: RE | Admit: 2019-05-30 | Discharge: 2019-05-30 | Disposition: A | Discharge status: Home / Self Care | Payer: Medicare Other | Source: Ambulatory Visit | Attending: Oncology | Admitting: Oncology

## 2019-05-30 DIAGNOSIS — C50812 Malignant neoplasm of overlapping sites of left female breast: Secondary | ICD-10-CM

## 2019-05-30 DIAGNOSIS — Z1231 Encounter for screening mammogram for malignant neoplasm of breast: Secondary | ICD-10-CM

## 2019-05-30 DIAGNOSIS — C50912 Malignant neoplasm of unspecified site of left female breast: Secondary | ICD-10-CM

## 2019-05-31 NOTE — Telephone Encounter (Signed)
This has already been taken care of. Echo scheduled.

## 2019-06-04 ENCOUNTER — Other Ambulatory Visit (HOSPITAL_COMMUNITY): Payer: Medicare Other

## 2019-06-05 ENCOUNTER — Other Ambulatory Visit: Payer: Self-pay

## 2019-06-05 ENCOUNTER — Encounter: Payer: Self-pay | Admitting: Diagnostic Neuroimaging

## 2019-06-05 ENCOUNTER — Ambulatory Visit (INDEPENDENT_AMBULATORY_CARE_PROVIDER_SITE_OTHER): Payer: Medicare Other | Admitting: Diagnostic Neuroimaging

## 2019-06-05 VITALS — BP 173/101 | HR 61 | Temp 97.6°F | Ht 62.0 in | Wt 208.0 lb

## 2019-06-05 DIAGNOSIS — R2990 Unspecified symptoms and signs involving the nervous system: Secondary | ICD-10-CM | POA: Diagnosis not present

## 2019-06-05 DIAGNOSIS — I2510 Atherosclerotic heart disease of native coronary artery without angina pectoris: Secondary | ICD-10-CM

## 2019-06-05 NOTE — Progress Notes (Signed)
[facility]  PATIENT: Carrie Watts DOB: [birth year]  REFERRING CLINICIAN: Lanae Boast, FNP HISTORY FROM: patient  REASON FOR VISIT: new consult    HISTORICAL  CHIEF COMPLAINT:  Chief Complaint  Patient presents with  . Cerebrovascular Accident    rm 6 New Pt "have had a few strokes since Dec 2017; hard to walk, speak sometimes"    HISTORY OF PRESENT ILLNESS:   69 year old female here for evaluation of stroke.  Patient reports history of multiple "strokes" since 2017.  Patient was hospitalized in 2018 right-sided weakness and speech difficulties but MRI was negative for stroke.  Since that time patient has been stable.  No additional strokelike symptoms.  Continues to have intermittent stuttering speech problems, previously suspected to be a baseline/anxiety related phenomenon.    REVIEW OF SYSTEMS: Full 14 system review of systems performed and negative with exception of: As per HPI.  ALLERGIES: Allergies  Allergen Reactions  . Dust Mite Extract     HOME MEDICATIONS: Outpatient Medications Prior to Visit  Medication Sig Dispense Refill  . acetaminophen (TYLENOL) 500 MG tablet Take 1 tablet (500 mg total) by mouth every 6 (six) hours as needed. 30 tablet 0  . anastrozole (ARIMIDEX) 1 MG tablet Take 1 tablet (1 mg total) by mouth daily. 90 tablet 4  . atorvastatin (LIPITOR) 20 MG tablet TAKE 1 TABLET(20 MG) BY MOUTH AT BEDTIME 90 tablet 3  . carvedilol (COREG) 6.25 MG tablet TAKE 1 TABLET(6.25 MG) BY MOUTH TWICE DAILY WITH A MEAL 180 tablet 3  . docusate sodium (COLACE) 100 MG capsule Take 100 mg by mouth daily as needed for mild constipation.    [name] ELIQUIS 2.5 MG TABS tablet TAKE 1 TABLET BY MOUTH TWICE DAILY 60 tablet 5  . folic acid (FOLVITE) 761 MCG tablet Take 800 mcg by mouth every morning.     [name] glucose monitoring kit (FREESTYLE) monitoring kit 1 each by Does not apply route 4 (four) times daily - after meals and at bedtime. 1 month Diabetic  Testing Supplies for QAC-QHS accuchecks.  Diagnosis E11.65. Any brand OK 1 each 1  . hydrochlorothiazide (MICROZIDE) 12.5 MG capsule Take 1 capsule (12.5 mg total) by mouth daily. 90 capsule 3  . losartan (COZAAR) 100 MG tablet TAKE 1 TABLET(100 MG) BY MOUTH DAILY 30 tablet 6  . Menthol, Topical Analgesic, (BIOFREEZE) 4 % GEL Apply 1 application topically 3 (three) times daily as needed. 1 Tube 5  . metFORMIN (GLUCOPHAGE) 500 MG tablet TAKE 1 TABLET(500 MG) BY MOUTH DAILY WITH BREAKFAST 90 tablet 1  . Multiple Vitamin (MULTIVITAMIN WITH MINERALS) TABS tablet Take 1 tablet by mouth every morning.     . ondansetron (ZOFRAN) 4 MG tablet Take 1 tablet (4 mg total) by mouth every 8 (eight) hours as needed for nausea or vomiting. 30 tablet 1  . ondansetron (ZOFRAN-ODT) 8 MG disintegrating tablet Take 1 tablet (8 mg total) by mouth 2 (two) times daily. 60 tablet 0  . TRUE METRIX BLOOD GLUCOSE TEST test strip USE AS DIRECTED FOUR TIMES DAILY AFTER MEALS AND AT BEDTIME 300 each 0  . TRUEPLUS LANCETS 28G MISC USE TO TEST BLOOD SUGAR FOUR TIMES DAILY 300 each 3   No facility-administered medications prior to visit.    PAST MEDICAL HISTORY: Past Medical History:  Diagnosis Date  . Angina of effort ([place])   . Atrial fibrillation ([place])   . Cancer (HCC)    breast  . CHF (congestive heart failure) ([place])   .  Deep venous thrombosis ([place])   . Diabetes mellitus without complication ([place])   . Hypercholesterolemia   . Hypertension   . Pulmonary embolism ([place])   . Renal disorder   . Stroke [facility])    "a few strokes, first one Dec 2017, next in 2018"    PAST SURGICAL HISTORY: Past Surgical History:  Procedure Laterality Date  . BREAST SURGERY    . COLONOSCOPY N/A 05/16/2016   Procedure: COLONOSCOPY;  Surgeon: [name], MD;  Location: [place];  Service: Endoscopy;  Laterality: N/A;  . ESOPHAGOGASTRODUODENOSCOPY N/A 05/16/2016   Procedure: ESOPHAGOGASTRODUODENOSCOPY (EGD);  Surgeon: [name], MD;  Location: [place];  Service: Endoscopy;  Laterality: N/A;  . LEFT HEART CATH AND CORONARY ANGIOGRAPHY N/A 05/18/2016   Procedure: Left Heart Cath and Coronary Angiography;  Surgeon: Jettie Booze, MD;  Location: [place] [facility];  Service: Cardiovascular;  Laterality: N/A;  . MASTECTOMY Left 2017  . PARTIAL HYSTERECTOMY      FAMILY HISTORY: Family History  Problem Relation Age of Onset  . Heart attack Mother   . Clotting disorder Neg Hx     SOCIAL HISTORY: Social History   Socioeconomic History  . Marital status: Divorced    Spouse name: Not on file  . Number of children: 5  . Years of education: Not on file  . Highest education level: Not on file  Occupational History  . Not on file  Tobacco Use  . Smoking status: Never Smoker  . Smokeless tobacco: Never Used  Substance and Sexual Activity  . Alcohol use: Yes    Comment: holidays  . Drug use: No  . Sexual activity: Not on file  Other Topics Concern  . Not on file  Social History Narrative   06/05/19 lives with daughter   Social Determinants of Health   Financial Resource Strain:   . Difficulty of Paying Living Expenses: Not on file  Food Insecurity:   . Worried About Charity fundraiser in the Last Year: Not on file  . Ran Out of Food in the Last Year: Not on file  Transportation Needs:   . Lack of Transportation (Medical): Not on file  . Lack of Transportation (Non-Medical): Not on file  Physical Activity:   . Days of Exercise per Week: Not on file  . Minutes of Exercise per Session: Not on file  Stress:   . Feeling of Stress : Not on file  Social Connections:   . Frequency of Communication with Friends and Family: Not on file  . Frequency of Social Gatherings with Friends and Family: Not on file  . Attends Religious Services: Not on file  . Active Member of Clubs or Organizations: Not on file  . Attends Archivist Meetings: Not on file  . Marital Status: Not on file   Intimate Partner Violence:   . Fear of Current or Ex-Partner: Not on file  . Emotionally Abused: Not on file  . Physically Abused: Not on file  . Sexually Abused: Not on file     PHYSICAL EXAM  GENERAL EXAM/CONSTITUTIONAL: Vitals:  Vitals:   06/05/19 1026  BP: (!) 173/101  Pulse: 61  Temp: 97.6 F (36.4 C)  Weight: 208 lb (94.3 kg)  Height: '5\' 2"'$  (1.575 m)     Body mass index is 38.04 kg/m. Wt Readings from Last 3 Encounters:  06/05/19 208 lb (94.3 kg)  05/21/19 205 lb 4.8 oz (93.1 kg)  05/17/19 204 lb 9.6 oz (  92.8 kg)     Patient is in no distress; well developed, nourished and groomed; neck is supple  CARDIOVASCULAR:  Examination of carotid arteries is normal; no carotid bruits  Regular rate and rhythm, no murmurs  Examination of peripheral vascular system by observation and palpation is normal  EYES:  Ophthalmoscopic exam of optic discs and posterior segments is normal; no papilledema or hemorrhages  No exam data present  MUSCULOSKELETAL:  Gait, strength, tone, movements noted in Neurologic exam below  NEUROLOGIC: MENTAL STATUS:  MMSE - [place] Exam 03/02/2018 12/28/2016  Orientation to time 4 5  Orientation to time comments - 5  Orientation to Place 3 4  Registration 3 3  Attention/ Calculation 4 3  Recall 3 3  Language- name 2 objects 2 2  Language- repeat 1 1  Language- follow 3 step command 3 3  Language- read & follow direction 1 1  Write a sentence 1 1  Copy design 1 1  Total score 26 27    awake, alert, oriented to person, place and time  recent and remote memory intact  normal attention and concentration  language fluent, comprehension intact, naming intact  fund of knowledge appropriate  INTERMITTENT STUTTERING SPEECH  CRANIAL NERVE:   2nd - no papilledema on fundoscopic exam  2nd, 3rd, 4th, 6th - pupils equal and reactive to light, visual fields full to confrontation, extraocular muscles intact, no nystagmus   5th - facial sensation symmetric  7th - facial strength symmetric  8th - hearing intact  9th - palate elevates symmetrically, uvula midline  11th - shoulder shrug symmetric  12th - tongue protrusion midline  MOTOR:   normal bulk and tone, full strength in the BUE, BLE; EXCEPT LIMITED IN RIGHT ARM DUE TO PAIN (COVID VACCINE SHOT #1 YESTERDAY)  SENSORY:   normal and symmetric to light touch, temperature, vibration  COORDINATION:   finger-nose-finger, fine finger movements normal  REFLEXES:   deep tendon reflexes TRACE and symmetric  GAIT/STATION:   narrow based gait     DIAGNOSTIC DATA (LABS, IMAGING, TESTING) - I reviewed patient records, labs, notes, testing and imaging myself where available.  Lab Results  Component Value Date   WBC 8.1 05/21/2019   HGB 12.3 05/21/2019   HCT 37.8 05/21/2019   MCV 93.8 05/21/2019   PLT 231 05/21/2019      Component Value Date/Time   NA 145 05/21/2019 0949   NA 140 05/13/2019 1518   NA 141 12/20/2016 0755   K 4.6 05/21/2019 0949   K 4.4 12/20/2016 0755   CL 110 05/21/2019 0949   CO2 28 05/21/2019 0949   CO2 24 12/20/2016 0755   GLUCOSE 216 (H) 05/21/2019 0949   GLUCOSE 140 12/20/2016 0755   BUN 19 05/21/2019 0949   BUN 24 05/13/2019 1518   BUN 17.0 12/20/2016 0755   CREATININE 1.32 (H) 05/21/2019 0949   CREATININE 1.1 12/20/2016 0755   CALCIUM 9.3 05/21/2019 0949   CALCIUM 9.8 12/20/2016 0755   PROT 7.1 05/21/2019 0949   PROT 7.1 05/13/2019 1518   PROT 6.9 12/20/2016 0755   ALBUMIN 3.7 05/21/2019 0949   ALBUMIN 4.3 05/13/2019 1518   ALBUMIN 3.5 12/20/2016 0755   AST 23 05/21/2019 0949   AST 18 12/20/2016 0755   ALT 24 05/21/2019 0949   ALT 18 12/20/2016 0755   ALKPHOS 78 05/21/2019 0949   ALKPHOS 56 12/20/2016 0755   BILITOT 0.5 05/21/2019 0949   BILITOT 0.3 05/13/2019 1518  BILITOT 0.52 12/20/2016 0755   GFRNONAA 41 (L) 05/21/2019 0949   GFRNONAA 52 (L) 09/07/2016 1021   GFRAA 47 (L) 05/21/2019  0949   GFRAA 59 (L) 09/07/2016 1021   Lab Results  Component Value Date   CHOL 165 06/03/2016   HDL 56 06/03/2016   LDLCALC 46 06/03/2016   TRIG 316 (H) 06/03/2016   CHOLHDL 2.9 06/03/2016   Lab Results  Component Value Date   HGBA1C 5.8 (A) 05/13/2019   Lab Results  Component Value Date   VITAMINB12 478 05/13/2016   Lab Results  Component Value Date   TSH 0.681 05/13/2019    04/24/16 Mr Brain Wo Contrast - Moderate atrophy with advanced nonacute white matter signal abnormality. No visible acute stroke. Chiari I malformation. 9 mm tonsillar descent without visible hydromyelia. Within limits for assessment on noncontrast brain, no intracranial metastatic disease or visible osseous disease. No intracranial stenosis or occlusion.     ASSESSMENT AND PLAN  69 y.o. year old female here with:  Dx:  1. Stroke-like symptoms     PLAN:  chronic small vessel ischemic disease  - continue medical risk factor mgmt (eliquis, statin, BP and DM control)  stuttering speech - ? functional / stress reaction (noted to be similar in 2018)  Return for return to PCP, pending if symptoms worsen or fail to improve.    Penni Bombard, MD 6/72/8979, 15:04 AM Certified in Neurology, Neurophysiology and Neuroimaging  [facility] [address] [place], [place] [address] [phone]

## 2019-06-09 ENCOUNTER — Other Ambulatory Visit: Payer: Self-pay | Admitting: Family Medicine

## 2019-06-10 ENCOUNTER — Other Ambulatory Visit (HOSPITAL_COMMUNITY): Payer: Self-pay

## 2019-06-17 ENCOUNTER — Other Ambulatory Visit: Payer: Self-pay

## 2019-06-17 ENCOUNTER — Ambulatory Visit
Admission: RE | Admit: 2019-06-17 | Discharge: 2019-06-17 | Disposition: A | Discharge status: Home / Self Care | Payer: Medicare Other | Source: Ambulatory Visit | Attending: Oncology | Admitting: Oncology

## 2019-06-17 DIAGNOSIS — Z13820 Encounter for screening for osteoporosis: Secondary | ICD-10-CM | POA: Diagnosis not present

## 2019-06-17 DIAGNOSIS — E1165 Type 2 diabetes mellitus with hyperglycemia: Secondary | ICD-10-CM

## 2019-06-17 DIAGNOSIS — Z170 Estrogen receptor positive status [ER+]: Secondary | ICD-10-CM

## 2019-06-17 DIAGNOSIS — C50912 Malignant neoplasm of unspecified site of left female breast: Secondary | ICD-10-CM

## 2019-06-17 DIAGNOSIS — C50112 Malignant neoplasm of central portion of left female breast: Secondary | ICD-10-CM

## 2019-06-17 DIAGNOSIS — Z780 Asymptomatic menopausal state: Secondary | ICD-10-CM | POA: Diagnosis not present

## 2019-06-17 DIAGNOSIS — C50812 Malignant neoplasm of overlapping sites of left female breast: Secondary | ICD-10-CM

## 2019-06-17 DIAGNOSIS — IMO0002 Reserved for concepts with insufficient information to code with codable children: Secondary | ICD-10-CM

## 2019-06-17 DIAGNOSIS — I272 Pulmonary hypertension, unspecified: Secondary | ICD-10-CM

## 2019-06-17 DIAGNOSIS — C779 Secondary and unspecified malignant neoplasm of lymph node, unspecified: Secondary | ICD-10-CM

## 2019-06-18 ENCOUNTER — Ambulatory Visit: Payer: Medicare Other | Admitting: Physical Therapy

## 2019-06-20 ENCOUNTER — Encounter: Payer: Self-pay | Admitting: Physical Therapy

## 2019-06-20 ENCOUNTER — Ambulatory Visit: Payer: Medicare Other | Attending: Oncology | Admitting: Physical Therapy

## 2019-06-20 ENCOUNTER — Other Ambulatory Visit: Payer: Self-pay

## 2019-06-20 DIAGNOSIS — R42 Dizziness and giddiness: Secondary | ICD-10-CM | POA: Diagnosis not present

## 2019-06-20 NOTE — Patient Instructions (Signed)
Benign Positional Vertigo Vertigo is the feeling that you or your surroundings are moving when they are not. Benign positional vertigo is the most common form of vertigo. This is usually a harmless condition (benign). This condition is positional. This means that symptoms are triggered by certain movements and positions. This condition can be dangerous if it occurs while you are doing something that could cause harm to you or others. This includes activities such as driving or operating machinery. What are the causes? In many cases, the cause of this condition is not known. It may be caused by a disturbance in an area of the inner ear that helps your brain to sense movement and balance. This disturbance can be caused by:  Viral infection (labyrinthitis).  Head injury.  Repetitive motion, such as jumping, dancing, or running. What increases the risk? You are more likely to develop this condition if:  You are a woman.  You are 50 years of age or older. What are the signs or symptoms? Symptoms of this condition usually happen when you move your head or your eyes in different directions. Symptoms may start suddenly, and usually last for less than a minute. They include:  Loss of balance and falling.  Feeling like you are spinning or moving.  Feeling like your surroundings are spinning or moving.  Nausea and vomiting.  Blurred vision.  Dizziness.  Involuntary eye movement (nystagmus). Symptoms can be mild and cause only minor problems, or they can be severe and interfere with daily life. Episodes of benign positional vertigo may return (recur) over time. Symptoms may improve over time. How is this diagnosed? This condition may be diagnosed based on:  Your medical history.  Physical exam of the head, neck, and ears.  Tests, such as: ? MRI. ? CT scan. ? Eye movement tests. Your health care provider may ask you to change positions quickly while he or she watches you for symptoms  of benign positional vertigo, such as nystagmus. Eye movement may be tested with a variety of exams that are designed to evaluate or stimulate vertigo. ? An electroencephalogram (EEG). This records electrical activity in your brain. ? Hearing tests. You may be referred to a health care provider who specializes in ear, nose, and throat (ENT) problems (otolaryngologist) or a provider who specializes in disorders of the nervous system (neurologist). How is this treated?  This condition may be treated in a session in which your health care provider moves your head in specific positions to adjust your inner ear back to normal. Treatment for this condition may take several sessions. Surgery may be needed in severe cases, but this is rare. In some cases, benign positional vertigo may resolve on its own in 2-4 weeks. Follow these instructions at home: Safety  Move slowly. Avoid sudden body or head movements or certain positions, as told by your health care provider.  Avoid driving until your health care provider says it is safe for you to do so.  Avoid operating heavy machinery until your health care provider says it is safe for you to do so.  Avoid doing any tasks that would be dangerous to you or others if vertigo occurs.  If you have trouble walking or keeping your balance, try using a cane for stability. If you feel dizzy or unstable, sit down right away.  Return to your normal activities as told by your health care provider. Ask your health care provider what activities are safe for you. General instructions  Take over-the-counter   and prescription medicines only as told by your health care provider.  Drink enough fluid to keep your urine pale yellow.  Keep all follow-up visits as told by your health care provider. This is important. Contact a health care provider if:  You have a fever.  Your condition gets worse or you develop new symptoms.  Your family or friends notice any  behavioral changes.  You have nausea or vomiting that gets worse.  You have numbness or a "pins and needles" sensation. Get help right away if you:  Have difficulty speaking or moving.  Are always dizzy.  Faint.  Develop severe headaches.  Have weakness in your legs or arms.  Have changes in your hearing or vision.  Develop a stiff neck.  Develop sensitivity to light. Summary  Vertigo is the feeling that you or your surroundings are moving when they are not. Benign positional vertigo is the most common form of vertigo.  The cause of this condition is not known. It may be caused by a disturbance in an area of the inner ear that helps your brain to sense movement and balance.  Symptoms include loss of balance and falling, feeling that you or your surroundings are moving, nausea and vomiting, and blurred vision.  This condition can be diagnosed based on symptoms, physical exam, and other tests, such as MRI, CT scan, eye movement tests, and hearing tests.  Follow safety instructions as told by your health care provider. You will also be told when to contact your health care provider in case of problems. This information is not intended to replace advice given to you by your health care provider. Make sure you discuss any questions you have with your health care provider. Document Revised: 09/06/2017 Document Reviewed: 09/06/2017 Elsevier Patient Education  2020 Elsevier Inc.  How to Perform the Epley Maneuver The Epley maneuver is an exercise that relieves symptoms of vertigo. Vertigo is the feeling that you or your surroundings are moving when they are not. When you feel vertigo, you may feel like the room is spinning and have trouble walking. Dizziness is a little different than vertigo. When you are dizzy, you may feel unsteady or light-headed. You can do this maneuver at home whenever you have symptoms of vertigo. You can do it up to 3 times a day until your symptoms go  away. Even though the Epley maneuver may relieve your vertigo for a few weeks, it is possible that your symptoms will return. This maneuver relieves vertigo, but it does not relieve dizziness. What are the risks? If it is done correctly, the Epley maneuver is considered safe. Sometimes it can lead to dizziness or nausea that goes away after a short time. If you develop other symptoms, such as changes in vision, weakness, or numbness, stop doing the maneuver and call your health care provider. How to perform the Epley maneuver 1. Sit on the edge of a bed or table with your back straight and your legs extended or hanging over the edge of the bed or table. 2. Turn your head halfway toward the affected ear or side. 3. Lie backward quickly with your head turned until you are lying flat on your back. You may want to position a pillow under your shoulders. 4. Hold this position for 30 seconds. You may experience an attack of vertigo. This is normal. 5. Turn your head to the opposite direction until your unaffected ear is facing the floor. 6. Hold this position for 30 seconds. You   may experience an attack of vertigo. This is normal. Hold this position until the vertigo stops. 7. Turn your whole body to the same side as your head. Hold for another 30 seconds. 8. Sit back up. You can repeat this exercise up to 3 times a day. Follow these instructions at home:  After doing the Epley maneuver, you can return to your normal activities.  Ask your health care provider if there is anything you should do at home to prevent vertigo. He or she may recommend that you: ? Keep your head raised (elevated) with two or more pillows while you sleep. ? Do not sleep on the side of your affected ear. ? Get up slowly from bed. ? Avoid sudden movements during the day. ? Avoid extreme head movement, like looking up or bending over. Contact a health care provider if:  Your vertigo gets worse.  You have other symptoms,  including: ? Nausea. ? Vomiting. ? Headache. Get help right away if:  You have vision changes.  You have a severe or worsening headache or neck pain.  You cannot stop vomiting.  You have new numbness or weakness in any part of your body. Summary  Vertigo is the feeling that you or your surroundings are moving when they are not.  The Epley maneuver is an exercise that relieves symptoms of vertigo.  If the Epley maneuver is done correctly, it is considered safe. You can do it up to 3 times a day. This information is not intended to replace advice given to you by your health care provider. Make sure you discuss any questions you have with your health care provider. Document Revised: 03/10/2017 Document Reviewed: 02/16/2016 Elsevier Patient Education  2020 Elsevier Inc.   

## 2019-06-21 NOTE — Therapy (Signed)
[facility] [address] [place], [place], [address] Phone: [phone]   Fax:  [phone]  Physical Therapy Treatment & Discharge Summary  Patient Details  Name: Carrie Watts MRN: [id number] Date of Birth: [birth year] Referring Provider (PT): Magrinat, Virgie Dad, MD   Encounter Date: 06/20/2019  PT End of Session - 06/21/19 1053    Visit Number  2    Number of Visits  9    Date for PT Re-Evaluation  07/28/19    Authorization Type  Medicare and Medicaid - 10th visit PN    PT Start Time  1017    PT Stop Time  1100    PT Time Calculation (min)  43 min    Activity Tolerance  Patient tolerated treatment well    Behavior During Therapy  [facility] for tasks assessed/performed       Past Medical History:  Diagnosis Date  . Angina of effort ([place])   . Atrial fibrillation ([place])   . Cancer (HCC)    breast  . CHF (congestive heart failure) ([place])   . Deep venous thrombosis ([place])   . Diabetes mellitus without complication ([place])   . Hypercholesterolemia   . Hypertension   . Pulmonary embolism ([place])   . Renal disorder   . Stroke [facility])    "a few strokes, first one Dec 2017, next in 2018"    Past Surgical History:  Procedure Laterality Date  . BREAST SURGERY    . COLONOSCOPY N/A 05/16/2016   Procedure: COLONOSCOPY;  Surgeon: [name], MD;  Location: [facility];  Service: Endoscopy;  Laterality: N/A;  . ESOPHAGOGASTRODUODENOSCOPY N/A 05/16/2016   Procedure: ESOPHAGOGASTRODUODENOSCOPY (EGD);  Surgeon: [name], MD;  Location: [facility];  Service: Endoscopy;  Laterality: N/A;  . LEFT HEART CATH AND CORONARY ANGIOGRAPHY N/A 05/18/2016   Procedure: Left Heart Cath and Coronary Angiography;  Surgeon: Jettie Booze, MD;  Location: [facility];  Service: Cardiovascular;  Laterality: N/A;  . MASTECTOMY Left 2017  . PARTIAL HYSTERECTOMY      There were no vitals filed for this visit.  Subjective Assessment  - 06/20/19 1023    Subjective  Pt states the dizziness is much improved - reports she no longer has the dizziness when she lies down like she was having when she was here before (for initial eval on 05-29-19)    Pertinent History  LUE RESTRICTED; Afib, L breast cancer with mastectomy, CHF, angina, DVT, PE, DM, HTN, renal disorder and CVA    Patient Stated Goals  To make the dizziness go away    Currently in Pain?  No/denies             Vestibular Assessment - 06/21/19 0001      Positional Testing   Dix-Hallpike  Dix-Hallpike Right;Dix-Hallpike Left    Sidelying Test  Sidelying Right;Sidelying Left      Dix-Hallpike Right   Dix-Hallpike Right Duration  none    Dix-Hallpike Right Symptoms  No nystagmus      Dix-Hallpike Left   Dix-Hallpike Left Duration  none    Dix-Hallpike Left Symptoms  No nystagmus      Sidelying Right   Sidelying Right Duration  none    Sidelying Right Symptoms  No nystagmus      Sidelying Left   Sidelying Left Duration  none    Sidelying Left Symptoms  No nystagmus      Positional Sensitivities   Sit to Supine  No dizziness  Supine to Left Side  No dizziness    Supine to Right Side  No dizziness    Supine to Sitting  No dizziness    Right Hallpike  No dizziness    Up from Right Hallpike  No dizziness    Up from Left Hallpike  No dizziness    Rolling Right  No dizziness    Rolling Left  No dizziness                       PT Education - 06/21/19 1052    Education Details  pt given info on BPPV - from [place] [facility] and also from [facility]    Person(s) Educated  Patient    Methods  Explanation;Handout    Comprehension  Verbalized understanding       PT Short Term Goals - 06/21/19 1053      PT SHORT TERM GOAL #1   Title  Pt will participate in further balance and vestibular testing to further assess falls risk.    Baseline  FGA, gait velocity, MSQ not assessed to date - pt reports no vertigo on 06-20-19 - deferred this goal due to vertigo  resolved    Time  4    Period  Weeks    Status  Deferred    Target Date  06/28/19      PT SHORT TERM GOAL #2   Title  Pt will demonstrate independence with initial HEP    Baseline  Deferred due to vertigo resolved    Time  4    Period  Weeks    Status  Achieved    Target Date  06/28/19      PT SHORT TERM GOAL #3   Title  Pt will report no dizziness when transitioning from supine <> sit    Baseline  no dizziness reported with this transfer on 06-20-19 - states "some clouded movement but nothing like in the past"    Time  4    Period  Weeks    Status  Achieved    Target Date  06/28/19        PT Long Term Goals - 06/20/19 1041      PT LONG TERM GOAL #1   Title  Pt will demonstrate independence with final HEP    Baseline  Vertigo resolved at this time - pt does not wish to address balance issues as residual from previous CVA's    Time  8    Period  Weeks    Status  Deferred      PT LONG TERM GOAL #2   Title  Pt will report no dizziness with bed mobility, turning and bending forwards    Baseline  met 06-20-19    Time  8    Period  Weeks    Status  Achieved      PT LONG TERM GOAL #3   Title  Pt will demonstrate increase in gait velocity to 2.62 ft/sec to indicate decreased falls risk    Baseline  gait velocity TBD    Time  8    Period  Weeks    Status  Deferred      PT LONG TERM GOAL #4   Title  Pt will increase FGA by 4 points to indicate decreased falls risk    Baseline  FGA TBD    Time  8    Period  Weeks    Status  Deferred      PT  LONG TERM GOAL #5   Title  Pt will decrease DHI by 18 points and will improve overall function to 62% on FOTO    Baseline  pt declined to complete [place] again, stating it would be the same answers as she did not have vertigo at time of initial eval - 06-20-19    Time  8    Period  Weeks    Status  Deferred            Plan - 06/21/19 1058    Clinical Impression Statement  Pt presents with no signs or symptoms of BPPV or vertigo  at this time - it appears BPPV has fully resolved; pt states she did not have dizziness at time of initial eval on 05-29-19 but was instructed to return for a follow up visit.  Pt does report she was having severe episodic dizziness during weeks prior to PT initial eval.  No nystagmus noted with any positional testing and no c/o spinning vertigo with any movements or positions.  Goals have been met and/or deferred as pt does not wish to address balance deficits at this time due to these being chronic in nature.    Personal Factors and Comorbidities  Comorbidity 3+;Past/Current Experience;Transportation    Comorbidities  Afib, L breast cancer with mastectomy, CHF, angina, DVT, PE, DM, HTN, renal disorder and CVA    Examination-Activity Limitations  Bed Mobility;Bend;Locomotion Level;Reach Overhead;Stand    Examination-Participation Restrictions  Yard Work;Community Activity    Stability/Clinical Decision Making  Evolving/Moderate complexity    Rehab Potential  Good    PT Frequency  1x / week    PT Duration  8 weeks    PT Treatment/Interventions  ADLs/Self Care Home Management;Canalith Repostioning;Gait training;Stair training;Functional mobility training;Therapeutic activities;Therapeutic exercise;Balance training;Neuromuscular re-education;Patient/family education;Vestibular    PT Next Visit Plan  --    Consulted and Agree with Plan of Care  Patient       Patient will benefit from skilled therapeutic intervention in order to improve the following deficits and impairments:  Abnormal gait, Decreased balance, Difficulty walking, Dizziness, Impaired sensation  Visit Diagnosis: Dizziness and giddiness     Problem List Patient Active Problem List   Diagnosis Date Noted  . Vertigo 05/24/2019  . Change in mole 01/04/2017  . Chronic kidney disease, stage IV (severe) ([place]) 06/08/2016  . Carcinoma of central portion of left breast in female, estrogen receptor positive ([place]) 06/07/2016  .  Generalized weakness 05/27/2016  . Elevated lactic acid level 05/27/2016  . UTI (urinary tract infection) 05/27/2016  . Abdominal pain 05/27/2016  . Pulmonary embolism ([place])   . CHF (congestive heart failure) ([place])   . Essential hypertension 05/23/2016  . Benign neoplasm of ascending colon   . Uncontrolled type 2 diabetes mellitus with complication ([place])   . Pulmonary hypertension ([place])   . Primary cancer of left breast with stage 2 nodal metastasis per American Joint Committee on Cancer 7th edition (N2) ([place])   . Abnormal CT scan, liver   . Occult blood in stools   . Abnormal echocardiogram   . Elevated troponin   . Hypoxia 05/12/2016  . Symptomatic anemia 05/11/2016  . Malignant neoplasm of overlapping sites of left breast in female, estrogen receptor positive ([place]) 05/11/2016  . Hyperlipidemia   . 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 Diabetes mellitus without complication ([place]) 40/98/1191  . Stroke-like symptoms 04/24/2016  . History of cancer of left breast 04/24/2016  . Pressure injury of skin 04/24/2016  PHYSICAL THERAPY DISCHARGE SUMMARY  Visits from Start of Care: 2  Current functional level related to goals / functional outcomes: See above for progress towards goals - BPPV has resolved as of this time    Remaining deficits: None regarding vertigo   Education / Equipment: Pt has been instructed in etiology of BPPV and instructions for Epley maneuver for self treatment prn as pt states her daughter would be able to assist her as needed. Pt was recommended to return for treatment should BPPV re-occur since pt has not been treated with Epley due to resolution at time of eval and at follow up session - recommended she have treatment administered by PT prior to performing maneuver at home.  Pt verbalized understanding & agreement.  Plan: Patient agrees to discharge.  Patient goals were met. Patient is being discharged due to meeting the stated rehab goals.   ?????         Alda Lea, PT 06/21/2019, [facility] [address] [place], [place], [address] Phone: [phone]   Fax:  [phone]  Name: Carrie Watts MRN: [id number] Date of Birth: [birth year]

## 2019-06-26 NOTE — Therapy (Signed)
[facility] [address] [place], [place], [address] Phone: [phone]   Fax:  [phone]  Patient Details  Name: Carrie Watts MRN: [id number] Date of Birth: April 19, 1950 Referring Provider:  Cammie Sickle, NP Encounter Date: 06/26/2019  SPEECH THERAPY DISCHARGE SUMMARY  Visits from Start of Care: 2  Current functional level related to goals / functional outcomes: Pt seen last in 2019. Goals from that visit follow:  SLP Long Term Goals - 10/11/17 1007              SLP LONG TERM GOAL #1    Title  Pt will utilize compensations for dysarthria with occasional min A 10/12 sentences with occasional min A     Time  6     Period  Weeks     Status  On-going          SLP LONG TERM GOAL #2    Title  Pt will utilize compensations in 8 minute simple conversation with 3 or less dysfluencies with occasional min A over 2 sessions     Time  6     Period  Weeks     Status  On-going              Plan - 10/10/17 1350     Clinical Impression Statement  Pt with continued atypical presentation for aquired neuro disorder with what is best described as components of dysfluency, dysarthria, and apraxia of speech. This SLP also would consider a conversion disorder as a viable diagnosis due to the short pockets of WNL speech production x3 during the session today. SLP focused today's session on habitualization of fluency enhancing strategies with short verbal responses. Pt should cont skilled ST to focus on increasing pt's fluent/WNL speech, and to continue to allow SLP to skillfully listen to differentially diagnose a disorder based on neurological or a conversation basis.       Remaining deficits: Unknown, pt last seen 2019.   Education / Equipment: Compensations for fluent speech.   Plan:                                                    Patient goals were not met. Patient is being discharged due to not returning since the last  visit.  ?????        [place] ,[place], CCC-SLP  06/26/2019, 3:03 PM  [facility] [address] [place], [place], [address] Phone: [phone]   Fax:  [phone]

## 2019-08-06 DIAGNOSIS — N1831 Chronic kidney disease, stage 3a: Secondary | ICD-10-CM | POA: Diagnosis not present

## 2019-08-06 DIAGNOSIS — I639 Cerebral infarction, unspecified: Secondary | ICD-10-CM | POA: Diagnosis not present

## 2019-08-06 DIAGNOSIS — E1122 Type 2 diabetes mellitus with diabetic chronic kidney disease: Secondary | ICD-10-CM | POA: Diagnosis not present

## 2019-08-06 DIAGNOSIS — I129 Hypertensive chronic kidney disease with stage 1 through stage 4 chronic kidney disease, or unspecified chronic kidney disease: Secondary | ICD-10-CM | POA: Diagnosis not present

## 2019-08-06 DIAGNOSIS — C50919 Malignant neoplasm of unspecified site of unspecified female breast: Secondary | ICD-10-CM | POA: Diagnosis not present

## 2019-08-06 DIAGNOSIS — E785 Hyperlipidemia, unspecified: Secondary | ICD-10-CM | POA: Diagnosis not present

## 2019-08-06 DIAGNOSIS — N179 Acute kidney failure, unspecified: Secondary | ICD-10-CM | POA: Diagnosis not present

## 2019-08-06 DIAGNOSIS — I82499 Acute embolism and thrombosis of other specified deep vein of unspecified lower extremity: Secondary | ICD-10-CM | POA: Diagnosis not present

## 2019-08-06 DIAGNOSIS — I2699 Other pulmonary embolism without acute cor pulmonale: Secondary | ICD-10-CM | POA: Diagnosis not present

## 2019-08-06 DIAGNOSIS — N2581 Secondary hyperparathyroidism of renal origin: Secondary | ICD-10-CM | POA: Diagnosis not present

## 2019-08-12 ENCOUNTER — Other Ambulatory Visit: Payer: Self-pay

## 2019-08-12 ENCOUNTER — Encounter: Payer: Self-pay | Admitting: Nurse Practitioner

## 2019-08-12 ENCOUNTER — Ambulatory Visit (INDEPENDENT_AMBULATORY_CARE_PROVIDER_SITE_OTHER): Payer: Medicare Other | Admitting: Nurse Practitioner

## 2019-08-12 VITALS — BP 113/67 | HR 68 | Ht 62.0 in | Wt 206.0 lb

## 2019-08-12 DIAGNOSIS — N289 Disorder of kidney and ureter, unspecified: Secondary | ICD-10-CM

## 2019-08-12 DIAGNOSIS — E669 Obesity, unspecified: Secondary | ICD-10-CM | POA: Diagnosis not present

## 2019-08-12 DIAGNOSIS — R7309 Other abnormal glucose: Secondary | ICD-10-CM | POA: Diagnosis not present

## 2019-08-12 DIAGNOSIS — R319 Hematuria, unspecified: Secondary | ICD-10-CM | POA: Diagnosis not present

## 2019-08-12 DIAGNOSIS — J301 Allergic rhinitis due to pollen: Secondary | ICD-10-CM | POA: Diagnosis not present

## 2019-08-12 DIAGNOSIS — N183 Chronic kidney disease, stage 3 unspecified: Secondary | ICD-10-CM

## 2019-08-12 DIAGNOSIS — M79642 Pain in left hand: Secondary | ICD-10-CM

## 2019-08-12 DIAGNOSIS — R8290 Unspecified abnormal findings in urine: Secondary | ICD-10-CM

## 2019-08-12 DIAGNOSIS — M79641 Pain in right hand: Secondary | ICD-10-CM

## 2019-08-12 DIAGNOSIS — M1990 Unspecified osteoarthritis, unspecified site: Secondary | ICD-10-CM

## 2019-08-12 DIAGNOSIS — E0821 Diabetes mellitus due to underlying condition with diabetic nephropathy: Secondary | ICD-10-CM | POA: Diagnosis not present

## 2019-08-12 LAB — POCT URINALYSIS DIPSTICK
Bilirubin, UA: NEGATIVE
Color, UA: NEGATIVE
Glucose, UA: NEGATIVE
Ketones, UA: NEGATIVE
Protein, UA: NEGATIVE
Spec Grav, UA: 1.02 (ref 1.010–1.025)
Urobilinogen, UA: 0.2 E.U./dL
pH, UA: 6 (ref 5.0–8.0)

## 2019-08-12 LAB — POCT GLYCOSYLATED HEMOGLOBIN (HGB A1C)
HbA1c POC (<> result, manual entry): 6.5 % (ref 4.0–5.6)
HbA1c, POC (controlled diabetic range): 6.5 % (ref 0.0–7.0)
HbA1c, POC (prediabetic range): 6.5 % — AB (ref 5.7–6.4)
Hemoglobin A1C: 6.5 % — AB (ref 4.0–5.6)

## 2019-08-12 MED ORDER — DICLOFENAC SODIUM 1 % EX CREA: 4.0000 g | TOPICAL_CREAM | Freq: Four times a day (QID) | CUTANEOUS | 0 refills | Status: AC | PRN

## 2019-08-12 MED ORDER — NITROFURANTOIN MONOHYD MACRO 100 MG PO CAPS
100.0000 mg | ORAL_CAPSULE | Freq: Two times a day (BID) | ORAL | 0 refills | Status: AC
Start: 2019-08-12 — End: 2019-08-19

## 2019-08-12 MED ORDER — TRUEPLUS LANCETS 28G MISC: 3 refills | Status: AC

## 2019-08-12 MED ORDER — OLOPATADINE HCL 0.2 % OP SOLN: 1.0000 [drp] | Freq: Two times a day (BID) | OPHTHALMIC | 0 refills | Status: AC

## 2019-08-12 MED ORDER — MONTELUKAST SODIUM 10 MG PO TABS
10.0000 mg | ORAL_TABLET | Freq: Every day | ORAL | 3 refills | Status: DC
Start: 2019-08-12 — End: 2019-11-07

## 2019-08-12 MED ORDER — TRUE METRIX BLOOD GLUCOSE TEST VI STRP: ORAL_STRIP | 0 refills | Status: DC

## 2019-08-12 NOTE — Progress Notes (Signed)
[name], [name]  [address] Phone:  [phone]   Fax:  [phone]   Established Patient Office Visit  Subjective:  Patient ID: Carrie Watts, female    DOB: Nov 24, 1950  Age: 69 y.o. MRN: [id number]  CC:  Chief Complaint  Patient presents with  . Follow-up    3 month follow up having cramping in both hands, sniff inessn finger, icthing eye     HPI Carrie Watts presents for follow up. She  has a past medical history of Angina of effort ([place]), Atrial fibrillation ([place]), Cancer ([place]), CHF (congestive heart failure) ([place]), Deep venous thrombosis ([place]), Diabetes mellitus without complication ([place]), Hypercholesterolemia, Hypertension, Pulmonary embolism ([place]), Renal disorder, and Stroke ([place]).   Diabetes Mellitus Patient presents for follow up of diabetes. Current symptoms include: increase appetite, visual disturbances, vomitting and weight loss. Symptoms have stabilized. Patient denies foot ulcerations, hypoglycemia , nausea, paresthesia of the feet, polydipsia, polyuria, vomiting and weight loss. Evaluation to date has included: fasting blood sugar, fasting lipid panel, hemoglobin A1C and microalbuminuria.  Home sugars: BGs consistently in an acceptable range. Current treatment: Continued metformin which has been somewhat effective. Last dilated eye exam: several years ago.  Joint/Muscle Pain Patient complains of arthralgias, which have/has been present for several months. Pain is located in both MCP(s): 1st, 2nd, 3rd, 4th and 5th. The pain is described as moderate, aching, throbbing and cramping. Associated symptoms include: decreased range of motion, instability and tenderness. The patient has had no relief from arthritis medications, heat and OTC ointments (Biofreeze and herbal rub.). Related to injury: no.  Allergic Rhinitis Patient presents for evaluation of allergic symptoms.  Symptoms include itchy eyes, nasal congestion, sneezing,  swelling of eyes and watery eyes and are present in a seasonal pattern.  Precipitants include  pollen.  Treatment in the past has included eye drops:  .[name]  She has not tried any current treament and this is not effective in the patient's opinion. She would like something to help with these symptoms.   Past Medical History:  Diagnosis Date  . Angina of effort ([place])   . Atrial fibrillation ([place])   . Cancer (HCC)    breast  . CHF (congestive heart failure) ([place])   . Deep venous thrombosis ([place])   . Diabetes mellitus without complication ([place])   . Hypercholesterolemia   . Hypertension   . Pulmonary embolism ([place])   . Renal disorder   . Stroke [facility])    "a few strokes, first one Dec 2017, next in 2018"    Past Surgical History:  Procedure Laterality Date  . BREAST SURGERY    . COLONOSCOPY N/A 05/16/2016   Procedure: COLONOSCOPY;  Surgeon: [name], MD;  Location: [facility];  Service: Endoscopy;  Laterality: N/A;  . ESOPHAGOGASTRODUODENOSCOPY N/A 05/16/2016   Procedure: ESOPHAGOGASTRODUODENOSCOPY (EGD);  Surgeon: [name], MD;  Location: [facility];  Service: Endoscopy;  Laterality: N/A;  . LEFT HEART CATH AND CORONARY ANGIOGRAPHY N/A 05/18/2016   Procedure: Left Heart Cath and Coronary Angiography;  Surgeon: Jettie Booze, MD;  Location: [facility];  Service: Cardiovascular;  Laterality: N/A;  . MASTECTOMY Left 2017  . PARTIAL HYSTERECTOMY      Family History  Problem Relation Age of Onset  . Heart attack Mother   . Clotting disorder Neg Hx     Social History   Socioeconomic History  . Marital status: Divorced    Spouse name: Not  on file  . Number of children: 5  . Years of education: Not on file  . Highest education level: Not on file  Occupational History  . Not on file  Tobacco Use  . Smoking status: Never Smoker  . Smokeless tobacco: Never Used  Substance and Sexual Activity  . Alcohol use: Yes    Comment: holidays  . Drug use: No  .  Sexual activity: Not on file  Other Topics Concern  . Not on file  Social History Narrative   06/05/19 lives with daughter   Social Determinants of Health   Financial Resource Strain:   . Difficulty of Paying Living Expenses:   Food Insecurity:   . Worried About Charity fundraiser in the Last Year:   . Arboriculturist in the Last Year:   Transportation Needs:   . Film/video editor (Medical):   [name] Lack of Transportation (Non-Medical):   Physical Activity:   . Days of Exercise per Week:   . Minutes of Exercise per Session:   Stress:   . Feeling of Stress :   Social Connections:   . Frequency of Communication with Friends and Family:   . Frequency of Social Gatherings with Friends and Family:   . Attends Religious Services:   . Active Member of Clubs or Organizations:   . Attends Archivist Meetings:   [name] Marital Status:   Intimate Partner Violence:   . Fear of Current or Ex-Partner:   . Emotionally Abused:   [name] Physically Abused:   . Sexually Abused:     Outpatient Medications Prior to Visit  Medication Sig Dispense Refill  . acetaminophen (TYLENOL) 500 MG tablet Take 1 tablet (500 mg total) by mouth every 6 (six) hours as needed. 30 tablet 0  . atorvastatin (LIPITOR) 20 MG tablet TAKE 1 TABLET(20 MG) BY MOUTH AT BEDTIME 90 tablet 3  . carvedilol (COREG) 6.25 MG tablet TAKE 1 TABLET(6.25 MG) BY MOUTH TWICE DAILY WITH A MEAL 180 tablet 3  . docusate sodium (COLACE) 100 MG capsule Take 100 mg by mouth daily as needed for mild constipation.    [name] ELIQUIS 2.5 MG TABS tablet TAKE 1 TABLET BY MOUTH TWICE DAILY 60 tablet 5  . folic acid (FOLVITE) 161 MCG tablet Take 800 mcg by mouth every morning.     . hydrochlorothiazide (MICROZIDE) 12.5 MG capsule Take 1 capsule (12.5 mg total) by mouth daily. 90 capsule 3  . losartan (COZAAR) 100 MG tablet TAKE 1 TABLET(100 MG) BY MOUTH DAILY 30 tablet 6  . metFORMIN (GLUCOPHAGE) 500 MG tablet TAKE 1 TABLET(500 MG) BY MOUTH DAILY  WITH BREAKFAST 90 tablet 1  . Multiple Vitamin (MULTIVITAMIN WITH MINERALS) TABS tablet Take 1 tablet by mouth every morning.     . ondansetron (ZOFRAN) 4 MG tablet Take 1 tablet (4 mg total) by mouth every 8 (eight) hours as needed for nausea or vomiting. 30 tablet 1  . TRUE METRIX BLOOD GLUCOSE TEST test strip USE AS DIRECTED FOUR TIMES DAILY AFTER MEALS AND AT BEDTIME 300 each 0  . TRUEPLUS LANCETS 28G MISC USE TO TEST BLOOD SUGAR FOUR TIMES DAILY 300 each 3  . anastrozole (ARIMIDEX) 1 MG tablet Take 1 tablet (1 mg total) by mouth daily. 90 tablet 4  . glucose monitoring kit (FREESTYLE) monitoring kit 1 each by Does not apply route 4 (four) times daily - after meals and at bedtime. 1 month Diabetic Testing Supplies for QAC-QHS accuchecks.  Diagnosis  E11.65. Any brand OK 1 each 1  . Menthol, Topical Analgesic, (BIOFREEZE) 4 % GEL Apply 1 application topically 3 (three) times daily as needed. (Patient not taking: Reported on 08/12/2019) 1 Tube 5   No facility-administered medications prior to visit.    Allergies  Allergen Reactions  . Dust Mite Extract     ROS Review of Systems  Constitutional:       Eating well  Eyes: Positive for itching.  Respiratory: Positive for shortness of breath.        On exertion  Cardiovascular: Negative.   Gastrointestinal: Positive for constipation.       It has resolved   Musculoskeletal: Positive for arthralgias and joint swelling.       Both hands   Skin: Negative.   All other systems reviewed and are negative.     Objective:    Physical Exam  Constitutional: She is oriented to person, place, and time. She appears well-developed.  obese  HENT:  Head: Normocephalic.  Cardiovascular: Normal rate, regular rhythm, normal heart sounds and intact distal pulses.  Pulmonary/Chest: Effort normal and breath sounds normal.  Musculoskeletal:        General: Normal range of motion.     Cervical back: Normal range of motion.  Neurological: She is  oriented to person, place, and time.  Skin: Skin is warm and dry.  Psychiatric: She has a normal mood and affect. Her behavior is normal. Judgment and thought content normal.    BP 113/67 (BP Location: Left Arm, Patient Position: Sitting)   Pulse 68   Ht '5\' 2"'$  (1.575 m)   Wt 206 lb (93.4 kg)   SpO2 100%   BMI 37.68 kg/m  Wt Readings from Last 3 Encounters:  08/12/19 206 lb (93.4 kg)  06/05/19 208 lb (94.3 kg)  05/21/19 205 lb 4.8 oz (93.1 kg)     Health Maintenance Due  Topic Date Due  . COVID-19 Vaccine (1) Never done  . OPHTHALMOLOGY EXAM  04/11/2018  . FOOT EXAM  03/03/2019    There are no preventive care reminders to display for this patient.  Lab Results  Component Value Date   TSH 0.681 05/13/2019   Lab Results  Component Value Date   WBC 8.1 05/21/2019   HGB 12.3 05/21/2019   HCT 37.8 05/21/2019   MCV 93.8 05/21/2019   PLT 231 05/21/2019   Lab Results  Component Value Date   NA 145 05/21/2019   K 4.6 05/21/2019   CHLORIDE 109 12/20/2016   CO2 28 05/21/2019   GLUCOSE 216 (H) 05/21/2019   BUN 19 05/21/2019   CREATININE 1.32 (H) 05/21/2019   BILITOT 0.5 05/21/2019   ALKPHOS 78 05/21/2019   AST 23 05/21/2019   ALT 24 05/21/2019   PROT 7.1 05/21/2019   ALBUMIN 3.7 05/21/2019   CALCIUM 9.3 05/21/2019   ANIONGAP 7 05/21/2019   EGFR 57 (L) 12/20/2016   Lab Results  Component Value Date   CHOL 165 06/03/2016   Lab Results  Component Value Date   HDL 56 06/03/2016   Lab Results  Component Value Date   LDLCALC 46 06/03/2016   Lab Results  Component Value Date   TRIG 316 (H) 06/03/2016   Lab Results  Component Value Date   CHOLHDL 2.9 06/03/2016   Lab Results  Component Value Date   HGBA1C 6.5 (A) 08/12/2019   HGBA1C 6.5 08/12/2019   HGBA1C 6.5 (A) 08/12/2019   HGBA1C 6.5 08/12/2019  Assessment & Plan:   Problem List Items Addressed This Visit    None    Visit Diagnoses    Diabetes mellitus due to underlying condition with  diabetic nephropathy, without long-term current use of insulin ([place])    -  Primary   Relevant Orders   POCT Urinalysis Dipstick (Completed)   POCT glycosylated hemoglobin (Hb A1C) (Completed)   Ambulatory referral to Ophthalmology   Abnormal urine       Relevant Orders   Urine Culture   Arthritis       Relevant Orders   Sedimentation rate   Uric Acid   DG Hand Complete Left   DG Hand Complete Right   Hemoglobin A1c less than 7.0%       Hematuria, unspecified type       Stage 3 chronic kidney disease, unspecified whether stage 3a or 3b CKD       Pain in both hands       Relevant Orders   Urine Culture   DG Hand Complete Left   DG Hand Complete Right   Seasonal allergic rhinitis due to pollen       Obesity (BMI 30-39.9)       Nephropathy          Meds ordered this encounter  Medications  . glucose blood (TRUE METRIX BLOOD GLUCOSE TEST) test strip    Sig: USE AS DIRECTED FOUR TIMES DAILY AFTER MEALS AND AT BEDTIME    Dispense:  100 each    Refill:  0    Order Specific Question:   Supervising Provider    Answer:   Tresa Garter [id number]  . TRUEplus Lancets 28G MISC    Sig: USE TO TEST BLOOD SUGAR FOUR TIMES DAILY    Dispense:  300 each    Refill:  3    Order Specific Question:   Supervising Provider    Answer:   Tresa Garter [id number]  . Diclofenac Sodium 1 % CREA    Sig: Place 4 g onto the skin 4 (four) times daily as needed (Apply a 4g (4.5-in) line to each knee.).    Dispense:  100 g    Refill:  0    Order Specific Question:   Supervising Provider    Answer:   Tresa Garter [id number]  . nitrofurantoin, macrocrystal-monohydrate, (MACROBID) 100 MG capsule    Sig: Take 1 capsule (100 mg total) by mouth 2 (two) times daily for 7 days.    Dispense:  14 capsule    Refill:  0    Order Specific Question:   Supervising Provider    Answer:   Tresa Garter [id number]  . montelukast (SINGULAIR) 10 MG tablet    Sig: Take 1 tablet (10 mg total) by  mouth at bedtime.    Dispense:  30 tablet    Refill:  3    Order Specific Question:   Supervising Provider    Answer:   Tresa Garter [id number]  . Olopatadine HCl 0.2 % SOLN    Sig: Apply 1 drop to eye 2 (two) times daily.    Dispense:  2.5 mL    Refill:  0    Order Specific Question:   Supervising Provider    Answer:   Tresa Garter [[id number]]    Follow-up: Return in about 3 months (around 11/12/2019).    Vevelyn Francois, NP

## 2019-08-12 NOTE — Patient Instructions (Addendum)
Arthritis Arthritis means joint pain. It can also mean joint disease. A joint is a place where bones come together. There are more than 100 types of arthritis. What are the causes? This condition may be caused by:  Wear and tear of a joint. This is the most common cause.  A lot of acid in the blood, which leads to pain in the joint (gout).  Pain and swelling (inflammation) in a joint.  Infection of a joint.  Injuries in the joint.  A reaction to medicines (allergy). In some cases, the cause may not be known. What are the signs or symptoms? Symptoms of this condition include:  Redness at a joint.  Swelling at a joint.  Stiffness at a joint.  Warmth coming from the joint.  A fever.  A feeling of being sick. How is this treated? This condition may be treated with:  Treating the cause, if it is known.  Rest.  Raising (elevating) the joint.  Putting cold or hot packs on the joint.  Medicines to treat symptoms and reduce pain and swelling.  Shots of medicines (cortisone) into the joint. You may also be told to make changes in your life, such as doing exercises and losing weight. Follow these instructions at home: Medicines  Take over-the-counter and prescription medicines only as told by your doctor.  Do not take aspirin for pain if your doctor says that you may have gout. Activity  Rest your joint if your doctor tells you to.  Avoid activities that make the pain worse.  Exercise your joint regularly as told by your doctor. Try doing exercises like: ? Swimming. ? Water aerobics. ? Biking. ? Walking. Managing pain, stiffness, and swelling      If told, put ice on the affected area. ? Put ice in a plastic bag. ? Place a towel between your skin and the bag. ? Leave the ice on for 20 minutes, 2-3 times per day.  If your joint is swollen, raise (elevate) it above the level of your heart if told by your doctor.  If your joint feels stiff in the morning,  try taking a warm shower.  If told, put heat on the affected area. Do this as often as told by your doctor. Use the heat source that your doctor recommends, such as a moist heat pack or a heating pad. If you have diabetes, do not apply heat without asking your doctor. To apply heat: ? Place a towel between your skin and the heat source. ? Leave the heat on for 20-30 minutes. ? Remove the heat if your skin turns bright red. This is very important if you are unable to feel pain, heat, or cold. You may have a greater risk of getting burned. General instructions  Do not use any products that contain nicotine or tobacco, such as cigarettes, e-cigarettes, and chewing tobacco. If you need help quitting, ask your doctor.  Keep all follow-up visits as told by your doctor. This is important. Contact a doctor if:  The pain gets worse.  You have a fever. Get help right away if:  You have very bad pain in your joint.  You have swelling in your joint.  Your joint is red.  Many joints become painful and swollen.  You have very bad back pain.  Your leg is very weak.  You cannot control your pee (urine) or poop (stool). Summary  Arthritis means joint pain. It can also mean joint disease. A joint is a place   where bones come together.  The most common cause of this condition is wear and tear of a joint.  Symptoms of this condition include redness, swelling, or stiffness of the joint.  This condition is treated with rest, raising the joint, medicines, and putting cold or hot packs on the joint.  Follow your doctor's instructions about medicines, activity, exercises, and other home care treatments. This information is not intended to replace advice given to you by your health care provider. Make sure you discuss any questions you have with your health care provider. Document Revised: 03/05/2018 Document Reviewed: 03/05/2018 Elsevier Patient Education  2020 [employer].  Allergic Rhinitis,  Adult Allergic rhinitis is a reaction to allergens in the air. Allergens are tiny specks (particles) in the air that cause your body to have an allergic reaction. This condition cannot be passed from person to person (is not contagious). Allergic rhinitis cannot be cured, but it can be controlled. There are two types of allergic rhinitis:  Seasonal. This type is also called hay fever. It happens only during certain times of the year.  Perennial. This type can happen at any time of the year. What are the causes? This condition may be caused by:  Pollen from grasses, trees, and weeds.  House dust mites.  Pet dander.  Mold. What are the signs or symptoms? Symptoms of this condition include:  Sneezing.  Runny or stuffy nose (nasal congestion).  A lot of mucus in the back of the throat (postnasal drip).  Itchy nose.  Tearing of the eyes.  Trouble sleeping.  Being sleepy during day. How is this treated? There is no cure for this condition. You should avoid things that trigger your symptoms (allergens). Treatment can help to relieve symptoms. This may include:  Medicines that block allergy symptoms, such as antihistamines. These may be given as a shot, nasal spray, or pill.  Shots that are given until your body becomes less sensitive to the allergen (desensitization).  Stronger medicines, if all other treatments have not worked. Follow these instructions at home: Avoiding allergens   Find out what you are allergic to. Common allergens include smoke, dust, and pollen.  Avoid them if you can. These are some of the things that you can do to avoid allergens: ? Replace carpet with wood, tile, or vinyl flooring. Carpet can trap dander and dust. ? Clean any mold found in the home. ? Do not smoke. Do not allow smoking in your home. ? Change your heating and air conditioning filter at least once a month. ? During allergy season:  Keep windows closed as much as you can. If  possible, use air conditioning when there is a lot of pollen in the air.  Use a special filter for allergies with your furnace and air conditioner.  Plan outdoor activities when pollen counts are lowest. This is usually during the early morning or evening hours.  If you do go outdoors when pollen count is high, wear a special mask for people with allergies.  When you come indoors, take a shower and change your clothes before sitting on furniture or bedding. General instructions  Do not use fans in your home.  Do not hang clothes outside to dry.  Wear sunglasses to keep pollen out of your eyes.  Wash your hands right away after you touch household pets.  Take over-the-counter and prescription medicines only as told by your doctor.  Keep all follow-up visits as told by your doctor. This is important. Contact  a doctor if:  You have a fever.  You have a cough that does not go away (is persistent).  You start to make whistling sounds when you breathe (wheeze).  Your symptoms do not get better with treatment.  You have thick fluid coming from your nose.  You start to have nosebleeds. Get help right away if:  Your tongue or your lips are swollen.  You have trouble breathing.  You feel dizzy or you feel like you are going to pass out (faint).  You have cold sweats. Summary  Allergic rhinitis is a reaction to allergens in the air.  This condition may be caused by allergens. These include pollen, dust mites, pet dander, and mold.  Symptoms include a runny, itchy nose, sneezing, or tearing eyes. You may also have trouble sleeping or feel sleepy during the day.  Treatment includes taking medicines and avoiding allergens. You may also get shots or take stronger medicines.  Get help if you have a fever or a cough that does not stop. Get help right away if you are short of breath. This information is not intended to replace advice given to you by your health care provider.  Make sure you discuss any questions you have with your health care provider. Document Revised: 07/17/2018 Document Reviewed: 10/17/2017 Elsevier Patient Education  [place].

## 2019-08-13 LAB — URIC ACID: Uric Acid: 7.1 mg/dL (ref 3.0–7.2)

## 2019-08-13 LAB — SEDIMENTATION RATE: Sed Rate: 16 mm/hr (ref 0–40)

## 2019-08-15 LAB — URINE CULTURE

## 2019-08-16 ENCOUNTER — Other Ambulatory Visit: Payer: Self-pay | Admitting: Nurse Practitioner

## 2019-08-26 ENCOUNTER — Other Ambulatory Visit: Payer: Self-pay

## 2019-08-26 ENCOUNTER — Other Ambulatory Visit (HOSPITAL_COMMUNITY): Payer: Self-pay | Admitting: Cardiovascular Disease

## 2019-08-26 NOTE — Telephone Encounter (Signed)
Patient on Eliquis 2.5 mg BID for VTE phrophylaxis with history of cancer.  Dose is appropriate at this time.

## 2019-08-26 NOTE — Telephone Encounter (Signed)
55f 93.4kg Scr 1.32 Lovw/kelly 05/17/19 Pt requesting refil of eliquis 2.5mg  but qualifies for 5mg  will route to pharmd pool

## 2019-08-27 DIAGNOSIS — I10 Essential (primary) hypertension: Secondary | ICD-10-CM | POA: Diagnosis not present

## 2019-09-23 ENCOUNTER — Other Ambulatory Visit: Payer: Self-pay | Admitting: *Deleted

## 2019-09-23 MED ORDER — LOSARTAN POTASSIUM 100 MG PO TABS: ORAL_TABLET | ORAL | 3 refills | Status: AC

## 2019-10-14 ENCOUNTER — Other Ambulatory Visit: Payer: Self-pay | Admitting: Nurse Practitioner

## 2019-11-07 ENCOUNTER — Other Ambulatory Visit: Payer: Self-pay | Admitting: Nurse Practitioner

## 2019-11-13 ENCOUNTER — Ambulatory Visit: Payer: Medicare Other | Admitting: Nurse Practitioner

## 2019-11-30 ENCOUNTER — Other Ambulatory Visit: Payer: Self-pay | Admitting: Family Medicine

## 2019-11-30 DIAGNOSIS — E119 Type 2 diabetes mellitus without complications: Secondary | ICD-10-CM

## 2019-12-03 ENCOUNTER — Other Ambulatory Visit: Payer: Self-pay

## 2019-12-03 DIAGNOSIS — E119 Type 2 diabetes mellitus without complications: Secondary | ICD-10-CM

## 2019-12-03 MED ORDER — METFORMIN HCL 500 MG PO TABS: ORAL_TABLET | ORAL | 1 refills | Status: DC

## 2020-02-01 ENCOUNTER — Other Ambulatory Visit: Payer: Self-pay | Admitting: Oncology

## 2020-02-03 ENCOUNTER — Other Ambulatory Visit: Payer: Self-pay | Admitting: Nurse Practitioner

## 2020-02-03 MED ORDER — ATORVASTATIN CALCIUM 20 MG PO TABS: 20.0000 mg | ORAL_TABLET | Freq: Every day | ORAL | 3 refills | Status: DC

## 2020-02-10 ENCOUNTER — Other Ambulatory Visit: Payer: Self-pay | Admitting: Nurse Practitioner

## 2020-02-10 DIAGNOSIS — R110 Nausea: Secondary | ICD-10-CM

## 2020-02-10 MED ORDER — ONDANSETRON HCL 4 MG PO TABS: 4.0000 mg | ORAL_TABLET | Freq: Three times a day (TID) | ORAL | 1 refills | Status: AC | PRN

## 2020-02-18 ENCOUNTER — Other Ambulatory Visit (HOSPITAL_COMMUNITY): Payer: Self-pay | Admitting: Cardiovascular Disease

## 2020-02-19 NOTE — Telephone Encounter (Signed)
15f 93.4kg Scr 1.32 05/21/19 Lovw/kelly 05/17/19 Pt requests 2.5mg  eliquis but qualifies for 5mg  based on dosing guidelines. Will route to pharmd pool for further assesment

## 2020-02-19 NOTE — Telephone Encounter (Signed)
NO AFIB  Dx - indefinite anticoagulation for VTE prevention

## 2020-03-24 ENCOUNTER — Other Ambulatory Visit (HOSPITAL_COMMUNITY): Payer: Medicare Other

## 2020-05-21 ENCOUNTER — Ambulatory Visit (HOSPITAL_COMMUNITY): Payer: Medicare Other | Attending: Cardiology

## 2020-05-21 ENCOUNTER — Other Ambulatory Visit: Payer: Self-pay

## 2020-05-21 DIAGNOSIS — I10 Essential (primary) hypertension: Secondary | ICD-10-CM | POA: Insufficient documentation

## 2020-05-21 DIAGNOSIS — I509 Heart failure, unspecified: Secondary | ICD-10-CM | POA: Diagnosis not present

## 2020-05-21 DIAGNOSIS — I2510 Atherosclerotic heart disease of native coronary artery without angina pectoris: Secondary | ICD-10-CM | POA: Diagnosis not present

## 2020-05-21 DIAGNOSIS — I5022 Chronic systolic (congestive) heart failure: Secondary | ICD-10-CM | POA: Insufficient documentation

## 2020-05-21 LAB — ECHOCARDIOGRAM COMPLETE
Area-P 1/2: 3.27 cm2
S' Lateral: 3.7 cm

## 2020-05-25 ENCOUNTER — Other Ambulatory Visit: Payer: Self-pay

## 2020-05-25 ENCOUNTER — Other Ambulatory Visit: Payer: Self-pay | Admitting: Cardiovascular Disease

## 2020-05-25 DIAGNOSIS — I509 Heart failure, unspecified: Secondary | ICD-10-CM

## 2020-05-25 DIAGNOSIS — Z7901 Long term (current) use of anticoagulants: Secondary | ICD-10-CM

## 2020-05-25 DIAGNOSIS — I2692 Saddle embolus of pulmonary artery without acute cor pulmonale: Secondary | ICD-10-CM

## 2020-05-25 DIAGNOSIS — E782 Mixed hyperlipidemia: Secondary | ICD-10-CM

## 2020-05-25 MED ORDER — APIXABAN 2.5 MG PO TABS: 2.5000 mg | ORAL_TABLET | Freq: Two times a day (BID) | ORAL | 0 refills | Status: DC

## 2020-05-25 NOTE — Telephone Encounter (Signed)
Prescription refill request for Eliquis received. Indication:pe Last office visit: needs visit Scr: needs labs Age: 70 Weight: 93.4 kg  Prescription refilled

## 2020-06-03 ENCOUNTER — Other Ambulatory Visit: Payer: Self-pay | Admitting: Oncology

## 2020-06-03 ENCOUNTER — Other Ambulatory Visit: Payer: Self-pay | Admitting: Nurse Practitioner

## 2020-06-17 ENCOUNTER — Other Ambulatory Visit: Payer: Self-pay

## 2020-06-17 DIAGNOSIS — C779 Secondary and unspecified malignant neoplasm of lymph node, unspecified: Secondary | ICD-10-CM

## 2020-06-17 DIAGNOSIS — C50912 Malignant neoplasm of unspecified site of left female breast: Secondary | ICD-10-CM

## 2020-06-17 NOTE — Progress Notes (Signed)
[place]  Telephone:([phone]) [phone] Fax:(336) [phone]     ID: Carrie Watts DOB: Oct 03, 1950  MR#: [id number]  YHC#:[id number]  Patient Care Team: Vevelyn Francois, NP as PCP - General (Adult Health Nurse Practitioner) Troy Sine, MD as PCP - Cardiology (Cardiology) Treyvin Glidden, Virgie Dad, MD as Consulting Physician (Oncology) OTHER MD:  CHIEF COMPLAINT: Estrogen receptor positive breast cancer (s/p left mastectomy)  CURRENT TREATMENT: Anastrozole   INTERVAL HISTORY: Carrie Watts returns today for follow-up of her estrogen receptor positive breast cancer.  Her daughter sent her to her visit by way of Carrie Watts and the patient ended up at our [place] location.  She then had to be brought here by her daughter who unfortunately could not stay for the appointment.  Telisa continues on anastrozole.  She denies significant problems with hot flashes or vaginal dryness and she does not have problems with arthralgias or myalgias related to this medication.  Since her last visit, she underwent right screening mammography with tomography at [facility] on 05/30/2019 showing: breast density category B; no evidence of malignancy.  She also underwent bone density screening on 06/17/2019 showing a T-score of -0.5, which is considered normal.    REVIEW OF SYSTEMS: Carrie Watts tells me she is "pretty much all right".  Of course she is limited by her post stroke residual.  She walks with some difficulty.  She did not bring a cane or walker today.  She denies recent falls however.  Even though her speech is halting, she makes herself clearly understood and is able to convey not only meaning but emotion very directly.  A detailed review of systems was otherwise stable   COVID 19 VACCINATION STATUS:    BREAST CANCER HISTORY: From the original intake note:  The patient tells me she herself palpated a mass in her left breast and brought it to medical attention. She underwent mammography,  ultrasonography, and biopsy and then proceeded to left mastectomy and axillary lymph node dissection at [facility] in [place]. The pathology from this procedure (SFS [id number]) describes a 2.1 cm tumor, grade 2, mixed infiltrating lobular and ductal, involving multiple quadrants. The deep margin was clear. 2 of 15 lymph nodes were involved.  The patient was then treated with what I her report seems to have been doxorubicin and cyclophosphamide given every 3 weeks 4, beginning October and completed December, at which point she was started on what sounds like paclitaxel weekly. According to her account she received 1 dose of paclitaxel 04/06/2016. At that time she moved to this area to be closer to her daughter and to receive further treatment.  Of note, on 04/24/2016 she presented to the emergency room with chest pain and shortness of breath, with a CT angiogram on that date showing a saddle embolus with bilateral lobe wall and segmental pulmonary emboli. Doppler ultrasonography the next day documented a right lower extremity deep venous thrombosis. The patient was started on Lovenox and transitioned to apixaban   Her subsequent history is as detailed below   PAST MEDICAL HISTORY: Past Medical History:  Diagnosis Date  . Angina of effort ([place])   . Atrial fibrillation ([place])   . Cancer (HCC)    breast  . CHF (congestive heart failure) ([place])   . Deep venous thrombosis ([place])   . Diabetes mellitus without complication ([place])   . Hypercholesterolemia   . Hypertension   . Pulmonary embolism ([place])   . Renal disorder   . Stroke [facility])    "  a few strokes, first one Dec 2017, next in 2018"    PAST SURGICAL HISTORY: Past Surgical History:  Procedure Laterality Date  . BREAST SURGERY    . COLONOSCOPY N/A 05/16/2016   Procedure: COLONOSCOPY;  Surgeon: [name], MD;  Location: [employer];  Service: Endoscopy;  Laterality: N/A;  . ESOPHAGOGASTRODUODENOSCOPY N/A 05/16/2016    Procedure: ESOPHAGOGASTRODUODENOSCOPY (EGD);  Surgeon: [name], MD;  Location: [place];  Service: Endoscopy;  Laterality: N/A;  . LEFT HEART CATH AND CORONARY ANGIOGRAPHY N/A 05/18/2016   Procedure: Left Heart Cath and Coronary Angiography;  Surgeon: Jettie Booze, MD;  Location: [place] [facility];  Service: Cardiovascular;  Laterality: N/A;  . MASTECTOMY Left 2017  . PARTIAL HYSTERECTOMY      FAMILY HISTORY Family History  Problem Relation Age of Onset  . Heart attack Mother   . Clotting disorder Neg Hx   The patient's father died from colorectal cancer at age 51. The patient's mother died at age 46 from heart disease. The patient has 3 brothers, 1 sister. The sister was diagnosed with breast cancer in her late 60s. The patient has a niece diagnosed with breast cancer at age 69.   GYNECOLOGIC HISTORY:  No LMP recorded. Patient has had a hysterectomy. Menarche age 11, first live birth age 23. The patient is GX P5. She status post menopause but does not recall the date. She did not take hormone replacement. She never used oral contraceptives.   SOCIAL HISTORY:  The patient used to do gardening. She is originally from [place]. She is divorced. She is currently living with her daughter [name] here in [place]. [name] is an Therapist, sports. The patient's other children are either in [place] or [place]. The patient has 8 grandchildren. She is a Nurse, learning disability.    ADVANCED DIRECTIVES: In place   HEALTH MAINTENANCE: Social History   Tobacco Use  . Smoking status: Never Smoker  . Smokeless tobacco: Never Used  Vaping Use  . Vaping Use: Never used  Substance Use Topics  . Alcohol use: Yes    Comment: holidays  . Drug use: No     Colonoscopy: Never  PAP:  Bone density: Never   Allergies  Allergen Reactions  . Dust Mite Extract     Current Outpatient Medications  Medication Sig Dispense Refill  . acetaminophen (TYLENOL) 500 MG tablet Take 1 tablet (500 mg total)  by mouth every 6 (six) hours as needed. 30 tablet 0  . anastrozole (ARIMIDEX) 1 MG tablet Take 1 tablet (1 mg total) by mouth daily. 90 tablet 4  . apixaban (ELIQUIS) 2.5 MG TABS tablet Take 1 tablet (2.5 mg total) by mouth 2 (two) times daily. 60 tablet 0  . atorvastatin (LIPITOR) 20 MG tablet TAKE 1 TABLET(20 MG) BY MOUTH DAILY 90 tablet 3  . carvedilol (COREG) 6.25 MG tablet TAKE 1 TABLET(6.25 MG) BY MOUTH TWICE DAILY WITH A MEAL 180 tablet 3  . docusate sodium (COLACE) 100 MG capsule Take 100 mg by mouth daily as needed for mild constipation.    . folic acid (FOLVITE) 976 MCG tablet Take 800 mcg by mouth every morning.     [name] glucose monitoring kit (FREESTYLE) monitoring kit 1 each by Does not apply route 4 (four) times daily - after meals and at bedtime. 1 month Diabetic Testing Supplies for QAC-QHS accuchecks.  Diagnosis E11.65. Any brand OK 1 each 1  . hydrochlorothiazide (MICROZIDE) 12.5 MG capsule TAKE 1 CAPSULE(12.5 MG) BY MOUTH DAILY  90 capsule 3  . losartan (COZAAR) 100 MG tablet TAKE 1 TABLET(100 MG) BY MOUTH DAILY 90 tablet 3  . metFORMIN (GLUCOPHAGE) 500 MG tablet Take one tablet by mouth with breakfast 90 tablet 1  . montelukast (SINGULAIR) 10 MG tablet TAKE 1 TABLET(10 MG) BY MOUTH AT BEDTIME 30 tablet 3  . Multiple Vitamin (MULTIVITAMIN WITH MINERALS) TABS tablet Take 1 tablet by mouth every morning.     . ondansetron (ZOFRAN) 4 MG tablet Take 1 tablet (4 mg total) by mouth every 8 (eight) hours as needed for nausea or vomiting. 30 tablet 1  . TRUE METRIX BLOOD GLUCOSE TEST test strip USE AS DIRECTED FOUR TIMES DAILY AFTER MEALS AND AT BEDTIME. 100 strip 4  . TRUEplus Lancets 28G MISC USE TO TEST BLOOD SUGAR FOUR TIMES DAILY 300 each 3   No current facility-administered medications for this visit.    OBJECTIVE:  African-American woman who appears stated age  72:   06/18/20 1200  BP: 135/85  Pulse: 80  Resp: 18  Temp: (!) 97 F (36.1 C)  SpO2: 100%     Body mass  index is 38.1 kg/m.    ECOG FS:2 - Symptomatic, <50% confined to bed  Sclerae unicteric, EOMs intact Wearing a mask No cervical or supraclavicular adenopathy Lungs no rales or rhonchi Heart regular rate and rhythm Abd soft, nontender, positive bowel sounds MSK no focal spinal tenderness, no upper extremity lymphedema Neuro: With some residual from her stroke, including speech and gait but well oriented, pleasant affect Breasts: The right breast is benign.  The left breast has undergone mastectomy.  There is no evidence of chest wall recurrence.  Both axillae are benign.   LAB RESULTS:  CMP     Component Value Date/Time   NA 138 06/18/2020 1143   NA 140 05/13/2019 1518   NA 141 12/20/2016 0755   K 3.8 06/18/2020 1143   K 4.4 12/20/2016 0755   CL 106 06/18/2020 1143   CO2 25 06/18/2020 1143   CO2 24 12/20/2016 0755   GLUCOSE 190 (H) 06/18/2020 1143   GLUCOSE 140 12/20/2016 0755   BUN 25 (H) 06/18/2020 1143   BUN 24 05/13/2019 1518   BUN 17.0 12/20/2016 0755   CREATININE 1.23 (H) 06/18/2020 1143   CREATININE 1.1 12/20/2016 0755   CALCIUM 10.0 06/18/2020 1143   CALCIUM 9.8 12/20/2016 0755   PROT 7.7 06/18/2020 1143   PROT 7.1 05/13/2019 1518   PROT 6.9 12/20/2016 0755   ALBUMIN 3.7 06/18/2020 1143   ALBUMIN 4.3 05/13/2019 1518   ALBUMIN 3.5 12/20/2016 0755   AST 23 06/18/2020 1143   AST 18 12/20/2016 0755   ALT 26 06/18/2020 1143   ALT 18 12/20/2016 0755   ALKPHOS 78 06/18/2020 1143   ALKPHOS 56 12/20/2016 0755   BILITOT 0.6 06/18/2020 1143   BILITOT 0.52 12/20/2016 0755   GFRNONAA 47 (L) 06/18/2020 1143   GFRNONAA 52 (L) 09/07/2016 1021   GFRAA 47 (L) 05/21/2019 0949   GFRAA 59 (L) 09/07/2016 1021    INo results found for: SPEP, UPEP  Lab Results  Component Value Date   WBC 5.8 06/18/2020   NEUTROABS 3.1 06/18/2020   HGB 13.3 06/18/2020   HCT 40.2 06/18/2020   MCV 90.7 06/18/2020   PLT 223 06/18/2020      Chemistry      Component Value Date/Time    NA 138 06/18/2020 1143   NA 140 05/13/2019 1518   NA 141 12/20/2016 0755  K 3.8 06/18/2020 1143   K 4.4 12/20/2016 0755   CL 106 06/18/2020 1143   CO2 25 06/18/2020 1143   CO2 24 12/20/2016 0755   BUN 25 (H) 06/18/2020 1143   BUN 24 05/13/2019 1518   BUN 17.0 12/20/2016 0755   CREATININE 1.23 (H) 06/18/2020 1143   CREATININE 1.1 12/20/2016 0755      Component Value Date/Time   CALCIUM 10.0 06/18/2020 1143   CALCIUM 9.8 12/20/2016 0755   ALKPHOS 78 06/18/2020 1143   ALKPHOS 56 12/20/2016 0755   AST 23 06/18/2020 1143   AST 18 12/20/2016 0755   ALT 26 06/18/2020 1143   ALT 18 12/20/2016 0755   BILITOT 0.6 06/18/2020 1143   BILITOT 0.52 12/20/2016 0755       No results found for: LABCA2  No components found for: LABCA125  No results for input(s): INR in the last 168 hours.  Urinalysis    Component Value Date/Time   COLORURINE YELLOW 05/27/2016 0853   APPEARANCEUR CLEAR 05/27/2016 0853   LABSPEC 1.010 12/28/2016 1033   PHURINE 5.5 12/28/2016 1033   GLUCOSEU NEGATIVE 12/28/2016 1033   HGBUR TRACE (A) 12/28/2016 1033   BILIRUBINUR neg 08/12/2019 1354   KETONESUR NEGATIVE 12/28/2016 1033   PROTEINUR Negative 08/12/2019 1354   PROTEINUR NEGATIVE 12/28/2016 1033   UROBILINOGEN 0.2 08/12/2019 1354   UROBILINOGEN 0.2 12/28/2016 1033   NITRITE postitive 08/12/2019 1354   NITRITE NEGATIVE 12/28/2016 1033   LEUKOCYTESUR Large (3+) (A) 08/12/2019 1354     STUDIES: ECHOCARDIOGRAM COMPLETE  Result Date: 05/21/2020    ECHOCARDIOGRAM REPORT   Patient Name:   BROOKLYNN BRANDENBURG  Date of Exam: 05/21/2020 Medical Rec #:  [id number]     Height:       62.0 in Accession #:    [id number]    Weight:       206.0 lb Date of Birth:  01-10-1951     BSA:          1.936 m Patient Age:    29 years      BP:           113/67 mmHg Patient Gender: F             HR:           63 bpm. Exam Location:  [place] Procedure: 2D Echo, 3D Echo, Cardiac Doppler and Color Doppler Indications:    I50.9 CHF   History:        Patient has prior history of Echocardiogram examinations, most                 recent 03/22/2018. CHF, Pulmonary HTN; Risk                 Factors:Hypertension, Diabetes and Dyslipidemia. Breast cancer.                 CKD stage 4. Anemia. Pulmonary embolus.  Sonographer:    Jessee Avers, RDCS Referring Phys: [place]  1. Left ventricular ejection fraction, by estimation, is 55 to 60%. Left ventricular ejection fraction by 3D volume is 58 %. The left ventricle has normal function. The left ventricle has no regional wall motion abnormalities. Left ventricular diastolic  parameters are consistent with Grade I diastolic dysfunction (impaired relaxation). Elevated left ventricular end-diastolic pressure. The average left ventricular global longitudinal strain is -19.7 %. The global longitudinal strain is normal.  2. Right ventricular systolic function is normal. The right ventricular  size is normal. There is normal pulmonary artery systolic pressure. The estimated right ventricular systolic pressure is 81.1 mmHg.  3. The mitral valve is normal in structure. Mild mitral valve regurgitation. No evidence of mitral stenosis.  4. The aortic valve is tricuspid. Aortic valve regurgitation is not visualized. Mild to moderate aortic valve sclerosis/calcification is present, without any evidence of aortic stenosis.  5. The inferior vena cava is normal in size with greater than 50% respiratory variability, suggesting right atrial pressure of 3 mmHg. Comparison(s): 03/22/18 EF 60-65%. FINDINGS  Left Ventricle: Left ventricular ejection fraction, by estimation, is 55 to 60%. Left ventricular ejection fraction by 3D volume is 58 %. The left ventricle has normal function. The left ventricle has no regional wall motion abnormalities. The average left ventricular global longitudinal strain is -19.7 %. The global longitudinal strain is normal. The left ventricular internal cavity size was normal in  size. There is no left ventricular hypertrophy. Left ventricular diastolic parameters are consistent  with Grade I diastolic dysfunction (impaired relaxation). Elevated left ventricular end-diastolic pressure. Right Ventricle: The right ventricular size is normal. No increase in right ventricular wall thickness. Right ventricular systolic function is normal. There is normal pulmonary artery systolic pressure. The tricuspid regurgitant velocity is 2.55 m/s, and  with an assumed right atrial pressure of 3 mmHg, the estimated right ventricular systolic pressure is 91.4 mmHg. Left Atrium: Left atrial size was normal in size. Right Atrium: Right atrial size was normal in size. Pericardium: There is no evidence of pericardial effusion. Mitral Valve: The mitral valve is normal in structure. Mild mitral valve regurgitation. No evidence of mitral valve stenosis. Tricuspid Valve: The tricuspid valve is normal in structure. Tricuspid valve regurgitation is trivial. No evidence of tricuspid stenosis. Aortic Valve: The aortic valve is tricuspid. Aortic valve regurgitation is not visualized. Mild to moderate aortic valve sclerosis/calcification is present, without any evidence of aortic stenosis. Pulmonic Valve: The pulmonic valve was normal in structure. Pulmonic valve regurgitation is trivial. No evidence of pulmonic stenosis. Aorta: The aortic root is normal in size and structure. Venous: The inferior vena cava is normal in size with greater than 50% respiratory variability, suggesting right atrial pressure of 3 mmHg. IAS/Shunts: No atrial level shunt detected by color flow Doppler.  LEFT VENTRICLE PLAX 2D LVIDd:         5.20 cm         Diastology LVIDs:         3.70 cm         LV e' medial:    5.93 cm/s LV PW:         1.00 cm         LV E/e' medial:  12.8 LV IVS:        0.70 cm         LV e' lateral:   5.85 cm/s LVOT diam:     2.00 cm         LV E/e' lateral: 13.0 LV SV:         69 LV SV Index:   36              2D LVOT Area:      3.14 cm        Longitudinal                                Strain  2D Strain GLS  -23.5 %                                (A2C):                                2D Strain GLS  -17.6 %                                (A3C):                                2D Strain GLS  -18.2 %                                (A4C):                                2D Strain GLS  -19.7 %                                Avg:                                 3D Volume EF                                LV 3D EF:    Left                                             ventricular                                             ejection                                             fraction by                                             3D volume                                             is 58 %.                                 3D Volume EF:  3D EF:        58 %                                LV EDV:       148 ml                                LV ESV:       61 ml                                LV SV:        86 ml RIGHT VENTRICLE RV Basal diam:  2.60 cm RV S prime:     13.20 cm/s TAPSE (M-mode): 2.7 cm RVSP:           29.0 mmHg LEFT ATRIUM             Index       RIGHT ATRIUM           Index LA diam:        3.30 cm 1.70 cm/m  RA Pressure: 3.00 mmHg LA Vol (A2C):   42.7 ml 22.06 ml/m RA Area:     11.30 cm LA Vol (A4C):   31.8 ml 16.43 ml/m RA Volume:   23.00 ml  11.88 ml/m LA Biplane Vol: 37.0 ml 19.11 ml/m  AORTIC VALVE LVOT Vmax:   87.70 cm/s LVOT Vmean:  63.000 cm/s LVOT VTI:    0.221 m  AORTA Ao Root diam: 3.40 cm Ao Asc diam:  3.50 cm MITRAL VALVE               TRICUSPID VALVE                            TR Peak grad:   26.0 mmHg                            TR Vmax:        255.00 cm/s MV E velocity: 76.00 cm/s  Estimated RAP:  3.00 mmHg MV A velocity: 84.80 cm/s  RVSP:           29.0 mmHg MV E/A ratio:  0.90                            SHUNTS                            Systemic VTI:   0.22 m                            Systemic Diam: 2.00 cm Fransico Him MD Electronically signed by Fransico Him MD Signature Date/Time: 05/21/2020/11:09:04 AM    Final       ELIGIBLE FOR AVAILABLE RESEARCH PROTOCOL: no  ASSESSMENT: 70 y.o. [place] woman originally from [place], status post left mastectomy and sentinel lymph node sampling 07/29/2015 for a pT2 pN1, stage IIB invasive breast cancer, mixed ductal and lobular, strongly estrogen and progesterone receptor positive, HER-2 negative by FISH  (1) she received adjuvant chemotherapy consisting (most likely) of cyclophosphamide and doxorubicin 4 given  between October and December 2017, then started paclitaxel 04/06/2016, discontinued after one dose because of neuropathy and other complications  (2) considered adjuvant radiation, decided against it due to delay  (3) anastrozole started 05/11/2016, to stop December 2022 (5 years)  (a) bone density 07/13/2016 normal with a T score of -0.2  (b) bone density 06/17/2019 normal with a score of -0.5  (4) pulmonary embolus and right lower extremity deep vein thrombosis diagnosed 04/24/2016,  (a) status post enoxaparin 04/24/2016 through 05/03/2016  (b) transitioned to apixaban 05/03/2016  (5) status post stroke with moderate residual   PLAN: Linell is nearly 5 years out from definitive surgery for her breast cancer with no evidence of disease recurrence.  This is very favorable.  She will complete 5 years of anastrozole this December and I was going to release her at that time.  However she tells me that she and her daughter will be moving to [place].  Accordingly today I gave her a copy of her records, and my card to contact us as needed.  She is having mammography later this month.  I wrote her a note so she can ask the breast center to give her a disc with the images as well as the readings of her most recent mammogram so that she can continue yearly mammography without interruption once  she is in [place].  We will be glad to see Marygrace again at any point in the future if and when the need arises but as of now are making no further routine appointments for her here.  Total encounter time 25 minutes.*  Dashanae Longfield, Virgie Dad, MD  06/18/20 5:09 PM Medical Oncology and Hematology [facility] [place], [place] [address] Tel. [phone]    Fax. [phone]   I, Wilburn Mylar, am acting as scribe for Dr. Virgie Dad. Allyson Tineo.  I, Lurline Del MD, have reviewed the above documentation for accuracy and completeness, and I agree with the above.   *Total Encounter Time as defined by the Centers for Medicare and Medicaid Services includes, in addition to the face-to-face time of a patient visit (documented in the note above) non-face-to-face time: obtaining and reviewing outside history, ordering and reviewing medications, tests or procedures, care coordination (communications with other health care professionals or caregivers) and documentation in the medical record.

## 2020-06-18 ENCOUNTER — Inpatient Hospital Stay: Payer: Medicare Other

## 2020-06-18 ENCOUNTER — Other Ambulatory Visit: Payer: Self-pay

## 2020-06-18 ENCOUNTER — Inpatient Hospital Stay: Payer: Medicare Other | Attending: Oncology | Admitting: Oncology

## 2020-06-18 VITALS — BP 135/85 | HR 80 | Temp 97.0°F | Resp 18 | Ht 62.0 in | Wt 208.3 lb

## 2020-06-18 DIAGNOSIS — Z170 Estrogen receptor positive status [ER+]: Secondary | ICD-10-CM | POA: Insufficient documentation

## 2020-06-18 DIAGNOSIS — C50812 Malignant neoplasm of overlapping sites of left female breast: Secondary | ICD-10-CM

## 2020-06-18 DIAGNOSIS — Z86718 Personal history of other venous thrombosis and embolism: Secondary | ICD-10-CM | POA: Insufficient documentation

## 2020-06-18 DIAGNOSIS — Z9012 Acquired absence of left breast and nipple: Secondary | ICD-10-CM | POA: Insufficient documentation

## 2020-06-18 DIAGNOSIS — Z9221 Personal history of antineoplastic chemotherapy: Secondary | ICD-10-CM | POA: Diagnosis not present

## 2020-06-18 DIAGNOSIS — Z923 Personal history of irradiation: Secondary | ICD-10-CM | POA: Diagnosis not present

## 2020-06-18 DIAGNOSIS — C779 Secondary and unspecified malignant neoplasm of lymph node, unspecified: Secondary | ICD-10-CM

## 2020-06-18 DIAGNOSIS — Z86711 Personal history of pulmonary embolism: Secondary | ICD-10-CM | POA: Diagnosis not present

## 2020-06-18 DIAGNOSIS — I2510 Atherosclerotic heart disease of native coronary artery without angina pectoris: Secondary | ICD-10-CM | POA: Diagnosis not present

## 2020-06-18 DIAGNOSIS — Z8673 Personal history of transient ischemic attack (TIA), and cerebral infarction without residual deficits: Secondary | ICD-10-CM | POA: Diagnosis not present

## 2020-06-18 DIAGNOSIS — C50912 Malignant neoplasm of unspecified site of left female breast: Secondary | ICD-10-CM

## 2020-06-18 LAB — CBC WITH DIFFERENTIAL (CANCER CENTER ONLY)
Abs Immature Granulocytes: 0.02 10*3/uL (ref 0.00–0.07)
Basophils Absolute: 0 10*3/uL (ref 0.0–0.1)
Basophils Relative: 0 %
Eosinophils Absolute: 0.4 10*3/uL (ref 0.0–0.5)
Eosinophils Relative: 7 %
HCT: 40.2 % (ref 36.0–46.0)
Hemoglobin: 13.3 g/dL (ref 12.0–15.0)
Immature Granulocytes: 0 %
Lymphocytes Relative: 35 %
Lymphs Abs: 2.1 10*3/uL (ref 0.7–4.0)
MCH: 30 pg (ref 26.0–34.0)
MCHC: 33.1 g/dL (ref 30.0–36.0)
MCV: 90.7 fL (ref 80.0–100.0)
Monocytes Absolute: 0.3 10*3/uL (ref 0.1–1.0)
Monocytes Relative: 5 %
Neutro Abs: 3.1 10*3/uL (ref 1.7–7.7)
Neutrophils Relative %: 53 %
Platelet Count: 223 10*3/uL (ref 150–400)
RBC: 4.43 MIL/uL (ref 3.87–5.11)
RDW: 13.4 % (ref 11.5–15.5)
WBC Count: 5.8 10*3/uL (ref 4.0–10.5)
nRBC: 0 % (ref 0.0–0.2)

## 2020-06-18 LAB — CMP (CANCER CENTER ONLY)
ALT: 26 U/L (ref 0–44)
AST: 23 U/L (ref 15–41)
Albumin: 3.7 g/dL (ref 3.5–5.0)
Alkaline Phosphatase: 78 U/L (ref 38–126)
Anion gap: 7 (ref 5–15)
BUN: 25 mg/dL — ABNORMAL HIGH (ref 8–23)
CO2: 25 mmol/L (ref 22–32)
Calcium: 10 mg/dL (ref 8.9–10.3)
Chloride: 106 mmol/L (ref 98–111)
Creatinine: 1.23 mg/dL — ABNORMAL HIGH (ref 0.44–1.00)
GFR, Estimated: 47 mL/min — ABNORMAL LOW (ref >60)
Glucose, Bld: 190 mg/dL — ABNORMAL HIGH (ref 70–99)
Potassium: 3.8 mmol/L (ref 3.5–5.1)
Sodium: 138 mmol/L (ref 135–145)
Total Bilirubin: 0.6 mg/dL (ref 0.3–1.2)
Total Protein: 7.7 g/dL (ref 6.5–8.1)

## 2020-06-18 MED ORDER — ANASTROZOLE 1 MG PO TABS: 1.0000 mg | ORAL_TABLET | Freq: Every day | ORAL | 4 refills | Status: DC

## 2020-06-23 DIAGNOSIS — I69320 Aphasia following cerebral infarction: Secondary | ICD-10-CM | POA: Insufficient documentation

## 2020-07-02 ENCOUNTER — Telehealth: Payer: Self-pay | Admitting: Oncology

## 2020-07-02 NOTE — Telephone Encounter (Signed)
Release: [id number] Pt requested records to be sent to [facility] @ fax [phone] for Continuity of Care

## 2020-07-08 ENCOUNTER — Other Ambulatory Visit: Payer: Self-pay | Admitting: Cardiovascular Disease

## 2020-07-08 NOTE — Telephone Encounter (Signed)
Pt takes Eliquis for history of PE, not afib. She's on the correct dose of 2.5mg  BID for prevention of recurrent VTE. Would check that pt is still following at [facility] though, current address says she lives in [place].

## 2020-07-08 NOTE — Telephone Encounter (Signed)
I did send a one month refill with note on bottle to schedule appt

## 2020-07-08 NOTE — Telephone Encounter (Signed)
5f, 94.5kg, scr 1.23 06/18/20, lovw/kelly 05/17/19  Pt requesting refill of eliquis 2.5mg  when they actually qualify for 5mg  will route to pharmd pool. The pt is overdue for office visit though

## 2020-08-26 ENCOUNTER — Other Ambulatory Visit: Payer: Self-pay | Admitting: Nurse Practitioner

## 2020-09-01 ENCOUNTER — Other Ambulatory Visit: Payer: Self-pay | Admitting: Nurse Practitioner

## 2020-09-01 DIAGNOSIS — E119 Type 2 diabetes mellitus without complications: Secondary | ICD-10-CM

## 2020-09-02 ENCOUNTER — Other Ambulatory Visit: Payer: Self-pay

## 2020-10-01 ENCOUNTER — Other Ambulatory Visit: Payer: Self-pay | Admitting: Oncology

## 2020-10-29 DIAGNOSIS — H903 Sensorineural hearing loss, bilateral: Secondary | ICD-10-CM | POA: Insufficient documentation

## 2021-02-12 ENCOUNTER — Telehealth: Payer: Self-pay | Admitting: Adult Health

## 2021-02-12 NOTE — Telephone Encounter (Signed)
Sch per 1/13 inbasket, pt aware °

## 2021-02-15 ENCOUNTER — Other Ambulatory Visit: Payer: Self-pay | Admitting: Oncology

## 2021-03-11 ENCOUNTER — Other Ambulatory Visit: Payer: Medicare Other

## 2021-03-11 ENCOUNTER — Ambulatory Visit: Payer: Medicare Other | Admitting: Oncology

## 2021-04-26 ENCOUNTER — Ambulatory Visit: Payer: Medicare Other | Admitting: Adult Health

## 2021-04-26 ENCOUNTER — Other Ambulatory Visit: Payer: Medicare Other

## 2021-04-28 ENCOUNTER — Ambulatory Visit: Payer: Medicare Other | Admitting: Adult Health

## 2021-04-28 ENCOUNTER — Other Ambulatory Visit: Payer: Medicare Other

## 2021-05-11 ENCOUNTER — Other Ambulatory Visit: Payer: Self-pay

## 2021-05-11 ENCOUNTER — Encounter: Payer: Self-pay | Admitting: Adult Health

## 2021-05-11 ENCOUNTER — Inpatient Hospital Stay (HOSPITAL_BASED_OUTPATIENT_CLINIC_OR_DEPARTMENT_OTHER): Payer: Medicare Other | Admitting: Adult Health

## 2021-05-11 ENCOUNTER — Inpatient Hospital Stay: Payer: Medicare Other | Attending: Adult Health

## 2021-05-11 VITALS — BP 148/75 | HR 70 | Temp 97.9°F | Resp 17 | Wt 211.6 lb

## 2021-05-11 DIAGNOSIS — C773 Secondary and unspecified malignant neoplasm of axilla and upper limb lymph nodes: Secondary | ICD-10-CM | POA: Insufficient documentation

## 2021-05-11 DIAGNOSIS — Z170 Estrogen receptor positive status [ER+]: Secondary | ICD-10-CM | POA: Insufficient documentation

## 2021-05-11 DIAGNOSIS — Z9012 Acquired absence of left breast and nipple: Secondary | ICD-10-CM | POA: Diagnosis not present

## 2021-05-11 DIAGNOSIS — C50812 Malignant neoplasm of overlapping sites of left female breast: Secondary | ICD-10-CM

## 2021-05-11 DIAGNOSIS — R079 Chest pain, unspecified: Secondary | ICD-10-CM | POA: Insufficient documentation

## 2021-05-11 DIAGNOSIS — C50912 Malignant neoplasm of unspecified site of left female breast: Secondary | ICD-10-CM | POA: Insufficient documentation

## 2021-05-11 DIAGNOSIS — C50112 Malignant neoplasm of central portion of left female breast: Secondary | ICD-10-CM

## 2021-05-11 LAB — CMP (CANCER CENTER ONLY)
ALT: 18 U/L (ref 0–44)
AST: 19 U/L (ref 15–41)
Albumin: 3.8 g/dL (ref 3.5–5.0)
Alkaline Phosphatase: 62 U/L (ref 38–126)
Anion gap: 6 (ref 5–15)
BUN: 28 mg/dL — ABNORMAL HIGH (ref 8–23)
CO2: 26 mmol/L (ref 22–32)
Calcium: 9.4 mg/dL (ref 8.9–10.3)
Chloride: 109 mmol/L (ref 98–111)
Creatinine: 1.24 mg/dL — ABNORMAL HIGH (ref 0.44–1.00)
GFR, Estimated: 46 mL/min — ABNORMAL LOW (ref >60)
Glucose, Bld: 259 mg/dL — ABNORMAL HIGH (ref 70–99)
Potassium: 4.3 mmol/L (ref 3.5–5.1)
Sodium: 141 mmol/L (ref 135–145)
Total Bilirubin: 0.4 mg/dL (ref 0.3–1.2)
Total Protein: 6.9 g/dL (ref 6.5–8.1)

## 2021-05-11 LAB — CBC WITH DIFFERENTIAL (CANCER CENTER ONLY)
Abs Immature Granulocytes: 0.02 10*3/uL (ref 0.00–0.07)
Basophils Absolute: 0 10*3/uL (ref 0.0–0.1)
Basophils Relative: 1 %
Eosinophils Absolute: 0.3 10*3/uL (ref 0.0–0.5)
Eosinophils Relative: 5 %
HCT: 36.2 % (ref 36.0–46.0)
Hemoglobin: 11.9 g/dL — ABNORMAL LOW (ref 12.0–15.0)
Immature Granulocytes: 0 %
Lymphocytes Relative: 37 %
Lymphs Abs: 2.3 10*3/uL (ref 0.7–4.0)
MCH: 30.4 pg (ref 26.0–34.0)
MCHC: 32.9 g/dL (ref 30.0–36.0)
MCV: 92.6 fL (ref 80.0–100.0)
Monocytes Absolute: 0.4 10*3/uL (ref 0.1–1.0)
Monocytes Relative: 6 %
Neutro Abs: 3.1 10*3/uL (ref 1.7–7.7)
Neutrophils Relative %: 51 %
Platelet Count: 216 10*3/uL (ref 150–400)
RBC: 3.91 MIL/uL (ref 3.87–5.11)
RDW: 13.2 % (ref 11.5–15.5)
WBC Count: 6.1 10*3/uL (ref 4.0–10.5)
nRBC: 0 % (ref 0.0–0.2)

## 2021-05-11 NOTE — Progress Notes (Signed)
[place]  [phone]) [phone] [phone]     ID: Carrie Watts DOB: 03/29/1951  MR#: [id number]  DHR#:[id number]  Patient Care Team: Vevelyn Francois, NP as PCP - General (Adult Health Nurse Practitioner) Troy Sine, MD as PCP - Cardiology (Cardiology) Magrinat, Virgie Dad, MD as Consulting Physician (Oncology) OTHER MD:  CHIEF COMPLAINT: Estrogen receptor positive breast cancer (s/p left mastectomy)  CURRENT TREATMENT: Anastrozole   INTERVAL HISTORY: Carrie Watts returns today for follow-up and second opinion about the recommendation she received from oncology in [place].  She notes that she was told by Dr. Jana Hakim who retired in December that she could stop taking the anastrozole after 5 years.  She moved to [place] with her daughter and was seen by oncology in [place] who recommended BCI testing.  Unfortunately they were not able to acquire the sample from [place] where she underwent surgery to be able to perform the BCI testing so she was recommended to continue on anastrozole for 10 years time.  Carrie Watts notes that she is uncomfortable with side effects she is having from her medications.  She notes that her dosages have been adjusted and that she does not feel well.  She notes that some mornings she is very dizzy.  Today she also informed me that she woke up at 2 AM with substernal chest pain and pressure.  She took omeprazole which did not alleviate this.  She denies any shortness of breath diaphoresis dizziness headaches.  Her blood pressure is slightly elevated today at 148/75 her oxygen saturation is 100% and her heart rate is 70 and regular.  She says that she has a history of heart attacks, stroke, and is concerned about flying home on the plane to [place] and dying.    REVIEW OF SYSTEMS: Review of Systems  Constitutional:  Positive for fatigue. Negative for appetite change, chills, fever and unexpected weight change.  HENT:   Negative for hearing  loss, lump/mass and trouble swallowing.   Eyes:  Negative for eye problems and icterus.  Respiratory:  Negative for chest tightness, cough and shortness of breath.   Cardiovascular:  Positive for chest pain. Negative for leg swelling and palpitations.  Gastrointestinal:  Negative for abdominal distention, abdominal pain, constipation, diarrhea, nausea and vomiting.  Endocrine: Negative for hot flashes.  Genitourinary:  Negative for difficulty urinating.   Musculoskeletal:  Positive for arthralgias.  Skin:  Negative for itching and rash.  Neurological:  Positive for extremity weakness (Residual from previous stroke). Negative for dizziness, headaches and numbness.  Hematological:  Negative for adenopathy. Does not bruise/bleed easily.  Psychiatric/Behavioral:  Negative for depression. The patient is not nervous/anxious.      COVID 19 VACCINATION STATUS:    BREAST CANCER HISTORY: From the original intake note:  The patient tells me she herself palpated a mass in her left breast and brought it to medical attention. She underwent mammography, ultrasonography, and biopsy and then proceeded to left mastectomy and axillary lymph node dissection at [facility] in [place]. The pathology from this procedure ([id number]) describes a 2.1 cm tumor, grade 2, mixed infiltrating lobular and ductal, involving multiple quadrants. The deep margin was clear. 2 of 15 lymph nodes were involved.  The patient was then treated with what I her report seems to have been doxorubicin and cyclophosphamide given every 3 weeks 4, beginning October and completed December, at which point she was started on what sounds like paclitaxel weekly. According to her  account she received 1 dose of paclitaxel 04/06/2016. At that time she moved to this area to be closer to her daughter and to receive further treatment.  Of note, on 04/24/2016 she presented to the emergency room with chest pain and shortness of  breath, with a CT angiogram on that date showing a saddle embolus with bilateral lobe wall and segmental pulmonary emboli. Doppler ultrasonography the next day documented a right lower extremity deep venous thrombosis. The patient was started on Lovenox and transitioned to apixaban   Her subsequent history is as detailed below   PAST MEDICAL HISTORY: Past Medical History:  Diagnosis Date   Angina of effort [facility])    Atrial fibrillation ([place])    Cancer ([place])    breast   CHF (congestive heart failure) ([place])    Deep venous thrombosis ([place])    Diabetes mellitus without complication ([place])    Hypercholesterolemia    Hypertension    Pulmonary embolism ([place])    Renal disorder    Stroke [facility])    "a few strokes, first one Dec 2017, next in 2018"    PAST SURGICAL HISTORY: Past Surgical History:  Procedure Laterality Date   BREAST SURGERY     COLONOSCOPY N/A 05/16/2016   Procedure: COLONOSCOPY;  Surgeon: [name], MD;  Location: [place];  Service: Endoscopy;  Laterality: N/A;   ESOPHAGOGASTRODUODENOSCOPY N/A 05/16/2016   Procedure: ESOPHAGOGASTRODUODENOSCOPY (EGD);  Surgeon: [name], MD;  Location: [facility];  Service: Endoscopy;  Laterality: N/A;   LEFT HEART CATH AND CORONARY ANGIOGRAPHY N/A 05/18/2016   Procedure: Left Heart Cath and Coronary Angiography;  Surgeon: Jettie Booze, MD;  Location: [facility] CV LAB;  Service: Cardiovascular;  Laterality: N/A;   MASTECTOMY Left 2017   PARTIAL HYSTERECTOMY      FAMILY HISTORY Family History  Problem Relation Age of Onset   Heart attack Mother    Clotting disorder Neg Hx   The patient's father died from colorectal cancer at age 82. The patient's mother died at age 97 from heart disease. The patient has 3 brothers, 1 sister. The sister was diagnosed with breast cancer in her late 68s. The patient has a niece diagnosed with breast cancer at age 64.   GYNECOLOGIC HISTORY:  No LMP recorded. Patient has had a  hysterectomy. Menarche age 74, first live birth age 25. The patient is GX P5. She status post menopause but does not recall the date. She did not take hormone replacement. She never used oral contraceptives.   SOCIAL HISTORY:  The patient used to do gardening. She is originally from [place]. She is divorced. She is currently living with her daughter Carrie Watts here in [place]. [name] is an Therapist, sports. The patient's other children are either in [place] or [place]. The patient has 8 grandchildren. She is a Nurse, learning disability.    ADVANCED DIRECTIVES: In place   HEALTH MAINTENANCE: Social History   Tobacco Use   Smoking status: Never   Smokeless tobacco: Never  Vaping Use   Vaping Use: Never used  Substance Use Topics   Alcohol use: Yes    Comment: holidays   Drug use: No     Colonoscopy: Never  PAP:  Bone density: Never   Allergies  Allergen Reactions   Dust Mite Extract     Current Outpatient Medications  Medication Sig Dispense Refill   acetaminophen (TYLENOL) 500 MG tablet Take 1 tablet (500 mg total) by mouth every 6 (six) hours as needed. 30 tablet 0  anastrozole (ARIMIDEX) 1 MG tablet TAKE 1 TABLET(1 MG) BY MOUTH DAILY 90 tablet 4   apixaban (ELIQUIS) 2.5 MG TABS tablet Take 1 tablet (2.5 mg total) by mouth 2 (two) times daily. Please make an appointment with cardiologist 60 tablet 0   atorvastatin (LIPITOR) 20 MG tablet TAKE 1 TABLET(20 MG) BY MOUTH DAILY 90 tablet 3   carvedilol (COREG) 6.25 MG tablet TAKE 1 TABLET(6.25 MG) BY MOUTH TWICE DAILY WITH A MEAL 60 tablet 0   docusate sodium (COLACE) 100 MG capsule Take 100 mg by mouth daily as needed for mild constipation.     folic acid (FOLVITE) 800 MCG tablet Take 800 mcg by mouth every morning.      glucose monitoring kit (FREESTYLE) monitoring kit 1 each by Does not apply route 4 (four) times daily - after meals and at bedtime. 1 month Diabetic Testing Supplies for QAC-QHS accuchecks.  Diagnosis E11.65. Any brand OK 1 each  1   hydrochlorothiazide (MICROZIDE) 12.5 MG capsule TAKE 1 CAPSULE(12.5 MG) BY MOUTH DAILY 90 capsule 3   losartan (COZAAR) 100 MG tablet TAKE 1 TABLET(100 MG) BY MOUTH DAILY 90 tablet 3   metFORMIN (GLUCOPHAGE) 500 MG tablet TAKE 1 TABLET BY MOUTH WITH BREAKFAST 90 tablet 1   montelukast (SINGULAIR) 10 MG tablet TAKE 1 TABLET(10 MG) BY MOUTH AT BEDTIME 30 tablet 3   Multiple Vitamin (MULTIVITAMIN WITH MINERALS) TABS tablet Take 1 tablet by mouth every morning.      ondansetron (ZOFRAN) 4 MG tablet Take 1 tablet (4 mg total) by mouth every 8 (eight) hours as needed for nausea or vomiting. 30 tablet 1   TRUE METRIX BLOOD GLUCOSE TEST test strip USE AS DIRECTED FOUR TIMES DAILY AFTER MEALS AND AT BEDTIME. 100 strip 4   TRUEplus Lancets 28G MISC USE TO TEST BLOOD SUGAR FOUR TIMES DAILY 300 each 3   No current facility-administered medications for this visit.    OBJECTIVE:  African-American woman who appears stated age  Vitals:   05/11/21 0930  BP: (!) 148/75  Pulse: 70  Resp: 17  Temp: 97.9 F (36.6 C)  SpO2: 100%     Body mass index is 38.7 kg/m.    ECOG FS:2 - Symptomatic, <50% confined to bed GENERAL: Patient is a well appearing female in no acute distress HEENT:  Sclerae anicteric.  Mask in place neck is supple.  NODES:  No cervical, supraclavicular, or axillary lymphadenopathy palpated.  BREAST EXAM:  Deferred. LUNGS:  Clear to auscultation bilaterally.  No wheezes or rhonchi. HEART:  Regular rate and rhythm. No murmur appreciated. ABDOMEN:  Soft, nontender.  Positive, normoactive bowel sounds. No organomegaly palpated. MSK:  No focal spinal tenderness to palpation.  EXTREMITIES:  No peripheral edema.   SKIN:  Clear with no obvious rashes or skin changes. No nail dyscrasia. NEURO:  Nonfocal. Well oriented.  Appropriate affect.  Alert and oriented x4   LAB RESULTS:  CMP     Component Value Date/Time   NA 138 06/18/2020 1143   NA 140 05/13/2019 1518   NA 141 12/20/2016  0755   K 3.8 06/18/2020 1143   K 4.4 12/20/2016 0755   CL 106 06/18/2020 1143   CO2 25 06/18/2020 1143   CO2 24 12/20/2016 0755   GLUCOSE 190 (H) 06/18/2020 1143   GLUCOSE 140 12/20/2016 0755   BUN 25 (H) 06/18/2020 1143   BUN 24 05/13/2019 1518   BUN 17.0 12/20/2016 0755   CREATININE 1.23 (H) 06/18/2020 1143  CREATININE 1.1 12/20/2016 0755   CALCIUM 10.0 06/18/2020 1143   CALCIUM 9.8 12/20/2016 0755   PROT 7.7 06/18/2020 1143   PROT 7.1 05/13/2019 1518   PROT 6.9 12/20/2016 0755   ALBUMIN 3.7 06/18/2020 1143   ALBUMIN 4.3 05/13/2019 1518   ALBUMIN 3.5 12/20/2016 0755   AST 23 06/18/2020 1143   AST 18 12/20/2016 0755   ALT 26 06/18/2020 1143   ALT 18 12/20/2016 0755   ALKPHOS 78 06/18/2020 1143   ALKPHOS 56 12/20/2016 0755   BILITOT 0.6 06/18/2020 1143   BILITOT 0.52 12/20/2016 0755   GFRNONAA 47 (L) 06/18/2020 1143   GFRNONAA 52 (L) 09/07/2016 1021   GFRAA 47 (L) 05/21/2019 0949   GFRAA 59 (L) 09/07/2016 1021    INo results found for: SPEP, UPEP  Lab Results  Component Value Date   WBC 6.1 05/11/2021   NEUTROABS 3.1 05/11/2021   HGB 11.9 (L) 05/11/2021   HCT 36.2 05/11/2021   MCV 92.6 05/11/2021   PLT 216 05/11/2021      Chemistry      Component Value Date/Time   NA 138 06/18/2020 1143   NA 140 05/13/2019 1518   NA 141 12/20/2016 0755   K 3.8 06/18/2020 1143   K 4.4 12/20/2016 0755   CL 106 06/18/2020 1143   CO2 25 06/18/2020 1143   CO2 24 12/20/2016 0755   BUN 25 (H) 06/18/2020 1143   BUN 24 05/13/2019 1518   BUN 17.0 12/20/2016 0755   CREATININE 1.23 (H) 06/18/2020 1143   CREATININE 1.1 12/20/2016 0755      Component Value Date/Time   CALCIUM 10.0 06/18/2020 1143   CALCIUM 9.8 12/20/2016 0755   ALKPHOS 78 06/18/2020 1143   ALKPHOS 56 12/20/2016 0755   AST 23 06/18/2020 1143   AST 18 12/20/2016 0755   ALT 26 06/18/2020 1143   ALT 18 12/20/2016 0755   BILITOT 0.6 06/18/2020 1143   BILITOT 0.52 12/20/2016 0755       No results found for:  LABCA2  No components found for: LABCA125  No results for input(s): INR in the last 168 hours.  Urinalysis    Component Value Date/Time   COLORURINE YELLOW 05/27/2016 0853   APPEARANCEUR CLEAR 05/27/2016 0853   LABSPEC 1.010 12/28/2016 1033   PHURINE 5.5 12/28/2016 1033   GLUCOSEU NEGATIVE 12/28/2016 1033   HGBUR TRACE (A) 12/28/2016 1033   BILIRUBINUR neg 08/12/2019 1354   KETONESUR NEGATIVE 12/28/2016 1033   PROTEINUR Negative 08/12/2019 1354   PROTEINUR NEGATIVE 12/28/2016 1033   UROBILINOGEN 0.2 08/12/2019 1354   UROBILINOGEN 0.2 12/28/2016 1033   NITRITE postitive 08/12/2019 1354   NITRITE NEGATIVE 12/28/2016 1033   LEUKOCYTESUR Large (3+) (A) 08/12/2019 1354     STUDIES: No results found.     ELIGIBLE FOR AVAILABLE RESEARCH PROTOCOL: no  ASSESSMENT: 71 y.o. [place] woman originally from [place], status post left mastectomy and sentinel lymph node sampling 07/29/2015 for a pT2 pN1, stage IIB invasive breast cancer, mixed ductal and lobular, strongly estrogen and progesterone receptor positive, HER-2 negative by FISH  (1) she received adjuvant chemotherapy consisting (most likely) of cyclophosphamide and doxorubicin 4 given between October and December 2017, then started paclitaxel 04/06/2016, discontinued after one dose because of neuropathy and other complications  (2) considered adjuvant radiation, decided against it due to delay  (3) anastrozole started 05/11/2016, to stop December 2022 (5 years)  (a) bone density 07/13/2016 normal with a T score of -0.2  (b) bone density 06/17/2019  normal with a score of -0.5  (4) pulmonary embolus and right lower extremity deep vein thrombosis diagnosed 04/24/2016,  (a) status post enoxaparin 04/24/2016 through 05/03/2016  (b) transitioned to apixaban 05/03/2016  (5) status post stroke with moderate residual deficit and speech impairment.   PLAN: Adrianne and I reviewed the information about the BCI testing.  I let her  know that 95% of women who take antiestrogen therapy as part of their breast cancer treatment get all the benefit in 5 years of adjuvant therapy.  That means that 5% of patients get the benefit from extended therapy.  The purpose of the test is to identify who is in that 5% that way only patients who will derive benefit from extended adjuvant endocrine therapy will receive it thus decreasing the risk of side effects for no gain.  I gave Ameliah a handout on the BCI testing which explained it in detail.  Mickel Baas my nurse call pathology to ensure that they did not have any samples of and as that the BCI testing could be sent from.  They did not.  Mickel Baas reviewed with Dariya the note from the cancer center in [place] that stated they were unable to obtain the pathology from [place] to be able to send the BCI testing therefore they recommended 10 years of adjuvant antiestrogen therapy with anastrozole.  Emslee and I discussed whether or not she should continue the anastrozole in detail.  I suggested that since she is having increased side effects and is unsure what medication is causing them, she could stop taking the anastrozole for 2 weeks to see if they are related to the anastrozole.  I recommended that she follow-up with her new oncologist in [place] to discuss this plan further and follow-up about her concerns for continued antiestrogen therapy.  Avonell is having chest pain.  I let her know that there is no way that I can evaluate whether or not her chest pain is related to a heart attack versus reflux safely in the office.  Considering her history of previous heart attacks and stroke I recommended that she go to the emergency room for evaluation.  She did not want to go to the emergency room, however I continued to recommend this to her and my nurse did as well as she brought her out from the visit.  I told Ithzel we be happy to see her again in the future if needed, however she was recommended to follow-up with her  new providers in [place] about her care and their recommendations regarding medication changes and their side effects.  Total encounter time 30 minutes.*In face-to-face visit time, chart review, lab review, care coordination, and documentation of the encounter.  Wilber Bihari, NP 05/11/21 9:32 AM Medical Oncology and Hematology [facility], [place] [address] Tel. [phone]    Fax. [phone]   *Total Encounter Time as defined by the Centers for Medicare and Medicaid Services includes, in addition to the face-to-face time of a patient visit (documented in the note above) non-face-to-face time: obtaining and reviewing outside history, ordering and reviewing medications, tests or procedures, care coordination (communications with other health care professionals or caregivers) and documentation in the medical record.
